# Patient Record
Sex: Female | Born: 1960
Health system: Southern US, Community
[De-identification: ages and names within clinical notes are randomized; demographics above are authoritative.]

## PROBLEM LIST (undated history)

## (undated) DIAGNOSIS — I10 Essential (primary) hypertension: Secondary | ICD-10-CM

## (undated) DIAGNOSIS — M1990 Unspecified osteoarthritis, unspecified site: Secondary | ICD-10-CM

## (undated) DIAGNOSIS — IMO0001 Reserved for inherently not codable concepts without codable children: Secondary | ICD-10-CM

## (undated) DIAGNOSIS — Z8489 Family history of other specified conditions: Secondary | ICD-10-CM

## (undated) DIAGNOSIS — K219 Gastro-esophageal reflux disease without esophagitis: Secondary | ICD-10-CM

## (undated) DIAGNOSIS — K759 Inflammatory liver disease, unspecified: Secondary | ICD-10-CM

## (undated) DIAGNOSIS — C801 Malignant (primary) neoplasm, unspecified: Secondary | ICD-10-CM

## (undated) DIAGNOSIS — J189 Pneumonia, unspecified organism: Secondary | ICD-10-CM

## (undated) DIAGNOSIS — K449 Diaphragmatic hernia without obstruction or gangrene: Secondary | ICD-10-CM

## (undated) DIAGNOSIS — E039 Hypothyroidism, unspecified: Secondary | ICD-10-CM

## (undated) DIAGNOSIS — E785 Hyperlipidemia, unspecified: Secondary | ICD-10-CM

## (undated) DIAGNOSIS — M358 Other specified systemic involvement of connective tissue: Secondary | ICD-10-CM

## (undated) DIAGNOSIS — M797 Fibromyalgia: Secondary | ICD-10-CM

## (undated) DIAGNOSIS — B20 Human immunodeficiency virus [HIV] disease: Secondary | ICD-10-CM

## (undated) DIAGNOSIS — M549 Dorsalgia, unspecified: Secondary | ICD-10-CM

## (undated) HISTORY — DX: Gastro-esophageal reflux disease without esophagitis: K21.9

## (undated) HISTORY — DX: Essential (primary) hypertension: I10

## (undated) HISTORY — PX: PILONIDAL CYST EXCISION: SHX744

## (undated) HISTORY — DX: Diaphragmatic hernia without obstruction or gangrene: K44.9

## (undated) HISTORY — PX: UPPER GASTROINTESTINAL ENDOSCOPY: SHX188

## (undated) HISTORY — PX: TONSILLECTOMY: SUR1361

## (undated) HISTORY — PX: VAGINAL HYSTERECTOMY: SUR661

## (undated) HISTORY — DX: Unspecified osteoarthritis, unspecified site: M19.90

## (undated) HISTORY — DX: Malignant (primary) neoplasm, unspecified: C80.1

## (undated) HISTORY — PX: KNEE SURGERY: SHX244

## (undated) HISTORY — PX: THYROIDECTOMY: SHX17

## (undated) HISTORY — DX: Hyperlipidemia, unspecified: E78.5

## (undated) HISTORY — PX: TOTAL KNEE ARTHROPLASTY: SHX125

---

## 2000-06-07 ENCOUNTER — Other Ambulatory Visit: Admission: RE | Admit: 2000-06-07 | Discharge: 2000-06-07 | Payer: Self-pay | Admitting: Obstetrics & Gynecology

## 2000-06-13 ENCOUNTER — Ambulatory Visit (HOSPITAL_COMMUNITY): Admission: RE | Admit: 2000-06-13 | Discharge: 2000-06-13 | Payer: Self-pay | Admitting: Diagnostic Radiology

## 2000-06-13 ENCOUNTER — Encounter: Payer: Self-pay | Admitting: Diagnostic Radiology

## 2000-06-17 ENCOUNTER — Ambulatory Visit (HOSPITAL_COMMUNITY): Admission: RE | Admit: 2000-06-17 | Discharge: 2000-06-17 | Payer: Self-pay | Admitting: Diagnostic Radiology

## 2001-01-08 ENCOUNTER — Emergency Department (HOSPITAL_COMMUNITY): Admission: EM | Admit: 2001-01-08 | Discharge: 2001-01-08 | Payer: Self-pay | Admitting: Emergency Medicine

## 2001-01-10 ENCOUNTER — Emergency Department (HOSPITAL_COMMUNITY): Admission: EM | Admit: 2001-01-10 | Discharge: 2001-01-10 | Payer: Self-pay | Admitting: Emergency Medicine

## 2001-01-14 ENCOUNTER — Emergency Department (HOSPITAL_COMMUNITY): Admission: EM | Admit: 2001-01-14 | Discharge: 2001-01-14 | Payer: Self-pay | Admitting: Emergency Medicine

## 2001-02-13 ENCOUNTER — Encounter (INDEPENDENT_AMBULATORY_CARE_PROVIDER_SITE_OTHER): Payer: Self-pay | Admitting: *Deleted

## 2001-02-13 ENCOUNTER — Ambulatory Visit (HOSPITAL_BASED_OUTPATIENT_CLINIC_OR_DEPARTMENT_OTHER): Admission: RE | Admit: 2001-02-13 | Discharge: 2001-02-13 | Payer: Self-pay | Admitting: General Surgery

## 2001-04-15 ENCOUNTER — Encounter: Payer: Self-pay | Admitting: Emergency Medicine

## 2001-04-15 ENCOUNTER — Emergency Department (HOSPITAL_COMMUNITY): Admission: EM | Admit: 2001-04-15 | Discharge: 2001-04-15 | Payer: Self-pay | Admitting: Emergency Medicine

## 2001-08-06 ENCOUNTER — Encounter: Payer: Self-pay | Admitting: Emergency Medicine

## 2001-08-06 ENCOUNTER — Emergency Department (HOSPITAL_COMMUNITY): Admission: EM | Admit: 2001-08-06 | Discharge: 2001-08-06 | Payer: Self-pay | Admitting: Emergency Medicine

## 2001-09-27 ENCOUNTER — Other Ambulatory Visit: Admission: RE | Admit: 2001-09-27 | Discharge: 2001-09-27 | Payer: Self-pay | Admitting: Obstetrics & Gynecology

## 2002-01-13 ENCOUNTER — Emergency Department (HOSPITAL_COMMUNITY): Admission: EM | Admit: 2002-01-13 | Discharge: 2002-01-13 | Payer: Self-pay | Admitting: Emergency Medicine

## 2002-06-19 ENCOUNTER — Encounter: Admission: RE | Admit: 2002-06-19 | Discharge: 2002-06-19 | Payer: Self-pay | Admitting: Otolaryngology

## 2002-06-19 ENCOUNTER — Encounter: Payer: Self-pay | Admitting: Otolaryngology

## 2003-01-28 ENCOUNTER — Encounter: Payer: Self-pay | Admitting: Internal Medicine

## 2003-01-28 ENCOUNTER — Inpatient Hospital Stay (HOSPITAL_COMMUNITY): Admission: EM | Admit: 2003-01-28 | Discharge: 2003-01-29 | Payer: Self-pay

## 2003-01-29 ENCOUNTER — Encounter: Payer: Self-pay | Admitting: Cardiology

## 2004-01-09 ENCOUNTER — Other Ambulatory Visit: Admission: RE | Admit: 2004-01-09 | Discharge: 2004-01-09 | Payer: Self-pay | Admitting: Obstetrics and Gynecology

## 2004-01-14 ENCOUNTER — Encounter: Admission: RE | Admit: 2004-01-14 | Discharge: 2004-01-14 | Payer: Self-pay | Admitting: Obstetrics and Gynecology

## 2005-01-28 ENCOUNTER — Other Ambulatory Visit: Admission: RE | Admit: 2005-01-28 | Discharge: 2005-01-28 | Payer: Self-pay | Admitting: Obstetrics and Gynecology

## 2005-07-20 ENCOUNTER — Encounter: Admission: RE | Admit: 2005-07-20 | Discharge: 2005-07-20 | Payer: Self-pay | Admitting: Sports Medicine

## 2006-04-21 ENCOUNTER — Other Ambulatory Visit: Admission: RE | Admit: 2006-04-21 | Discharge: 2006-04-21 | Payer: Self-pay | Admitting: Obstetrics and Gynecology

## 2006-05-05 ENCOUNTER — Encounter: Admission: RE | Admit: 2006-05-05 | Discharge: 2006-05-05 | Payer: Self-pay | Admitting: Obstetrics and Gynecology

## 2006-05-26 ENCOUNTER — Encounter: Admission: RE | Admit: 2006-05-26 | Discharge: 2006-05-26 | Payer: Self-pay | Admitting: Sports Medicine

## 2006-09-13 ENCOUNTER — Encounter: Admission: RE | Admit: 2006-09-13 | Discharge: 2006-09-13 | Payer: Self-pay | Admitting: Sports Medicine

## 2007-06-27 ENCOUNTER — Encounter: Admission: RE | Admit: 2007-06-27 | Discharge: 2007-06-27 | Payer: Self-pay | Admitting: Internal Medicine

## 2007-07-06 ENCOUNTER — Emergency Department (HOSPITAL_COMMUNITY): Admission: EM | Admit: 2007-07-06 | Discharge: 2007-07-06 | Payer: Self-pay | Admitting: Emergency Medicine

## 2007-07-17 ENCOUNTER — Encounter: Admission: RE | Admit: 2007-07-17 | Discharge: 2007-08-25 | Payer: Self-pay | Admitting: Chiropractic Medicine

## 2007-08-11 ENCOUNTER — Encounter: Admission: RE | Admit: 2007-08-11 | Discharge: 2007-08-11 | Payer: Self-pay | Admitting: Sports Medicine

## 2007-08-22 ENCOUNTER — Encounter: Admission: RE | Admit: 2007-08-22 | Discharge: 2007-08-22 | Payer: Self-pay | Admitting: Sports Medicine

## 2007-08-24 ENCOUNTER — Encounter: Admission: RE | Admit: 2007-08-24 | Discharge: 2007-08-24 | Payer: Self-pay | Admitting: Sports Medicine

## 2008-01-17 ENCOUNTER — Encounter: Admission: RE | Admit: 2008-01-17 | Discharge: 2008-01-17 | Payer: Self-pay | Admitting: Sports Medicine

## 2008-01-29 ENCOUNTER — Emergency Department (HOSPITAL_COMMUNITY): Admission: EM | Admit: 2008-01-29 | Discharge: 2008-01-29 | Payer: Self-pay | Admitting: Emergency Medicine

## 2008-05-23 DIAGNOSIS — M1990 Unspecified osteoarthritis, unspecified site: Secondary | ICD-10-CM | POA: Insufficient documentation

## 2008-07-17 ENCOUNTER — Encounter: Admission: RE | Admit: 2008-07-17 | Discharge: 2008-07-17 | Payer: Self-pay | Admitting: Internal Medicine

## 2008-07-31 ENCOUNTER — Ambulatory Visit (HOSPITAL_COMMUNITY): Admission: RE | Admit: 2008-07-31 | Discharge: 2008-07-31 | Payer: Self-pay | Admitting: Internal Medicine

## 2009-07-22 ENCOUNTER — Encounter: Admission: RE | Admit: 2009-07-22 | Discharge: 2009-07-22 | Payer: Self-pay | Admitting: Sports Medicine

## 2009-07-28 ENCOUNTER — Encounter: Admission: RE | Admit: 2009-07-28 | Discharge: 2009-07-28 | Payer: Self-pay | Admitting: Internal Medicine

## 2009-08-28 ENCOUNTER — Encounter: Admission: RE | Admit: 2009-08-28 | Discharge: 2009-08-28 | Payer: Self-pay | Admitting: Sports Medicine

## 2009-09-10 ENCOUNTER — Ambulatory Visit (HOSPITAL_COMMUNITY): Admission: RE | Admit: 2009-09-10 | Discharge: 2009-09-10 | Payer: Self-pay | Admitting: Internal Medicine

## 2009-10-04 ENCOUNTER — Emergency Department (HOSPITAL_COMMUNITY): Admission: EM | Admit: 2009-10-04 | Discharge: 2009-10-05 | Payer: Self-pay | Admitting: Emergency Medicine

## 2010-02-23 ENCOUNTER — Emergency Department (HOSPITAL_COMMUNITY): Admission: EM | Admit: 2010-02-23 | Discharge: 2010-02-23 | Payer: Self-pay | Admitting: Emergency Medicine

## 2010-08-25 ENCOUNTER — Encounter
Admission: RE | Admit: 2010-08-25 | Discharge: 2010-08-25 | Payer: Self-pay | Source: Home / Self Care | Attending: Internal Medicine | Admitting: Internal Medicine

## 2010-09-08 ENCOUNTER — Ambulatory Visit (HOSPITAL_COMMUNITY)
Admission: RE | Admit: 2010-09-08 | Discharge: 2010-09-08 | Payer: Self-pay | Source: Home / Self Care | Attending: Internal Medicine | Admitting: Internal Medicine

## 2010-09-27 ENCOUNTER — Encounter: Payer: Self-pay | Admitting: Sports Medicine

## 2010-11-04 ENCOUNTER — Other Ambulatory Visit: Payer: Self-pay | Admitting: Sports Medicine

## 2010-11-04 DIAGNOSIS — M7120 Synovial cyst of popliteal space [Baker], unspecified knee: Secondary | ICD-10-CM

## 2010-11-10 ENCOUNTER — Ambulatory Visit
Admission: RE | Admit: 2010-11-10 | Discharge: 2010-11-10 | Disposition: A | Discharge status: Home / Self Care | Payer: Medicare Other | Source: Ambulatory Visit | Attending: Sports Medicine | Admitting: Sports Medicine

## 2010-11-10 DIAGNOSIS — M7120 Synovial cyst of popliteal space [Baker], unspecified knee: Secondary | ICD-10-CM

## 2010-11-22 LAB — POCT I-STAT, CHEM 8
BUN: 16 mg/dL (ref 6–23)
Hemoglobin: 13.6 g/dL (ref 12.0–15.0)
Potassium: 3.8 mEq/L (ref 3.5–5.1)
Sodium: 141 mEq/L (ref 135–145)
TCO2: 31 mmol/L (ref 0–100)

## 2010-11-22 LAB — CBC
HCT: 37.8 % (ref 36.0–46.0)
Platelets: 234 10*3/uL (ref 150–400)
RDW: 13.8 % (ref 11.5–15.5)

## 2010-11-22 LAB — DIFFERENTIAL
Basophils Absolute: 0 10*3/uL (ref 0.0–0.1)
Eosinophils Absolute: 0.1 10*3/uL (ref 0.0–0.7)
Eosinophils Relative: 1 % (ref 0–5)
Lymphocytes Relative: 50 % — ABNORMAL HIGH (ref 12–46)
Neutrophils Relative %: 44 % (ref 43–77)

## 2010-11-22 LAB — COMPREHENSIVE METABOLIC PANEL
ALT: 26 U/L (ref 0–35)
AST: 24 U/L (ref 0–37)
Alkaline Phosphatase: 61 U/L (ref 39–117)
CO2: 28 mEq/L (ref 19–32)
Calcium: 9.8 mg/dL (ref 8.4–10.5)
Chloride: 101 mEq/L (ref 96–112)
GFR calc Af Amer: >60 mL/min (ref >60)
GFR calc non Af Amer: >60 mL/min (ref >60)
Glucose, Bld: 91 mg/dL (ref 70–99)
Potassium: 3.9 mEq/L (ref 3.5–5.1)
Sodium: 138 mEq/L (ref 135–145)

## 2010-11-22 LAB — POCT CARDIAC MARKERS: Myoglobin, poc: 50.3 ng/mL (ref 12–200)

## 2011-01-22 NOTE — Consult Note (Signed)
NAME:  Autumn Frye, WOS NO.:  [id number]   MEDICAL RECORD NO.:  [id number]                   PATIENT TYPE:  EMS   LOCATION:  MAJO                                 FACILITY:  [facility]   PHYSICIAN:  Creta Levin, M.D. [facility]      DATE OF BIRTH:  1961/04/24   DATE OF CONSULTATION:  DATE OF DISCHARGE:                                   CONSULTATION   CHIEF COMPLAINT:  Chest pain.   REASON FOR CONSULTATION:  A stress test.   HISTORY OF PRESENT ILLNESS:  Ms. Selvey is a 50 year old woman with  gastroesophageal reflux disease and a hiatal hernia, who complains of  substernal chest pain that radiated to the left jaw and shoulder.  Her  symptoms began approximately one day prior to admission and persisted.  She  describes a moderate amount of chest pressure that did not worsen with  exertion but seemed to get better with rest.  She states that she felt a  slight pressure in her jaw and also a bit of a hot sensation in the left  shoulder.  She had no dyspnea or diaphoresis.  She did state that she felt,  hot.  She did not have fever or coughing.  There was no positional  component to her chest discomfort.  She had no orthopnea or PND, but did  complain of occasional edema, no worse at this time than in the past.   PAST MEDICAL HISTORY:  1. Significant for gastroesophageal reflux disease and a hiatal hernia that     was diagnosed on upper endoscopy approximately one year ago.  2. Thyroid cancer, status post surgical thyroidectomy.  3. Pilonidal cyst.  4. Pneumonia x3.   ALLERGIES:  She states that CIPROFLOXACIN causes hives and that she has  itching with PENICILLIN, SULFA, WELLBUTRIN.   ADMISSION MEDICATIONS:  Synthroid 0.125 mg daily, Tums p.r.n.  She was  scheduled to take Nexium but has not taken it in two to three months due to  her inability to pay for it.   SOCIAL HISTORY:  [place].  She is a 30+  pack-year smoker and currently smokes one to two packs per day.  She does not drink any significant alcohol and she does not exercise.  Her  occupation is a Writer.   FAMILY HISTORY:  Noncontributory.   REVIEW OF SYSTEMS:  Pertinent only for what is mentioned in the HPI.  The  rest is negative.   PHYSICAL EXAMINATION:  VITAL SIGNS:  Temperature 98, blood pressure 108/62,  heart rate 68 and regular, respiratory rate 16.  She was saturating 96% on  room air.  GENERAL:  A well-developed young woman in no acute distress.  HEENT:  Unremarkable.  NECK:  Revealed a surgical scar without thyromegaly.  She had normal jugular  venous wave-forms and normal carotid upstroke without bruits.  CARDIOVASCULAR:  Regular  rate and rhythm with a normal S1, normal S2, no  gallop, no murmur.  LUNGS:  Crackles one-third up on the right, but did clear with cough.  She  had no wheezing.  ABDOMEN:  Soft, nondistended, nontender.  There was no epigastric tenderness  or reproduction of her symptoms with deep palpation of the epigastrium.  GENITOURINARY:  Deferred.  RECTAL:  Deferred.  EXTREMITIES:  No edema and good distal pulses.  NEUROLOGIC:  Nonfocal.   STUDIES:  Chest x-ray is pending.  ECG revealed normal sinus rhythm with no  evidence of ischemia.   LABORATORY DATA:  White count 9.4, hemoglobin 13.8, hematocrit 41, platelets  260.  Sodium 137, potassium 4.2, chloride 102, bicarbonate 28.  BUN and  creatinine are pending, glucose 99, total bilirubin 1.0.  AST and ALT 35 and  25 respectively, alkaline phosphatase 63.  CK 185, MB 3.3 and troponin I was  less than 0.01.   ASSESSMENT/PLAN:  Chest discomfort.  This is a bit atypical for cardiac  chest pain, but it is peculiar that she had radiation to the left upper  extremity and jaw and that she had relief with nitroglycerin.  She was  started on Lovenox by Dr. Lovell Sheehan, which is an excellent choice.  She  probably does not need  a CB3A inhibitor at this time unless she develops  worsened symptoms or positive bio-markers or ECG changes.  We recommend  aspirin 81 mg a day.  We can wean the nitroglycerin as tolerated.  We can  consider a beta blocker, but may do better holding this until after her  stress test.  We can proceed with an exercise Cardiolite in the a.m. and the  patient feels that she can walk a standard Bruce Protocol with no  difficulty.   Obviously, Ms. Gillentine knows that she needs to quit smoking.  We discussed  this at length.  She said she has tried Wellbutrin in the past but that this  causes itching.  She very likely needs a multidisciplinary approach  including some form of an SSRI, nicotine replacement, and psychological  support.  Finally, with respect to her gastroesophageal reflux, it would be  helpful to get her back on a proton pump inhibitor and to have a less  expensive alternative for her, so that she can remain compliant.                                                Creta Levin, M.D. [facility]    RPK/MEDQ  D:  01/28/2003  T:  01/28/2003  Job:  [id number]

## 2011-01-22 NOTE — Op Note (Signed)
[place]  Patient:    Autumn Frye, Autumn Frye                        MRN: [id number] Proc. Date: 02/13/01 Adm. Date:  [id number] Attending:  Caleen Essex                           Operative Report  PREOPERATIVE DIAGNOSIS:  Pilonidal sinus tract.  POSTOPERATIVE DIAGNOSIS:  Pilonidal sinus tract.  PROCEDURE:  Excision of pilonidal sinus tract.  SURGEON:  Ollen Gross. Vernell Morgans, M.D.  ANESTHESIA:  General endotracheal.  DESCRIPTION OF PROCEDURE:  After informed consent was obtained, the patient was brought to the operating room and placed in the supine position on the operating room table.  After adequate induction of general anesthesia, the patient was moved into a prone position, taking care to pad all pressure points.  The patients sacral area was then prepped with Betadine and draped in the usual sterile manner.  A 15 blade knife was used to excise the pilonidal cyst and sinus tract, trying to keep the incision on the left side of the midline of the gluteal cleft.  Once this was done, the depth of the wound was probed with a small blunt probe, and the complete depth of the sinus tract was opened until no further openings could be found.  The wound was then fulgurated with the Bovie electrocautery and was hemostatic.  The wound was packed with gauze and a sterile dressing applied.  The patient tolerated the procedure well.  At the end of the case, all needle, sponge, and instrument counts were correct.  The patient was then moved into a supine position on the stretcher and extubated without difficulty.  She was taken to the recovery room in stable condition. DD:  02/13/01 TD:  02/13/01 Job: [id number]

## 2011-01-22 NOTE — Discharge Summary (Signed)
NAME:  Autumn Frye, Autumn Frye NO.:  [id number]   MEDICAL RECORD NO.:  [id number]                   PATIENT TYPE:  INP   LOCATION:  2019                                 FACILITY:  [facility]   PHYSICIAN:  Rene Paci, M.D. [facility]          DATE OF BIRTH:  [birth year]   DATE OF ADMISSION:  01/27/2003  DATE OF DISCHARGE:  01/29/2003                                 DISCHARGE SUMMARY   DISCHARGE DIAGNOSES:  1. Chest pain.  2. Hypothyroidism.  3. Tobacco use.  4. Gastrointestinal reflux disease.   BRIEF ADMISSION HISTORY:  Autumn Frye is a 50 year old white female who  presented with complaints of substernal chest pain that radiated to the left  jaw and shoulder.  Her symptoms began approximately one day prior to  admission.   PAST MEDICAL HISTORY:  1. Gastrointestinal reflux disease and hiatal hernia diagnosed by endoscopy     one year ago.  2. History of thyroid cancer, status post surgical thyroidectomy on hormone     replacement.  3. History of pilonidal cyst.  4. History of pneumonia x 3.   HOSPITAL COURSE:  #1 - CARDIOVASCULAR:  The patient was admitted with chest  pain.  She was admitted to rule out myocardial infarction.  Cardiology saw  the patient.  They felt she had minimal cardiac risk factors.  They noted  that her EKG was negative for ischemia.  Cardiac enzymes were negative.  She  did rule out for MI.  She did have a treadmill Cardiolite which was negative  for ischemia.  There was no indication for further cardiac workup.   #2 - ENDOCRINE:  The patient has a history of thyroid cancer, status post  surgical thyroidectomy.  The patient was on Synthroid 125 mcg prior to  admission.  Her TSH was elevated at 14.526.  She states that approximately a  year ago her Synthroid was reduced from 150 mcg.  She states she had taken  150 mcg for almost 10 years.  She states that she has really not felt as  well since her Synthroid was reduced a year ago.  We  will resume the  patient's previous dose of 150 mcg.  She will need followup TSH in about  four weeks.   #3 - GASTROINTESTINAL:  As noted, the patient has a history of  gastrointestinal reflux disease and hiatal hernia by endoscopy a year ago.  The patient states she has not taken her proton pump inhibitor for three or  four months secondary to cost.  She was willing to resume her Nexium for a  30-day trial period.   #4 - PULMONARY:  The patient has a 30-pack-year history of tobacco.  She is  interested in smoking cessation.  She states that her biggest concern is  that it will effect her personality.  She had attempted to quit in the past  and she states she did not  like the way she acted.  She states that she is  mean.  The patient states that she has taken Wellbutrin in the past, but  seems to have an allergy to Wellbutrin.  Wellbutrin causes her to itch.  The  patient is interested in smoking cessation, so we have given her 60 tablets  of Xanax in efforts to help her with the anxiety from the smoking cessation.  She also knows that Dr. Debby Bud is another resource if she needs further  assistance with smoking cessation.   LABORATORIES AT DISCHARGE:  TSH 14.526.  CMET normal.  Cardiac enzymes  negative.  Coagulations were normal.  LFTs were normal.  CBC was normal.  Chest x-ray was negative.   MEDICATIONS AT DISCHARGE:  1. Synthroid 150 mcg daily.  2. Nexium 40 mg daily.  3. Xanax 0.25 mg one to two tablets q.8h. p.r.n.   FOLLOWUP:  The patient is to see Dr. Debby Bud in one to two weeks.     Cornell Barman, P.A. [facility]                  Rene Paci, M.D. [facility]    LC/MEDQ  D:  01/29/2003  T:  01/29/2003  Job:  [id number]

## 2011-01-26 ENCOUNTER — Encounter (HOSPITAL_COMMUNITY)
Admission: RE | Admit: 2011-01-26 | Discharge: 2011-01-26 | Disposition: A | Discharge status: Home / Self Care | Payer: Medicare Other | Source: Ambulatory Visit | Attending: Orthopedic Surgery | Admitting: Orthopedic Surgery

## 2011-01-26 LAB — URINALYSIS, ROUTINE W REFLEX MICROSCOPIC
Glucose, UA: NEGATIVE mg/dL
pH: 6 (ref 5.0–8.0)

## 2011-01-26 LAB — COMPREHENSIVE METABOLIC PANEL
Albumin: 3.6 g/dL (ref 3.5–5.2)
Alkaline Phosphatase: 65 U/L (ref 39–117)
BUN: 12 mg/dL (ref 6–23)
GFR calc Af Amer: >60 mL/min (ref >60)
Potassium: 4.3 mEq/L (ref 3.5–5.1)
Sodium: 140 mEq/L (ref 135–145)
Total Protein: 6.4 g/dL (ref 6.0–8.3)

## 2011-01-26 LAB — APTT: aPTT: 30 seconds (ref 24–37)

## 2011-01-26 LAB — CBC
HCT: 37 % (ref 36.0–46.0)
Hemoglobin: 12.4 g/dL (ref 12.0–15.0)
RBC: 4.11 MIL/uL (ref 3.87–5.11)
RDW: 14.4 % (ref 11.5–15.5)
WBC: 6.7 10*3/uL (ref 4.0–10.5)

## 2011-01-26 LAB — SURGICAL PCR SCREEN: MRSA, PCR: NEGATIVE

## 2011-01-26 LAB — PROTIME-INR: INR: 0.89 (ref 0.00–1.49)

## 2011-02-03 ENCOUNTER — Inpatient Hospital Stay (HOSPITAL_COMMUNITY)
Admission: RE | Admit: 2011-02-03 | Discharge: 2011-02-05 | DRG: 470 | Disposition: A | Discharge status: Skilled Nursing Facility | Payer: Medicare Other | Source: Ambulatory Visit | Attending: Orthopedic Surgery | Admitting: Orthopedic Surgery

## 2011-02-03 ENCOUNTER — Inpatient Hospital Stay (HOSPITAL_COMMUNITY): Payer: Medicare Other

## 2011-02-03 DIAGNOSIS — M069 Rheumatoid arthritis, unspecified: Secondary | ICD-10-CM | POA: Diagnosis present

## 2011-02-03 DIAGNOSIS — Z880 Allergy status to penicillin: Secondary | ICD-10-CM

## 2011-02-03 DIAGNOSIS — Z888 Allergy status to other drugs, medicaments and biological substances status: Secondary | ICD-10-CM

## 2011-02-03 DIAGNOSIS — I10 Essential (primary) hypertension: Secondary | ICD-10-CM | POA: Diagnosis present

## 2011-02-03 DIAGNOSIS — E039 Hypothyroidism, unspecified: Secondary | ICD-10-CM | POA: Diagnosis present

## 2011-02-03 DIAGNOSIS — Z882 Allergy status to sulfonamides status: Secondary | ICD-10-CM

## 2011-02-03 DIAGNOSIS — Z85850 Personal history of malignant neoplasm of thyroid: Secondary | ICD-10-CM

## 2011-02-03 DIAGNOSIS — M1710 Unilateral primary osteoarthritis, unspecified knee: Principal | ICD-10-CM | POA: Diagnosis present

## 2011-02-03 LAB — TYPE AND SCREEN

## 2011-02-03 LAB — ABO/RH: ABO/RH(D): A POS

## 2011-02-04 LAB — CBC
MCH: 30.1 pg (ref 26.0–34.0)
MCV: 91 fL (ref 78.0–100.0)
Platelets: 245 10*3/uL (ref 150–400)
RDW: 14.8 % (ref 11.5–15.5)
WBC: 9.3 10*3/uL (ref 4.0–10.5)

## 2011-02-04 LAB — BASIC METABOLIC PANEL
BUN: 9 mg/dL (ref 6–23)
Calcium: 8.4 mg/dL (ref 8.4–10.5)
Creatinine, Ser: 0.49 mg/dL (ref 0.4–1.2)
GFR calc non Af Amer: >60 mL/min (ref >60)
Potassium: 4.1 mEq/L (ref 3.5–5.1)

## 2011-02-05 LAB — PROTIME-INR: Prothrombin Time: 19.3 seconds — ABNORMAL HIGH (ref 11.6–15.2)

## 2011-02-05 LAB — CBC
Platelets: 222 10*3/uL (ref 150–400)
RBC: 3.13 MIL/uL — ABNORMAL LOW (ref 3.87–5.11)
WBC: 8.9 10*3/uL (ref 4.0–10.5)

## 2011-02-05 LAB — BASIC METABOLIC PANEL
Calcium: 8.8 mg/dL (ref 8.4–10.5)
Creatinine, Ser: 0.54 mg/dL (ref 0.4–1.2)
GFR calc Af Amer: >60 mL/min (ref >60)
GFR calc non Af Amer: >60 mL/min (ref >60)

## 2011-02-16 ENCOUNTER — Other Ambulatory Visit: Payer: Self-pay | Admitting: Orthopedic Surgery

## 2011-02-16 ENCOUNTER — Other Ambulatory Visit: Payer: Self-pay | Admitting: *Deleted

## 2011-02-16 ENCOUNTER — Encounter (INDEPENDENT_AMBULATORY_CARE_PROVIDER_SITE_OTHER): Payer: Medicare Other | Admitting: *Deleted

## 2011-02-16 DIAGNOSIS — M79609 Pain in unspecified limb: Secondary | ICD-10-CM

## 2011-02-17 ENCOUNTER — Encounter: Payer: Self-pay | Admitting: Orthopedic Surgery

## 2011-02-25 NOTE — Discharge Summary (Signed)
NAME:  Autumn Frye, Autumn Frye NO.:  [id number]  MEDICAL RECORD NO.:  [id number]           PATIENT TYPE:  I  LOCATION:  5011                         FACILITY:  [facility]  PHYSICIAN:  Loreta Ave, M.D. DATE OF BIRTH:  [birth year]  DATE OF ADMISSION:  02/03/2011 DATE OF DISCHARGE:  02/05/2011                              DISCHARGE SUMMARY   FINAL DIAGNOSES: 1. Status post right total knee replacement for end-stage degenerative     joint disease. 2. End-stage degenerative joint disease left knee. 3. Rheumatoid arthritis. 4. Hypothyroid. 5. Hypertension. 6. History of thyroid cancer.  HISTORY OF PRESENT ILLNESS:  A 50 year old white female history of end- stage DJD, right greater than left knees presented to our office for preop evaluation for right total knee replacement and left knee intra- articular Marcaine/Depo-Medrol injection.  She had progressive worsening pain with no response with conservative treatment.  She had a decrease in her daily activities due to the ongoing complaint.  HOSPITAL COURSE:  On Feb 03, 2011 the patient was taken to the [facility] OR and a right total knee replacement and left knee intra-articular Marcaine/Depo-Medrol injection performed.  SURGEON:  Mckinley Jewel, MD.  ASSISTANT:  Zonia Kief PA-C.  ANESTHESIA:  General.  MINIMAL BLOOD:  Loss.  TOURNIQUET TIME:  79 minutes.  One Hemovac drain placed.  There were no surgical or anesthesia complications and the patient was transferred to recovery in stable condition.  When transferred to the orthopedic unit pharmacy protocol Coumadin and Lovenox started for DVT prophylaxis.  Feb 04, 2011 the patient complained of right knee pain.  Also has nausea and itching likely from morphine PCA.  Temperature 99.7, pulse 74, respirations 18, blood pressure 108/40.  Dressing clean, dry, and intact.  Calf nontender and neurovascularly intact.  Skin warm and dry. Arranging transfer to a [facility] for rehab.  Discontinued PCA and started on Percocet.  PT/OT consults.  February 05, 2011 the patient doing well and pain better control.  She is ready for transfer to a skilled center.  Temperature 98.6, pulse 89, respirations 18, blood pressure 118/76.  WBC 8.9, hematocrit 28.5, hemoglobin 9.2, platelets 222.  Sodium 138, potassium 3.7, chloride 100, CO2 34, BUN 6, creatinine 0.54, glucose 95, INR 1.61.  Right knee wound looks good and staples intact.  No drainage or sign of infection.  Hemovac drain removed.  New dressing applied.  Calf nontender  and neurovascularly intact.  Skin warm and dry.  The patient is stable and ready for transfer today.  DISCHARGE MEDICATIONS: 1. Percocet 10/325 1-2 tablets p.o. q. 4-6 hours p.r.n. for pain. 2. Robaxin 500 mg 1 tablet p.o. q.6 h p.r.n. for spasms. 3. Lovenox 30 mg one subcu injection b.i.d. and discontinue when     Coumadin is therapeutic with INR 2-3. 4. Coumadin pharmacy protocol.  Maintain INR 2-3 x4 weeks postop for     DVT prophylaxis. 5. Methotrexate 250 mg one subcu injection q. weekly. 6. Loratadine 10 mg 1 tablet p.o. daily. 7. Lasix 40 mg 1 tablet p.o. daily. 8. Levothyroxine 150 mcg 1 tablet p.o. daily.  CONDITION:  Good and stable.  DISPOSITION:  Transfer to [facility].  INSTRUCTIONS:  While at the facility the patient will continue to work with PT and OT to improve ambulation and right knee range of motion and strengthening.  CPM start at 0-60 degrees 6-8 hours daily and increase range of motion range 10 degrees each day.  Daily dressing changes with 4x4 gauze and can pull TED hose over this.  She is to weightbearing as tolerated with walker and wean to cane as tolerated.  Okay to shower, but no tub soaking.  Do not apply any creams or ointment to her incision.  Coumadin x4 weeks postop DVT prophylaxis.  Discontinue Lovenox when Coumadin is therapeutic.  Is to follow up in office with Dr.  Eulah Pont when she is 2 weeks postop for recheck and we will remove knee staples at that time.  Please call our office at [phone] for any questions or concerns.     Genene Churn. Denton Meek.   ______________________________ Loreta Ave, M.D.    JMO/MEDQ  D:  02/05/2011  T:  02/05/2011  Job:  [id number]  Electronically Signed by Zonia Kief P.A. on 02/17/2011 02:01:19 PM Electronically Signed by Mckinley Jewel M.D. on 02/25/2011 04:06:32 PM

## 2011-02-25 NOTE — Op Note (Signed)
NAME:  Autumn Frye, Autumn Frye NO.:  [id number]  MEDICAL RECORD NO.:  [id number]           PATIENT TYPE:  I  LOCATION:  5011                         FACILITY:  [facility]  PHYSICIAN:  Loreta Ave, M.D. DATE OF BIRTH:  12-Jan-1961  DATE OF PROCEDURE:  02/03/2011 DATE OF DISCHARGE:                              OPERATIVE REPORT   PREOPERATIVE DIAGNOSES: 1. Right knee end-stage degenerative arthritis, most marked     patellofemoral joint with erosive changes of patellofemoral joint.     Underlying rheumatoid arthritis.  Previous patellar tendon     realignment procedure. 2. Left knee end-stage degenerative arthritis.  Most marked     patellofemoral joint.  Previous distal patellofemoral realignment     procedure.  Underlying rheumatoid arthritis.  POSTOPERATIVE DIAGNOSES: 1. Right knee end-stage degenerative arthritis, most marked     patellofemoral joint with erosive changes of patellofemoral joint.     Underlying rheumatoid arthritis.  Previous patellar tendon     realignment procedure. 2. Left knee end-stage degenerative arthritis.  Most marked     patellofemoral joint.  Previous distal patellofemoral realignment     procedure.  Underlying rheumatoid arthritis without evidence of     significant acute inflammatory changes, right knee.  PROCEDURE: 1. Right knee modified minimally invasive total knee replacement,     Stryker triathlon prosthesis.  Cemented pegged posterior stabilized     #3 femoral component.  Cemented #3 tibial component 9-mm     polyethylene insert.  Resurfacing cemented pegged medial offset 32-     mm patellar component.  Soft tissue balancing. 2. Left knee intraarticular injection Depo-Medrol and Marcaine.  SURGEON:  Loreta Ave, MD  ASSISTANT:  Genene Churn. Barry Dienes, [place], present throughout the entire case as necessary for timely completion of procedure.  ANESTHESIA:  General  BLOOD LOSS:  Minimal.  SPECIMENS:  None.  COUNTS:   None.  COMPLICATIONS:  None.  DRESSING:  Soft compressive knee immobilizer on the right.  DRAIN:  Hemovac x1.  Tourniquet time on the right 1 hour 15 minutes.  PROCEDURE IN DETAILS:  The patient was brought to the operating room, placed on the operating room table in supine position.  After adequate anesthesia has been obtained, both knees were examined.  She has good extension on both sides.  She has marked patella compression on both sides.  Relatively good alignment.  Flexion better 100 degrees both sides.  Under sterile technique, left knee was injected intra- articularly with Depo-Medrol and Marcaine.  Attention turned to the right knee.  Tourniquet applied.  Prepped and draped in usual sterile fashion. Exsanguinated with elevation of Esmarch.  Tourniquet was inflated to 350 mmHg.  She had a previous smile incision years ago for the patellar tendon realignment procedure.  I made a straight incision top to bottom crossing the distal incision just slightly.  Skin and subcutaneous tissue divided.  Medial arthrotomy vastus splitting at the top.  At the bottom I had to partially transect through the portion of patellar tendon that had brought over transversely from lateral to medial.  This was still left intact to the attachment  on the tibia.  Knee exposed. Marked extensive erosive changes on the medial trochlea, medial patella. She also had advancing changes in the other compartments.  No acute inflammatory changes.  Remnants of menisci, cruciate ligament, spurs removed.  Distal femur exposed.  Intramedullary guide placed.  A 10-mm resection set at 5 degrees of valgus.  Using epicondylar axis, the femur was sized, cut and fitted for posterior stabilize #3 femoral component. Extramedullary guide on the tibia, three degrees posterior slope cut. Sufficient resection for 9-mm insert.  Debris cleared throughout the knee.  Patella was then exposed and I removed the posterior 8-9  mm. There was marked erosive changes on the medial side but I was able to get reasonable flat surface and still looked good 10-11 mm thickness of patella.  Size drilled and fitted for a 32-mm component.  Debris cleared throughout the knee.  Trials put in place.  With the #3 components above and below on the 9-mm insert and the 32 on the patella, I had a good mechanical axis, good stability in flexion and extension, good patellar tracking and a good balanced knee in flexion and extension.  Tibia was marked for rotation with trials and hand reamed.  All trials removed. Copious irrigation with pulse irrigating device.  Cement prepared and placed on all components, firmly seated.  Polyethylene attached to tibia and knee reduced.  Patella with a clamp.  At completion, I was again pleased of mechanical axis alignment, stability and tracking.  Wound was irrigated.  The arthrotomy was closed with #1 Vicryl.  At the distal end, we did reapproximate the portion of patellar tendon that has gone over medially.  This was closed a little bit looser on that side to prevent the excessive medial patellofemoral compression that she had as a result of that previous procedure.  At completion, the overall extensor mechanism was intact and closed sufficiently.  I was pleased with tracking and pleased with correction so I would not get the recurrent medial patellofemoral overload.  Remaining wound had been closed with Vicryl and staples.  Hemovac drain had been placed and brought through a separate stab wound.  Once the wound was closed, sterile compressive dressing applied.  A tourniquet inflated was removed.  Knee immobilizer was applied.  Anesthesia was reversed. Brought to recovery room.  Tolerated the surgery well.  No complications.     Loreta Ave, M.D.     DFM/MEDQ  D:  02/03/2011  T:  02/04/2011  Job:  [id number]  Electronically Signed by Mckinley Jewel M.D. on 02/25/2011 04:06:30 PM

## 2011-06-02 LAB — COMPREHENSIVE METABOLIC PANEL
AST: 22
Alkaline Phosphatase: 56
BUN: 12
CO2: 31
Chloride: 106
Creatinine, Ser: 0.78
GFR calc Af Amer: >60
GFR calc non Af Amer: >60
Potassium: 4.5
Total Bilirubin: 0.6

## 2011-06-02 LAB — DIFFERENTIAL
Basophils Absolute: 0
Basophils Relative: 0
Eosinophils Relative: 1
Lymphocytes Relative: 41

## 2011-06-02 LAB — D-DIMER, QUANTITATIVE: D-Dimer, Quant: 0.26

## 2011-06-02 LAB — CBC
Platelets: 262
RBC: 4.49
WBC: 7.8

## 2011-06-02 LAB — PROTIME-INR: Prothrombin Time: 11.9

## 2011-06-02 LAB — POCT CARDIAC MARKERS
CKMB, poc: 1.2
CKMB, poc: 1.4
Myoglobin, poc: 39.2
Operator id: 294501
Troponin i, poc: <0.05
Troponin i, poc: <0.05

## 2011-06-16 LAB — URINALYSIS, ROUTINE W REFLEX MICROSCOPIC
Bilirubin Urine: NEGATIVE
Ketones, ur: NEGATIVE
Nitrite: NEGATIVE
Protein, ur: NEGATIVE
Specific Gravity, Urine: 1.016
Urobilinogen, UA: 1

## 2011-09-06 ENCOUNTER — Other Ambulatory Visit: Payer: Self-pay | Admitting: Internal Medicine

## 2011-09-06 DIAGNOSIS — Z1231 Encounter for screening mammogram for malignant neoplasm of breast: Secondary | ICD-10-CM

## 2011-09-09 ENCOUNTER — Other Ambulatory Visit (HOSPITAL_COMMUNITY): Payer: Self-pay | Admitting: Internal Medicine

## 2011-09-09 ENCOUNTER — Ambulatory Visit (HOSPITAL_COMMUNITY)
Admission: RE | Admit: 2011-09-09 | Discharge: 2011-09-09 | Disposition: A | Discharge status: Home / Self Care | Payer: Medicare Other | Source: Ambulatory Visit | Attending: Internal Medicine | Admitting: Internal Medicine

## 2011-09-09 DIAGNOSIS — I10 Essential (primary) hypertension: Secondary | ICD-10-CM | POA: Insufficient documentation

## 2011-09-09 DIAGNOSIS — F17200 Nicotine dependence, unspecified, uncomplicated: Secondary | ICD-10-CM | POA: Insufficient documentation

## 2011-09-15 ENCOUNTER — Inpatient Hospital Stay: Admit: 2011-09-15 | Payer: Self-pay | Admitting: Orthopedic Surgery

## 2011-09-15 SURGERY — ARTHROPLASTY, KNEE, TOTAL
Anesthesia: General | Laterality: Left

## 2011-09-28 ENCOUNTER — Ambulatory Visit
Admission: RE | Admit: 2011-09-28 | Discharge: 2011-09-28 | Disposition: A | Discharge status: Home / Self Care | Payer: Medicare Other | Source: Ambulatory Visit | Attending: Internal Medicine | Admitting: Internal Medicine

## 2011-09-28 DIAGNOSIS — Z1231 Encounter for screening mammogram for malignant neoplasm of breast: Secondary | ICD-10-CM

## 2011-09-29 ENCOUNTER — Ambulatory Visit (AMBULATORY_SURGERY_CENTER): Payer: Medicare Other | Admitting: *Deleted

## 2011-09-29 ENCOUNTER — Encounter: Payer: Self-pay | Admitting: Gastroenterology

## 2011-09-29 VITALS — Ht 62.0 in | Wt 180.9 lb

## 2011-09-29 DIAGNOSIS — Z1211 Encounter for screening for malignant neoplasm of colon: Secondary | ICD-10-CM

## 2011-09-29 MED ORDER — PEG-KCL-NACL-NASULF-NA ASC-C 100 G PO SOLR: 1.0000 | Freq: Once | ORAL | Status: DC

## 2011-09-29 NOTE — Progress Notes (Signed)
No allergy to eggs or soy products 

## 2011-10-01 ENCOUNTER — Other Ambulatory Visit: Payer: Self-pay | Admitting: Internal Medicine

## 2011-10-01 DIAGNOSIS — R928 Other abnormal and inconclusive findings on diagnostic imaging of breast: Secondary | ICD-10-CM

## 2011-10-06 ENCOUNTER — Ambulatory Visit
Admission: RE | Admit: 2011-10-06 | Discharge: 2011-10-06 | Disposition: A | Discharge status: Home / Self Care | Payer: Medicare Other | Source: Ambulatory Visit | Attending: Internal Medicine | Admitting: Internal Medicine

## 2011-10-06 DIAGNOSIS — R928 Other abnormal and inconclusive findings on diagnostic imaging of breast: Secondary | ICD-10-CM

## 2011-10-13 ENCOUNTER — Other Ambulatory Visit: Payer: Medicare Other | Admitting: Gastroenterology

## 2011-10-13 ENCOUNTER — Encounter: Payer: Self-pay | Admitting: Gastroenterology

## 2011-10-13 ENCOUNTER — Ambulatory Visit (AMBULATORY_SURGERY_CENTER): Payer: Medicare Other | Admitting: Gastroenterology

## 2011-10-13 DIAGNOSIS — Z1211 Encounter for screening for malignant neoplasm of colon: Secondary | ICD-10-CM

## 2011-10-13 DIAGNOSIS — D126 Benign neoplasm of colon, unspecified: Secondary | ICD-10-CM

## 2011-10-13 DIAGNOSIS — K633 Ulcer of intestine: Secondary | ICD-10-CM

## 2011-10-13 DIAGNOSIS — K6389 Other specified diseases of intestine: Secondary | ICD-10-CM

## 2011-10-13 MED ORDER — SODIUM CHLORIDE 0.9 % IV SOLN: 500.0000 mL | INTRAVENOUS | Status: DC

## 2011-10-13 NOTE — Patient Instructions (Signed)
Discharge instructions given with verbal understanding.  Handouts on polyps and diverticulosis given.  Resume previous medications. 

## 2011-10-13 NOTE — Progress Notes (Signed)
Patient did not experience any of the following events: a burn prior to discharge; a fall within the facility; wrong site/side/patient/procedure/implant event; or a hospital transfer or hospital admission upon discharge from the facility. (G8907) Patient did not have preoperative order for IV antibiotic SSI prophylaxis. (G8918)  

## 2011-10-13 NOTE — Op Note (Signed)
[facility] [address] [place], [place]  [address]  COLONOSCOPY PROCEDURE REPORT  PATIENT:  Autumn Frye, Autumn Frye  MR#:  [id number] BIRTHDATE:  02-21-1961, 50 yrs. old  GENDER:  female ENDOSCOPIST:  Judie Petit T. Russella Dar, MD, [facility] Referred by:  Lucky Cowboy, M.D. PROCEDURE DATE:  10/13/2011 PROCEDURE:  Colonoscopy with biopsy and snare polypectomy ASA CLASS:  Class II INDICATIONS:  1) Routine Risk Screening MEDICATIONS:   MAC sedation, administered by CRNA, propofol (Diprivan) 350 mg IV DESCRIPTION OF PROCEDURE:   After the risks benefits and alternatives of the procedure were thoroughly explained, informed consent was obtained.  Digital rectal exam was performed and revealed no abnormalities.   The LB CF-[id number] endoscope was introduced through the anus and advanced to the cecum, which was identified by both the appendix and ileocecal valve, without limitations.  The quality of the prep was excellent, using MoviPrep.  The instrument was then slowly withdrawn as the colon was fully examined. <<PROCEDUREIMAGES>> FINDINGS:  Erosions were found at the ileocecal valve. They were erythematous. Multiple biopsies were obtained and sent to pathology.  A sessile polyp was found in the mid transverse colon. It was 6 mm in size. Polyp was snared without cautery. Retrieval was successful. A sessile polyp was found in the descending colon. It was 6 mm in size. Polyp was snared without cautery. Retrieval was successful. Four polyps were found in the rectum and sigmoid colon. They were 5 mm in size. Polyps were snared without cautery. Retrieval was successful. Mild diverticulosis was found in the sigmoid colon.  Otherwise normal colonoscopy without other polyps, masses, vascular ectasias, or inflammatory changes.  Retroflexed views in the rectum revealed no abnormalities.   The time to cecum =  2  minutes. The scope was then withdrawn (time =  13.25  min) from the patient and the  procedure completed.  COMPLICATIONS:  None  ENDOSCOPIC IMPRESSION: 1) Erosions at the ileocecal valve 2) 6 mm sessile polyp in the mid transverse colon 3) 6 mm sessile polyp in the descending colon 4) 5 mm Four polyps in the rectum and sigmoid colon 5) Mild diverticulosis in the sigmoid colon  RECOMMENDATIONS: 1) Await pathology results 2) High fiber diet with liberal fluid intake. 3) If the polyps are adenomatous (pre-cancerous), repeat colonoscopy in 5 years. Otherwise follow colorectal cancer screening guidelines for "routine risk" patients with colonoscopy in 10 years.  Venita Lick. Russella Dar, MD, [facility]  n. eSIGNED:   Venita Lick. Ishan Sanroman at 10/13/2011 02:00 PM  Laurice Record, [id number]

## 2011-10-14 ENCOUNTER — Telehealth: Payer: Self-pay | Admitting: *Deleted

## 2011-10-14 NOTE — Telephone Encounter (Signed)
  Follow up Call-  Call back number 10/13/2011  Post procedure Call Back phone  # [phone]  Permission to leave phone message Yes     Patient questions:  Do you have a fever, pain , or abdominal swelling? no Pain Score  0 *  Have you tolerated food without any problems? yes  Have you been able to return to your normal activities? yes  Do you have any questions about your discharge instructions: Diet   no Medications  no Follow up visit  no  Do you have questions or concerns about your Care? no  Actions: * If pain score is 4 or above: No action needed, pain <4.

## 2011-10-18 ENCOUNTER — Encounter: Payer: Self-pay | Admitting: Gastroenterology

## 2011-12-07 ENCOUNTER — Ambulatory Visit (HOSPITAL_COMMUNITY)
Admission: RE | Admit: 2011-12-07 | Discharge: 2011-12-07 | Disposition: A | Discharge status: Home / Self Care | Payer: Medicare Other | Source: Ambulatory Visit | Attending: Physician Assistant | Admitting: Physician Assistant

## 2011-12-07 ENCOUNTER — Other Ambulatory Visit (HOSPITAL_COMMUNITY): Payer: Self-pay | Admitting: Physician Assistant

## 2011-12-07 DIAGNOSIS — M545 Low back pain, unspecified: Secondary | ICD-10-CM | POA: Insufficient documentation

## 2011-12-07 DIAGNOSIS — M25559 Pain in unspecified hip: Secondary | ICD-10-CM | POA: Insufficient documentation

## 2011-12-07 DIAGNOSIS — M25551 Pain in right hip: Secondary | ICD-10-CM

## 2011-12-07 DIAGNOSIS — M25552 Pain in left hip: Secondary | ICD-10-CM

## 2011-12-07 DIAGNOSIS — IMO0002 Reserved for concepts with insufficient information to code with codable children: Secondary | ICD-10-CM | POA: Insufficient documentation

## 2011-12-14 ENCOUNTER — Other Ambulatory Visit: Payer: Self-pay | Admitting: Orthopedic Surgery

## 2011-12-14 DIAGNOSIS — M7120 Synovial cyst of popliteal space [Baker], unspecified knee: Secondary | ICD-10-CM

## 2011-12-15 ENCOUNTER — Ambulatory Visit
Admission: RE | Admit: 2011-12-15 | Discharge: 2011-12-15 | Disposition: A | Discharge status: Home / Self Care | Payer: Medicare Other | Source: Ambulatory Visit | Attending: Orthopedic Surgery | Admitting: Orthopedic Surgery

## 2011-12-15 DIAGNOSIS — M7120 Synovial cyst of popliteal space [Baker], unspecified knee: Secondary | ICD-10-CM

## 2012-05-15 ENCOUNTER — Ambulatory Visit (INDEPENDENT_AMBULATORY_CARE_PROVIDER_SITE_OTHER): Payer: Medicare Other | Admitting: General Surgery

## 2012-05-15 ENCOUNTER — Encounter (INDEPENDENT_AMBULATORY_CARE_PROVIDER_SITE_OTHER): Payer: Self-pay | Admitting: General Surgery

## 2012-05-15 VITALS — BP 128/84 | Temp 97.0°F | Resp 16 | Ht 63.0 in | Wt 180.2 lb

## 2012-05-15 DIAGNOSIS — M533 Sacrococcygeal disorders, not elsewhere classified: Secondary | ICD-10-CM

## 2012-05-15 NOTE — Progress Notes (Signed)
Subjective:     Patient ID: Autumn Frye, female   DOB: 1961-05-15, 51 y.o.   MRN: [id number]  HPI Hx of pilonidal cystectomy with Dr. Carolynne Edouard in 2002. She has done well from this until last week when she began having some "pressure" in the area of her tailbone and prior pilonidal cystectomy. She has noted some swelling in the area just to the right of her prior incision and she felt a "pop" in the area and had some bloody drainage last week. She has not had any drainage since then. She comes in today because of the pressure in the discomfort in her lower back and she is concerned that she might have a recurrent pilonidal cyst. She always has low-grade fevers and she has not had any other fevers.  Review of Systems     Objective:   Physical Exam She has a well-healed surgical scar in the presacral region. She has a very small area of induration just to the right of her midline without any obvious site of infection. There is no erythema. She has some mild tenderness but no evidence of any drainage.    Assessment:     History of pilonidal cyst I do not see any evidence of recurrent pilonidal cyst or any evidence of acute infection. She does have a slight area of swelling or possible induration to the right of her midline but there is nothing at this time which I think requires any drainage. I discussed with her the options for antibiotic treatment in case this is a developing infection but she has allergies to most antibiotics I think it would be best to make sure that we have infection prior to prescribing her an antibiotic.    Plan:     We will give her a short term followup with Dr. Carolynne Edouard to evaluate if any infection does arise but at this current time I do not see any evidence for need for treatment.

## 2012-05-22 ENCOUNTER — Ambulatory Visit (INDEPENDENT_AMBULATORY_CARE_PROVIDER_SITE_OTHER): Payer: Medicare Other | Admitting: General Surgery

## 2012-05-22 ENCOUNTER — Encounter (INDEPENDENT_AMBULATORY_CARE_PROVIDER_SITE_OTHER): Payer: Self-pay | Admitting: General Surgery

## 2012-05-22 VITALS — BP 126/80 | HR 72 | Temp 97.9°F | Resp 16 | Ht 63.0 in | Wt 182.8 lb

## 2012-05-22 DIAGNOSIS — M549 Dorsalgia, unspecified: Secondary | ICD-10-CM

## 2012-05-22 NOTE — Progress Notes (Signed)
Subjective:     Patient ID: Autumn Frye, female   DOB: 15-Feb-1961, 51 y.o.   MRN: [id number]  HPI The patient is a 51 year old white female who is 11 years status post incision and drainage of a pilonidal abscess. Several weeks ago she noticed a red spot across from her old surgery scar. She returned on this area very hard in noticed some blood expressed from the area. She denies any fevers. She came to our urgent office and saw one of our partners who placed her on some antibiotics but his node did not indicate that he noticed any evidence of infection. She denies any pain in her gluteal cleft but she is having significant pain in her low back for which she has seen a neurosurgeon  Review of Systems  Constitutional: Negative.   HENT: Negative.   Eyes: Negative.   Respiratory: Negative.   Cardiovascular: Negative.   Gastrointestinal: Negative.   Genitourinary: Negative.   Musculoskeletal: Positive for back pain.  Skin: Negative.   Neurological: Negative.   Hematological: Negative.   Psychiatric/Behavioral: Negative.        Objective:   Physical Exam  Constitutional: She is oriented to person, place, and time. She appears well-developed and well-nourished.  HENT:  Head: Normocephalic and atraumatic.  Eyes: Conjunctivae normal and EOM are normal. Pupils are equal, round, and reactive to light.  Neck: Normal range of motion. Neck supple.  Cardiovascular: Normal rate, regular rhythm and normal heart sounds.   Pulmonary/Chest: Effort normal and breath sounds normal.  Abdominal: Soft. Bowel sounds are normal.  Genitourinary:       There is no evidence of pitting of the skin in the gluteal cleft area. Her old scar has healed well. There is no evidence of infection.  Musculoskeletal: Normal range of motion.  Neurological: She is alert and oriented to person, place, and time.  Skin: Skin is warm and dry.  Psychiatric: She has a normal mood and affect. Her behavior is normal.         Assessment:     It is not clear what happened to her several weeks ago but I do not see evidence of a recurrent pilonidal cyst today. The red dot appears as though it may have been a small pimple that ruptured but there is no induration or redness associated with the skin around it.    Plan:     At this point I do not see evidence of a recurrent cyst. I have encouraged her to followup with her neurosurgeon for her low back pain. We will plan to see her back on a when necessary basis.

## 2012-05-22 NOTE — Patient Instructions (Signed)
Continue to follow up with neurosurgery

## 2012-07-28 ENCOUNTER — Other Ambulatory Visit (HOSPITAL_COMMUNITY): Payer: Self-pay | Admitting: Internal Medicine

## 2012-07-28 ENCOUNTER — Ambulatory Visit (HOSPITAL_COMMUNITY)
Admission: RE | Admit: 2012-07-28 | Discharge: 2012-07-28 | Disposition: A | Discharge status: Home / Self Care | Payer: Medicare Other | Source: Ambulatory Visit | Attending: Internal Medicine | Admitting: Internal Medicine

## 2012-07-28 DIAGNOSIS — R079 Chest pain, unspecified: Secondary | ICD-10-CM | POA: Insufficient documentation

## 2012-07-28 DIAGNOSIS — R071 Chest pain on breathing: Secondary | ICD-10-CM

## 2012-07-28 DIAGNOSIS — M25559 Pain in unspecified hip: Secondary | ICD-10-CM

## 2012-07-28 DIAGNOSIS — M069 Rheumatoid arthritis, unspecified: Secondary | ICD-10-CM | POA: Insufficient documentation

## 2012-07-28 DIAGNOSIS — M545 Low back pain: Secondary | ICD-10-CM

## 2012-07-31 ENCOUNTER — Other Ambulatory Visit: Payer: Self-pay | Admitting: Internal Medicine

## 2012-07-31 DIAGNOSIS — K118 Other diseases of salivary glands: Secondary | ICD-10-CM

## 2012-08-01 ENCOUNTER — Other Ambulatory Visit: Payer: Medicare Other

## 2012-08-01 ENCOUNTER — Ambulatory Visit
Admission: RE | Admit: 2012-08-01 | Discharge: 2012-08-01 | Disposition: A | Discharge status: Home / Self Care | Payer: Medicare Other | Source: Ambulatory Visit | Attending: Internal Medicine | Admitting: Internal Medicine

## 2012-08-01 DIAGNOSIS — K118 Other diseases of salivary glands: Secondary | ICD-10-CM

## 2012-09-14 ENCOUNTER — Other Ambulatory Visit: Payer: Self-pay | Admitting: Internal Medicine

## 2012-09-14 DIAGNOSIS — Z1231 Encounter for screening mammogram for malignant neoplasm of breast: Secondary | ICD-10-CM

## 2012-10-16 ENCOUNTER — Ambulatory Visit
Admission: RE | Admit: 2012-10-16 | Discharge: 2012-10-16 | Disposition: A | Discharge status: Home / Self Care | Payer: Medicare Other | Source: Ambulatory Visit | Attending: Internal Medicine | Admitting: Internal Medicine

## 2012-10-16 DIAGNOSIS — Z1231 Encounter for screening mammogram for malignant neoplasm of breast: Secondary | ICD-10-CM

## 2012-12-19 ENCOUNTER — Other Ambulatory Visit: Payer: Self-pay | Admitting: Family Medicine

## 2012-12-19 DIAGNOSIS — M545 Low back pain: Secondary | ICD-10-CM

## 2012-12-22 ENCOUNTER — Ambulatory Visit
Admission: RE | Admit: 2012-12-22 | Discharge: 2012-12-22 | Disposition: A | Discharge status: Home / Self Care | Payer: Medicare Other | Source: Ambulatory Visit | Attending: Family Medicine | Admitting: Family Medicine

## 2012-12-22 VITALS — BP 134/83 | HR 59

## 2012-12-22 DIAGNOSIS — M549 Dorsalgia, unspecified: Secondary | ICD-10-CM

## 2012-12-22 DIAGNOSIS — M545 Low back pain: Secondary | ICD-10-CM

## 2012-12-22 MED ORDER — IOHEXOL 180 MG/ML  SOLN
15.0000 mL | Freq: Once | INTRAMUSCULAR | Status: AC | PRN
  Administered 2012-12-22: 15 mL via INTRATHECAL

## 2012-12-22 MED ORDER — DIAZEPAM 5 MG PO TABS
10.0000 mg | ORAL_TABLET | Freq: Once | ORAL | Status: AC
  Administered 2012-12-22: 5 mg via ORAL

## 2012-12-22 MED ORDER — ONDANSETRON HCL 4 MG/2ML IJ SOLN
4.0000 mg | Freq: Once | INTRAMUSCULAR | Status: AC
  Administered 2012-12-22: 4 mg via INTRAMUSCULAR

## 2012-12-22 MED ORDER — HYDROMORPHONE HCL PF 2 MG/ML IJ SOLN
2.0000 mg | Freq: Once | INTRAMUSCULAR | Status: AC
  Administered 2012-12-22: 2 mg via INTRAMUSCULAR

## 2013-01-05 ENCOUNTER — Other Ambulatory Visit: Payer: Self-pay | Admitting: Neurosurgery

## 2013-01-15 ENCOUNTER — Encounter (HOSPITAL_COMMUNITY): Payer: Self-pay | Admitting: Pharmacy Technician

## 2013-01-17 ENCOUNTER — Encounter (HOSPITAL_COMMUNITY): Payer: Self-pay

## 2013-01-17 ENCOUNTER — Encounter (HOSPITAL_COMMUNITY)
Admission: RE | Admit: 2013-01-17 | Discharge: 2013-01-17 | Disposition: A | Discharge status: Home / Self Care | Payer: Medicare Other | Source: Ambulatory Visit | Attending: Neurosurgery | Admitting: Neurosurgery

## 2013-01-17 DIAGNOSIS — Z01812 Encounter for preprocedural laboratory examination: Secondary | ICD-10-CM | POA: Insufficient documentation

## 2013-01-17 DIAGNOSIS — Z01818 Encounter for other preprocedural examination: Secondary | ICD-10-CM | POA: Insufficient documentation

## 2013-01-17 DIAGNOSIS — Z01810 Encounter for preprocedural cardiovascular examination: Secondary | ICD-10-CM | POA: Insufficient documentation

## 2013-01-17 HISTORY — DX: Hypothyroidism, unspecified: E03.9

## 2013-01-17 LAB — CBC
Hemoglobin: 14.8 g/dL (ref 12.0–15.0)
MCH: 31.1 pg (ref 26.0–34.0)
MCHC: 34.8 g/dL (ref 30.0–36.0)
MCV: 89.3 fL (ref 78.0–100.0)
RBC: 4.76 MIL/uL (ref 3.87–5.11)

## 2013-01-17 LAB — BASIC METABOLIC PANEL
BUN: 13 mg/dL (ref 6–23)
CO2: 29 mEq/L (ref 19–32)
Glucose, Bld: 96 mg/dL (ref 70–99)
Potassium: 4.2 mEq/L (ref 3.5–5.1)
Sodium: 137 mEq/L (ref 135–145)

## 2013-01-17 NOTE — Pre-Procedure Instructions (Signed)
Autumn Frye  01/17/2013    Your procedure is scheduled on:  Friday Jan 26, 2013  Report to [facility] at 0945 AM.  Call this number if you have problems the morning of surgery: [phone]   Remember:   Do not eat food or drink liquids after midnight.   Take these medicines the morning of surgery with A SIP OF WATER: Synthroid, Claritin, use and bring inhaler ProAir inhaler with you day of surgery   Do not wear jewelry, make-up or nail polish.  Do not wear lotions, powders, or perfumes. You may wear deodorant.  Do not shave 48 hours prior to surgery.   Do not bring valuables to the hospital.  Contacts, dentures or bridgework may not be worn into surgery.  Leave suitcase in the car. After surgery it may be brought to your room.  For patients admitted to the hospital, checkout time is 11:00 AM the day of  discharge.   Patients discharged the day of surgery will not be allowed to drive  home.   Special Instructions: Shower using CHG 2 nights before surgery and the night before surgery.  If you shower the day of surgery use CHG.  Use special wash - you have one bottle of CHG for all showers.  You should use approximately 1/3 of the bottle for each shower.   Please read over the following fact sheets that you were given: Pain Booklet, Coughing and Deep Breathing, Blood Transfusion Information, MRSA Information and Surgical Site Infection Prevention

## 2013-01-25 ENCOUNTER — Encounter (HOSPITAL_COMMUNITY): Payer: Self-pay | Admitting: Certified Registered Nurse Anesthetist

## 2013-01-25 MED ORDER — DEXAMETHASONE SODIUM PHOSPHATE 10 MG/ML IJ SOLN
10.0000 mg | INTRAMUSCULAR | Status: AC
  Administered 2013-01-26: 10 mg via INTRAVENOUS
  Filled 2013-01-25: qty 1

## 2013-01-25 MED ORDER — VANCOMYCIN HCL IN DEXTROSE 1-5 GM/200ML-% IV SOLN
1000.0000 mg | INTRAVENOUS | Status: AC
  Administered 2013-01-26: 1000 mg via INTRAVENOUS
  Filled 2013-01-25: qty 200

## 2013-01-25 NOTE — Preoperative (Signed)
Beta Blockers   Reason not to administer Beta Blockers:Not Applicable 

## 2013-01-25 NOTE — Progress Notes (Signed)
Patient called and asked  to be here at 0530 in order to move her surgery up to 0730 start time.  She states she would love to be here early and get surgery done early. Heather in OR notified and will change her surgery time to 0730.

## 2013-01-26 ENCOUNTER — Encounter (HOSPITAL_COMMUNITY)
Admission: RE | Disposition: A | Discharge status: Home / Self Care | Payer: Self-pay | Source: Ambulatory Visit | Attending: Neurosurgery

## 2013-01-26 ENCOUNTER — Encounter (HOSPITAL_COMMUNITY): Payer: Self-pay | Admitting: Certified Registered Nurse Anesthetist

## 2013-01-26 ENCOUNTER — Inpatient Hospital Stay (HOSPITAL_COMMUNITY): Payer: Medicare Other

## 2013-01-26 ENCOUNTER — Inpatient Hospital Stay (HOSPITAL_COMMUNITY)
Admission: RE | Admit: 2013-01-26 | Discharge: 2013-01-30 | DRG: 460 | Disposition: A | Discharge status: Home / Self Care | Payer: Medicare Other | Source: Ambulatory Visit | Attending: Neurosurgery | Admitting: Neurosurgery

## 2013-01-26 ENCOUNTER — Inpatient Hospital Stay (HOSPITAL_COMMUNITY): Payer: Medicare Other | Admitting: Certified Registered Nurse Anesthetist

## 2013-01-26 ENCOUNTER — Encounter (HOSPITAL_COMMUNITY): Payer: Self-pay | Admitting: *Deleted

## 2013-01-26 DIAGNOSIS — E890 Postprocedural hypothyroidism: Secondary | ICD-10-CM | POA: Diagnosis present

## 2013-01-26 DIAGNOSIS — Z79899 Other long term (current) drug therapy: Secondary | ICD-10-CM

## 2013-01-26 DIAGNOSIS — M069 Rheumatoid arthritis, unspecified: Secondary | ICD-10-CM | POA: Diagnosis present

## 2013-01-26 DIAGNOSIS — K449 Diaphragmatic hernia without obstruction or gangrene: Secondary | ICD-10-CM | POA: Diagnosis present

## 2013-01-26 DIAGNOSIS — IMO0002 Reserved for concepts with insufficient information to code with codable children: Secondary | ICD-10-CM

## 2013-01-26 DIAGNOSIS — K219 Gastro-esophageal reflux disease without esophagitis: Secondary | ICD-10-CM | POA: Diagnosis present

## 2013-01-26 DIAGNOSIS — Z96659 Presence of unspecified artificial knee joint: Secondary | ICD-10-CM

## 2013-01-26 DIAGNOSIS — Q762 Congenital spondylolisthesis: Principal | ICD-10-CM

## 2013-01-26 DIAGNOSIS — F17200 Nicotine dependence, unspecified, uncomplicated: Secondary | ICD-10-CM | POA: Diagnosis present

## 2013-01-26 DIAGNOSIS — E785 Hyperlipidemia, unspecified: Secondary | ICD-10-CM | POA: Diagnosis present

## 2013-01-26 DIAGNOSIS — M48061 Spinal stenosis, lumbar region without neurogenic claudication: Secondary | ICD-10-CM | POA: Diagnosis present

## 2013-01-26 DIAGNOSIS — Z85850 Personal history of malignant neoplasm of thyroid: Secondary | ICD-10-CM

## 2013-01-26 HISTORY — PX: ANTERIOR LAT LUMBAR FUSION: SHX1168

## 2013-01-26 HISTORY — PX: LUMBAR PERCUTANEOUS PEDICLE SCREW 1 LEVEL: SHX5560

## 2013-01-26 SURGERY — ANTERIOR LATERAL LUMBAR FUSION 1 LEVEL
Anesthesia: General | Site: Flank | Laterality: Left | Wound class: Clean

## 2013-01-26 MED ORDER — LIDOCAINE HCL 4 % MT SOLN
OROMUCOSAL | Status: DC | PRN
  Administered 2013-01-26: 4 mL via TOPICAL

## 2013-01-26 MED ORDER — EPHEDRINE SULFATE 50 MG/ML IJ SOLN
INTRAMUSCULAR | Status: DC | PRN
  Administered 2013-01-26: 10 mg via INTRAVENOUS

## 2013-01-26 MED ORDER — BACLOFEN 10 MG PO TABS
10.0000 mg | ORAL_TABLET | Freq: Three times a day (TID) | ORAL | Status: DC | PRN
  Filled 2013-01-26: qty 2

## 2013-01-26 MED ORDER — SODIUM CHLORIDE 0.9 % IR SOLN
Status: DC | PRN
  Administered 2013-01-26 (×2)

## 2013-01-26 MED ORDER — ACETAMINOPHEN 650 MG RE SUPP: 650.0000 mg | RECTAL | Status: DC | PRN

## 2013-01-26 MED ORDER — LEVOTHYROXINE SODIUM 125 MCG PO TABS
125.0000 ug | ORAL_TABLET | Freq: Every day | ORAL | Status: DC
  Administered 2013-01-27 – 2013-01-30 (×4): 125 ug via ORAL
  Filled 2013-01-26 (×5): qty 1

## 2013-01-26 MED ORDER — PANTOPRAZOLE SODIUM 40 MG IV SOLR
40.0000 mg | Freq: Every day | INTRAVENOUS | Status: DC
  Administered 2013-01-26: 40 mg via INTRAVENOUS
  Filled 2013-01-26 (×2): qty 40

## 2013-01-26 MED ORDER — BACITRACIN 50000 UNITS IM SOLR
INTRAMUSCULAR | Status: AC
  Filled 2013-01-26: qty 1

## 2013-01-26 MED ORDER — LACTATED RINGERS IV SOLN
INTRAVENOUS | Status: DC | PRN
  Administered 2013-01-26 (×3): via INTRAVENOUS

## 2013-01-26 MED ORDER — ALBUTEROL SULFATE HFA 108 (90 BASE) MCG/ACT IN AERS: 2.0000 | INHALATION_SPRAY | Freq: Four times a day (QID) | RESPIRATORY_TRACT | Status: DC | PRN

## 2013-01-26 MED ORDER — BUPIVACAINE HCL (PF) 0.25 % IJ SOLN
INTRAMUSCULAR | Status: DC | PRN
  Administered 2013-01-26: 10 mL
  Administered 2013-01-26: 16 mL

## 2013-01-26 MED ORDER — NEOSTIGMINE METHYLSULFATE 1 MG/ML IJ SOLN
INTRAMUSCULAR | Status: DC | PRN
  Administered 2013-01-26: 4 mg via INTRAVENOUS

## 2013-01-26 MED ORDER — SODIUM CHLORIDE 0.9 % IV SOLN: 250.0000 mL | INTRAVENOUS | Status: DC

## 2013-01-26 MED ORDER — ACETAMINOPHEN 325 MG PO TABS: 650.0000 mg | ORAL_TABLET | ORAL | Status: DC | PRN

## 2013-01-26 MED ORDER — HYDROMORPHONE HCL PF 1 MG/ML IJ SOLN
1.0000 mg | INTRAMUSCULAR | Status: DC | PRN
  Administered 2013-01-26 – 2013-01-28 (×5): 1 mg via INTRAMUSCULAR
  Administered 2013-01-29 – 2013-01-30 (×2): 1.5 mg via INTRAMUSCULAR
  Filled 2013-01-26: qty 2
  Filled 2013-01-26 (×4): qty 1
  Filled 2013-01-26: qty 2
  Filled 2013-01-26: qty 1

## 2013-01-26 MED ORDER — PROPOFOL 10 MG/ML IV BOLUS
INTRAVENOUS | Status: DC | PRN
  Administered 2013-01-26: 200 mg via INTRAVENOUS

## 2013-01-26 MED ORDER — ARTIFICIAL TEARS OP OINT
TOPICAL_OINTMENT | OPHTHALMIC | Status: DC | PRN
  Administered 2013-01-26: 1 via OPHTHALMIC

## 2013-01-26 MED ORDER — ZOLPIDEM TARTRATE 5 MG PO TABS: 5.0000 mg | ORAL_TABLET | Freq: Every evening | ORAL | Status: DC | PRN

## 2013-01-26 MED ORDER — CEFAZOLIN SODIUM-DEXTROSE 2-3 GM-% IV SOLR
2.0000 g | Freq: Three times a day (TID) | INTRAVENOUS | Status: AC
  Administered 2013-01-26 (×2): 2 g via INTRAVENOUS
  Filled 2013-01-26 (×2): qty 50

## 2013-01-26 MED ORDER — CYCLOBENZAPRINE HCL 10 MG PO TABS
ORAL_TABLET | ORAL | Status: AC
  Filled 2013-01-26: qty 1

## 2013-01-26 MED ORDER — THROMBIN 20000 UNITS EX SOLR
CUTANEOUS | Status: DC | PRN
  Administered 2013-01-26: 07:00:00 via TOPICAL

## 2013-01-26 MED ORDER — CYCLOBENZAPRINE HCL 10 MG PO TABS
10.0000 mg | ORAL_TABLET | Freq: Three times a day (TID) | ORAL | Status: DC | PRN
  Administered 2013-01-26 – 2013-01-29 (×4): 10 mg via ORAL
  Filled 2013-01-26 (×3): qty 1

## 2013-01-26 MED ORDER — ACETAMINOPHEN 10 MG/ML IV SOLN
INTRAVENOUS | Status: AC
  Administered 2013-01-26: 1000 mg via INTRAVENOUS
  Filled 2013-01-26: qty 100

## 2013-01-26 MED ORDER — KCL IN DEXTROSE-NACL 20-5-0.45 MEQ/L-%-% IV SOLN
80.0000 mL/h | INTRAVENOUS | Status: DC
  Administered 2013-01-26 – 2013-01-27 (×2): 80 mL/h via INTRAVENOUS
  Filled 2013-01-26 (×10): qty 1000

## 2013-01-26 MED ORDER — FENTANYL CITRATE 0.05 MG/ML IJ SOLN
INTRAMUSCULAR | Status: DC | PRN
  Administered 2013-01-26 (×2): 25 ug via INTRAVENOUS
  Administered 2013-01-26 (×4): 100 ug via INTRAVENOUS

## 2013-01-26 MED ORDER — ONDANSETRON HCL 4 MG/2ML IJ SOLN: 4.0000 mg | INTRAMUSCULAR | Status: DC | PRN

## 2013-01-26 MED ORDER — SODIUM CHLORIDE 0.9 % IJ SOLN: 3.0000 mL | INTRAMUSCULAR | Status: DC | PRN

## 2013-01-26 MED ORDER — PHENYLEPHRINE HCL 10 MG/ML IJ SOLN
INTRAMUSCULAR | Status: DC | PRN
  Administered 2013-01-26 (×3): 80 ug via INTRAVENOUS

## 2013-01-26 MED ORDER — ONDANSETRON HCL 4 MG/2ML IJ SOLN
INTRAMUSCULAR | Status: DC | PRN
  Administered 2013-01-26 (×2): 4 mg via INTRAVENOUS

## 2013-01-26 MED ORDER — OXYCODONE-ACETAMINOPHEN 5-325 MG PO TABS
1.0000 | ORAL_TABLET | ORAL | Status: DC | PRN
  Administered 2013-01-26 – 2013-01-30 (×17): 2 via ORAL
  Filled 2013-01-26 (×17): qty 2

## 2013-01-26 MED ORDER — PHENOL 1.4 % MT LIQD: 1.0000 | OROMUCOSAL | Status: DC | PRN

## 2013-01-26 MED ORDER — PREDNISONE 10 MG PO TABS
10.0000 mg | ORAL_TABLET | Freq: Every day | ORAL | Status: DC
  Administered 2013-01-26 – 2013-01-30 (×5): 10 mg via ORAL
  Filled 2013-01-26 (×6): qty 1

## 2013-01-26 MED ORDER — OXYCODONE HCL 5 MG/5ML PO SOLN: 5.0000 mg | Freq: Once | ORAL | Status: AC | PRN

## 2013-01-26 MED ORDER — EZETIMIBE 10 MG PO TABS
10.0000 mg | ORAL_TABLET | Freq: Every day | ORAL | Status: DC
  Administered 2013-01-26 – 2013-01-30 (×5): 10 mg via ORAL
  Filled 2013-01-26 (×5): qty 1

## 2013-01-26 MED ORDER — LIDOCAINE HCL (CARDIAC) 20 MG/ML IV SOLN
INTRAVENOUS | Status: DC | PRN
  Administered 2013-01-26: 80 mg via INTRAVENOUS

## 2013-01-26 MED ORDER — MIDAZOLAM HCL 5 MG/5ML IJ SOLN
INTRAMUSCULAR | Status: DC | PRN
  Administered 2013-01-26: 2 mg via INTRAVENOUS

## 2013-01-26 MED ORDER — SODIUM CHLORIDE 0.9 % IV SOLN
INTRAVENOUS | Status: AC
  Filled 2013-01-26: qty 500

## 2013-01-26 MED ORDER — ONDANSETRON HCL 4 MG/2ML IJ SOLN: 4.0000 mg | Freq: Four times a day (QID) | INTRAMUSCULAR | Status: DC | PRN

## 2013-01-26 MED ORDER — ROCURONIUM BROMIDE 100 MG/10ML IV SOLN
INTRAVENOUS | Status: DC | PRN
Start: 2013-01-26 — End: 2013-01-26
  Administered 2013-01-26: 30 mg via INTRAVENOUS

## 2013-01-26 MED ORDER — OXYCODONE HCL 5 MG PO TABS
ORAL_TABLET | ORAL | Status: AC
  Filled 2013-01-26: qty 1

## 2013-01-26 MED ORDER — ALBUMIN HUMAN 5 % IV SOLN
INTRAVENOUS | Status: DC | PRN
  Administered 2013-01-26: 09:00:00 via INTRAVENOUS

## 2013-01-26 MED ORDER — FUROSEMIDE 40 MG PO TABS
40.0000 mg | ORAL_TABLET | Freq: Every day | ORAL | Status: DC
  Administered 2013-01-26 – 2013-01-30 (×5): 40 mg via ORAL
  Filled 2013-01-26 (×5): qty 1

## 2013-01-26 MED ORDER — MENTHOL 3 MG MT LOZG: 1.0000 | LOZENGE | OROMUCOSAL | Status: DC | PRN

## 2013-01-26 MED ORDER — OXYCODONE HCL 5 MG PO TABS
5.0000 mg | ORAL_TABLET | Freq: Once | ORAL | Status: AC | PRN
  Administered 2013-01-26: 5 mg via ORAL

## 2013-01-26 MED ORDER — LORATADINE 10 MG PO TABS
10.0000 mg | ORAL_TABLET | Freq: Every day | ORAL | Status: DC
  Administered 2013-01-26 – 2013-01-30 (×5): 10 mg via ORAL
  Filled 2013-01-26 (×5): qty 1

## 2013-01-26 MED ORDER — HYDROMORPHONE HCL PF 1 MG/ML IJ SOLN: 1.0000 mg | INTRAMUSCULAR | Status: DC | PRN

## 2013-01-26 MED ORDER — 0.9 % SODIUM CHLORIDE (POUR BTL) OPTIME
TOPICAL | Status: DC | PRN
  Administered 2013-01-26: 1000 mL

## 2013-01-26 MED ORDER — SUCCINYLCHOLINE CHLORIDE 20 MG/ML IJ SOLN
INTRAMUSCULAR | Status: DC | PRN
  Administered 2013-01-26: 100 mg via INTRAVENOUS

## 2013-01-26 MED ORDER — PROPOFOL INFUSION 10 MG/ML OPTIME
INTRAVENOUS | Status: DC | PRN
  Administered 2013-01-26: 50 ug/kg/min via INTRAVENOUS

## 2013-01-26 MED ORDER — SODIUM CHLORIDE 0.9 % IJ SOLN
3.0000 mL | Freq: Two times a day (BID) | INTRAMUSCULAR | Status: DC
  Administered 2013-01-26 – 2013-01-30 (×7): 3 mL via INTRAVENOUS

## 2013-01-26 MED ORDER — HYDROMORPHONE HCL PF 1 MG/ML IJ SOLN
0.2500 mg | INTRAMUSCULAR | Status: DC | PRN
  Administered 2013-01-26 (×4): 0.5 mg via INTRAVENOUS

## 2013-01-26 MED ORDER — GLYCOPYRROLATE 0.2 MG/ML IJ SOLN
INTRAMUSCULAR | Status: DC | PRN
Start: 2013-01-26 — End: 2013-01-26
  Administered 2013-01-26: 0.2 mg via INTRAVENOUS
  Administered 2013-01-26: 0.6 mg via INTRAVENOUS

## 2013-01-26 SURGICAL SUPPLY — 62 items
ADH SKN CLS APL DERMABOND .7 (GAUZE/BANDAGES/DRESSINGS) ×4
APL SKNCLS STERI-STRIP NONHPOA (GAUZE/BANDAGES/DRESSINGS) ×4
BAG DECANTER FOR FLEXI CONT (MISCELLANEOUS) ×5 IMPLANT
BENZOIN TINCTURE PRP APPL 2/3 (GAUZE/BANDAGES/DRESSINGS) ×8 IMPLANT
BLADE SURG ROTATE 9660 (MISCELLANEOUS) IMPLANT
BONE EQUIVA 5CC (Bone Implant) ×1 IMPLANT
BRUSH SCRUB EZ PLAIN DRY (MISCELLANEOUS) ×3 IMPLANT
CAGE CALIBER 18X45M 9-13M SPIN (Cage) ×1 IMPLANT
CLOTH BEACON ORANGE TIMEOUT ST (SAFETY) ×6 IMPLANT
CONT SPEC 4OZ CLIKSEAL STRL BL (MISCELLANEOUS) ×2 IMPLANT
COVER BACK TABLE 24X17X13 BIG (DRAPES) IMPLANT
COVER TABLE BACK 60X90 (DRAPES) ×4 IMPLANT
DERMABOND ADVANCED (GAUZE/BANDAGES/DRESSINGS) ×2
DERMABOND ADVANCED .7 DNX12 (GAUZE/BANDAGES/DRESSINGS) ×6 IMPLANT
DILATOR NON-RADIOLUCENT CANN (MISCELLANEOUS) ×1 IMPLANT
DRAPE C-ARM 42X72 X-RAY (DRAPES) ×8 IMPLANT
DRAPE C-ARMOR (DRAPES) ×6 IMPLANT
DRAPE LAPAROTOMY 100X72X124 (DRAPES) ×6 IMPLANT
DRAPE SURG 17X23 STRL (DRAPES) ×12 IMPLANT
DRESSING TELFA 8X3 (GAUZE/BANDAGES/DRESSINGS) ×6 IMPLANT
DURAPREP 26ML APPLICATOR (WOUND CARE) ×4 IMPLANT
ELECT BLADE 4.0 EZ CLEAN MEGAD (MISCELLANEOUS) ×3
ELECT REM PT RETURN 9FT ADLT (ELECTROSURGICAL) ×6
ELECTRODE BLDE 4.0 EZ CLN MEGD (MISCELLANEOUS) ×2 IMPLANT
ELECTRODE REM PT RTRN 9FT ADLT (ELECTROSURGICAL) ×4 IMPLANT
EVACUATOR 1/8 PVC DRAIN (DRAIN) IMPLANT
GAUZE SPONGE 4X4 16PLY XRAY LF (GAUZE/BANDAGES/DRESSINGS) IMPLANT
GLOVE BIO SURGEON STRL SZ8 (GLOVE) IMPLANT
GLOVE BIOGEL M 8.0 STRL (GLOVE) ×1 IMPLANT
GLOVE ECLIPSE 7.5 STRL STRAW (GLOVE) ×10 IMPLANT
GLOVE EXAM NITRILE LRG STRL (GLOVE) ×2 IMPLANT
GLOVE EXAM NITRILE XL STR (GLOVE) IMPLANT
GLOVE EXAM NITRILE XS STR PU (GLOVE) IMPLANT
GLOVE INDICATOR 8.0 STRL GRN (GLOVE) ×1 IMPLANT
GOWN BRE IMP SLV AUR LG STRL (GOWN DISPOSABLE) ×4 IMPLANT
GOWN BRE IMP SLV AUR XL STRL (GOWN DISPOSABLE) IMPLANT
GOWN STRL REIN 2XL LVL4 (GOWN DISPOSABLE) ×5 IMPLANT
K-WIRE NITHNOL TROCAR TIP (WIRE) ×4 IMPLANT
KIT BASIN OR (CUSTOM PROCEDURE TRAY) ×6 IMPLANT
KIT ROOM TURNOVER OR (KITS) ×6 IMPLANT
NEEDLE HYPO 22GX1.5 SAFETY (NEEDLE) ×5 IMPLANT
NEEDLE TARGETING (NEEDLE) ×4 IMPLANT
NS IRRIG 1000ML POUR BTL (IV SOLUTION) ×5 IMPLANT
PACK LAMINECTOMY NEURO (CUSTOM PROCEDURE TRAY) ×6 IMPLANT
ROD BENT PERC 35MM (Rod) ×1 IMPLANT
ROD PATHFINDER 40MM (Rod) ×1 IMPLANT
SCREW MIN INVASIVE 6.5X45 (Screw) ×4 IMPLANT
SPONGE GAUZE 4X4 12PLY (GAUZE/BANDAGES/DRESSINGS) ×6 IMPLANT
SPONGE LAP 4X18 X RAY DECT (DISPOSABLE) IMPLANT
SPONGE SURGIFOAM ABS GEL SZ50 (HEMOSTASIS) IMPLANT
STRIP CLOSURE SKIN 1/2X4 (GAUZE/BANDAGES/DRESSINGS) ×6 IMPLANT
SUT VIC AB 2-0 OS6 18 (SUTURE) ×24 IMPLANT
SUT VIC AB 3-0 CP2 18 (SUTURE) ×10 IMPLANT
SYR 20ML ECCENTRIC (SYRINGE) ×6 IMPLANT
TAPE CLOTH 3X10 TAN LF (GAUZE/BANDAGES/DRESSINGS) ×6 IMPLANT
TISSUE DILATOR RADIOLUCENT PATH FINDER  ZIMMER ×2 IMPLANT
TOP CLSR SEQUOIA (Orthopedic Implant) ×4 IMPLANT
TOWEL OR 17X24 6PK STRL BLUE (TOWEL DISPOSABLE) ×6 IMPLANT
TOWEL OR 17X26 10 PK STRL BLUE (TOWEL DISPOSABLE) ×6 IMPLANT
TRAP SPECIMEN MUCOUS 40CC (MISCELLANEOUS) IMPLANT
TRAY FOLEY CATH 14FRSI W/METER (CATHETERS) ×3 IMPLANT
WATER STERILE IRR 1000ML POUR (IV SOLUTION) ×6 IMPLANT

## 2013-01-26 NOTE — Plan of Care (Signed)
Problem: Consults Goal: Diagnosis - Spinal Surgery Outcome: Completed/Met Date Met:  01/26/13 Anterior/lateral infusion 1 level

## 2013-01-26 NOTE — OR Nursing (Signed)
Stim needle electrodes removed  At 0930 patient reposition for procedure two and prep

## 2013-01-26 NOTE — Anesthesia Postprocedure Evaluation (Signed)
Anesthesia Post Note  Patient: Autumn Frye  Procedure(s) Performed: Procedure(s) (LRB): ANTERIOR LATERAL LUMBAR FUSION 1 LEVEL (Left) LUMBAR PERCUTANEOUS PEDICLE SCREW 1 LEVEL (Left)  Anesthesia type: General  Patient location: PACU  Post pain: Pain level controlled and Adequate analgesia  Post assessment: Post-op Vital signs reviewed, Patient's Cardiovascular Status Stable, Respiratory Function Stable, Patent Airway and Pain level controlled  Last Vitals:  Filed Vitals:   01/26/13 1246  BP: 114/71  Pulse: 74  Temp:   Resp: 13    Post vital signs: Reviewed and stable  Level of consciousness: awake, alert  and oriented  Complications: No apparent anesthesia complications

## 2013-01-26 NOTE — OR Nursing (Signed)
Stim needle electrodes placed by representative after induction at start of first procedure

## 2013-01-26 NOTE — Transfer of Care (Signed)
Immediate Anesthesia Transfer of Care Note  Patient: Autumn Frye  Procedure(s) Performed: Procedure(s) with comments: ANTERIOR LATERAL LUMBAR FUSION 1 LEVEL (Left) - lumbar four-five LUMBAR PERCUTANEOUS PEDICLE SCREW 1 LEVEL (Left) - lumbar four-five  Patient Location: PACU  Anesthesia Type:General  Level of Consciousness: awake, alert  and oriented  Airway & Oxygen Therapy: Patient Spontanous Breathing and Patient connected to nasal cannula oxygen  Post-op Assessment: Report given to PACU RN, Post -op Vital signs reviewed and stable and Patient moving all extremities X 4  Post vital signs: Reviewed and stable  Complications: No apparent anesthesia complications

## 2013-01-26 NOTE — Op Note (Signed)
Preop diagnosis: Spondylolisthesis and spinal stenosis L4-5 Postop diagnosis: Same Procedure: Anterolateral fusion L4-5 via the left retroperitoneal approach with expandable peek interbody spacer                    L4-5 percutaneous pedicle screw fixation with Pathfinder pedicle screw instrumentation                    Intraoperative EMG monitoring Surgeon: Insurance risk surveyor: Yetta Barre  After and placed in the left side up lateral decubitus position aligned appropriately and x-ray the patient's left flank was prepped and draped in usual sterile fashion. Incision was made over the disc space at L4-5 and carried down to the fascia. A second incision was then made posterior with finger dissection into the retroperitoneum. From the finger upward to allow access to the retroperitoneum for the more anterior incision. We then sequential dilation with EMG monitoring and this showed no evidence of any abnormal readings. We then placed a retractor and did repeat EMG studies directly which showed no evidence of abnormal readings. We secured the retractor in standard fashion tested all around confirmed with AP lateral fluoroscopy and placed the shim in standard fashion. We then coagulated soft tissue and muscle to expose the disc space which was then incised a 15 blade. We cleaned out the disc space thoroughly with a variety of instruments and release the annulus on the opposite side with the small Cobb dissector. We then did sequential distraction of the disc space and felt that an 8-11 mm expandable cage of lordotic size and shape was appropriate. We filled the appropriate cage with morselized allograft irrigated copiously and then impacted the graft without difficulty. We then expanded the cage up to approximately 10 mm size and felt that this was a good choice. We then irrigated copiously controlled any bleeding with upper coagulation and removed the retractor. Final fluoroscopy in AP lateral direction showed good  placement of the cage and the distraction the disc space. We then closed the wound with multiple layers of Vicryl on the fascia subcutaneous and subcuticular tissues and Dermabond was placed on the skin. Sterile dressing was then applied the patient was turned in the prone position with the back was prepped and draped in the usual sterile fashion. Using AP fluoroscopy to identify the pedicles we cannulated the pedicles the Jamshidi needle passed it in standard fashion through the pedicle and the vertebral body then placed guidewires through the needles were removed the needles. We connected the 2 small incisions on each side and incised the fascia. We then did sequential dilation through the muscles and then tapped with a 6.0 tap and placed 6.5 mm x 45 mm pedicle screws at L4 and L5 bilaterally with the Towers attached. We then chose an appropriately length rods and secured them to the top of the screws with top loading nuts. We then did tightening and final tightening with torque and counter torque. We then removed the [facility] and final fluoroscopy in AP lateral direction looked excellent. Irrigation was carried out of both incisions the incisions were closed with multiple layers of Vicryl on the subcutaneous and subcuticular tissues and Dermabond and Steri-Strips were placed on the skin. Shortness was then applied the patient was extubated and taken to recovery room in stable condition.

## 2013-01-26 NOTE — H&P (Signed)
Autumn Frye is an 52 y.o. female.   Chief Complaint: Back and bilateral leg pain HPI: The patient is a 52 year old female who was evaluated for back and bilateral leg pain which is equal bilaterally. She's had this problem for years but is that it has been getting steadily worse. His been quite severe for the last 9 months. She has rheumatoid arthritis and has been seeing rheumatologist also saw pain specialist in [place] where she had injections. She's also seen orthopedists. An MRI scan was done back in June and she now comes for evaluation. She's been taking 5 mg of prednisone H. date rheumatoid arthritis also takes Robaxin and Percocet which have not given her any relief. After evaluation of her films that showed a mobile instability at L4-5. The options were discussed the patient requested surgery now she comes for an anterolateral fusion at L4-5 with percutaneous pedicle screw instrumentation. I've had a long discussion with her regarding the risks and benefits of surgical intervention. The risks discussed include but are not limited to bleeding infection weakness numbness paralysis trouble with instrumentation nonunion coma and death. We've discussed the special issues of traversing the cell as muscle. We have discussed alternative methods of therapy although risks and benefits of nonintervention. She's had the opportunity to rest numerous questions and appears to understand. With this information in hand she has requested we proceed with surgery.  Past Medical History  Diagnosis Date  . Arthritis     OA, RA  . Asthma   . Cancer     thyroid-1995  . Hiatal hernia   . Hyperlipidemia   . Hypothyroidism   . GERD (gastroesophageal reflux disease)     Past Surgical History  Procedure Laterality Date  . Thyroidectomy    . Upper gastrointestinal endoscopy    . Total knee arthroplasty      right  . Vaginal hysterectomy    . Knee surgery      3 on right knee, 1 on left-prior to TKR   . Pilonidal cyst excision    . Tonsillectomy      Family History  Problem Relation Age of Onset  . Colon cancer Neg Hx   . Esophageal cancer Neg Hx   . Stomach cancer Neg Hx   . Rectal cancer Neg Hx    Social History:  reports that she has been smoking Cigarettes.  She has a 40 pack-year smoking history. She does not have any smokeless tobacco history on file. She reports that  drinks alcohol. She reports that she does not use illicit drugs.  Allergies:  Allergies  Allergen Reactions  . Hydrocodone Itching  . Morphine And Related Itching  . Levaquin (Levofloxacin) Hives  . Ciprofloxacin Hives and Swelling  . Penicillins Itching  . Sulfa Antibiotics Itching    Medications Prior to Admission  Medication Sig Dispense Refill  . baclofen (LIORESAL) 20 MG tablet Take 10-20 mg by mouth 3 (three) times daily as needed (for back spasms).      . Biotin 5000 MCG TABS Take 5,000 mcg by mouth daily.      . Calcium Citrate-Vitamin D (CITRACAL + D PO) Take 1 tablet by mouth daily.       . Cholecalciferol (VITAMIN D3) 5000 UNITS TABS Take 5,000 mg by mouth daily.      Marland Kitchen doxycycline (ADOXA) 50 MG tablet Take 50 mg by mouth daily.      Marland Kitchen ezetimibe (ZETIA) 10 MG tablet Take 10 mg by mouth daily.      Marland Kitchen  folic acid (FOLVITE) 1 MG tablet Take 2 mg by mouth daily.      . furosemide (LASIX) 40 MG tablet Take 40 mg by mouth daily.      Marland Kitchen levothyroxine (SYNTHROID, LEVOTHROID) 125 MCG tablet Take 125 mcg by mouth daily.      Marland Kitchen loratadine (CLARITIN) 10 MG tablet Take 10 mg by mouth daily.      . Magnesium Oxide 500 MG TABS Take 500 mg by mouth daily.      Marland Kitchen OVER THE COUNTER MEDICATION Take 1 tablet by mouth daily. [facility] Cholestrol [facility]      . OVER THE COUNTER MEDICATION Take 1 tablet by mouth daily. [facility]      . oxyCODONE (OXY IR/ROXICODONE) 5 MG immediate release tablet Take 5 mg by mouth at bedtime as needed for pain.      . predniSONE (DELTASONE) 5 MG tablet Take  10 mg by mouth daily.       Marland Kitchen PROAIR HFA 108 (90 BASE) MCG/ACT inhaler Inhale 2 puffs into the lungs every 6 (six) hours as needed for wheezing or shortness of breath.       Marland Kitchen aspirin EC 325 MG tablet Take 325 mg by mouth daily.      . clindamycin (CLEOCIN) 150 MG capsule Take 600 mg by mouth daily as needed (for prior to dentist appt).       Jenetta Loges 0.1 MG/24HR RING Place 1 each vaginally every 3 (three) months.       . Golimumab (SIMPONI [place]) Inject into the skin. Injection montly      . methotrexate 1 G injection Inject as directed once. 0.06 mg injection every Thursday        No results found for this or any previous visit (from the past 48 hour(s)). No results found.  Significant for ocular rosacea some balance disturbance high cholesterol leg pain when she walks asthma and arthritic issues  Blood pressure 121/71, pulse 86, temperature 97.2 F (36.2 C), temperature source Oral, resp. rate 20, SpO2 98.00%.  Patient is awake alert and oriented. She is no facial asymmetry. Reflexes are 1+ with no pathologic reflexes. Coordination bouncer good. She is normal Station and gait was normal her strength and no evidence of atrophy or abnormal movements. Assessment/Plan Impression is that of mobile instability at L4-5 and the plan is for an L4-5 anterolateral fusion with pedicle screw fixation.  Reinaldo Meeker, MD 01/26/2013, 7:35 AM

## 2013-01-26 NOTE — Progress Notes (Signed)
UR COMPLETED  

## 2013-01-26 NOTE — Anesthesia Preprocedure Evaluation (Signed)
Anesthesia Evaluation  Patient identified by MRN, date of birth, ID band Patient awake    Reviewed: Allergy & Precautions, H&P , NPO status , Patient's Chart, lab work & pertinent test results  Airway Mallampati: II  Neck ROM: full    Dental   Pulmonary asthma ,          Cardiovascular     Neuro/Psych  Neuromuscular disease    GI/Hepatic hiatal hernia, GERD-  ,  Endo/Other  Hypothyroidism obese  Renal/GU      Musculoskeletal  (+) Arthritis -, Osteoarthritis and Rheumatoid disorders,    Abdominal   Peds  Hematology   Anesthesia Other Findings   Reproductive/Obstetrics                           Anesthesia Physical Anesthesia Plan  ASA: II  Anesthesia Plan: General   Post-op Pain Management:    Induction: Intravenous  Airway Management Planned: Oral ETT  Additional Equipment:   Intra-op Plan:   Post-operative Plan: Extubation in OR  Informed Consent: I have reviewed the patients History and Physical, chart, labs and discussed the procedure including the risks, benefits and alternatives for the proposed anesthesia with the patient or authorized representative who has indicated his/her understanding and acceptance.     Plan Discussed with: CRNA, Anesthesiologist and Surgeon  Anesthesia Plan Comments:         Anesthesia Quick Evaluation

## 2013-01-26 NOTE — Anesthesia Procedure Notes (Signed)
Procedure Name: Intubation Date/Time: 01/26/2013 7:49 AM Performed by: Margaree Mackintosh Pre-anesthesia Checklist: Patient identified, Timeout performed, Emergency Drugs available, Suction available and Patient being monitored Patient Re-evaluated:Patient Re-evaluated prior to inductionOxygen Delivery Method: Circle system utilized Preoxygenation: Pre-oxygenation with 100% oxygen Intubation Type: IV induction and Rapid sequence Laryngoscope Size: Mac Grade View: Grade II Tube type: Oral Tube size: 7.0 mm Number of attempts: 1 Airway Equipment and Method: LTA kit utilized Placement Confirmation: ETT inserted through vocal cords under direct vision,  positive ETCO2 and breath sounds checked- equal and bilateral Secured at: 22 cm Tube secured with: Tape Dental Injury: Teeth and Oropharynx as per pre-operative assessment

## 2013-01-27 MED ORDER — PANTOPRAZOLE SODIUM 40 MG PO TBEC
40.0000 mg | DELAYED_RELEASE_TABLET | Freq: Every day | ORAL | Status: DC
  Administered 2013-01-27 – 2013-01-30 (×4): 40 mg via ORAL
  Filled 2013-01-27 (×4): qty 1

## 2013-01-27 NOTE — Progress Notes (Signed)
Patient ID: Autumn Frye, female   DOB: 25-Jun-1961, 52 y.o.   MRN: [id number] Pt c/o LLE numbness. Back sore. Good strength except in L hip flexor. Already walked some. Seems to be doing ok. CCM

## 2013-01-27 NOTE — Progress Notes (Signed)
Tried to mobilize the patient. Did few steps in the room with walker. Having lot of pain in Left leg towards groin. No pain in Rt leg.

## 2013-01-27 NOTE — Evaluation (Signed)
Physical Therapy Evaluation Patient Details Name: Autumn Frye MRN: [id number] DOB: May 24, 1961 Today's Date: 01/27/2013 Time: 7829-5621 PT Time Calculation (min): 41 min  PT Assessment / Plan / Recommendation Clinical Impression  pt s/p L4/5 ALIF.  All education initiated with pt asking lots of appropriate questions.  Pt can benefit from PT to maximize Independence.  Pt can benefit from HHPT and maybe transition to OPPT to to improve her strength and flexibility.    PT Assessment  Patient needs continued PT services    Follow Up Recommendations  Home health PT;Supervision - Intermittent    Does the patient have the potential to tolerate intense rehabilitation      Barriers to Discharge Decreased caregiver support      Equipment Recommendations       Recommendations for Other Services     Frequency Min 5X/week    Precautions / Restrictions Precautions Precautions: Back   Pertinent Vitals/Pain       Mobility  Bed Mobility Bed Mobility: Rolling Left;Right Sidelying to Sit;Sitting - Scoot to [place] of Bed;Sit to Sidelying Left Rolling Left: 4: Min guard Right Sidelying to Sit: 4: Min guard Sitting - Scoot to [place] of Bed: 5: Supervision Sit to Sidelying Left: 4: Min guard Details for Bed Mobility Assistance: instructed in safe bed mobility technique Transfers Transfers: Sit to Stand;Stand to Sit Sit to Stand: 4: Min assist;From bed Stand to Sit: 4: Min guard;To bed Details for Transfer Assistance: reinforce safe technique Ambulation/Gait Ambulation/Gait Assistance: 4: Min guard Ambulation Distance (Feet): 90 Feet Assistive device: Rolling walker Ambulation/Gait Assistance Details: generally steady, lordotic posture with hip flexor tightness. Gait Pattern: Step-through pattern Stairs: No    Exercises     PT Diagnosis: Difficulty walking;Generalized weakness  PT Problem List: Decreased strength;Decreased activity tolerance;Decreased range of motion;Decreased  mobility;Decreased knowledge of use of DME;Decreased knowledge of precautions;Pain PT Treatment Interventions: DME instruction;Gait training;Stair training;Functional mobility training;Therapeutic activities;Patient/family education   PT Goals Acute Rehab PT Goals PT Goal Formulation: With patient Time For Goal Achievement: 02/03/13 Potential to Achieve Goals: Good Pt will go Supine/Side to Sit: with modified independence;with HOB 0 degrees PT Goal: Supine/Side to Sit - Progress: Goal set today Pt will go Sit to Stand: with modified independence PT Goal: Sit to Stand - Progress: Goal set today Pt will Transfer Bed to Chair/Chair to Bed: with modified independence PT Transfer Goal: Bed to Chair/Chair to Bed - Progress: Goal set today Pt will Ambulate: >150 feet;with modified independence;with least restrictive assistive device PT Goal: Ambulate - Progress: Goal set today Pt will Go Up / Down Stairs: 3-5 stairs;with modified independence;with rail(s) PT Goal: Up/Down Stairs - Progress: Goal set today  Visit Information  Last PT Received On: 01/27/13 Assistance Needed: +1    Subjective Data  Subjective: I have to be Independent because he is our only income. Patient Stated Goal: Independent   Prior Functioning  Home Living Lives With: Spouse;Daughter;Other (Comment) (grand children) Available Help at Discharge: Available PRN/intermittently Type of Home: House Home Access: Stairs to enter Entrance Stairs-Number of Steps: 3 Entrance Stairs-Rails: Left;Right Home Layout: One level Bathroom Shower/Tub: Walk-in shower;Door Home Adaptive Equipment: Bedside commode/3-in-1;Walker - rolling;Straight cane Prior Function Level of Independence: Independent Able to Take Stairs?: Yes Driving: Yes Communication Communication: No difficulties    Cognition  Cognition Arousal/Alertness: Awake/alert Behavior During Therapy: WFL for tasks assessed/performed Overall Cognitive Status: Within  Functional Limits for tasks assessed    Extremity/Trunk Assessment Right Lower Extremity Assessment RLE ROM/Strength/Tone: [facility] for tasks  assessed (bil LE weakness, L weaker than R, Hip flexors weak) Left Lower Extremity Assessment LLE ROM/Strength/Tone: WFL for tasks assessed Trunk Assessment Trunk Assessment: Lordotic (hip flexor tightness)   Balance    End of Session PT - End of Session Equipment Utilized During Treatment: Back brace Activity Tolerance: Patient tolerated treatment well Patient left: in bed;with call bell/phone within reach;with family/visitor present Nurse Communication: Mobility status  GP     Mendel Binsfeld, Eliseo Gum 01/27/2013, 6:17 PM 01/27/2013  Atka Bing, PT [phone]  (pager)

## 2013-01-28 MED ORDER — POLYETHYLENE GLYCOL 3350 17 G PO PACK
17.0000 g | PACK | Freq: Every day | ORAL | Status: DC | PRN
  Administered 2013-01-28: 17 g via ORAL
  Filled 2013-01-28: qty 1

## 2013-01-28 NOTE — Progress Notes (Signed)
Physical Therapy Treatment Patient Details Name: Autumn Frye MRN: [id number] DOB: 01-06-1961 Today's Date: 01/28/2013 Time: 2725-3664 PT Time Calculation (min): 15 min  PT Assessment / Plan / Recommendation Comments on Treatment Session  Pt very pleasant & willing to participate in therapy.  Overall moving well.  She states pain in thighs & across pelvis area are worse today than it has been.      Follow Up Recommendations  Home health PT;Supervision - Intermittent     Does the patient have the potential to tolerate intense rehabilitation     Barriers to Discharge        Equipment Recommendations       Recommendations for Other Services    Frequency Min 5X/week   Plan Discharge plan remains appropriate    Precautions / Restrictions Precautions Precautions: Back Precaution Comments: Pt able to verbalize 3/3 back precautions but required cueing with functional mobility to adhere to "no bending" & "no twisting".   Required Braces or Orthoses: Spinal Brace Spinal Brace: Applied in sitting position Restrictions Weight Bearing Restrictions: No       Mobility  Bed Mobility Bed Mobility: Not assessed Transfers Transfers: Sit to Stand;Stand to Sit Sit to Stand: 4: Min guard;With upper extremity assist;With armrests;From chair/3-in-1 Stand to Sit: With upper extremity assist;With armrests;To chair/3-in-1;5: Supervision Details for Transfer Assistance: Cues for hand placement & no twisting when locating armrests of recliner.   Ambulation/Gait Ambulation/Gait Assistance: 5: Supervision Ambulation Distance (Feet): 120 Feet Assistive device: Rolling walker Ambulation/Gait Assistance Details: Pt able to increase ambulation distance from yesterday's session but reports pain across hips & bil upper thighs are worse today.   Pt relies heavily on RW for UE support.  Cues to reinforce "no twisting" when someone talking to her or she is looking at something.   Gait Pattern: Step-through  pattern;Decreased stride length;Decreased hip/knee flexion - right;Decreased hip/knee flexion - left Stairs: No Wheelchair Mobility Wheelchair Mobility: No      PT Goals Acute Rehab PT Goals Time For Goal Achievement: 02/03/13 Potential to Achieve Goals: Good Pt will go Supine/Side to Sit: with modified independence;with HOB 0 degrees Pt will go Sit to Stand: with modified independence PT Goal: Sit to Stand - Progress: Progressing toward goal Pt will Transfer Bed to Chair/Chair to Bed: with modified independence Pt will Ambulate: >150 feet;with modified independence;with least restrictive assistive device PT Goal: Ambulate - Progress: Progressing toward goal Pt will Go Up / Down Stairs: 3-5 stairs;with modified independence;with rail(s)  Visit Information  Last PT Received On: 01/28/13 Assistance Needed: +1    Subjective Data      Cognition  Cognition Arousal/Alertness: Awake/alert Behavior During Therapy: WFL for tasks assessed/performed Overall Cognitive Status: Within Functional Limits for tasks assessed    Balance     End of Session PT - End of Session Equipment Utilized During Treatment: Gait belt;Back brace Activity Tolerance: Patient tolerated treatment well Patient left: in chair;with call bell/phone within reach Nurse Communication: Mobility status     Verdell Face, [place] [phone] 01/28/2013

## 2013-01-28 NOTE — Progress Notes (Signed)
Occupational Therapy Evaluation Patient Details Name: Autumn Frye MRN: [id number] DOB: 1961/04/12 Today's Date: 01/28/2013 Time: 6644-0347 OT Time Calculation (min): 43 min  OT Assessment / Plan / Recommendation Clinical Impression  Patient presents to OT after spinal surgery with decreased ADL independence and safety. Will benefit from skilled OT to facilitate safe d/c home.    OT Assessment  Patient needs continued OT Services    Follow Up Recommendations  Supervision - Intermittent;No OT follow up    Barriers to Discharge Decreased caregiver support    Equipment Recommendations  None recommended by OT (pt has all needed DME)    Recommendations for Other Services    Frequency  Min 2X/week    Precautions / Restrictions Precautions Precautions: Back Precaution Comments: Recalled 3/3 back precautions Required Braces or Orthoses: Spinal Brace Spinal Brace: Applied in sitting position Restrictions Weight Bearing Restrictions: No   Pertinent Vitals/Pain     ADL  Grooming: Performed;Wash/dry hands;Supervision/safety Where Assessed - Grooming: Unsupported standing Upper Body Bathing: Simulated;Set up Where Assessed - Upper Body Bathing: Unsupported sitting Lower Body Bathing: Simulated;Minimal assistance Where Assessed - Lower Body Bathing: Unsupported sitting Upper Body Dressing: Performed;Supervision/safety (brace) Where Assessed - Upper Body Dressing: Unsupported sitting Lower Body Dressing: Simulated;Minimal assistance Where Assessed - Lower Body Dressing: Unsupported sit to stand Toilet Transfer: Performed;Supervision/safety Toilet Transfer Method: Sit to Barista: Raised toilet seat with arms (or 3-in-1 over toilet) Toileting - Clothing Manipulation and Hygiene: Performed;Supervision/safety Where Assessed - Glass blower/designer Manipulation and Hygiene: Standing Equipment Used: Back brace;Rolling walker Transfers/Ambulation Related to ADLs:  Patient performed bed mobility and ADL transfers at S level. ADL Comments: Patient knew about AE but stated she would get family to A with LB self-care. Educated pt about tongs for toilet/peri hygiene.    OT Diagnosis: Generalized weakness;Acute pain  OT Problem List: Pain;Decreased strength OT Treatment Interventions: Self-care/ADL training;DME and/or AE instruction;Patient/family education;Therapeutic activities   OT Goals Acute Rehab OT Goals OT Goal Formulation: With patient Time For Goal Achievement: 01/28/13 Potential to Achieve Goals: Good ADL Goals Pt Will Perform Lower Body Bathing: with modified independence;Sit to stand from chair ADL Goal: Lower Body Bathing - Progress: Goal set today Pt Will Perform Lower Body Dressing: with modified independence;Sit to stand from chair ADL Goal: Lower Body Dressing - Progress: Goal set today Pt Will Transfer to Toilet: with modified independence;3-in-1;with DME;Ambulation ADL Goal: Toilet Transfer - Progress: Goal set today Pt Will Perform Toileting - Clothing Manipulation: with modified independence;Standing ADL Goal: Toileting - Clothing Manipulation - Progress: Goal set today Pt Will Perform Toileting - Hygiene: with modified independence;with adaptive equipment;Standing at 3-in-1/toilet ADL Goal: Toileting - Hygiene - Progress: Goal set today  Visit Information  Last OT Received On: 01/28/13 Assistance Needed: +1    Subjective Data  Subjective: "I am worried about toileting" Patient Stated Goal: to go home   Prior Functioning     Home Living Lives With: Spouse;Daughter;Other (Comment) Available Help at Discharge: Available PRN/intermittently Type of Home: House Home Access: Stairs to enter Entrance Stairs-Number of Steps: 3 Entrance Stairs-Rails: Left;Right Home Layout: One level Bathroom Shower/Tub: Walk-in shower;Door [employer] Toilet: Standard Bathroom Accessibility: Yes How Accessible: Accessible via walker Home  Adaptive Equipment: Bedside commode/3-in-1;Walker - rolling;Straight cane Prior Function Level of Independence: Independent Able to Take Stairs?: Yes Driving: Yes Communication Communication: No difficulties Dominant Hand: Right         Vision/Perception     Cognition  Cognition Arousal/Alertness: Awake/alert Behavior During Therapy: [facility] for  tasks assessed/performed Overall Cognitive Status: Within Functional Limits for tasks assessed    Extremity/Trunk Assessment Right Upper Extremity Assessment RUE ROM/Strength/Tone: [facility] for tasks assessed Left Upper Extremity Assessment LUE ROM/Strength/Tone: WFL for tasks assessed     Mobility Bed Mobility Bed Mobility: Not assessed Transfers Sit to Stand: 4: Min guard;With upper extremity assist;With armrests;From chair/3-in-1 Stand to Sit: With upper extremity assist;With armrests;To chair/3-in-1;5: Supervision Details for Transfer Assistance: Cues for hand placement & no twisting when locating armrests of recliner.       Exercise     Balance     End of Session OT - End of Session Activity Tolerance: Patient tolerated treatment well Patient left: in bed;with call bell/phone within reach;with family/visitor present  GO     Amear Strojny A 01/28/2013, 12:51 PM

## 2013-01-28 NOTE — Progress Notes (Signed)
Patient ID: Autumn Frye, female   DOB: [birth year], 52 y.o.   MRN: [id number] Subjective:  The patient is alert and pleasant. She complains of left leg numbness and back pain.  Objective: Vital signs in last 24 hours: Temp:  [97.9 F (36.6 C)-98.6 F (37 C)] 98.2 F (36.8 C) (05/25 0616) Pulse Rate:  [71-87] 87 (05/25 0616) Resp:  [18-20] 18 (05/25 0616) BP: (98-123)/(58-75) 98/61 mmHg (05/25 0616) SpO2:  [97 %-100 %] 97 % (05/25 0616)  Intake/Output from previous day: 05/24 0701 - 05/25 0700 In: 560 [P.O.:560] Out: 400 [Urine:400] Intake/Output this shift:    Physical exam the patient is alert and oriented. She is moving her lower extremities well. Her dressings are clean and dry.  Lab Results: No results found for this basename: WBC, HGB, HCT, PLT,  in the last 72 hours BMET No results found for this basename: NA, K, CL, CO2, GLUCOSE, BUN, CREATININE, CALCIUM,  in the last 72 hours  Studies/Results: Dg Lumbar Spine 2-3 Views  01/26/2013   *RADIOLOGY REPORT*  Clinical Data: Back pain  LUMBAR SPINE - 2-3 VIEW  Technique:  C-arm fluoroscopic images were obtained intraoperatively and submitted for postoperative interpretation. Please see the performing provider's procedural report for the fluoroscopy time utilized.  Comparison: CT myelogram 12/22/2012.  Findings: C-arm films document L4-L5 XLIF augmented with pedicle screw and rod fixation.  Satisfactory position and alignment.  IMPRESSION: As above.   Original Report Authenticated By: Davonna Belling, M.D.   Dg C-arm Gt 120 Min  01/26/2013   Technique:  C-arm fluoroscopic images were obtained intraoperatively and submitted for postoperative interpretation. Please see the performing provider's procedural report for the fluoroscopy time utilized.   Original Report Authenticated By: Davonna Belling, M.D.    Assessment/Plan: Postop day #2: We will continue to mobilize the patient.  LOS: 2 days     Glade Strausser D 01/28/2013, 9:35  AM

## 2013-01-29 MED ORDER — GABAPENTIN 600 MG PO TABS
300.0000 mg | ORAL_TABLET | Freq: Three times a day (TID) | ORAL | Status: DC
  Filled 2013-01-29: qty 0.5

## 2013-01-29 MED ORDER — DOCUSATE SODIUM 100 MG PO CAPS
100.0000 mg | ORAL_CAPSULE | Freq: Two times a day (BID) | ORAL | Status: DC
  Administered 2013-01-29 – 2013-01-30 (×2): 100 mg via ORAL
  Filled 2013-01-29 (×2): qty 1

## 2013-01-29 MED ORDER — BISACODYL 10 MG RE SUPP
10.0000 mg | Freq: Every day | RECTAL | Status: DC | PRN
  Filled 2013-01-29: qty 1

## 2013-01-29 MED ORDER — GABAPENTIN 300 MG PO CAPS
300.0000 mg | ORAL_CAPSULE | Freq: Three times a day (TID) | ORAL | Status: DC
  Administered 2013-01-29 – 2013-01-30 (×3): 300 mg via ORAL
  Filled 2013-01-29 (×5): qty 1

## 2013-01-29 NOTE — Progress Notes (Signed)
I agree with the following treatment note after reviewing documentation.   Johnston, Arliss Hepburn Brynn   OTR/L Pager: [phone] Office: [phone] .   

## 2013-01-29 NOTE — Progress Notes (Signed)
Patient ID: Autumn Frye, female   DOB: 10-04-1960, 52 y.o.   MRN: [id number] Subjective:  The patient is alert and pleasant. She complains of bilateral thigh pain. She complains of left lower extremity numbness. She wants to go home tomorrow.  Objective: Vital signs in last 24 hours: Temp:  [97.8 F (36.6 C)-98.6 F (37 C)] 97.8 F (36.6 C) (05/26 0546) Pulse Rate:  [63-96] 65 (05/26 0546) Resp:  [18-19] 18 (05/26 0546) BP: (101-126)/(46-71) 101/49 mmHg (05/26 0546) SpO2:  [95 %-100 %] 95 % (05/26 0546)  Intake/Output from previous day:   Intake/Output this shift:    Physical exam the patient is alert and oriented. Her strength is grossly normal in her lower extremities.  Lab Results: No results found for this basename: WBC, HGB, HCT, PLT,  in the last 72 hours BMET No results found for this basename: NA, K, CL, CO2, GLUCOSE, BUN, CREATININE, CALCIUM,  in the last 72 hours  Studies/Results: No results found.  Assessment/Plan: Postop day #3: We will continue to mobilize the patient she will likely go home tomorrow.  LOS: 3 days     Eleesha Purkey D 01/29/2013, 9:53 AM

## 2013-01-29 NOTE — Progress Notes (Signed)
Physical Therapy Treatment Patient Details Name: Autumn Frye MRN: [id number] DOB: 02-Feb-1961 Today's Date: 01/29/2013 Time: 0865-7846 PT Time Calculation (min): 21 min  PT Assessment / Plan / Recommendation Comments on Treatment Session  All education on back care and prec. reinforced.  pt feels confident in her understanding of the information.    Follow Up Recommendations  No PT follow up     Does the patient have the potential to tolerate intense rehabilitation     Barriers to Discharge        Equipment Recommendations  None recommended by PT    Recommendations for Other Services    Frequency Min 5X/week   Plan Discharge plan remains appropriate;Frequency remains appropriate    Precautions / Restrictions Precautions Precautions: Back Precaution Booklet Issued: Yes (comment) Precaution Comments: Recalled 3/3 back precautions Required Braces or Orthoses: Spinal Brace Spinal Brace: Applied in sitting position Restrictions Weight Bearing Restrictions: No   Pertinent Vitals/Pain 10/10 in bil hips at end of session    Mobility  Bed Mobility Bed Mobility: Rolling Left;Left Sidelying to Sit;Sitting - Scoot to Edge of Bed Rolling Left: 5: Supervision Left Sidelying to Sit: 5: Supervision Sitting - Scoot to Edge of Bed: 5: Supervision Details for Bed Mobility Assistance: min cues for safe bed mobility Transfers Transfers: Sit to Stand;Stand to Sit Sit to Stand: 5: Supervision;With upper extremity assist;From bed;From toilet Stand to Sit: 5: Supervision;With upper extremity assist;To chair/3-in-1;To toilet Details for Transfer Assistance: safe mobility Ambulation/Gait Ambulation/Gait Assistance: 5: Supervision Ambulation Distance (Feet): 200 Feet Assistive device: Rolling walker Ambulation/Gait Assistance Details: generally steady if painful in bil hips Gait Pattern: Step-through pattern (improved posture) Stairs: Yes Stairs Assistance: 6: Modified independent  (Device/Increase time) Stair Management Technique: One rail Right;Step to pattern;Forwards Number of Stairs: 3 Wheelchair Mobility Wheelchair Mobility: No    Exercises     PT Diagnosis:    PT Problem List:   PT Treatment Interventions:     PT Goals Acute Rehab PT Goals PT Goal Formulation: With patient Potential to Achieve Goals: Good Pt will go Supine/Side to Sit: with modified independence;with HOB 0 degrees PT Goal: Supine/Side to Sit - Progress: Progressing toward goal Pt will go Sit to Stand: with modified independence PT Goal: Sit to Stand - Progress: Progressing toward goal Pt will Transfer Bed to Chair/Chair to Bed: with modified independence PT Transfer Goal: Bed to Chair/Chair to Bed - Progress: Progressing toward goal Pt will Ambulate: >150 feet;with modified independence;with least restrictive assistive device PT Goal: Ambulate - Progress: Progressing toward goal Pt will Go Up / Down Stairs: 3-5 stairs;with modified independence;with rail(s) PT Goal: Up/Down Stairs - Progress: Met  Visit Information  Last PT Received On: 01/29/13 Assistance Needed: +1    Subjective Data  Subjective: i THINK i'LL BE INDEPENDENT BY TOMORROW   Cognition  Cognition Arousal/Alertness: Awake/alert Behavior During Therapy: WFL for tasks assessed/performed Overall Cognitive Status: Within Functional Limits for tasks assessed    Balance     End of Session PT - End of Session Equipment Utilized During Treatment: Back brace Activity Tolerance: Patient tolerated treatment well Patient left: in chair;with call bell/phone within reach Nurse Communication: Mobility status   GP     Basilio Meadow, Eliseo Gum 01/29/2013, 10:17 AM 01/29/2013  [place] Bing, PT [phone]  (pager)

## 2013-01-29 NOTE — Progress Notes (Signed)
Occupational Therapy Treatment Patient Details Name: Autumn Frye MRN: [id number] DOB: [birth year] Today's Date: 01/29/2013 Time: 4098-1191 OT Time Calculation (min): 39 min  OT Assessment / Plan / Recommendation Comments on Treatment Session Pt is a 52 y/o F who underwent anterolateral fusion of L4-5. Pt performs ADL at supervision level, and only needs min cues for maintaining safety precautions. Pt requested education on toileting and peri care, so AE was discussed and simulated by OTS.    Follow Up Recommendations  Supervision - Intermittent;No OT follow up             Frequency Min 2X/week      Precautions / Restrictions Precautions Precautions: Back Precaution Booklet Issued: Yes (comment) Precaution Comments: Recalled 3/3 back precautions Required Braces or Orthoses: Spinal Brace Spinal Brace: Applied in sitting position Restrictions Weight Bearing Restrictions: No   Pertinent Vitals/Pain Pt reported 8/10 pain, RN notified and Pt took oral pain medication at beginning of therapy session. Pt also reported numbness on inside of left thigh and OTS recommended that she speak to her MD about it.    ADL  Grooming: Performed;Wash/dry hands;Wash/dry face;Teeth care;Set up Where Assessed - Grooming: Unsupported standing;Other (comment) (used LUE for support on sink surface) Toilet Transfer: Research scientist (life sciences) Method: Sit to Barista: Comfort height toilet;Grab bars Toileting - Architect and Hygiene: Performed;Supervision/safety Where Assessed - Engineer, mining and Hygiene: Standing Equipment Used: Back brace;Gait belt;Rolling walker Transfers/Ambulation Related to ADLs: Patient performed bed mobility and ADL transfers at S level. ADL Comments: Pt educated on AE for toilet/peri care. OTS simulated use of 2 options for peri care. Pt already uses wet wipes at home, and has been squatting for peri care.  Pt  performed handwashing/facewashing at set up level standing at sink, brushed teeth and was taught cup method to prevent bending. Pt is going to purchase peri-care tongs from medical supply company/store on their own. Pt educated in features to look for when purchasing (extended length, coating at end)      OT Goals Acute Rehab OT Goals OT Goal Formulation: With patient Potential to Achieve Goals: Good ADL Goals Pt Will Perform Lower Body Bathing: with modified independence;Sit to stand from chair Pt Will Perform Lower Body Dressing: with modified independence;Sit to stand from chair Pt Will Transfer to Toilet: with modified independence;3-in-1;with DME;Ambulation ADL Goal: Toilet Transfer - Progress: Met Pt Will Perform Toileting - Clothing Manipulation: with modified independence;Standing ADL Goal: Toileting - Clothing Manipulation - Progress: Met Pt Will Perform Toileting - Hygiene: with modified independence;with adaptive equipment;Standing at 3-in-1/toilet ADL Goal: Toileting - Hygiene - Progress: Other (comment) (Pt educated on use of AE for peri care)  Visit Information  Last OT Received On: 01/29/13 Assistance Needed: +1          Cognition  Cognition Arousal/Alertness: Awake/alert Behavior During Therapy: WFL for tasks assessed/performed Overall Cognitive Status: Within Functional Limits for tasks assessed    Mobility  Bed Mobility Bed Mobility: Rolling Left;Left Sidelying to Sit;Sitting - Scoot to Edge of Bed Rolling Left: 5: Supervision Left Sidelying to Sit: 5: Supervision Sitting - Scoot to Edge of Bed: 5: Supervision Details for Bed Mobility Assistance: min cues for safe bed mobility Transfers Transfers: Sit to Stand;Stand to Sit Sit to Stand: 5: Supervision;With upper extremity assist;From bed;From toilet Stand to Sit: 5: Supervision;With upper extremity assist;To chair/3-in-1;To toilet Details for Transfer Assistance: safe mobility          End of Session OT  -  End of Session Equipment Utilized During Treatment: Gait belt;Back brace;Other (comment) (RW) Activity Tolerance: Patient tolerated treatment well Patient left: in chair;with call bell/phone within reach Nurse Communication: Mobility status  GO     Sherryl Manges 01/29/2013, 11:27 AM

## 2013-01-30 ENCOUNTER — Encounter (HOSPITAL_COMMUNITY): Payer: Self-pay | Admitting: Neurosurgery

## 2013-01-30 MED ORDER — OXYCODONE-ACETAMINOPHEN 5-325 MG PO TABS: 1.0000 | ORAL_TABLET | ORAL | Status: DC | PRN

## 2013-01-30 MED ORDER — GABAPENTIN 300 MG PO CAPS: 300.0000 mg | ORAL_CAPSULE | Freq: Three times a day (TID) | ORAL | Status: DC

## 2013-01-30 NOTE — Discharge Summary (Signed)
Physician Discharge Summary  Patient ID: Autumn Frye MRN: [id number] DOB/AGE: 52-Jul-1962 52 y.o.  Admit date: 01/26/2013 Discharge date: 01/30/2013  Admission Diagnoses:  Discharge Diagnoses:  Active Problems:   * No active hospital problems. *   Discharged Condition: good  Hospital Course: Surgery 4 days ago for lumbar stenosis, and slippage. Did well post op except for some left leg pain secondary to trans psoas passage. Slowly increased activity. By 4th post op day, ambulating well. Pain easing off a bit with some neurotin. Wounds all healing well. Home with specific instructions given.  Consults: None  Significant Diagnostic Studies: none  Treatments: surgery: L 45 xlif with pedicle screw fixation  Discharge Exam: Blood pressure 123/82, pulse 96, temperature 97.8 F (36.6 C), temperature source Oral, resp. rate 20, height 5\' 2"  (1.575 m), weight 81.647 kg (180 lb), SpO2 98.00%. Incision/Wound:clean and dry; neuro shows 5/5 strength of PF, DF quads. Some giveaway weakness of hip flexors, but can ambulate fine.  Disposition: 03-Skilled Nursing Facility  Discharge Orders   Future Orders Complete By Expires     Call MD for:  difficulty breathing, headache or visual disturbances  As directed     Call MD for:  hives  As directed     Call MD for:  persistant nausea and vomiting  As directed     Call MD for:  redness, tenderness, or signs of infection (pain, swelling, redness, odor or green/yellow discharge around incision site)  As directed     Call MD for:  severe uncontrolled pain  As directed     Call MD for:  temperature >100.4  As directed     Diet general  As directed     Discharge instructions  As directed     Comments:      Mostly bedrest. Get up 9 or 10 times each day and walk for 15-20 minutes each time. Very little sitting the first week. No riding in the car until your first post op appointment. If you had neck surgery...may shower from the chest down. If you  had low back surgery....you may shower with a saran wrap covering over the incision. Take your pain medicine as needed...and other medicines that you are instructed to take. Call for an appointment...[phone].        Medication List    STOP taking these medications       aspirin EC 325 MG tablet     Biotin 5000 MCG Tabs     clindamycin 150 MG capsule  Commonly known as:  CLEOCIN     doxycycline 50 MG tablet  Commonly known as:  ADOXA     OVER THE COUNTER MEDICATION     oxyCODONE 5 MG immediate release tablet  Commonly known as:  Oxy IR/ROXICODONE      TAKE these medications       baclofen 20 MG tablet  Commonly known as:  LIORESAL  Take 10-20 mg by mouth 3 (three) times daily as needed (for back spasms).     CITRACAL + D PO  Take 1 tablet by mouth daily.     ezetimibe 10 MG tablet  Commonly known as:  ZETIA  Take 10 mg by mouth daily.     FEMRING 0.1 MG/24HR Ring  Generic drug:  Estradiol Acetate  Place 1 each vaginally every 3 (three) months.     folic acid 1 MG tablet  Commonly known as:  FOLVITE  Take 2 mg by mouth daily.  furosemide 40 MG tablet  Commonly known as:  LASIX  Take 40 mg by mouth daily.     gabapentin 300 MG capsule  Commonly known as:  NEURONTIN  Take 1 capsule (300 mg total) by mouth 3 (three) times daily.     levothyroxine 125 MCG tablet  Commonly known as:  SYNTHROID, LEVOTHROID  Take 125 mcg by mouth daily.     loratadine 10 MG tablet  Commonly known as:  CLARITIN  Take 10 mg by mouth daily.     Magnesium Oxide 500 MG Tabs  Take 500 mg by mouth daily.     methotrexate 1 G injection  Inject as directed once. 0.06 mg injection every Thursday     oxyCODONE-acetaminophen 5-325 MG per tablet  Commonly known as:  PERCOCET/ROXICET  Take 1-2 tablets by mouth every 4 (four) hours as needed for pain.     predniSONE 5 MG tablet  Commonly known as:  DELTASONE  Take 10 mg by mouth daily.     PROAIR HFA 108 (90 BASE) MCG/ACT  inhaler  Generic drug:  albuterol  Inhale 2 puffs into the lungs every 6 (six) hours as needed for wheezing or shortness of breath.     SIMPONI Catharine  Inject into the skin. Injection montly     Vitamin D3 5000 UNITS Tabs  Take 5,000 mg by mouth daily.         At home rest most of the time. Get up 9 or 10 times each day and take a 15 or 20 minute walk. No riding in the car and to your first postoperative appointment. If you have neck surgery you may shower from the chest down starting on the third postoperative day. If you had back surgery he may start showering on the third postoperative day with saran wrap wrapped around your incisional area 3 times. After the shower remove the saran wrap. Take pain medicine as needed and other medications as instructed. Call my office for an appointment.  Signed: Reinaldo Meeker, MD 01/30/2013, 2:20 PM

## 2013-01-30 NOTE — Progress Notes (Signed)
Patient was complaining of sharp burning pain on Left thigh radiating to below knee. Notified Dr. Lovell Sheehan. Started on Gabapentin. See MAR.

## 2013-01-30 NOTE — Progress Notes (Signed)
Pt given D/C instructions with Rx, verbal understanding given by Pt. Rema Fendt, RN

## 2013-01-30 NOTE — Progress Notes (Signed)
Physical Therapy Treatment Patient Details Name: Autumn Frye MRN: [id number] DOB: [birth year] Today's Date: 01/30/2013 Time: 4098-1191 PT Time Calculation (min): 14 min  PT Assessment / Plan / Recommendation Comments on Treatment Session  reinforced education, she has gotten a little lax on her precautions.    Follow Up Recommendations  No PT follow up     Does the patient have the potential to tolerate intense rehabilitation     Barriers to Discharge        Equipment Recommendations  None recommended by PT    Recommendations for Other Services    Frequency Min 5X/week   Plan Discharge plan remains appropriate;Frequency remains appropriate    Precautions / Restrictions Precautions Precaution Comments: Recalled 3/3 back precautions Required Braces or Orthoses: Spinal Brace Spinal Brace: Applied in sitting position Restrictions Weight Bearing Restrictions: No   Pertinent Vitals/Pain     Mobility  Bed Mobility Bed Mobility: Rolling Left;Left Sidelying to Sit;Sitting - Scoot to Edge of Bed Rolling Left: 7: Independent Left Sidelying to Sit: 6: Modified independent (Device/Increase time) Sitting - Scoot to Edge of Bed: 6: Modified independent (Device/Increase time) Sit to Sidelying Left: 6: Modified independent (Device/Increase time) Details for Bed Mobility Assistance: cues to watch the arching while positioning in bed Transfers Transfers: Sit to Stand;Stand to Sit Sit to Stand: With upper extremity assist;From bed;From toilet;6: Modified independent (Device/Increase time) Stand to Sit: 5: Supervision;With upper extremity assist;To chair/3-in-1;To toilet Details for Transfer Assistance: safe mobility Ambulation/Gait Ambulation/Gait Assistance: 5: Supervision Ambulation Distance (Feet): 180 Feet Assistive device: Rolling walker Ambulation/Gait Assistance Details: mildly flexed posture with increased pain; generally steady Gait Pattern: Step-through pattern Stairs:  No    Exercises     PT Diagnosis:    PT Problem List:   PT Treatment Interventions:     PT Goals Acute Rehab PT Goals Time For Goal Achievement: 02/03/13 Potential to Achieve Goals: Good Pt will go Supine/Side to Sit: with modified independence;with HOB 0 degrees PT Goal: Supine/Side to Sit - Progress: Met Pt will go Sit to Stand: with modified independence PT Goal: Sit to Stand - Progress: Met Pt will Transfer Bed to Chair/Chair to Bed: with modified independence PT Transfer Goal: Bed to Chair/Chair to Bed - Progress: Met Pt will Ambulate: >150 feet;with modified independence;with least restrictive assistive device PT Goal: Ambulate - Progress: Progressing toward goal Pt will Go Up / Down Stairs: 3-5 stairs;with modified independence;with rail(s) PT Goal: Up/Down Stairs - Progress: Not met  Visit Information  Last PT Received On: 01/30/13 Assistance Needed: +1    Subjective Data  Subjective: The pain has gotten worse on the L LE...not just numbness, now it's pain snaking down, Patient Stated Goal: Independent   Cognition  Cognition Arousal/Alertness: Awake/alert Behavior During Therapy: WFL for tasks assessed/performed Overall Cognitive Status: Within Functional Limits for tasks assessed    Balance  Balance Balance Assessed: No  End of Session PT - End of Session Equipment Utilized During Treatment: Back brace Activity Tolerance: Patient tolerated treatment well Patient left: in bed;with call bell/phone within reach Nurse Communication: Mobility status   GP     Achol Azpeitia, Eliseo Gum 01/30/2013, 1:09 PM 01/30/2013  [place] Bing, PT [phone]  (pager)

## 2013-01-31 NOTE — Care Management Note (Signed)
    Page 1 of 1   01/31/2013     8:32:31 AM   CARE MANAGEMENT NOTE 01/31/2013  Patient:  Autumn Frye   Account Number:  [id number]  Date Initiated:  01/31/2013  Documentation initiated by:  [facility]  Subjective/Objective Assessment:   admitted postop ALIF L4-5     Action/Plan:   PT/OT evals-no folllow up  recommended   Anticipated DC Date:     Anticipated DC Plan:        DC Planning Services  CM consult      Choice offered to / List presented to:             Status of service:  Completed, signed off Medicare Important Message given?   (If response is "NO", the following Medicare IM given date fields will be blank) Date Medicare IM given:   Date Additional Medicare IM given:    Discharge Disposition:  HOME/SELF CARE  Per UR Regulation:  Reviewed for med. necessity/level of care/duration of stay  If discussed at Long Length of Stay Meetings, dates discussed:    Comments:

## 2013-04-04 ENCOUNTER — Encounter (HOSPITAL_BASED_OUTPATIENT_CLINIC_OR_DEPARTMENT_OTHER): Payer: Medicare Other | Attending: General Surgery

## 2013-04-04 DIAGNOSIS — Y838 Other surgical procedures as the cause of abnormal reaction of the patient, or of later complication, without mention of misadventure at the time of the procedure: Secondary | ICD-10-CM | POA: Insufficient documentation

## 2013-04-04 DIAGNOSIS — Z8619 Personal history of other infectious and parasitic diseases: Secondary | ICD-10-CM | POA: Insufficient documentation

## 2013-04-04 DIAGNOSIS — T8189XA Other complications of procedures, not elsewhere classified, initial encounter: Secondary | ICD-10-CM | POA: Insufficient documentation

## 2013-04-04 DIAGNOSIS — M069 Rheumatoid arthritis, unspecified: Secondary | ICD-10-CM | POA: Insufficient documentation

## 2013-04-05 NOTE — Progress Notes (Signed)
[facility]  NAME:  [name], [name] NO.:  [id number]  MEDICAL RECORD NO.:  [id number]      DATE OF BIRTH:  November 04, 1960  PHYSICIAN:  Ardath Sax, M.D.      VISIT DATE:  04/04/2013                                  OFFICE VISIT   SUBJECTIVE:  Autumn Frye is a 52 year old obese lady, who enters here because of a nonhealing surgical wound on her left flank.  This apparently was an operative site for neurosurgeon doing a repair for spondylolisthesis of L4-L5.  There was also a wound in the midline in the back that has healed nicely, but this wound, which is only about 4-5 mm in diameter, is probably the site for a scope or else a tool to use during the surgery to put a plate in her back.  This wound is being treated now with doxycycline by her doctor but it has been months with this nonhealing wound.  She weighs 175 pounds.  She is 5 feet 2 inches.  Her blood pressure is 111/75, respirations 16, pulse 96, temperature 98.5.  She is also on many medicines including Synthroid, aspirin, doxycycline, amitriptyline, Zetia, __________, Simponi, methotrexate for her rheumatoid arthritis, and hydrochlorothiazide.  She is allergic to many things including penicillin, Septra, and Cipro.  Her other diagnoses are rheumatoid arthritis, hyperlipidemia, hepatitis A.  She has also had a hysterectomy and arthroscopy of the left knee and a right knee replacement.  I am going to leave her on the doxycycline.  We did get a culture of this, and I will start her on Santyl dressings, and see her back in a week.  Her diagnosis is a nonhealing surgical wound from an operation on her back for spondylolisthesis.  Other diagnoses are rheumatoid arthritis, history of hepatitis C.     Ardath Sax, M.D.     PP/MEDQ  D:  04/04/2013  T:  04/05/2013  Job:  [id number]

## 2013-04-11 ENCOUNTER — Encounter (HOSPITAL_BASED_OUTPATIENT_CLINIC_OR_DEPARTMENT_OTHER): Payer: Medicare Other | Attending: General Surgery

## 2013-04-11 DIAGNOSIS — T8189XA Other complications of procedures, not elsewhere classified, initial encounter: Secondary | ICD-10-CM | POA: Insufficient documentation

## 2013-04-11 DIAGNOSIS — Y838 Other surgical procedures as the cause of abnormal reaction of the patient, or of later complication, without mention of misadventure at the time of the procedure: Secondary | ICD-10-CM | POA: Insufficient documentation

## 2013-05-09 ENCOUNTER — Encounter (HOSPITAL_BASED_OUTPATIENT_CLINIC_OR_DEPARTMENT_OTHER): Payer: Medicare Other | Attending: General Surgery

## 2013-05-09 DIAGNOSIS — Y838 Other surgical procedures as the cause of abnormal reaction of the patient, or of later complication, without mention of misadventure at the time of the procedure: Secondary | ICD-10-CM | POA: Insufficient documentation

## 2013-05-09 DIAGNOSIS — T8189XA Other complications of procedures, not elsewhere classified, initial encounter: Secondary | ICD-10-CM | POA: Insufficient documentation

## 2013-06-17 ENCOUNTER — Encounter: Payer: Self-pay | Admitting: Orthopedic Surgery

## 2013-06-22 ENCOUNTER — Encounter: Payer: Self-pay | Admitting: Internal Medicine

## 2013-07-10 ENCOUNTER — Encounter: Payer: Self-pay | Admitting: Emergency Medicine

## 2013-07-10 ENCOUNTER — Encounter (INDEPENDENT_AMBULATORY_CARE_PROVIDER_SITE_OTHER): Payer: Self-pay

## 2013-07-10 ENCOUNTER — Ambulatory Visit: Payer: Medicare Other | Admitting: Emergency Medicine

## 2013-07-10 VITALS — BP 108/70 | HR 78 | Temp 97.0°F | Resp 18 | Wt 195.0 lb

## 2013-07-10 DIAGNOSIS — E039 Hypothyroidism, unspecified: Secondary | ICD-10-CM

## 2013-07-10 DIAGNOSIS — B370 Candidal stomatitis: Secondary | ICD-10-CM

## 2013-07-10 DIAGNOSIS — E785 Hyperlipidemia, unspecified: Secondary | ICD-10-CM

## 2013-07-10 DIAGNOSIS — E559 Vitamin D deficiency, unspecified: Secondary | ICD-10-CM

## 2013-07-10 DIAGNOSIS — J45909 Unspecified asthma, uncomplicated: Secondary | ICD-10-CM | POA: Insufficient documentation

## 2013-07-10 DIAGNOSIS — I10 Essential (primary) hypertension: Secondary | ICD-10-CM | POA: Insufficient documentation

## 2013-07-10 DIAGNOSIS — R5381 Other malaise: Secondary | ICD-10-CM

## 2013-07-10 DIAGNOSIS — R7309 Other abnormal glucose: Secondary | ICD-10-CM

## 2013-07-10 LAB — HEPATIC FUNCTION PANEL
AST: 26 U/L (ref 0–37)
Alkaline Phosphatase: 74 U/L (ref 39–117)
Indirect Bilirubin: 0.2 mg/dL (ref 0.0–0.9)
Total Bilirubin: 0.3 mg/dL (ref 0.3–1.2)

## 2013-07-10 LAB — CBC WITH DIFFERENTIAL/PLATELET
Basophils Absolute: 0 10*3/uL (ref 0.0–0.1)
Eosinophils Relative: 1 % (ref 0–5)
HCT: 41.9 % (ref 36.0–46.0)
Lymphocytes Relative: 55 % — ABNORMAL HIGH (ref 12–46)
Lymphs Abs: 4.7 10*3/uL — ABNORMAL HIGH (ref 0.7–4.0)
MCV: 90.1 fL (ref 78.0–100.0)
Monocytes Absolute: 0.4 10*3/uL (ref 0.1–1.0)
Neutro Abs: 3.4 10*3/uL (ref 1.7–7.7)
RBC: 4.65 MIL/uL (ref 3.87–5.11)
RDW: 16.6 % — ABNORMAL HIGH (ref 11.5–15.5)
WBC: 8.6 10*3/uL (ref 4.0–10.5)

## 2013-07-10 LAB — LIPID PANEL
HDL: 60 mg/dL (ref >39)
LDL Cholesterol: 119 mg/dL — ABNORMAL HIGH (ref 0–99)
Total CHOL/HDL Ratio: 3.4 Ratio

## 2013-07-10 LAB — BASIC METABOLIC PANEL WITH GFR
BUN: 13 mg/dL (ref 6–23)
CO2: 26 mEq/L (ref 19–32)
Chloride: 102 mEq/L (ref 96–112)
GFR, Est Non African American: >89 mL/min
Potassium: 4.3 mEq/L (ref 3.5–5.3)

## 2013-07-10 LAB — HEMOGLOBIN A1C
Hgb A1c MFr Bld: 5.4 % (ref <5.7)
Mean Plasma Glucose: 108 mg/dL (ref <117)

## 2013-07-10 MED ORDER — NYSTATIN 100000 UNIT/ML MT SUSP: 5.0000 mL | Freq: Four times a day (QID) | OROMUCOSAL | Status: DC

## 2013-07-10 MED ORDER — FLUCONAZOLE 100 MG PO TABS: 150.0000 mg | ORAL_TABLET | ORAL | Status: AC

## 2013-07-10 NOTE — Patient Instructions (Signed)
Fatigue Fatigue is a feeling of tiredness, lack of energy, lack of motivation, or feeling tired all the time. Having enough rest, good nutrition, and reducing stress will normally reduce fatigue. Consult your caregiver if it persists. The nature of your fatigue will help your caregiver to find out its cause. The treatment is based on the cause.  CAUSES  There are many causes for fatigue. Most of the time, fatigue can be traced to one or more of your habits or routines. Most causes fit into one or more of three general areas. They are: Lifestyle problems  Sleep disturbances.  Overwork.  Physical exertion.  Unhealthy habits.  Poor eating habits or eating disorders.  Alcohol and/or drug use .  Lack of proper nutrition (malnutrition). Psychological problems  Stress and/or anxiety problems.  Depression.  Grief.  Boredom. Medical Problems or Conditions  Anemia.  Pregnancy.  Thyroid gland problems.  Recovery from major surgery.  Continuous pain.  Emphysema or asthma that is not well controlled  Allergic conditions.  Diabetes.  Infections (such as mononucleosis).  Obesity.  Sleep disorders, such as sleep apnea.  Heart failure or other heart-related problems.  Cancer.  Kidney disease.  Liver disease.  Effects of certain medicines such as antihistamines, cough and cold remedies, prescription pain medicines, heart and blood pressure medicines, drugs used for treatment of cancer, and some antidepressants. SYMPTOMS  The symptoms of fatigue include:   Lack of energy.  Lack of drive (motivation).  Drowsiness.  Feeling of indifference to the surroundings. DIAGNOSIS  The details of how you feel help guide your caregiver in finding out what is causing the fatigue. You will be asked about your present and past health condition. It is important to review all medicines that you take, including prescription and non-prescription items. A thorough exam will be done.  You will be questioned about your feelings, habits, and normal lifestyle. Your caregiver may suggest blood tests, urine tests, or other tests to look for common medical causes of fatigue.  TREATMENT  Fatigue is treated by correcting the underlying cause. For example, if you have continuous pain or depression, treating these causes will improve how you feel. Similarly, adjusting the dose of certain medicines will help in reducing fatigue.  HOME CARE INSTRUCTIONS   Try to get the required amount of good sleep every night.  Eat a healthy and nutritious diet, and drink enough water throughout the day.  Practice ways of relaxing (including yoga or meditation).  Exercise regularly.  Make plans to change situations that cause stress. Act on those plans so that stresses decrease over time. Keep your work and personal routine reasonable.  Avoid street drugs and minimize use of alcohol.  Start taking a daily multivitamin after consulting your caregiver. SEEK MEDICAL CARE IF:   You have persistent tiredness, which cannot be accounted for.  You have fever.  You have unintentional weight loss.  You have headaches.  You have disturbed sleep throughout the night.  You are feeling sad.  You have constipation.  You have dry skin.  You have gained weight.  You are taking any new or different medicines that you suspect are causing fatigue.  You are unable to sleep at night.  You develop any unusual swelling of your legs or other parts of your body. SEEK IMMEDIATE MEDICAL CARE IF:   You are feeling confused.  Your vision is blurred.  You feel faint or pass out.  You develop severe headache.  You develop severe abdominal, pelvic, or   back pain.  You develop chest pain, shortness of breath, or an irregular or fast heartbeat.  You are unable to pass a normal amount of urine.  You develop abnormal bleeding such as bleeding from the rectum or you vomit blood.  You have thoughts  about harming yourself or committing suicide.  You are worried that you might harm someone else. MAKE SURE YOU:   Understand these instructions.  Will watch your condition.  Will get help right away if you are not doing well or get worse. Document Released: 06/20/2007 Document Revised: 11/15/2011 Document Reviewed: 06/20/2007 [facility] Patient Information 2014 [place], [place]. Thrush, Adult  Autumn Frye is a yeast infection that develops in the mouth and throat and on the tongue. The medical term for this is oropharyngeal candidiasis, or OPC. Autumn Frye is most common in older adults, but it can occur at any age. Autumn Frye occurs when a yeast called candida grows out of control. Candida normally is present in small amounts in the mouth and on other mucous membranes. However, under certain circumstances, candida can grow rapidly, causing thrush. Autumn Frye can be a recurring problem for people who have chronic illnesses or who take medications that limit the body's ability to fight infection (weakened immune system). Since these people have difficulty fighting infections, the fungus that causes thrush can spread throughout the body. This can cause life-threatening blood or organ infections. CAUSES  Candida, the yeast that causes thrush, is normally present in small amounts in the mouth and on other mucous membranes. It usually causes no harm. However, when conditions are present that allow the yeast to grow uncontrolled, it invades surrounding tissues and becomes an infection. Autumn Frye is most commonly caused by the yeast Candida albicans. Less often, other forms of candida can lead to thrush. There are many types of bacteria in your mouth that normally control the growth of candida. Sometimes a new type of bacteria gets into your mouth and disrupts the balance of the germs already there. This can allow candida to overgrow. Other factors that increase your risk of developing thrush include:  An impaired ability to  fight infection (weakened immune system). A normal immune system is usually strong enough to prevent candida from overgrowing.  Older adults are more likely to develop thrush because they may have weaker immune systems.  People with human immunodeficiency virus (HIV) infection have a high likelihood of developing thrush. About 90% of people with HIV develop thrush at some point during the course of their disease.  People with diabetes are more likely to get thrush because high blood sugar levels promote overgrowth of the candida fungus.  A dry mouth (xerostomia). Dry mouth can result from overuse of mouthwashes or from certain conditions such as Sjgren's syndrome.  Pregnancy. Hormone changes during pregnancy can lead to thrush by altering the balance of bacteria in the mouth.  Poor dental care, especially in people who have false teeth.  The use of antibiotic medications. This may lead to thrush by changing the balance of bacteria in the mouth. SYMPTOMS  Thrush can be a mild infection that causes no symptoms. If symptoms develop, they may include the following:  A burning feeling in the mouth and throat. This can occur at the start of a thrush infection.  White patches that adhere to the mouth and tongue. The tissue around the patches may be red, raw, and painful. If rubbed (during tooth brushing, for example), the patches and the tissue of the mouth may bleed easily.  A bad  taste in the mouth or difficulty tasting foods.  Cottony feeling in the mouth.  Sometimes pain during eating and swallowing. DIAGNOSIS  Your caregiver can usually diagnose thrush by exam. In addition to looking in your mouth, your caregiver will ask you questions about your health. TREATMENT  Medications that help prevent the growth of fungi (antifungals) are the standard treatment for thrush. These medications are either applied directly to the affected area (topical) or swallowed (oral). Mild thrush In adults,  mild cases of thrush may clear up with simple treatment that can be done at home. This treatment usually involves using an antifungal mouth rinse or lozenges. Treatment usually lasts about 14 days. Moderate to severe thrush  More severe thrush infections that have spread to the esophagus are treated with an oral antifungal medication. A topical antifungal medication may also be used.  For some severe infections, a treatment period longer than 14 days may be needed.  Oral antifungal medications are almost never used during pregnancy because the fetus may be harmed. However, if a pregnant woman has a rare, severe thrush infection that has spread to her blood, oral antifungal medications may be used. In this case, the risk of harm to the mother and fetus from the severe thrush infection may be greater than the risk posed by the use of antifungal medications. Persistent or recurrent thrush Persistent (does not go away) or recurrent (keeps coming back) cases of thrush may:  Need to be treated twice as long as the symptoms last.  Require treatment with both oral and topical antifungal medications.  People with weakened immune systems can take an antifungal medication on a continuous basis to prevent thrush infections. It is important to treat conditions that make you more likely to get thrush, such as diabetes, human immunodeficiency virus (HIV), or cancer.  HOME CARE INSTRUCTIONS   If you are breast-feeding, you should clean your nipples with an antifungal medication, such as nystatin (Mycostatin). Dry your nipples after breast-feeding. Applying lanolin-containing body lotion may help relieve nipple soreness.  If you wear dentures and get thrush, remove dentures before going to bed, brush them vigorously, and soak in a solution of chlorhexidine gluconate or a product such as Polident or Efferdent.  Eating plain, unflavored yogurt that contains live cultures (check the label) can also help cure  thrush. Yogurt helps healthy bacteria grow in the mouth. These bacteria stop the growth of the yeast that causes thrush.  Adults can treat thrush at home with gentian violet (1%), a dye that kills bacteria and fungi. It is available without a prescription. If there is no known cause for the infection or if gentian violet does not cure the thrush, you need to see your caregiver. Comfort measures Measures can be taken to reduce the discomfort of thrush:  Drink cold liquids such as water or iced tea. Eat flavored ice treats or frozen juices.  Eat foods that are easy to swallow such as gelatin, ice cream, or custard.  If the patches are painful, try drinking from a straw.  Rinse your mouth several times a day with a warm saltwater rinse. You can make the saltwater mixture with 1 tsp (5 g) of salt in 8 fl oz (0.2 L) of warm water. PROGNOSIS   Most cases of thrush are mild and clear up with the use of an antifungal mouth rinse or lozenges. Very mild cases of thrush may clear up without medical treatment. It usually takes about 14 days of treatment with an  oral antifungal medication to cure more severe thrush infections. In some cases, thrush may last several weeks even with treatment.  If thrush goes untreated and does not go away by itself, it can spread to other parts of the body.  Thrush can spread to the throat, the vagina, or the skin. It rarely spreads to other organs of the body. Autumn Frye is more likely to recur (come back) in:  People who use inhaled corticosteroids to treat asthma.  People who take antibiotic medications for a long time.  People who have false teeth.  People who have a weakened immune system. RISKS AND COMPLICATIONS Complications related to thrush are rare in healthy people. There are several factors that can increase your risk of developing thrush. Age Older adults, especially those who have serious health problems, are more likely to develop thrush because their  immune systems are likely to be weaker. Behavior  The yeast that causes thrush can be spread by oral sex.  Heavy smoking can lower the body's ability to fight off infections. This makes thrush more likely to develop. Other conditions  False teeth (dentures), braces, or a retainer that irritates the mouth make it hard to keep the mouth clean. An unclean mouth is more likely to develop thrush than a clean mouth.  People with a weakened immune system, such as those who have diabetes or human immunodeficiency virus (HIV) or who are undergoing chemotherapy, have an increased risk for developing thrush. Medications Some medications can allow the fungus that causes thrush to grow uncontrolled. Common ones are:  Antibiotics, especially those that kill a wide range of organisms (broad-spectrum antibiotics), such as tetracycline commonly can cause thrush.  Birth control pills (oral contraceptives).  Medications that weaken the body's immune system, such as corticosteroids. Environment Exposure over time to certain environmental chemicals, such as benzene and pesticides, can weaken the body's immune system. This increases your risk for developing infections, including thrush. SEEK IMMEDIATE MEDICAL CARE IF:  Your symptoms are getting worse or are not improving within 7 days of starting treatment.  You have symptoms of spreading infection, such as white patches on the skin outside of the mouth.  You are nursing and you have redness and pain in the nipples in spite of home treatment or if you have burning pain in the nipple area when you nurse. Your baby's mouth should also be examined to determine whether thrush is causing your symptoms. Document Released: 05/18/2004 Document Revised: 11/15/2011 Document Reviewed: 08/28/2008 [facility] Patient Information 2014 [place], [place]. Blood Sugar Monitoring, Adult GLUCOSE METERS FOR SELF-MONITORING OF BLOOD GLUCOSE  It is important to be able to correctly  measure your blood sugar (glucose). You can use a blood glucose monitor (a small battery-operated device) to check your glucose level at any time. This allows you and your caregiver to monitor your diabetes and to determine how well your treatment plan is working. The process of monitoring your blood glucose with a glucose meter is called self-monitoring of blood glucose (SMBG). When people with diabetes control their blood sugar, they have better health. To test for glucose with a typical glucose meter, place the disposable strip in the meter. Then place a small sample of blood on the "test strip." The test strip is coated with chemicals that combine with glucose in blood. The meter measures how much glucose is present. The meter displays the glucose level as a number. Several new models can record and store a number of test results. Some models can connect  to personal computers to store test results or print them out.  Newer meters are often easier to use than older models. Some meters allow you to get blood from places other than your fingertip. Some new models have automatic timing, error codes, signals, or barcode readers to help with proper adjustment (calibration). Some meters have a large display screen or spoken instructions for people with visual impairments.  INSTRUCTIONS FOR USING GLUCOSE METERS  Wash your hands with soap and warm water, or clean the area with alcohol. Dry your hands completely.  Prick the side of your fingertip with a lancet (a sharp-pointed tool used by hand).  Hold the hand down and gently milk the finger until a small drop of blood appears. Catch the blood with the test strip.  Follow the instructions for inserting the test strip and using the SMBG meter. Most meters require the meter to be turned on and the test strip to be inserted before applying the blood sample.  Record the test result.  Read the instructions carefully for both the meter and the test strips that  go with it. Meter instructions are found in the user manual. Keep this manual to help you solve any problems that may arise. Many meters use "error codes" when there is a problem with the meter, the test strip, or the blood sample on the strip. You will need the manual to understand these error codes and fix the problem.  New devices are available such as laser lancets and meters that can test blood taken from "alternative sites" of the body, other than fingertips. However, you should use standard fingertip testing if your glucose changes rapidly. Also, use standard testing if:  You have eaten, exercised, or taken insulin in the past 2 hours.  You think your glucose is low.  You tend to not feel symptoms of low blood glucose (hypoglycemia).  You are ill or under stress.  Clean the meter as directed by the manufacturer.  Test the meter for accuracy as directed by the manufacturer.  Take your meter with you to your caregiver's office. This way, you can test your glucose in front of your caregiver to make sure you are using the meter correctly. Your caregiver can also take a sample of blood to test using a routine lab method. If values on the glucose meter are close to the lab results, you and your caregiver will see that your meter is working well and you are using good technique. Your caregiver will advise you about what to do if the results do not match. FREQUENCY OF TESTING  Your caregiver will tell you how often you should check your blood glucose. This will depend on your type of diabetes, your current level of diabetes control, and your types of medicines. The following are general guidelines, but your care plan may be different. Record all your readings and the time of day you took them for review with your caregiver.   Diabetes type 1.  When you are using insulin with good diabetic control (either multiple daily injections or via a pump), you should check your glucose 4 times a day.  If  your diabetes is not well controlled, you may need to monitor more frequently, including before meals and 2 hours after meals, at bedtime, and occasionally between 2 a.m. and 3 a.m.  You should always check your glucose before a dose of insulin or before changing the rate on your insulin pump.  Diabetes type 2.  Guidelines for  SMBG in diabetes type 2 are not as well defined.  If you are on insulin, follow the guidelines above.  If you are on medicines, but not insulin, and your glucose is not well controlled, you should test at least twice daily.  If you are not on insulin, and your diabetes is controlled with medicines or diet alone, you should test at least once daily, usually before breakfast.  A weekly profile will help your caregiver advise you on your care plan. The week before your visit, check your glucose before a meal and 2 hours after a meal at least daily. You may want to test before and after a different meal each day so you and your caregiver can tell how well controlled your blood sugars are throughout the course of a 24 hour period.  Gestational diabetes (diabetes during pregnancy).  Frequent testing is often necessary. Accurate timing is important.  If you are not on insulin, check your glucose 4 times a day. Check it before breakfast and 1 hour after the start of each meal.  If you are on insulin, check your glucose 6 times a day. Check it before each meal and 1 hour after the first bite of each meal.  General guidelines.  More frequent testing is required at the start of insulin treatment. Your caregiver will instruct you.  Test your glucose any time you suspect you have low blood sugar (hypoglycemia).  You should test more often when you change medicines, when you have unusual stress or illness, or in other unusual circumstances. OTHER THINGS TO KNOW ABOUT GLUCOSE METERS  Measurement Range. Most glucose meters are able to read glucose levels over a broad range of  values from as low as 0 to as high as 600 mg/dL. If you get an extremely high or low reading from your meter, you should first confirm it with another reading. Report very high or very low readings to your caregiver.  Whole Blood Glucose versus Plasma Glucose. Some older home glucose meters measure glucose in your whole blood. In a lab or when using some newer home glucose meters, the glucose is measured in your plasma (one component of blood). The difference can be important. It is important for you and your caregiver to know whether your meter gives its results as "whole blood equivalent" or "plasma equivalent."  Display of High and Low Glucose Values. Part of learning how to operate a meter is understanding what the meter results mean. Know how high and low glucose concentrations are displayed on your meter.  Factors that Affect Glucose Meter Performance. The accuracy of your test results depends on many factors and varies depending on the brand and type of meter. These factors include:  Low red blood cell count (anemia).  Substances in your blood (such as uric acid, vitamin C, and others).  Environmental factors (temperature, humidity, altitude).  Name-brand versus generic test strips.  Calibration. Make sure your meter is set up properly. It is a good idea to do a calibration test with a control solution recommended by the manufacturer of your meter whenever you begin using a fresh bottle of test strips. This will help verify the accuracy of your meter.  Improperly stored, expired, or defective test strips. Keep your strips in a dry place with the lid on.  Soiled meter.  Inadequate blood sample. NEW TECHNOLOGIES FOR GLUCOSE TESTING Alternative site testing Some glucose meters allow testing blood from alternative sites. These include the:  Upper arm.  Forearm.  Base of the thumb.  Thigh. Sampling blood from alternative sites may be desirable. However, it may have some  limitations. Blood in the fingertips show changes in glucose levels more quickly than blood in other parts of the body. This means that alternative site test results may be different from fingertip test results, not because of the meter's ability to test accurately, but because the actual glucose concentration can be different.  Continuous Glucose Monitoring Devices to measure your blood glucose continuously are available, and others are in development. These methods can be more expensive than self-monitoring with a glucose meter. However, it is uncertain how effective and reliable these devices are. Your caregiver will advise you if this approach makes sense for you. IF BLOOD SUGARS ARE CONTROLLED, PEOPLE WITH DIABETES REMAIN HEALTHIER.  SMBG is an important part of the treatment plan of patients with diabetes mellitus. Below are reasons for using SMBG:   It confirms that your glucose is at a specific, healthy level.  It detects hypoglycemia and severe hyperglycemia.  It allows you and your caregiver to make adjustments in response to changes in lifestyle for individuals requiring medicine.  It determines the need for starting insulin therapy in temporary diabetes that happens during pregnancy (gestational diabetes). Document Released: 08/26/2003 Document Revised: 11/15/2011 Document Reviewed: 12/17/2010 [facility] Patient Information 2014 [place], [place]. Hypertriglyceridemia  Diet for High blood levels of Triglycerides Most fats in food are triglycerides. Triglycerides in your blood are stored as fat in your body. High levels of triglycerides in your blood may put you at a greater risk for heart disease and stroke.  Normal triglyceride levels are less than 150 mg/dL. Borderline high levels are 150-199 mg/dl. High levels are 200 - 499 mg/dL, and very high triglyceride levels are greater than 500 mg/dL. The decision to treat high triglycerides is generally based on the level. For people with  borderline or high triglyceride levels, treatment includes weight loss and exercise. Drugs are recommended for people with very high triglyceride levels. Many people who need treatment for high triglyceride levels have metabolic syndrome. This syndrome is a collection of disorders that often include: insulin resistance, high blood pressure, blood clotting problems, high cholesterol and triglycerides. TESTING PROCEDURE FOR TRIGLYCERIDES  You should not eat 4 hours before getting your triglycerides measured. The normal range of triglycerides is between 10 and 250 milligrams per deciliter (mg/dl). Some people may have extreme levels (1000 or above), but your triglyceride level may be too high if it is above 150 mg/dl, depending on what other risk factors you have for heart disease.  People with high blood triglycerides may also have high blood cholesterol levels. If you have high blood cholesterol as well as high blood triglycerides, your risk for heart disease is probably greater than if you only had high triglycerides. High blood cholesterol is one of the main risk factors for heart disease. CHANGING YOUR DIET  Your weight can affect your blood triglyceride level. If you are more than 20% above your ideal body weight, you may be able to lower your blood triglycerides by losing weight. Eating less and exercising regularly is the best way to combat this. Fat provides more calories than any other food. The best way to lose weight is to eat less fat. Only 30% of your total calories should come from fat. Less than 7% of your diet should come from saturated fat. A diet low in fat and saturated fat is the same as a diet to decrease blood cholesterol. By  eating a diet lower in fat, you may lose weight, lower your blood cholesterol, and lower your blood triglyceride level.  Eating a diet low in fat, especially saturated fat, may also help you lower your blood triglyceride level. Ask your dietitian to help you figure  how much fat you can eat based on the number of calories your caregiver has prescribed for you.  Exercise, in addition to helping with weight loss may also help lower triglyceride levels.   Alcohol can increase blood triglycerides. You may need to stop drinking alcoholic beverages.  Too much carbohydrate in your diet may also increase your blood triglycerides. Some complex carbohydrates are necessary in your diet. These may include bread, rice, potatoes, other starchy vegetables and cereals.  Reduce "simple" carbohydrates. These may include pure sugars, candy, honey, and jelly without losing other nutrients. If you have the kind of high blood triglycerides that is affected by the amount of carbohydrates in your diet, you will need to eat less sugar and less high-sugar foods. Your caregiver can help you with this.  Adding 2-4 grams of fish oil (EPA+ DHA) may also help lower triglycerides. Speak with your caregiver before adding any supplements to your regimen. Following the Diet  Maintain your ideal weight. Your caregivers can help you with a diet. Generally, eating less food and getting more exercise will help you lose weight. Joining a weight control group may also help. Ask your caregivers for a good weight control group in your area.  Eat low-fat foods instead of high-fat foods. This can help you lose weight too.  These foods are lower in fat. Eat MORE of these:   Dried beans, peas, and lentils.  Egg whites.  Low-fat cottage cheese.  Fish.  Lean cuts of meat, such as round, sirloin, rump, and flank (cut extra fat off meat you fix).  Whole grain breads, cereals and pasta.  Skim and nonfat dry milk.  Low-fat yogurt.  Poultry without the skin.  Cheese made with skim or part-skim milk, such as mozzarella, parmesan, farmers', ricotta, or pot cheese. These are higher fat foods. Eat LESS of these:   Whole milk and foods made from whole milk, such as American, blue, cheddar, monterey  jack, and swiss cheese  High-fat meats, such as luncheon meats, sausages, knockwurst, bratwurst, hot dogs, ribs, corned beef, ground pork, and regular ground beef.  Fried foods. Limit saturated fats in your diet. Substituting unsaturated fat for saturated fat may decrease your blood triglyceride level. You will need to read package labels to know which products contain saturated fats.  These foods are high in saturated fat. Eat LESS of these:   Fried pork skins.  Whole milk.  Skin and fat from poultry.  Palm oil.  Butter.  Shortening.  Cream cheese.  Autumn Frye.  Margarines and baked goods made from listed oils.  Vegetable shortenings.  Chitterlings.  Fat from meats.  Coconut oil.  Palm kernel oil.  Lard.  Cream.  Sour cream.  Fatback.  Coffee whiteners and non-dairy creamers made with these oils.  Cheese made from whole milk. Use unsaturated fats (both polyunsaturated and monounsaturated) moderately. Remember, even though unsaturated fats are better than saturated fats; you still want a diet low in total fat.  These foods are high in unsaturated fat:   Canola oil.  Sunflower oil.  Mayonnaise.  Almonds.  Peanuts.  Pine nuts.  Margarines made with these oils.  Safflower oil.  Olive oil.  Avocados.  Cashews.  Peanut butter.  Sunflower seeds.  Soybean oil.  Peanut oil.  Olives.  Pecans.  Walnuts.  Pumpkin seeds. Avoid sugar and other high-sugar foods. This will decrease carbohydrates without decreasing other nutrients. Sugar in your food goes rapidly to your blood. When there is excess sugar in your blood, your liver may use it to make more triglycerides. Sugar also contains calories without other important nutrients.  Eat LESS of these:   Sugar, brown sugar, powdered sugar, jam, jelly, preserves, honey, syrup, molasses, pies, candy, cakes, cookies, frosting, pastries, colas, soft drinks, punches, fruit drinks, and regular  gelatin.  Avoid alcohol. Alcohol, even more than sugar, may increase blood triglycerides. In addition, alcohol is high in calories and low in nutrients. Ask for sparkling water, or a diet soft drink instead of an alcoholic beverage. Suggestions for planning and preparing meals   Bake, broil, grill or roast meats instead of frying.  Remove fat from meats and skin from poultry before cooking.  Add spices, herbs, lemon juice or vinegar to vegetables instead of salt, rich sauces or gravies.  Use a non-stick skillet without fat or use no-stick sprays.  Cool and refrigerate stews and broth. Then remove the hardened fat floating on the surface before serving.  Refrigerate meat drippings and skim off fat to make low-fat gravies.  Serve more fish.  Use less butter, margarine and other high-fat spreads on bread or vegetables.  Use skim or reconstituted non-fat dry milk for cooking.  Cook with low-fat cheeses.  Substitute low-fat yogurt or cottage cheese for all or part of the sour cream in recipes for sauces, dips or congealed salads.  Use half yogurt/half mayonnaise in salad recipes.  Substitute evaporated skim milk for cream. Evaporated skim milk or reconstituted non-fat dry milk can be whipped and substituted for whipped cream in certain recipes.  Choose fresh fruits for dessert instead of high-fat foods such as pies or cakes. Fruits are naturally low in fat. When Dining Out   Order low-fat appetizers such as fruit or vegetable juice, pasta with vegetables or tomato sauce.  Select clear, rather than cream soups.  Ask that dressings and gravies be served on the side. Then use less of them.  Order foods that are baked, broiled, poached, steamed, stir-fried, or roasted.  Ask for margarine instead of butter, and use only a small amount.  Drink sparkling water, unsweetened tea or coffee, or diet soft drinks instead of alcohol or other sweet beverages. QUESTIONS AND ANSWERS ABOUT  OTHER FATS IN THE BLOOD: SATURATED FAT, TRANS FAT, AND CHOLESTEROL What is trans fat? Trans fat is a type of fat that is formed when vegetable oil is hardened through a process called hydrogenation. This process helps makes foods more solid, gives them shape, and prolongs their shelf life. Trans fats are also called hydrogenated or partially hydrogenated oils.  What do saturated fat, trans fat, and cholesterol in foods have to do with heart disease? Saturated fat, trans fat, and cholesterol in the diet all raise the level of LDL "bad" cholesterol in the blood. The higher the LDL cholesterol, the greater the risk for coronary heart disease (CHD). Saturated fat and trans fat raise LDL similarly.  What foods contain saturated fat, trans fat, and cholesterol? High amounts of saturated fat are found in animal products, such as fatty cuts of meat, chicken skin, and full-fat dairy products like butter, whole milk, cream, and cheese, and in tropical vegetable oils such as palm, palm kernel, and coconut oil. Trans fat is found in  some of the same foods as saturated fat, such as vegetable shortening, some margarines (especially hard or stick margarine), crackers, cookies, baked goods, fried foods, salad dressings, and other processed foods made with partially hydrogenated vegetable oils. Small amounts of trans fat also occur naturally in some animal products, such as milk products, beef, and lamb. Foods high in cholesterol include liver, other organ meats, egg yolks, shrimp, and full-fat dairy products. How can I use the new food label to make heart-healthy food choices? Check the Nutrition Facts panel of the food label. Choose foods lower in saturated fat, trans fat, and cholesterol. For saturated fat and cholesterol, you can also use the Percent Daily Value (%DV): 5% DV or less is low, and 20% DV or more is high. (There is no %DV for trans fat.) Use the Nutrition Facts panel to choose foods low in saturated fat and  cholesterol, and if the trans fat is not listed, read the ingredients and limit products that list shortening or hydrogenated or partially hydrogenated vegetable oil, which tend to be high in trans fat. POINTS TO REMEMBER:   Discuss your risk for heart disease with your caregivers, and take steps to reduce risk factors.  Change your diet. Choose foods that are low in saturated fat, trans fat, and cholesterol.  Add exercise to your daily routine if it is not already being done. Participate in physical activity of moderate intensity, like brisk walking, for at least 30 minutes on most, and preferably all days of the week. No time? Break the 30 minutes into three, 10-minute segments during the day.  Stop smoking. If you do smoke, contact your caregiver to discuss ways in which they can help you quit.  Do not use street drugs.  Maintain a normal weight.  Maintain a healthy blood pressure.  Keep up with your blood work for checking the fats in your blood as directed by your caregiver. Document Released: 06/10/2004 Document Revised: 02/22/2012 Document Reviewed: 01/06/2009 [facility] Patient Information 2014 [place], [place]. Hypertension Hypertension is another name for high blood pressure. High blood pressure may mean that your heart needs to work harder to pump blood. Blood pressure consists of two numbers, which includes a higher number over a lower number (example: 110/72). HOME CARE   Make lifestyle changes as told by your doctor. This may include weight loss and exercise.  Take your blood pressure medicine every day.  Limit how much salt you use.  Stop smoking if you smoke.  Do not use drugs.  Talk to your doctor if you are using decongestants or birth control pills. These medicines might make blood pressure higher.  Females should not drink more than 1 alcoholic drink per day. Males should not drink more than 2 alcoholic drinks per day.  See your doctor as told. GET HELP RIGHT  AWAY IF:   You have a blood pressure reading with a top number of 180 or higher.  You get a very bad headache.  You get blurred or changing vision.  You feel confused.  You feel weak, numb, or faint.  You get chest or belly (abdominal) pain.  You throw up (vomit).  You cannot breathe very well. MAKE SURE YOU:   Understand these instructions.  Will watch your condition.  Will get help right away if you are not doing well or get worse. Document Released: 02/09/2008 Document Revised: 11/15/2011 Document Reviewed: 02/09/2008 [facility] Patient Information 2014 [place], [place].

## 2013-07-10 NOTE — Progress Notes (Addendum)
Patient ID: Autumn Frye, female   DOB: 1960-12-13, 52 y.o.   MRN: [id number] HPI Patient presents for 3 month follow up with hypertension, hyperlipidemia, prediabetes and vitamin D. Patient's blood pressure has been controlled at home. Patient denies chest pain, shortness of breath, dizziness. Patient's cholesterol is diet controlled and is on medicine and denies myalgias. The cholesterol last visit was T 207 TG 155 H 49 LDL 127  She has  been working on diet and exercise for prediabetes, denies changes in vision, polys, and paresthesias. Last A1C in office was 5.1. Patient is on Vitamin D supplement. Not exercising regularly due to RA pain but has increased H2O aerobics, which helps with  Pain, has F/U with Rheum in a couple of weeks to discuss pain in feet. Patient has tried to improve diet. Patient with chronic fatigue with RA. Concerned has recurrent thrush for over 1 week with out any recent antibiotics.  Current outpatient prescriptions:amitriptyline (ELAVIL) 25 MG tablet, Take 25 mg by mouth at bedtime. 1 po a.m. 3 po qhs, Disp: , Rfl: ;  atorvastatin (LIPITOR) 80 MG tablet, Take 80 mg by mouth daily at 6 PM. 1/2 MWF, Disp: , Rfl: ;  azithromycin (AZASITE) 1 % ophthalmic solution, 1 drop 2 (two) times daily., Disp: , Rfl:  baclofen (LIORESAL) 20 MG tablet, Take 10-20 mg by mouth 3 (three) times daily as needed (for back spasms). 1/2 tab qd, Disp: , Rfl: ;  Calcium Citrate-Vitamin D (CITRACAL + D PO), Take 1 tablet by mouth daily. , Disp: , Rfl: ;  Cholecalciferol (VITAMIN D3) 5000 UNITS TABS, Take 5,000 mg by mouth daily., Disp: , Rfl: ;  ezetimibe (ZETIA) 10 MG tablet, Take 10 mg by mouth daily., Disp: , Rfl:  FEMRING 0.1 MG/24HR RING, Place 1 each vaginally every 3 (three) months. , Disp: , Rfl: ;  folic acid (FOLVITE) 1 MG tablet, Take 2 mg by mouth daily., Disp: , Rfl: ;  furosemide (LASIX) 40 MG tablet, Take 40 mg by mouth daily., Disp: , Rfl: ;  Golimumab (SIMPONI [place]), Inject into the skin.  Injection montly, Disp: , Rfl: ;  levothyroxine (SYNTHROID, LEVOTHROID) 125 MCG tablet, Take 150 mcg by mouth daily. , Disp: , Rfl:  Magnesium Oxide 500 MG TABS, Take 500 mg by mouth daily., Disp: , Rfl: ;  methotrexate 1 G injection, Inject as directed once. 0.06 mg injection every Thursday, Disp: , Rfl: ;  Nutritional Supplements (CARDIO COMPLETE) CAPS, Take 1 capsule by mouth., Disp: , Rfl: ;  Polyethyl Glycol-Propyl Glycol (SYSTANE) 0.4-0.3 % SOLN, Apply 1 drop to eye 4 (four) times daily., Disp: , Rfl:  predniSONE (DELTASONE) 5 MG tablet, Take 10 mg by mouth daily. , Disp: , Rfl: ;  PROAIR HFA 108 (90 BASE) MCG/ACT inhaler, Inhale 2 puffs into the lungs every 6 (six) hours as needed for wheezing or shortness of breath. , Disp: , Rfl: ;  fluconazole (DIFLUCAN) 100 MG tablet, Take 1.5 tablets (150 mg total) by mouth once a week., Disp: 4 tablet, Rfl: 0 gabapentin (NEURONTIN) 300 MG capsule, Take 1 capsule (300 mg total) by mouth 3 (three) times daily., Disp: 90 capsule, Rfl: 3;  loratadine (CLARITIN) 10 MG tablet, Take 10 mg by mouth daily., Disp: , Rfl: ;  nystatin (MYCOSTATIN) 100000 UNIT/ML suspension, Take 5 mLs (500,000 Units total) by mouth 4 (four) times daily., Disp: 60 mL, Rfl: 0 oxyCODONE-acetaminophen (PERCOCET/ROXICET) 5-325 MG per tablet, Take 1-2 tablets by mouth every 4 (four) hours as needed for  pain., Disp: 80 tablet, Rfl: 0  Allergies  Allergen Reactions  . Hydrocodone Itching  . Morphine And Related Itching  . Compazine [Prochlorperazine Edisylate]   . Levaquin [Levofloxacin] Hives  . Lyrica [Pregabalin]     hallucinations  . Ciprofloxacin Hives and Swelling  . Penicillins Itching  . Sulfa Antibiotics Itching    ROS Constitutional: Denies fever, chills, weight loss/gain, headaches, insomnia, night sweats, and change in appetite. Eyes: Denies redness, blurred vision, diplopia, discharge, itchy, watery eyes.  ENT: Denies discharge, congestion, post nasal drip, epistaxis, sore  throat, earache, hearing loss, dental pain, Tinnitus, Vertigo, Sinus pain, snoring.  Cardio: Denies chest pain, palpitations, irregular heartbeat, syncope, dyspnea, diaphoresis, orthopnea, PND, claudication, edema Respiratory: denies cough, dyspnea, DOE, pleurisy, hoarseness, laryngitis, wheezing.  Gastrointestinal: Denies dysphagia, heartburn, reflux, water brash, pain, cramps, nausea, vomiting, bloating, diarrhea, constipation, hematemesis, melena, hematochezia, jaundice, hemorrhoids Genitourinary: Denies dysuria, frequency, urgency, nocturia, hesitancy, discharge, hematuria, flank pain Musculoskeletal: Denies arthralgia, myalgia, stiffness, Jt. Swelling, pain, limp, and strain/sprain. Skin: Denies puritis, rash, hives, warts, acne, eczema, changing in skin lesion Neuro: Weakness, tremor, incoordination, spasms, paresthesia, pain Psychiatric: Denies confusion, memory loss, sensory loss Endocrine: Denies change in weight, skin, hair change, nocturia, and paresthesia, Diabetic Polys, visual blurring, hyper /hypo glycemic episodes.  Heme/Lymph: Excessive bleeding, bruising, enlarged lymph nodes  SEE HPI for Positives. Past Medical History  Diagnosis Date  . Cancer     thyroid-1995  . Hiatal hernia   . GERD (gastroesophageal reflux disease)   . Arthritis     OA, RA  . Asthma   . Hyperlipidemia   . Hypertension   . Hypothyroidism    Family history- Reviewed and unchanged Social history- Reviewed and unchanged Physical Exam: Filed Vitals:   07/10/13 0913  Weight: 195 lb (88.451 kg)  BP:108/70       Temp: 97 Pulse:78 Resp:18  General Appearance: Well nourished, in no apparent distress. Eyes: PERRLA, EOMs, conjunctiva no swelling or erythema Sinuses: No Frontal/maxillary tenderness ENT/Mouth: Tongue with white coating.Ext aud canals clear, normal light reflex with TMs without erythema, bulging. Nares clear with no erythema, swelling, mucus on turbinates. No ulcers, cracking, on lips.  Good dentition. No erythema, swelling, or exudate on post pharynx. Tongue normal, non-obstructing. Tonsils not swollen or erythematous. Hearing normal.  Neck: Supple, thyroid normal. No bruits or JVD. Mild anterior right side fullness Respiratory: Respiratory effort normal, BS equal bilaterally without rales, rhonci, or stridor. Wheeze clears with cough. Cardio: Heart sounds normal, regular rate and rhythm without murmurs, rubs or gallops. Peripheral pulses brisk and equal bilaterally, without edema. No aortic or femoral bruits. Abdomen: Flat, soft, with nl bowel sounds. Nontender, no guarding, rebound, hernias, masses, or organomegaly.  Lymphatics: Non tender without lymphadenopathy.  Musculoskeletal: Full ROM all peripheral extremities, joint stability, 5/5 strength, and mild limp with right foot. Skin: Warm, dry without rashes, lesions, ecchymosis.  Neuro: Cranial nerves intact. Normal muscle tone, no cerebellar symptoms. Sensation intact.  Pysch: Awake and oriented X 3, normal affect, Insight and Judgment appropriate.   Assessement and Plan: Hypertension: Continue medication, monitor blood pressure at home. Continue mediterean diet. Cholesterol: Continue diet and exercise. Check cholesterol.  Pre-diabetes-Continue diet and exercise. Check A1C Vitamin D Def- check level and continue medications.  Chronic fatigue: check TSH, keep F/U at Rheum Thrush: Hygiene explained, Nystatin mouth wash AD, Diflucan 150 mg 1 a week, patient aware to hold cholesterol RX while on. F/U if no improvement. Patient has chronic left LE edema/ pain since lumbar surgery  advised needs to F/U with surgeon If neck fullness continues will need U/S vs CT evaluation

## 2013-07-12 ENCOUNTER — Telehealth: Payer: Self-pay | Admitting: *Deleted

## 2013-07-12 NOTE — Telephone Encounter (Signed)
RESULTS

## 2013-07-12 NOTE — Telephone Encounter (Signed)
Spoke with pt about lab results from 07/10/2013.  Informed her per Loree Fee, PA-C to add 250mg  Magnesium to the 500mg  Magnesium pt is currently taking.  Also advised pt to drink more water and continue to work on diet and exercise AD per Loree Fee, PA-C to help lower Lipids.  Advised pt to continue to monitor neck for any continued symptoms of fullness.  Pt to continue Rx for yeast in mouth and call if neck fullness continues for ultrasound to be ordered.

## 2013-08-15 ENCOUNTER — Other Ambulatory Visit: Payer: Self-pay | Admitting: Physician Assistant

## 2013-09-12 ENCOUNTER — Encounter: Payer: Self-pay | Admitting: Physician Assistant

## 2013-09-12 ENCOUNTER — Ambulatory Visit (INDEPENDENT_AMBULATORY_CARE_PROVIDER_SITE_OTHER): Payer: Medicare Other | Admitting: Physician Assistant

## 2013-09-12 ENCOUNTER — Other Ambulatory Visit: Payer: Self-pay | Admitting: Emergency Medicine

## 2013-09-12 VITALS — BP 102/68 | HR 80 | Temp 97.9°F | Resp 16 | Wt 190.0 lb

## 2013-09-12 DIAGNOSIS — J209 Acute bronchitis, unspecified: Secondary | ICD-10-CM

## 2013-09-12 DIAGNOSIS — J159 Unspecified bacterial pneumonia: Secondary | ICD-10-CM

## 2013-09-12 LAB — POC INFLUENZA A&B (BINAX/QUICKVUE)

## 2013-09-12 MED ORDER — IPRATROPIUM-ALBUTEROL 0.5-2.5 (3) MG/3ML IN SOLN
3.0000 mL | Freq: Once | RESPIRATORY_TRACT | Status: AC
  Administered 2013-09-12: 3 mL via RESPIRATORY_TRACT

## 2013-09-12 MED ORDER — BENZONATATE 100 MG PO CAPS: 100.0000 mg | ORAL_CAPSULE | Freq: Three times a day (TID) | ORAL | Status: DC | PRN

## 2013-09-12 MED ORDER — ALBUTEROL SULFATE (2.5 MG/3ML) 0.083% IN NEBU: 2.5000 mg | INHALATION_SOLUTION | Freq: Four times a day (QID) | RESPIRATORY_TRACT | Status: DC | PRN

## 2013-09-12 MED ORDER — DOXYCYCLINE HYCLATE 100 MG PO TABS
100.0000 mg | ORAL_TABLET | Freq: Two times a day (BID) | ORAL | Status: DC
Start: 2013-09-12 — End: 2014-03-26

## 2013-09-12 NOTE — Progress Notes (Signed)
   Subjective:    Patient ID: Autumn Frye, female    DOB: Aug 05, 1961, 53 y.o.   MRN: [id number]  Cough This is a new problem. Episode onset: 2 weeks. The problem has been gradually worsening. The problem occurs constantly. Cough characteristics: did have green sputum but now non productive. Associated symptoms include chills, ear congestion, ear pain, a fever, headaches, myalgias, nasal congestion, shortness of breath and wheezing. Pertinent negatives include no chest pain, heartburn, hemoptysis, postnasal drip, rash, rhinorrhea, sore throat, sweats or weight loss. She has tried a beta-agonist inhaler and oral steroids (Month of prednisone from rhem. , zpak) for the symptoms. The treatment provided no relief. Her past medical history is significant for asthma.    Review of Systems  Constitutional: Positive for fever, chills and fatigue. Negative for weight loss and diaphoresis.  HENT: Positive for ear pain and sinus pressure. Negative for postnasal drip, rhinorrhea, sore throat, trouble swallowing and voice change.   Eyes: Negative.   Respiratory: Positive for cough, shortness of breath and wheezing. Negative for hemoptysis.   Cardiovascular: Negative.  Negative for chest pain.  Gastrointestinal: Negative.  Negative for heartburn.  Musculoskeletal: Positive for myalgias.  Skin: Negative for rash.  Neurological: Positive for headaches.       Objective:   Physical Exam  Constitutional: She is oriented to person, place, and time. She appears well-developed and well-nourished.  HENT:  Head: Normocephalic and atraumatic.  Right Ear: External ear normal.  Left Ear: External ear normal.  Nose: Right sinus exhibits maxillary sinus tenderness. Left sinus exhibits maxillary sinus tenderness.  Mouth/Throat: Oropharynx is clear and moist.  Eyes: Conjunctivae are normal. Pupils are equal, round, and reactive to light.  Neck: Normal range of motion. Neck supple.  Cardiovascular: Normal rate and  regular rhythm.   Pulmonary/Chest: Effort normal. No respiratory distress. She has wheezes. She has no rales. She exhibits no tenderness.  Abdominal: Soft. Bowel sounds are normal.  Lymphadenopathy:    She has no cervical adenopathy.  Neurological: She is alert and oriented to person, place, and time.  Skin: Skin is warm and dry.      Assessment & Plan:  Acute bronchitis - Plan: benzonatate (TESSALON) 100 MG capsule, doxycycline (VIBRA-TABS) 100 MG tablet, ipratropium-albuterol (DUONEB) 0.5-2.5 (3) MG/3ML nebulizer solution 3 mL, albuterol (PROVENTIL) (2.5 MG/3ML) 0.083% nebulizer solution, POC Influenza A&B- negative  If she is not better we will get a CXR

## 2013-09-12 NOTE — Patient Instructions (Signed)

## 2013-09-24 ENCOUNTER — Ambulatory Visit (INDEPENDENT_AMBULATORY_CARE_PROVIDER_SITE_OTHER): Payer: Medicare Other | Admitting: Internal Medicine

## 2013-09-24 ENCOUNTER — Encounter: Payer: Self-pay | Admitting: Internal Medicine

## 2013-09-24 VITALS — BP 130/84 | HR 100 | Temp 97.9°F | Resp 18 | Ht 63.5 in | Wt 191.0 lb

## 2013-09-24 DIAGNOSIS — Z0000 Encounter for general adult medical examination without abnormal findings: Secondary | ICD-10-CM

## 2013-09-24 DIAGNOSIS — R7309 Other abnormal glucose: Secondary | ICD-10-CM

## 2013-09-24 DIAGNOSIS — I10 Essential (primary) hypertension: Secondary | ICD-10-CM

## 2013-09-24 DIAGNOSIS — Z79899 Other long term (current) drug therapy: Secondary | ICD-10-CM

## 2013-09-24 DIAGNOSIS — Z1212 Encounter for screening for malignant neoplasm of rectum: Secondary | ICD-10-CM

## 2013-09-24 DIAGNOSIS — M069 Rheumatoid arthritis, unspecified: Secondary | ICD-10-CM

## 2013-09-24 DIAGNOSIS — E559 Vitamin D deficiency, unspecified: Secondary | ICD-10-CM | POA: Insufficient documentation

## 2013-09-24 DIAGNOSIS — E782 Mixed hyperlipidemia: Secondary | ICD-10-CM

## 2013-09-24 LAB — HEPATIC FUNCTION PANEL
ALT: 26 U/L (ref 0–35)
AST: 22 U/L (ref 0–37)
Albumin: 4 g/dL (ref 3.5–5.2)
Alkaline Phosphatase: 60 U/L (ref 39–117)
TOTAL PROTEIN: 6.5 g/dL (ref 6.0–8.3)
Total Bilirubin: 0.3 mg/dL (ref 0.3–1.2)

## 2013-09-24 LAB — CBC WITH DIFFERENTIAL/PLATELET
BASOS ABS: 0 10*3/uL (ref 0.0–0.1)
BASOS PCT: 0 % (ref 0–1)
EOS ABS: 0 10*3/uL (ref 0.0–0.7)
Eosinophils Relative: 0 % (ref 0–5)
HCT: 39.9 % (ref 36.0–46.0)
Hemoglobin: 13.7 g/dL (ref 12.0–15.0)
Lymphocytes Relative: 63 % — ABNORMAL HIGH (ref 12–46)
Lymphs Abs: 5.3 10*3/uL — ABNORMAL HIGH (ref 0.7–4.0)
MCH: 30.6 pg (ref 26.0–34.0)
MCHC: 34.3 g/dL (ref 30.0–36.0)
MCV: 89.1 fL (ref 78.0–100.0)
Monocytes Absolute: 0.6 10*3/uL (ref 0.1–1.0)
Monocytes Relative: 7 % (ref 3–12)
NEUTROS ABS: 2.5 10*3/uL (ref 1.7–7.7)
NEUTROS PCT: 30 % — AB (ref 43–77)
Platelets: 383 10*3/uL (ref 150–400)
RBC: 4.48 MIL/uL (ref 3.87–5.11)
RDW: 13.8 % (ref 11.5–15.5)
WBC: 8.4 10*3/uL (ref 4.0–10.5)

## 2013-09-24 LAB — HEMOGLOBIN A1C
Hgb A1c MFr Bld: 5.6 % (ref <5.7)
MEAN PLASMA GLUCOSE: 114 mg/dL (ref <117)

## 2013-09-24 LAB — BASIC METABOLIC PANEL WITH GFR
BUN: 12 mg/dL (ref 6–23)
CALCIUM: 9.1 mg/dL (ref 8.4–10.5)
CO2: 28 mEq/L (ref 19–32)
Chloride: 102 mEq/L (ref 96–112)
Creat: 0.55 mg/dL (ref 0.50–1.10)
Glucose, Bld: 85 mg/dL (ref 70–99)
POTASSIUM: 4 meq/L (ref 3.5–5.3)
SODIUM: 141 meq/L (ref 135–145)

## 2013-09-24 LAB — LIPID PANEL
CHOLESTEROL: 201 mg/dL — AB (ref 0–200)
HDL: 52 mg/dL (ref >39)
LDL CALC: 115 mg/dL — AB (ref 0–99)
TRIGLYCERIDES: 168 mg/dL — AB (ref <150)
Total CHOL/HDL Ratio: 3.9 Ratio
VLDL: 34 mg/dL (ref 0–40)

## 2013-09-24 LAB — MAGNESIUM: Magnesium: 2 mg/dL (ref 1.5–2.5)

## 2013-09-24 MED ORDER — LEVOFLOXACIN 500 MG PO TABS: 500.0000 mg | ORAL_TABLET | Freq: Every day | ORAL | Status: AC

## 2013-09-24 MED ORDER — PREDNISONE 20 MG PO TABS: ORAL_TABLET | ORAL | Status: DC

## 2013-09-24 NOTE — Progress Notes (Signed)
Patient ID: Autumn Frye, female   DOB: 1961/07/22, 53 y.o.   MRN: [id number]   This very nice 53 y.o. MWF presents for 3 month follow up with Hypertension, Hyperlipidemia, Pre-Diabetes a, Rheumatoid Arthritis and Vitamin D Deficiency.    HTN predates since 2000. BP has been controlled at home. Today's BP: 130/84 mmHg . Patient denies any cardiac type chest pain, palpitations, dyspnea/orthopnea/PND, dizziness, claudication, or dependent edema.   Hyperlipidemia is controlled with diet & meds. Last Cholesterol was 205, Triglycerides were 128, HDL 60 and LDL 119 in Nov 2014. Patient denies myalgias or other med SE's.    Also, the patient has history of PreDiabetes with A1c 5.9% in Dec 2010 and with last A1c of 5.4% in Nov 2014. Patient denies any symptoms of reactive hypoglycemia, diabetic polys, paresthesias or visual blurring.   Patient's Rheumatoid Arthritis Dx predates to 2009 & she is followed by Dr Ouida Sills. Recently he D/C'd [place] in anticipation of switching to [place]. Patient c/o's of pains of her ankles, fee tand toes as well as her wrists,  Hands and fingers.   Further, Patient has history of Vitamin D Deficiency of 27 in 2009 with last vitamin D of 62 in Nov 2014. Patient supplements vitamin D without any suspected side-effects.    Medication List   amitriptyline 25 MG tablet  Commonly known as:  ELAVIL  Take 25 mg by mouth at bedtime. 1 po a.m. 3 po qhs     atorvastatin 80 MG tablet  Commonly known as:  LIPITOR  Take 80 mg by mouth daily at 6 PM. 1/2 MWF     AZASITE 1 % ophthalmic solution  Generic drug:  azithromycin  1 drop 2 (two) times daily.     baclofen 20 MG tablet  Commonly known as:  LIORESAL  TAKE 1/2 TO 1 TABLET BY MOUTH THREE TIMES DAILY AS NEEDED FOR BACK SPASM     benzonatate 100 MG capsule  Commonly known as:  TESSALON  Take 1 capsule (100 mg total) by mouth 3 (three) times daily as needed for cough.     CARDIO COMPLETE Caps  Take 1 capsule by mouth.      CITRACAL + D PO  Take 1 tablet by mouth daily.     doxycycline 100 MG tablet  Commonly known as:  VIBRA-TABS  Take 1 tablet (100 mg total) by mouth 2 (two) times daily.     estradiol 0.05 mg/24hr patch  Commonly known as:  CLIMARA - Dosed in mg/24 hr     ezetimibe 10 MG tablet  Commonly known as:  ZETIA  Take 10 mg by mouth daily.     FEMRING 0.1 MG/24HR Ring  Generic drug:  Estradiol Acetate     folic acid 1 MG tablet  Commonly known as:  FOLVITE  Take 2 mg by mouth daily.     furosemide 40 MG tablet  Commonly known as:  LASIX  Take 40 mg by mouth daily.     levofloxacin 500 MG tablet  Commonly known as:  LEVAQUIN  Take 1 tablet (500 mg total) by mouth daily. For infection     levothyroxine 125 MCG tablet  Commonly known as:  SYNTHROID, LEVOTHROID  Take 150 mcg by mouth daily.     Magnesium Oxide 500 MG Tabs  Take 500 mg by mouth daily.     methotrexate 1 G injection  Commonly known as:  50 mg/ml  Inject as directed once. 0.06 mg injection every Thursday.  Dose reduced to 0.03 mg , but not taking currently due to congestion     nystatin 100000 UNIT/ML suspension  Commonly known as:  MYCOSTATIN  Take 5 mLs (500,000 Units total) by mouth 4 (four) times daily.     omeprazole 40 MG capsule  Commonly known as:  PRILOSEC     oxyCODONE-acetaminophen 5-325 MG per tablet  Commonly known as:  PERCOCET/ROXICET  Take 1-2 tablets by mouth every 4 (four) hours as needed for pain.     predniSONE 5 MG tablet  Commonly known as:  DELTASONE  Take 10 mg by mouth daily.     predniSONE 20 MG tablet  Commonly known as:  DELTASONE  1 tab 3 x day for 5 days, then 1 tab 2 x day for 5 days, then 1 tab 1 x day for 5 days     PRESCRIPTION MEDICATION  Estradiol patch     PREVIDENT 5000 SENSITIVE 1.1-5 % Pste  Generic drug:  Sod Fluoride-Potassium Nitrate     PROAIR HFA 108 (90 BASE) MCG/ACT inhaler  Generic drug:  albuterol  Inhale 2 puffs into the lungs every 6 (six)  hours as needed for wheezing or shortness of breath.     albuterol (2.5 MG/3ML) 0.083% nebulizer solution  Commonly known as:  PROVENTIL  Take 3 mLs (2.5 mg total) by nebulization every 6 (six) hours as needed for wheezing or shortness of breath.     SYSTANE 0.4-0.3 % Soln  Generic drug:  Polyethyl Glycol-Propyl Glycol  Apply 1 drop to eye 4 (four) times daily.     Vitamin D3 5000 UNITS Tabs  Take 5,000 mg by mouth daily.         Allergies  Allergen Reactions  . Hydrocodone Itching  . Morphine And Related Itching  . Compazine [Prochlorperazine Edisylate]   . Levaquin [Levofloxacin] Hives  . Lyrica [Pregabalin]     hallucinations  . Ciprofloxacin Hives and Swelling  . Penicillins Itching  . Sulfa Antibiotics Itching    PMHx:   Past Medical History  Diagnosis Date  . Cancer     thyroid-1995  . Hiatal hernia   . GERD (gastroesophageal reflux disease)   . Arthritis     OA, RA  . Asthma   . Hyperlipidemia   . Hypertension   . Hypothyroidism     FHx:    Reviewed / unchanged  SHx:    Reviewed / unchanged  Systems Review: Constitutional: Denies fever, chills, wt changes, headaches, insomnia, fatigue, night sweats, change in appetite. Eyes: Denies redness, blurred vision, diplopia, discharge, itchy, watery eyes.  ENT: Denies discharge, congestion, post nasal drip, epistaxis, sore throat, earache, hearing loss, dental pain, tinnitus, vertigo, sinus pain, snoring.  CV: Denies chest pain, palpitations, irregular heartbeat, syncope, dyspnea, diaphoresis, orthopnea, PND, claudication, edema. Respiratory: denies cough, dyspnea, DOE, pleurisy, hoarseness, laryngitis, wheezing.  Gastrointestinal: Denies dysphagia, odynophagia, heartburn, reflux, water brash, abdominal pain or cramps, nausea, vomiting, bloating, diarrhea, constipation, hematemesis, melena, hematochezia,  or hemorrhoids. Genitourinary: Denies dysuria, frequency, urgency, nocturia, hesitancy, discharge, hematuria,  flank pain. Musculoskeletal: Denies arthralgias, myalgias, stiffness, jt. swelling, pain, limp, strain/sprain.  Skin: Denies pruritus, rash, hives, warts, acne, eczema, change in skin lesion(s). Neuro: No weakness, tremor, incoordination, spasms, paresthesia, or pain. Psychiatric: Denies confusion, memory loss, or sensory loss. Endo: Denies change in weight, skin, hair change.  Heme/Lymph: No excessive bleeding, bruising, orenlarged lymph nodes.  BP: 130/84  Pulse: 100  Temp: 97.9 F (36.6 C)  Resp: 18  Estimated body mass index is 33.3 kg/(m^2) as calculated from the following:   Height as of this encounter: 5' 3.5" (1.613 m).   Weight as of this encounter: 191 lb (86.637 kg).  On Exam: Appears well nourished - in no distress. Eyes: PERRLA, EOMs, conjunctiva no swelling or erythema. Sinuses: No frontal/maxillary tenderness ENT/Mouth: EAC's clear, TM's nl w/o erythema, bulging. Nares clear w/o erythema, swelling, exudates. Oropharynx clear without erythema or exudates. Oral hygiene is good. Tongue normal, non obstructing. Hearing intact.  Neck: Supple. Thyroid nl. Car 2+/2+ without bruits, nodes or JVD. Chest: Respirations nl with BS clear & equal w/o rales, rhonchi, wheezing or stridor.  Cor: Heart sounds normal w/ regular rate and rhythm without sig. murmurs, gallops, clicks, or rubs. Peripheral pulses normal and equal  without edema.  Abdomen: Soft & bowel sounds normal. Non-tender w/o guarding, rebound, hernias, masses, or organomegaly.  Lymphatics: Unremarkable.  Musculoskeletal: Full ROM all peripheral extremities, joint stability, 5/5 strength, and normal gait. ? Early rheumatoid deformities of the toes.  Skin: Warm, dry without exposed rashes, lesions, ecchymosis apparent.  Neuro: Cranial nerves intact, reflexes equal bilaterally. Sensory-motor testing grossly intact. Tendon reflexes grossly intact.  Pysch: Alert & oriented x 3. Insight and judgement nl & appropriate. No  ideations.  Assessment and Plan:  1. Hypertension - Continue monitor blood pressure at home. Continue diet/meds same.  2. Hyperlipidemia - Continue diet/meds, exercise,& lifestyle modifications. Continue monitor periodic cholesterol/liver & renal functions   3. Pre-diabetes - Continue diet, exercise, lifestyle modifications. Monitor appropriate labs.  4. Vitamin D Deficiency - Continue supplementation.  5. Rheumatoid Arthritis  6. Hypothyroidism Recommended regular exercise, BP monitoring, weight control, and discussed med and SE's. Recommended labs to assess and monitor clinical status. Further disposition pending results of labs.

## 2013-09-24 NOTE — Patient Instructions (Signed)

## 2013-09-25 LAB — URINALYSIS, MICROSCOPIC ONLY
Bacteria, UA: NONE SEEN
CRYSTALS: NONE SEEN
Casts: NONE SEEN

## 2013-09-25 LAB — VITAMIN D 25 HYDROXY (VIT D DEFICIENCY, FRACTURES): VIT D 25 HYDROXY: 72 ng/mL (ref 30–89)

## 2013-09-25 LAB — TSH: TSH: 0.038 u[IU]/mL — ABNORMAL LOW (ref 0.350–4.500)

## 2013-09-25 LAB — MICROALBUMIN / CREATININE URINE RATIO
CREATININE, URINE: 66.8 mg/dL
MICROALB/CREAT RATIO: 27.1 mg/g (ref 0.0–30.0)
Microalb, Ur: 1.81 mg/dL (ref 0.00–1.89)

## 2013-09-25 LAB — INSULIN, FASTING: INSULIN FASTING, SERUM: 6 u[IU]/mL (ref 3–28)

## 2013-10-17 ENCOUNTER — Ambulatory Visit (HOSPITAL_COMMUNITY)
Admission: RE | Admit: 2013-10-17 | Discharge: 2013-10-17 | Disposition: A | Discharge status: Home / Self Care | Payer: Medicare Other | Source: Ambulatory Visit | Attending: Physician Assistant | Admitting: Physician Assistant

## 2013-10-17 ENCOUNTER — Ambulatory Visit (INDEPENDENT_AMBULATORY_CARE_PROVIDER_SITE_OTHER): Payer: Medicare Other | Admitting: Physician Assistant

## 2013-10-17 ENCOUNTER — Other Ambulatory Visit: Payer: Self-pay | Admitting: Internal Medicine

## 2013-10-17 ENCOUNTER — Encounter: Payer: Self-pay | Admitting: Physician Assistant

## 2013-10-17 DIAGNOSIS — B3781 Candidal esophagitis: Secondary | ICD-10-CM

## 2013-10-17 DIAGNOSIS — B370 Candidal stomatitis: Secondary | ICD-10-CM

## 2013-10-17 DIAGNOSIS — R5381 Other malaise: Secondary | ICD-10-CM

## 2013-10-17 DIAGNOSIS — R05 Cough: Secondary | ICD-10-CM

## 2013-10-17 DIAGNOSIS — R059 Cough, unspecified: Secondary | ICD-10-CM

## 2013-10-17 DIAGNOSIS — K219 Gastro-esophageal reflux disease without esophagitis: Secondary | ICD-10-CM

## 2013-10-17 DIAGNOSIS — R5383 Other fatigue: Secondary | ICD-10-CM

## 2013-10-17 DIAGNOSIS — Z1231 Encounter for screening mammogram for malignant neoplasm of breast: Secondary | ICD-10-CM

## 2013-10-17 LAB — BASIC METABOLIC PANEL WITH GFR
BUN: 13 mg/dL (ref 6–23)
CALCIUM: 9.7 mg/dL (ref 8.4–10.5)
CHLORIDE: 98 meq/L (ref 96–112)
CO2: 34 meq/L — AB (ref 19–32)
CREATININE: 0.77 mg/dL (ref 0.50–1.10)
GFR, Est African American: >89 mL/min
GFR, Est Non African American: 89 mL/min
Glucose, Bld: 96 mg/dL (ref 70–99)
Potassium: 4.2 mEq/L (ref 3.5–5.3)
Sodium: 138 mEq/L (ref 135–145)

## 2013-10-17 LAB — CBC WITH DIFFERENTIAL/PLATELET
Basophils Absolute: 0 10*3/uL (ref 0.0–0.1)
Basophils Relative: 0 % (ref 0–1)
EOS PCT: 0 % (ref 0–5)
Eosinophils Absolute: 0.1 10*3/uL (ref 0.0–0.7)
HEMATOCRIT: 42.9 % (ref 36.0–46.0)
Hemoglobin: 14.9 g/dL (ref 12.0–15.0)
LYMPHS ABS: 2.7 10*3/uL (ref 0.7–4.0)
Lymphocytes Relative: 22 % (ref 12–46)
MCH: 31.8 pg (ref 26.0–34.0)
MCHC: 34.7 g/dL (ref 30.0–36.0)
MCV: 91.5 fL (ref 78.0–100.0)
MONO ABS: 0.7 10*3/uL (ref 0.1–1.0)
Monocytes Relative: 6 % (ref 3–12)
Neutro Abs: 8.4 10*3/uL — ABNORMAL HIGH (ref 1.7–7.7)
Neutrophils Relative %: 72 % (ref 43–77)
Platelets: 336 10*3/uL (ref 150–400)
RBC: 4.69 MIL/uL (ref 3.87–5.11)
RDW: 14.4 % (ref 11.5–15.5)
WBC: 11.9 10*3/uL — ABNORMAL HIGH (ref 4.0–10.5)

## 2013-10-17 LAB — HEPATIC FUNCTION PANEL
ALBUMIN: 3.9 g/dL (ref 3.5–5.2)
ALK PHOS: 60 U/L (ref 39–117)
ALT: 31 U/L (ref 0–35)
AST: 25 U/L (ref 0–37)
Bilirubin, Direct: 0.1 mg/dL (ref 0.0–0.3)
Indirect Bilirubin: 0.5 mg/dL (ref 0.2–1.2)
TOTAL PROTEIN: 6.7 g/dL (ref 6.0–8.3)
Total Bilirubin: 0.6 mg/dL (ref 0.2–1.2)

## 2013-10-17 MED ORDER — PANTOPRAZOLE SODIUM 40 MG PO TBEC: 40.0000 mg | DELAYED_RELEASE_TABLET | Freq: Every day | ORAL | Status: DC

## 2013-10-17 MED ORDER — NYSTATIN 100000 UNIT/ML MT SUSP: 5.0000 mL | Freq: Four times a day (QID) | OROMUCOSAL | Status: DC

## 2013-10-17 MED ORDER — FLUCONAZOLE 100 MG PO TABS: 100.0000 mg | ORAL_TABLET | Freq: Two times a day (BID) | ORAL | Status: AC

## 2013-10-17 NOTE — Patient Instructions (Signed)
Cough, Adult  A cough is a reflex that helps clear your throat and airways. It can help heal the body or may be a reaction to an irritated airway. A cough may only last 2 or 3 weeks (acute) or may last more than 8 weeks (chronic).  CAUSES Acute cough:  Viral or bacterial infections. Chronic cough:  Infections.  Allergies.  Asthma.  Post-nasal drip.  Smoking.  Heartburn or acid reflux.  Some medicines.  Chronic lung problems (COPD).  Cancer. SYMPTOMS   Cough.  Fever.  Chest pain.  Increased breathing rate.  High-pitched whistling sound when breathing (wheezing).  Colored mucus that you cough up (sputum). TREATMENT   A bacterial cough may be treated with antibiotic medicine.  A viral cough must run its course and will not respond to antibiotics.  Your caregiver may recommend other treatments if you have a chronic cough. HOME CARE INSTRUCTIONS   Only take over-the-counter or prescription medicines for pain, discomfort, or fever as directed by your caregiver. Use cough suppressants only as directed by your caregiver.  Use a cold steam vaporizer or humidifier in your bedroom or home to help loosen secretions.  Sleep in a semi-upright position if your cough is worse at night.  Rest as needed.  Stop smoking if you smoke. SEEK IMMEDIATE MEDICAL CARE IF:   You have pus in your sputum.  Your cough starts to worsen.  You cannot control your cough with suppressants and are losing sleep.  You begin coughing up blood.  You have difficulty breathing.  You develop pain which is getting worse or is uncontrolled with medicine.  You have a fever. MAKE SURE YOU:   Understand these instructions.  Will watch your condition.  Will get help right away if you are not doing well or get worse. Document Released: 02/19/2011 Document Revised: 11/15/2011 Document Reviewed: 02/19/2011 [facility] Patient Information 2014 [place]. Thrush, Adult  Autumn Frye, also  called oral candidiasis, is a fungal infection that develops in the mouth and throat and on the tongue. It causes white patches to form on the mouth and tongue. Autumn Frye is most common in older adults, but it can occur at any age.  Many cases of thrush are mild, but this infection can also be more serious. Autumn Frye can be a recurring problem for people who have chronic illnesses or who take medicines that limit the body's ability to fight infection. Because these people have difficulty fighting infections, the fungus that causes thrush can spread throughout the body. This can cause life-threatening blood or organ infections. CAUSES  Autumn Frye is usually caused by a yeast called Candida albicans. This fungus is normally present in small amounts in the mouth and on other mucous membranes. It usually causes no harm. However, when conditions are present that allow the fungus to grow uncontrolled, it invades surrounding tissues and becomes an infection. Less often, other Candida species can also lead to thrush.  RISK FACTORS Autumn Frye is more likely to develop in the following people:  People with an impaired ability to fight infection (weakened immune system).   Older adults.   People with HIV.   People with diabetes.   People with dry mouth (xerostomia).   Pregnant women.   People with poor dental care, especially those who have false teeth.   People who use antibiotic medicines.  SIGNS AND SYMPTOMS  Autumn Frye can be a mild infection that causes no symptoms. If symptoms develop, they may include:   A burning feeling in the mouth  and throat. This can occur at the start of a thrush infection.   White patches that adhere to the mouth and tongue. The tissue around the patches may be red, raw, and painful. If rubbed (during tooth brushing, for example), the patches and the tissue of the mouth may bleed easily.   A bad taste in the mouth or difficulty tasting foods.   Cottony feeling in the  mouth.   Pain during eating and swallowing. DIAGNOSIS  Your health care provider can usually diagnose thrush by looking in your mouth and asking you questions about your health.  TREATMENT  Medicines that help prevent the growth of fungi (antifungals) are the standard treatment for thrush. These medicines are either applied directly to the affected area (topical) or swallowed (oral). The treatment will depend on the severity of the condition.  Mild Thrush Mild cases of thrush may clear up with the use of an antifungal mouth rinse or lozenges. Treatment usually lasts about 14 days.  Moderate to Severe Thrush  More severe thrush infections that have spread to the esophagus are treated with an oral antifungal medicine. A topical antifungal medicine may also be used.   For some severe infections, a treatment period longer than 14 days may be needed.   Oral antifungal medicines are almost never used during pregnancy because the fetus may be harmed. However, if a pregnant woman has a rare, severe thrush infection that has spread to her blood, oral antifungal medicines may be used. In this case, the risk of harm to the mother and fetus from the severe thrush infection may be greater than the risk posed by the use of antifungal medicines.  Persistent or Recurrent Thrush For cases of thrush that do not go away or keep coming back, treatment may involve the following:   Treatment may be needed twice as long as the symptoms last.   Treatment will include both oral and topical antifungal medicines.   People with weakened immune systems can take an antifungal medicine on a continuous basis to prevent thrush infections.  It is important to treat conditions that make you more likely to get thrush, such as diabetes or HIV.  HOME CARE INSTRUCTIONS   Only take over-the-counter or prescription medicine as directed by your health care provider. Talk to your health care provider about an  over-the-counter medicine called gentian violet, which kills bacteria and fungi.   Eat plain, unflavored yogurt as directed by your health care provider. Check the label to make sure the yogurt contains live cultures. This yogurt can help healthy bacteria grow in the mouth that can stop the growth of the fungus that causes thrush.   Try these measures to help reduce the discomfort of thrush:   Drink cold liquids such as water or iced tea.   Try flavored ice treats or frozen juices.   Eat foods that are easy to swallow, such as gelatin, ice cream, or custard.   If the patches in your mouth are painful, try drinking from a straw.   Rinse your mouth several times a day with a warm saltwater rinse. You can make the saltwater mixture with 1 tsp (6 g) of salt in 8 fl oz (0.2 L) of warm water.   If you wear dentures, remove the dentures before going to bed, brush them vigorously, and soak them in a cleaning solution as directed by your health care provider.   Women who are breastfeeding should clean their nipples with an antifungal medicine as  directed by their health care provider. Dry the nipples after breastfeeding. Applying lanolin-containing body lotion may help relieve nipple soreness.  SEEK MEDICAL CARE IF:  Your symptoms are getting worse or are not improving within 7 days of starting treatment.   You have symptoms of spreading infection, such as white patches on the skin outside of the mouth.   You are nursing and you have redness, burning, or pain in the nipples that is not relieved with treatment.  MAKE SURE YOU:  Understand these instructions.  Will watch your condition.  Will get help right away if you are not doing well or get worse. Document Released: 05/18/2004 Document Revised: 06/13/2013 Document Reviewed: 03/26/2013 [facility] Patient Information 2014 [place].  We are giving you chantix for smoking cessation. You can do it! And we are here to  help! You may have heard some scary side effects about chantix, the three most common I hear about are nausea, crazy dreams and depression.  However, I like for my patients to try to stay on 1/2 a tablet twice a day rather than one tablet twice a day as normally prescribed. This helps decrease the chances of side effects and helps save money by making a one month prescription last two months  Please start the prescription this way:  Start 1/2 tablet by mouth once daily after food with a full glass of water for 3 days Then do 1/2 tablet by mouth twice daily for 4 days.  At this point we have several options: 1) continue on 1/2 tablet twice a day- which I encourage you to do. You can stay on this dose the rest of the time on the medication or if you still feel the need to smoke you can do one of the two options below. 2) do one tablet in the morning and 1/2 in the evening which helps decrease dreams. 3) do one tablet twice a day.   What if I miss a dose? If you miss a dose, take it as soon as you can. If it is almost time for your next dose, take only that dose. Do not take double or extra doses.  What should I watch for while using this medicine? Visit your doctor or health care professional for regular check ups. Ask for ongoing advice and encouragement from your doctor or healthcare professional, friends, and family to help you quit. If you smoke while on this medication, quit again  Your mouth may get dry. Chewing sugarless gum or hard candy, and drinking plenty of water may help. Contact your doctor if the problem does not go away or is severe.  You may get drowsy or dizzy. Do not drive, use machinery, or do anything that needs mental alertness until you know how this medicine affects you. Do not stand or sit up quickly, especially if you are an older patient.   The use of this medicine may increase the chance of suicidal thoughts or actions. Pay special attention to how you are responding  while on this medicine. Any worsening of mood, or thoughts of suicide or dying should be reported to your health care professional right away.  ADVANTAGES OF QUITTING SMOKING  Within 20 minutes, blood pressure decreases. Your pulse is at normal level.  After 8 hours, carbon monoxide levels in the blood return to normal. Your oxygen level increases.  After 24 hours, the chance of having a heart attack starts to decrease. Your breath, hair, and body stop smelling like smoke.  After 48 hours, damaged nerve endings begin to recover. Your sense of taste and smell improve.  After 72 hours, the body is virtually free of nicotine. Your bronchial tubes relax and breathing becomes easier.  After 2 to 12 weeks, lungs can hold more air. Exercise becomes easier and circulation improves.  After 1 year, the risk of coronary heart disease is cut in half.  After 5 years, the risk of stroke falls to the same as a nonsmoker.  After 10 years, the risk of lung cancer is cut in half and the risk of other cancers decreases significantly.  After 15 years, the risk of coronary heart disease drops, usually to the level of a nonsmoker.  You will have extra money to spend on things other than cigarettes.

## 2013-10-17 NOTE — Progress Notes (Signed)
   Subjective:    Patient ID: Autumn Frye, female    DOB: 03-24-1961, 53 y.o.   MRN: [id number]  HPI 53 y.o. female with history of asthma, RF, 30 pack a year smoking history presents with chronic cough. She was treated for thrush on 07/10/13, seen 09/12/13 given Doxy, albuterol which she states did not help her at all, then she came in for CPE on 09/24/2013 and was put on Prednisone and a 21 day of levaquin.   She continues to cough, yellow thick sputum, with wheezing but no SOB. Keeps lower grade temperature. She states she is feeling better but just continues to cough. She also complains of thrush. She has hoarseness but denies globulus sensation or dysphagia. She was having GERD with reflux but it is better now. Cough is worse with lying down, she states coffee in the morning causes her to cough/sputum to come up. She is off her prilosec, proair helps some. She has not had a CXR since.   Review of Systems  Constitutional: Positive for fatigue. Negative for fever and chills.  HENT: Positive for congestion, ear pain (right ear), mouth sores, postnasal drip, rhinorrhea, sore throat and voice change. Negative for sinus pressure, sneezing and trouble swallowing.   Eyes: Negative.   Respiratory: Positive for cough and wheezing. Negative for chest tightness and shortness of breath.   Cardiovascular: Negative.   Genitourinary: Negative.   Musculoskeletal: Negative.        Objective:   Physical Exam  Constitutional: She is oriented to person, place, and time. She appears well-developed and well-nourished.  HENT:  Head: Normocephalic and atraumatic.  Right Ear: Hearing and external ear normal. A middle ear effusion is present.  Left Ear: Hearing, tympanic membrane and external ear normal.  Mouth/Throat: Oropharynx is clear and moist.  White plaques on cheeks and post pharynx, and tongue.   Eyes: Conjunctivae and EOM are normal. Pupils are equal, round, and reactive to light.  Neck: Normal  range of motion. Neck supple. No thyromegaly present.  Cardiovascular: Regular rhythm and normal heart sounds.  Tachycardia present.  Exam reveals no gallop and no friction rub.   No murmur heard. Pulmonary/Chest: Effort normal and breath sounds normal. No respiratory distress. She has no wheezes.  Abdominal: Soft. Bowel sounds are normal. She exhibits no distension and no mass. There is tenderness in the epigastric area. There is no rigidity, no rebound and no guarding.  Musculoskeletal: Normal range of motion.  Lymphadenopathy:    She has no cervical adenopathy.  Neurological: She is alert and oriented to person, place, and time. She displays normal reflexes. No cranial nerve deficit. Coordination normal.  Skin: Skin is warm and dry.  Psychiatric: She has a normal mood and affect.      Assessment & Plan:  Chronic cough with smoking history- we will get CXR, CBC, BP, LFTs,   Rule out cancer/bronchitis- get CXR- which was normal  ? Esophageal candidiasis- diflucan 100 BID 21 days  ? GERD induced asthma- PPI sample given.

## 2013-10-18 ENCOUNTER — Ambulatory Visit (HOSPITAL_COMMUNITY)
Admission: RE | Admit: 2013-10-18 | Discharge: 2013-10-18 | Disposition: A | Discharge status: Home / Self Care | Payer: Medicare Other | Source: Ambulatory Visit | Attending: Internal Medicine | Admitting: Internal Medicine

## 2013-10-18 DIAGNOSIS — Z1231 Encounter for screening mammogram for malignant neoplasm of breast: Secondary | ICD-10-CM

## 2013-10-18 LAB — HIV ANTIBODY (ROUTINE TESTING W REFLEX): HIV: NONREACTIVE

## 2013-10-18 NOTE — Addendum Note (Signed)
Addended by: Vicie Mutters R on: 10/18/2013 01:00 PM   Modules accepted: Orders

## 2013-10-19 ENCOUNTER — Other Ambulatory Visit: Payer: Self-pay | Admitting: Physician Assistant

## 2013-10-19 ENCOUNTER — Ambulatory Visit
Admission: RE | Admit: 2013-10-19 | Discharge: 2013-10-19 | Disposition: A | Discharge status: Home / Self Care | Payer: Medicare Other | Source: Ambulatory Visit | Attending: Physician Assistant | Admitting: Physician Assistant

## 2013-10-19 ENCOUNTER — Encounter: Payer: Self-pay | Admitting: Physician Assistant

## 2013-10-19 DIAGNOSIS — R059 Cough, unspecified: Secondary | ICD-10-CM

## 2013-10-19 DIAGNOSIS — R5383 Other fatigue: Secondary | ICD-10-CM

## 2013-10-19 DIAGNOSIS — R5381 Other malaise: Secondary | ICD-10-CM

## 2013-10-19 DIAGNOSIS — R05 Cough: Secondary | ICD-10-CM

## 2013-10-19 MED ORDER — IOHEXOL 300 MG/ML  SOLN
75.0000 mL | Freq: Once | INTRAMUSCULAR | Status: AC | PRN
  Administered 2013-10-19: 75 mL via INTRAVENOUS

## 2013-10-19 MED ORDER — MAGIC MOUTHWASH W/LIDOCAINE: 5.0000 mL | Freq: Three times a day (TID) | ORAL | Status: DC | PRN

## 2013-11-05 ENCOUNTER — Other Ambulatory Visit: Payer: Self-pay | Admitting: Emergency Medicine

## 2013-12-20 ENCOUNTER — Other Ambulatory Visit: Payer: Self-pay | Admitting: Physician Assistant

## 2013-12-25 ENCOUNTER — Encounter: Payer: Self-pay | Admitting: Physician Assistant

## 2013-12-25 ENCOUNTER — Ambulatory Visit (INDEPENDENT_AMBULATORY_CARE_PROVIDER_SITE_OTHER): Payer: Medicare Other | Admitting: Physician Assistant

## 2013-12-25 VITALS — BP 102/62 | HR 80 | Temp 98.4°F | Resp 16 | Wt 189.0 lb

## 2013-12-25 DIAGNOSIS — R7309 Other abnormal glucose: Secondary | ICD-10-CM

## 2013-12-25 DIAGNOSIS — Z79899 Other long term (current) drug therapy: Secondary | ICD-10-CM

## 2013-12-25 DIAGNOSIS — E559 Vitamin D deficiency, unspecified: Secondary | ICD-10-CM

## 2013-12-25 DIAGNOSIS — E039 Hypothyroidism, unspecified: Secondary | ICD-10-CM

## 2013-12-25 DIAGNOSIS — I10 Essential (primary) hypertension: Secondary | ICD-10-CM

## 2013-12-25 DIAGNOSIS — E782 Mixed hyperlipidemia: Secondary | ICD-10-CM

## 2013-12-25 LAB — CBC WITH DIFFERENTIAL/PLATELET
Basophils Absolute: 0 10*3/uL (ref 0.0–0.1)
Basophils Relative: 0 % (ref 0–1)
Eosinophils Absolute: 0 10*3/uL (ref 0.0–0.7)
Eosinophils Relative: 0 % (ref 0–5)
HCT: 43 % (ref 36.0–46.0)
Hemoglobin: 14.8 g/dL (ref 12.0–15.0)
Lymphocytes Relative: 31 % (ref 12–46)
Lymphs Abs: 2.9 10*3/uL (ref 0.7–4.0)
MCH: 29.8 pg (ref 26.0–34.0)
MCHC: 34.4 g/dL (ref 30.0–36.0)
MCV: 86.5 fL (ref 78.0–100.0)
MONO ABS: 0.6 10*3/uL (ref 0.1–1.0)
Monocytes Relative: 6 % (ref 3–12)
Neutro Abs: 5.9 10*3/uL (ref 1.7–7.7)
Neutrophils Relative %: 63 % (ref 43–77)
Platelets: 344 10*3/uL (ref 150–400)
RBC: 4.97 MIL/uL (ref 3.87–5.11)
RDW: 14.3 % (ref 11.5–15.5)
WBC: 9.3 10*3/uL (ref 4.0–10.5)

## 2013-12-25 NOTE — Progress Notes (Signed)
HPI 53 y.o. female  presents for 3 month follow up with hypertension, hyperlipidemia, prediabetes and vitamin D. Her blood pressure has been controlled at home, today their BP is BP: 102/62 mmHg She does workout, she is able to walk more since her pain is better controlled. She denies chest pain, shortness of breath, dizziness.  She is on cholesterol medication and denies myalgias. Her cholesterol is not at goal. The cholesterol last visit was:   Lab Results  Component Value Date   CHOL 201* 09/24/2013   HDL 52 09/24/2013   LDLCALC 115* 09/24/2013   TRIG 168* 09/24/2013   CHOLHDL 3.9 09/24/2013   She has been working on diet and exercise for prediabetes, and denies paresthesia of the feet, polydipsia, polyuria and visual disturbances. Last A1C in the office was:  Lab Results  Component Value Date   HGBA1C 5.6 09/24/2013   Patient is on Vitamin D supplement.    Patient is now doing Butran pain patch and on medifast. She is doing well.  Wt Readings from Last 3 Encounters:  12/25/13 189 lb (85.73 kg)  09/24/13 191 lb (86.637 kg)  09/12/13 190 lb (86.183 kg)    Current Medications:  Current Outpatient Prescriptions on File Prior to Visit  Medication Sig Dispense Refill  . albuterol (PROVENTIL) (2.5 MG/3ML) 0.083% nebulizer solution Take 3 mLs (2.5 mg total) by nebulization every 6 (six) hours as needed for wheezing or shortness of breath.  60 vial  1  . Alum & Mag Hydroxide-Simeth (MAGIC MOUTHWASH W/LIDOCAINE) SOLN Take 5 mLs by mouth 3 (three) times daily as needed for mouth pain (swish and spit).  5 mL  2  . amitriptyline (ELAVIL) 25 MG tablet Take 25 mg by mouth at bedtime. 1 po a.m. 3 po qhs      . atorvastatin (LIPITOR) 80 MG tablet Take 80 mg by mouth daily at 6 PM. 1/2 MWF      . azithromycin (AZASITE) 1 % ophthalmic solution 1 drop 2 (two) times daily.      . baclofen (LIORESAL) 20 MG tablet TAKE 1/2 TO 1 TABLET BY MOUTH THREE TIMES DAILY AS NEEDED FOR BACK SPASM  90 tablet  0  .  benzonatate (TESSALON) 100 MG capsule Take 1 capsule (100 mg total) by mouth 3 (three) times daily as needed for cough.  60 capsule  1  . Calcium Citrate-Vitamin D (CITRACAL + D PO) Take 1 tablet by mouth daily.       . CHANTIX 1 MG tablet TAKE 1 TABLET BY MOUTH TWICE DAILY  60 tablet  3  . Cholecalciferol (VITAMIN D3) 5000 UNITS TABS Take 5,000 mg by mouth daily.      Marland Kitchen doxycycline (VIBRA-TABS) 100 MG tablet Take 1 tablet (100 mg total) by mouth 2 (two) times daily.  20 tablet  0  . estradiol (CLIMARA - DOSED IN MG/24 HR) 0.05 mg/24hr patch       . ezetimibe (ZETIA) 10 MG tablet Take 10 mg by mouth daily.      Marland Kitchen FEMRING 0.1 MG/24HR RING       . folic acid (FOLVITE) 1 MG tablet Take 2 mg by mouth daily.      . furosemide (LASIX) 40 MG tablet Take 40 mg by mouth daily.      Marland Kitchen levothyroxine (SYNTHROID, LEVOTHROID) 125 MCG tablet Take 150 mcg by mouth daily.       . Magnesium Oxide 500 MG TABS Take 500 mg by mouth daily.      Marland Kitchen  methotrexate 1 G injection Inject as directed once. 0.06 mg injection every Thursday.  Dose reduced to 0.03 mg , but not taking currently due to congestion      . Nutritional Supplements (CARDIO COMPLETE) CAPS Take 1 capsule by mouth.      . nystatin (MYCOSTATIN) 100000 UNIT/ML suspension Take 5 mLs (500,000 Units total) by mouth 4 (four) times daily.  60 mL  0  . omeprazole (PRILOSEC) 40 MG capsule       . oxyCODONE-acetaminophen (PERCOCET/ROXICET) 5-325 MG per tablet Take 1-2 tablets by mouth every 4 (four) hours as needed for pain.  80 tablet  0  . pantoprazole (PROTONIX) 40 MG tablet TAKE 1 TABLET BY MOUTH DAILY  30 tablet  3  . Polyethyl Glycol-Propyl Glycol (SYSTANE) 0.4-0.3 % SOLN Apply 1 drop to eye 4 (four) times daily.      . predniSONE (DELTASONE) 20 MG tablet 1 tab 3 x day for 5 days, then 1 tab 2 x day for 5 days, then 1 tab 1 x day for 5 days  25 tablet  0  . predniSONE (DELTASONE) 5 MG tablet Take 10 mg by mouth daily.       Marland Kitchen PRESCRIPTION MEDICATION Estradiol  patch      . PREVIDENT 5000 SENSITIVE 1.1-5 % PSTE       . PROAIR HFA 108 (90 BASE) MCG/ACT inhaler Inhale 2 puffs into the lungs every 6 (six) hours as needed for wheezing or shortness of breath.        No current facility-administered medications on file prior to visit.   Medical History:  Past Medical History  Diagnosis Date  . Cancer     thyroid-1995  . Hiatal hernia   . GERD (gastroesophageal reflux disease)   . Arthritis     OA, RA  . Asthma   . Hyperlipidemia   . Hypertension   . Hypothyroidism    Allergies:  Allergies  Allergen Reactions  . Hydrocodone Itching  . Morphine And Related Itching  . Compazine [Prochlorperazine Edisylate]   . Levaquin [Levofloxacin] Hives  . Lyrica [Pregabalin]     hallucinations  . Ciprofloxacin Hives and Swelling  . Penicillins Itching  . Sulfa Antibiotics Itching     Review of Systems: [X]  = complains of  [ ]  = denies  General: Fatigue [ ]  Fever [ ]  Chills [ ]  Weakness [ ]   Insomnia [ ]  Eyes: Redness [ ]  Blurred vision [ ]  Diplopia [ ]   ENT: Congestion [ ]  Sinus Pain [ ]  Post Nasal Drip [ ]  Sore Throat [ ]  Earache [ ]   Cardiac: Chest pain/pressure [ ]  SOB [ ]  Orthopnea [ ]   Palpitations [ ]   Paroxysmal nocturnal dyspnea[ ]  Claudication [ ]  Edema [ ]   Pulmonary: Cough [ ]  Wheezing[ ]   SOB [ ]   Snoring [ ]   GI: Nausea [ ]  Vomiting[ ]  Dysphagia[ ]  Heartburn[ ]  Abdominal pain [ ]  Constipation [ ] ; Diarrhea [ ] ; BRBPR [ ]  Melena[ ]  GU: Hematuria[ ]  Dysuria [ ]  Nocturia[ ]  Urgency [ ]   Hesitancy [ ]  Discharge [ ]  Neuro: Headaches[ ]  Vertigo[ ]  Paresthesias[ ]  Spasm [ ]  Speech changes [ ]  Incoordination [ ]   Ortho: Arthritis [ ]  Joint pain Valu.Nieves ] Muscle pain [ ]  Joint swelling Valu.Nieves ] Back Pain [ ]  Skin:  Rash [ ]   Pruritis [ ]  Change in skin lesion Valu.Nieves ]  Psych: Depression[ ]  Anxiety[ ]  Confusion [ ]  Memory loss [ ]   Heme/Lypmh: Bleeding [ ]  Bruising [ ]  Enlarged lymph nodes [ ]   Endocrine: Visual blurring [ ]  Paresthesia [ ]  Polyuria [ ]   Polydypsea [ ]    Heat/cold intolerance [ ]  Hypoglycemia [ ]   Family history- Review and unchanged Social history- Review and unchanged Physical Exam: BP 102/62  Pulse 80  Temp(Src) 98.4 F (36.9 C)  Resp 16  Wt 189 lb (85.73 kg) Wt Readings from Last 3 Encounters:  12/25/13 189 lb (85.73 kg)  09/24/13 191 lb (86.637 kg)  09/12/13 190 lb (86.183 kg)   General Appearance: Well nourished, in no apparent distress. Eyes: PERRLA, EOMs, conjunctiva no swelling or erythema Sinuses: No Frontal/maxillary tenderness ENT/Mouth: Ext aud canals clear, TMs without erythema, bulging. No erythema, swelling, or exudate on post pharynx.  Tonsils not swollen or erythematous. Hearing normal.  Neck: Supple, thyroid normal.  Respiratory: Respiratory effort normal, BS equal bilaterally without rales, rhonchi, wheezing or stridor.  Cardio: RRR with no MRGs. Brisk peripheral pulses without edema.  Abdomen: Soft, + BS.  Non tender, no guarding, rebound, hernias, masses. Lymphatics: Non tender without lymphadenopathy.  Musculoskeletal: Full ROM, 5/5 strength, normal gait.  Skin: Warm, dry without rashes, lesions, ecchymosis. Abscess, nontender/not inflammed right axillary Neuro: Cranial nerves intact. Normal muscle tone, no cerebellar symptoms. Sensation intact.  Psych: Awake and oriented X 3, normal affect, Insight and Judgment appropriate.   Assessment and Plan:  Hypertension: Continue medication, monitor blood pressure at home.  Continue DASH diet. Cholesterol: Continue diet and exercise. Check cholesterol.  Pre-diabetes-Continue diet and exercise. Check A1C Vitamin D Def- check level and continue medications.  Obesity- she is on Medifast and working on weightloss.  RA and chronic pain- she is doing well , continue to follow up with Dr. Ouida Sills  Continue diet and meds as discussed. Further disposition pending results of labs.  Vicie Mutters 10:41 AM

## 2013-12-25 NOTE — Patient Instructions (Addendum)
We are giving you chantix for smoking cessation. You can do it! And we are here to help! You may have heard some scary side effects about chantix, the three most common I hear about are nausea, crazy dreams and depression.  However, I like for my patients to try to stay on 1/2 a tablet twice a day rather than one tablet twice a day as normally prescribed. This helps decrease the chances of side effects and helps save money by making a one month prescription last two months  Please start the prescription this way:  Start 1/2 tablet by mouth once daily after food with a full glass of water for 3 days Then do 1/2 tablet by mouth twice daily for 4 days.  At this point we have several options: 1) continue on 1/2 tablet twice a day- which I encourage you to do. You can stay on this dose the rest of the time on the medication or if you still feel the need to smoke you can do one of the two options below. 2) do one tablet in the morning and 1/2 in the evening which helps decrease dreams. 3) do one tablet twice a day.   What if I miss a dose? If you miss a dose, take it as soon as you can. If it is almost time for your next dose, take only that dose. Do not take double or extra doses.  What should I watch for while using this medicine? Visit your doctor or health care professional for regular check ups. Ask for ongoing advice and encouragement from your doctor or healthcare professional, friends, and family to help you quit. If you smoke while on this medication, quit again  Your mouth may get dry. Chewing sugarless gum or hard candy, and drinking plenty of water may help. Contact your doctor if the problem does not go away or is severe.  You may get drowsy or dizzy. Do not drive, use machinery, or do anything that needs mental alertness until you know how this medicine affects you. Do not stand or sit up quickly, especially if you are an older patient.   The use of this medicine may increase the chance  of suicidal thoughts or actions. Pay special attention to how you are responding while on this medicine. Any worsening of mood, or thoughts of suicide or dying should be reported to your health care professional right away.  ADVANTAGES OF QUITTING SMOKING  Within 20 minutes, blood pressure decreases. Your pulse is at normal level.  After 8 hours, carbon monoxide levels in the blood return to normal. Your oxygen level increases.  After 24 hours, the chance of having a heart attack starts to decrease. Your breath, hair, and body stop smelling like smoke.  After 48 hours, damaged nerve endings begin to recover. Your sense of taste and smell improve.  After 72 hours, the body is virtually free of nicotine. Your bronchial tubes relax and breathing becomes easier.  After 2 to 12 weeks, lungs can hold more air. Exercise becomes easier and circulation improves.  After 1 year, the risk of coronary heart disease is cut in half.  After 5 years, the risk of stroke falls to the same as a nonsmoker.  After 10 years, the risk of lung cancer is cut in half and the risk of other cancers decreases significantly.  After 15 years, the risk of coronary heart disease drops, usually to the level of a nonsmoker.  You will have extra money   to spend on things other than cigarettes.    Bad carbs also include fruit juice, alcohol, and sweet tea. These are empty calories that do not signal to your brain that you are full.   Please remember the good carbs are still carbs which convert into sugar. So please measure them out no more than 1/2-1 cup of rice, oatmeal, pasta, and beans.  Veggies are however free foods! Pile them on.   I like lean protein at every meal such as chicken, turkey, pork chops, cottage cheese, etc. Just do not fry these meats and please center your meal around vegetable, the meats should be a side dish.   No all fruit is created equal. Please see the list below, the fruit at the bottom is  higher in sugars than the fruit at the top    

## 2013-12-26 LAB — VITAMIN D 25 HYDROXY (VIT D DEFICIENCY, FRACTURES): VIT D 25 HYDROXY: 75 ng/mL (ref 30–89)

## 2013-12-26 LAB — INSULIN, FASTING: INSULIN FASTING, SERUM: 11 u[IU]/mL (ref 3–28)

## 2013-12-26 LAB — HEPATIC FUNCTION PANEL
ALBUMIN: 4.6 g/dL (ref 3.5–5.2)
ALT: 26 U/L (ref 0–35)
AST: 26 U/L (ref 0–37)
Alkaline Phosphatase: 78 U/L (ref 39–117)
BILIRUBIN INDIRECT: 0.3 mg/dL (ref 0.2–1.2)
Bilirubin, Direct: 0.1 mg/dL (ref 0.0–0.3)
Total Bilirubin: 0.4 mg/dL (ref 0.2–1.2)
Total Protein: 7.3 g/dL (ref 6.0–8.3)

## 2013-12-26 LAB — LIPID PANEL
Cholesterol: 217 mg/dL — ABNORMAL HIGH (ref 0–200)
HDL: 74 mg/dL (ref >39)
LDL CALC: 134 mg/dL — AB (ref 0–99)
TRIGLYCERIDES: 47 mg/dL (ref <150)
Total CHOL/HDL Ratio: 2.9 Ratio
VLDL: 9 mg/dL (ref 0–40)

## 2013-12-26 LAB — BASIC METABOLIC PANEL WITH GFR
BUN: 17 mg/dL (ref 6–23)
CALCIUM: 10 mg/dL (ref 8.4–10.5)
CO2: 28 mEq/L (ref 19–32)
CREATININE: 0.59 mg/dL (ref 0.50–1.10)
Chloride: 97 mEq/L (ref 96–112)
GFR, Est African American: >89 mL/min
Glucose, Bld: 73 mg/dL (ref 70–99)
Potassium: 4 mEq/L (ref 3.5–5.3)
Sodium: 137 mEq/L (ref 135–145)

## 2013-12-26 LAB — MAGNESIUM: MAGNESIUM: 2.1 mg/dL (ref 1.5–2.5)

## 2013-12-26 LAB — HEMOGLOBIN A1C
Hgb A1c MFr Bld: 5.6 % (ref <5.7)
Mean Plasma Glucose: 114 mg/dL (ref <117)

## 2013-12-26 LAB — TSH: TSH: 0.418 u[IU]/mL (ref 0.350–4.500)

## 2014-03-26 ENCOUNTER — Ambulatory Visit (INDEPENDENT_AMBULATORY_CARE_PROVIDER_SITE_OTHER): Payer: Medicare Other | Admitting: Internal Medicine

## 2014-03-26 ENCOUNTER — Encounter: Payer: Self-pay | Admitting: Internal Medicine

## 2014-03-26 VITALS — BP 118/74 | HR 88 | Temp 97.9°F | Resp 16 | Ht 63.0 in | Wt 182.4 lb

## 2014-03-26 DIAGNOSIS — I10 Essential (primary) hypertension: Secondary | ICD-10-CM

## 2014-03-26 DIAGNOSIS — E559 Vitamin D deficiency, unspecified: Secondary | ICD-10-CM

## 2014-03-26 DIAGNOSIS — E782 Mixed hyperlipidemia: Secondary | ICD-10-CM

## 2014-03-26 DIAGNOSIS — Z79899 Other long term (current) drug therapy: Secondary | ICD-10-CM

## 2014-03-26 DIAGNOSIS — R7309 Other abnormal glucose: Secondary | ICD-10-CM

## 2014-03-26 LAB — CBC WITH DIFFERENTIAL/PLATELET
Basophils Absolute: 0 10*3/uL (ref 0.0–0.1)
Basophils Relative: 0 % (ref 0–1)
Eosinophils Absolute: 0.1 10*3/uL (ref 0.0–0.7)
Eosinophils Relative: 1 % (ref 0–5)
HCT: 42.7 % (ref 36.0–46.0)
Hemoglobin: 14.9 g/dL (ref 12.0–15.0)
LYMPHS ABS: 3.6 10*3/uL (ref 0.7–4.0)
LYMPHS PCT: 45 % (ref 12–46)
MCH: 29.8 pg (ref 26.0–34.0)
MCHC: 34.9 g/dL (ref 30.0–36.0)
MCV: 85.4 fL (ref 78.0–100.0)
Monocytes Absolute: 0.3 10*3/uL (ref 0.1–1.0)
Monocytes Relative: 4 % (ref 3–12)
NEUTROS ABS: 4 10*3/uL (ref 1.7–7.7)
NEUTROS PCT: 50 % (ref 43–77)
PLATELETS: 319 10*3/uL (ref 150–400)
RBC: 5 MIL/uL (ref 3.87–5.11)
RDW: 14.7 % (ref 11.5–15.5)
WBC: 7.9 10*3/uL (ref 4.0–10.5)

## 2014-03-26 LAB — BASIC METABOLIC PANEL WITH GFR
BUN: 13 mg/dL (ref 6–23)
CHLORIDE: 101 meq/L (ref 96–112)
CO2: 26 mEq/L (ref 19–32)
Calcium: 9.6 mg/dL (ref 8.4–10.5)
Creat: 0.65 mg/dL (ref 0.50–1.10)
GFR, Est African American: >89 mL/min
Glucose, Bld: 96 mg/dL (ref 70–99)
Potassium: 4.4 mEq/L (ref 3.5–5.3)
SODIUM: 139 meq/L (ref 135–145)

## 2014-03-26 LAB — MAGNESIUM: Magnesium: 1.8 mg/dL (ref 1.5–2.5)

## 2014-03-26 LAB — HEPATIC FUNCTION PANEL
ALK PHOS: 58 U/L (ref 39–117)
ALT: 27 U/L (ref 0–35)
AST: 24 U/L (ref 0–37)
Albumin: 4.1 g/dL (ref 3.5–5.2)
BILIRUBIN TOTAL: 0.4 mg/dL (ref 0.2–1.2)
Bilirubin, Direct: 0.1 mg/dL (ref 0.0–0.3)
Indirect Bilirubin: 0.3 mg/dL (ref 0.2–1.2)
Total Protein: 6.6 g/dL (ref 6.0–8.3)

## 2014-03-26 LAB — LIPID PANEL
CHOL/HDL RATIO: 5 ratio
Cholesterol: 271 mg/dL — ABNORMAL HIGH (ref 0–200)
HDL: 54 mg/dL (ref >39)
LDL Cholesterol: 186 mg/dL — ABNORMAL HIGH (ref 0–99)
Triglycerides: 157 mg/dL — ABNORMAL HIGH (ref <150)
VLDL: 31 mg/dL (ref 0–40)

## 2014-03-26 LAB — TSH: TSH: 3.403 u[IU]/mL (ref 0.350–4.500)

## 2014-03-26 NOTE — Progress Notes (Signed)
Patient ID: Autumn Frye, female   DOB: 30-May-1961, 53 y.o.   MRN: [id number]   This very nice 53 y.o.female presents for 3 month follow up with Hypertension, Hyperlipidemia, Pre-Diabetes, Rh Arthritis & DJD and Vitamin D Deficiency.    Patient apparently intolerant to several DMARD meds and had a reaction to a Zostavax. She is followed by Dr Ouida Sills and recently was started on Butrans patch with improved pain control.Marland Kitchen   HTN predates since 2000. BP has been controlled at home. Today's BP: 118/74 mmHg. Patient denies any cardiac type chest pain, palpitations, dyspnea/orthopnea/PND, dizziness, claudication, or dependent edema.   Hyperlipidemia is not controlled with diet & meds. Patient denies myalgias or other med SE's. Last Lipids were not at goal as below. Lab Results  Component Value Date   CHOL 217* 12/25/2013   HDL 74 12/25/2013   LDLCALC 134* 12/25/2013   TRIG 47 12/25/2013   CHOLHDL 2.9 12/25/2013    Also, the patient has history of PreDiabetes since 2010 with a with A1c 5.9% and last A1c was 5.6% in Apr 2015.  Patient denies any symptoms of reactive hypoglycemia, diabetic polys, paresthesias or visual blurring.   Further, Patient has history of Vitamin D Deficiency of 27 in 2008 and last vitamin D was 75 in Apr 2015. Patient supplements vitamin D without any suspected side-effects.   Medication List   amitriptyline 25 MG tablet  Commonly known as:  ELAVIL  Take 25 mg by mouth at bedtime. 1 po a.m. 3 po qhs     atorvastatin 80 MG tablet  Commonly known as:  LIPITOR  Take 80 mg by mouth daily at 6 PM. 1/2 MWF     AZASITE 1 % ophthalmic solution  Generic drug:  azithromycin  1 drop 2 (two) times daily.     baclofen 20 MG tablet  Commonly known as:  LIORESAL  TAKE 1/2 TO 1 TABLET BY MOUTH THREE TIMES DAILY AS NEEDED FOR BACK SPASM     CARDIO COMPLETE Caps  Take 1 capsule by mouth.     CHANTIX 1 MG tablet  Generic drug:  varenicline  TAKE 1 TABLET BY MOUTH TWICE DAILY     CITRACAL + D PO  Take 1 tablet by mouth daily.     estradiol 0.05 mg/24hr patch  Commonly known as:  CLIMARA - Dosed in mg/24 hr     ezetimibe 10 MG tablet  Commonly known as:  ZETIA  Take 10 mg by mouth daily.     FEMRING 0.1 MG/24HR Ring  Generic drug:  Estradiol Acetate     folic acid 1 MG tablet  Commonly known as:  FOLVITE  Take 2 mg by mouth daily.     furosemide 40 MG tablet  Commonly known as:  LASIX  Take 40 mg by mouth daily.     levothyroxine 125 MCG tablet  Commonly known as:  SYNTHROID, LEVOTHROID  Take 150 mcg by mouth daily.     magic mouthwash w/lidocaine Soln  Take 5 mLs by mouth 3 (three) times daily as needed for mouth pain (swish and spit).     Magnesium Oxide 500 MG Tabs  Take 500 mg by mouth daily.     methotrexate 1 G injection  Commonly known as:  50 mg/ml  Inject as directed once. 0.06 mg injection every Thursday.  Dose reduced to 0.03 mg , but not taking currently due to congestion     nystatin 100000 UNIT/ML suspension  Commonly known as:  MYCOSTATIN  Take 5 mLs (500,000 Units total) by mouth 4 (four) times daily.     omeprazole 40 MG capsule  Commonly known as:  PRILOSEC     oxyCODONE-acetaminophen 5-325 MG per tablet  Commonly known as:  PERCOCET/ROXICET  Take 1-2 tablets by mouth every 4 (four) hours as needed for pain.     pantoprazole 40 MG tablet  Commonly known as:  PROTONIX  TAKE 1 TABLET BY MOUTH DAILY     predniSONE 5 MG tablet  Commonly known as:  DELTASONE  Take 10 mg by mouth daily.     PREVIDENT 5000 SENSITIVE 1.1-5 % Pste  Generic drug:  Sod Fluoride-Potassium Nitrate     PROAIR HFA 108 (90 BASE) MCG/ACT inhaler  Generic drug:  albuterol  Inhale 2 puffs into the lungs every 6 (six) hours as needed for wheezing or shortness of breath.     albuterol (2.5 MG/3ML) 0.083% nebulizer solution  Commonly known as:  PROVENTIL  Take 3 mLs (2.5 mg total) by nebulization every 6 (six) hours as needed for wheezing or shortness  of breath.     SYSTANE 0.4-0.3 % Soln  Generic drug:  Polyethyl Glycol-Propyl Glycol  Apply 1 drop to eye 4 (four) times daily.     Vitamin D3 5000 UNITS Tabs  Take 5,000 mg by mouth daily.     Allergies  Allergen Reactions  . Hydrocodone Itching  . Morphine And Related Itching  . Compazine [Prochlorperazine Edisylate]   . Levaquin [Levofloxacin] Hives  . Lyrica [Pregabalin]     hallucinations  . Ciprofloxacin Hives and Swelling  . Penicillins Itching  . Sulfa Antibiotics Itching   PMHx:   Past Medical History  Diagnosis Date  . Cancer     thyroid-1995  . Hiatal hernia   . GERD (gastroesophageal reflux disease)   . Arthritis     OA, RA  . Asthma   . Hyperlipidemia   . Hypertension   . Hypothyroidism    FHx:    Reviewed / unchanged SHx:    Reviewed / unchanged  Systems Review:  Constitutional: Denies fever, chills, wt changes, headaches, insomnia, fatigue, night sweats, change in appetite. Eyes: Denies redness, blurred vision, diplopia, discharge, itchy, watery eyes.  ENT: Denies discharge, congestion, post nasal drip, epistaxis, sore throat, earache, hearing loss, dental pain, tinnitus, vertigo, sinus pain, snoring.  CV: Denies chest pain, palpitations, irregular heartbeat, syncope, dyspnea, diaphoresis, orthopnea, PND, claudication or edema. Respiratory: denies cough, dyspnea, DOE, pleurisy, hoarseness, laryngitis, wheezing.  Gastrointestinal: Denies dysphagia, odynophagia, heartburn, reflux, water brash, abdominal pain or cramps, nausea, vomiting, bloating, diarrhea, constipation, hematemesis, melena, hematochezia  or hemorrhoids. Genitourinary: Denies dysuria, frequency, urgency, nocturia, hesitancy, discharge, hematuria or flank pain. Musculoskeletal: C/o diffuse arthralgias - esp hands and also myalgias, but denies stiffness, jt. Swelling, limping or strain/sprain.  Skin: Denies pruritus, rash, hives, warts, acne, eczema or change in skin lesion(s). Neuro: No  weakness, tremor, incoordination, spasms, paresthesia or pain. Psychiatric: Denies confusion, memory loss or sensory loss. Endo: Denies change in weight, skin or hair change.  Heme/Lymph: No excessive bleeding, bruising or enlarged lymph nodes.  Exam:  BP 118/74  Pulse 88  Temp(Src) 97.9 F (36.6 C) (Temporal)  Resp 16  Ht 5\' 3"  (1.6 m)  Wt 182 lb 6.4 oz (82.736 kg)  BMI 32.32 kg/m2  Appears well nourished and in no distress. Eyes: PERRLA, EOMs, conjunctiva no swelling or erythema. Sinuses: No frontal/maxillary tenderness ENT/Mouth: EAC's clear, TM's nl w/o erythema, bulging. Nares clear  w/o erythema, swelling, exudates. Oropharynx clear without erythema or exudates. Oral hygiene is good. Tongue normal, non obstructing. Hearing intact.  Neck: Supple. Thyroid nl. Car 2+/2+ without bruits, nodes or JVD. Chest: Respirations nl with BS clear & equal w/o rales, rhonchi, wheezing or stridor.  Cor: Heart sounds normal w/ regular rate and rhythm without sig. murmurs, gallops, clicks, or rubs. Peripheral pulses normal and equal  without edema.  Abdomen: Soft & bowel sounds normal. Non-tender w/o guarding, rebound, hernias, masses, or organomegaly.  Lymphatics: Unremarkable.  Musculoskeletal: Full ROM all peripheral extremities, 5/5 strength, and normal gait.  Skin: Warm, dry without exposed rashes, lesions or ecchymosis apparent.  Neuro: Cranial nerves intact, reflexes equal bilaterally. Sensory-motor testing grossly intact. Tendon reflexes grossly intact.  Pysch: Alert & oriented x 3. Insight and judgement nl & appropriate. No ideations.  Assessment and Plan:  1. Hypertension - Continue monitor blood pressure at home. Continue diet/meds same.  2. Hyperlipidemia - Continue diet/meds, exercise,& lifestyle modifications. Continue monitor periodic cholesterol/liver & renal functions   3. Pre-Diabetes - Continue diet, exercise, lifestyle modifications. Monitor appropriate labs.  4. Vitamin D  Deficiency - Continue supplementation.  5. Rh Arthritis/ Osteoarthritis - Rx per Dr Ouida Sills  Recommended regular exercise, BP monitoring, weight control, and discussed med and SE's. Recommended labs to assess and monitor clinical status. Further disposition pending results of labs.

## 2014-03-26 NOTE — Patient Instructions (Signed)

## 2014-03-27 ENCOUNTER — Encounter: Payer: Self-pay | Admitting: Internal Medicine

## 2014-03-27 LAB — HEMOGLOBIN A1C
HEMOGLOBIN A1C: 5.6 % (ref <5.7)
Mean Plasma Glucose: 114 mg/dL (ref <117)

## 2014-03-27 LAB — INSULIN, FASTING: INSULIN FASTING, SERUM: 59 u[IU]/mL — AB (ref 3–28)

## 2014-03-27 LAB — VITAMIN D 25 HYDROXY (VIT D DEFICIENCY, FRACTURES): Vit D, 25-Hydroxy: 64 ng/mL (ref 30–89)

## 2014-04-23 ENCOUNTER — Other Ambulatory Visit: Payer: Self-pay | Admitting: Internal Medicine

## 2014-05-27 ENCOUNTER — Ambulatory Visit (INDEPENDENT_AMBULATORY_CARE_PROVIDER_SITE_OTHER): Payer: Medicare Other | Admitting: Internal Medicine

## 2014-05-27 ENCOUNTER — Encounter: Payer: Self-pay | Admitting: Internal Medicine

## 2014-05-27 VITALS — BP 104/62 | HR 102 | Temp 98.2°F | Resp 18 | Ht 63.0 in | Wt 182.0 lb

## 2014-05-27 DIAGNOSIS — J019 Acute sinusitis, unspecified: Secondary | ICD-10-CM

## 2014-05-27 DIAGNOSIS — J0410 Acute tracheitis without obstruction: Secondary | ICD-10-CM

## 2014-05-27 MED ORDER — BENZONATATE 100 MG PO CAPS: ORAL_CAPSULE | ORAL | Status: DC

## 2014-05-27 MED ORDER — AZITHROMYCIN 250 MG PO TABS: ORAL_TABLET | ORAL | Status: DC

## 2014-05-27 MED ORDER — PREDNISONE 20 MG PO TABS: ORAL_TABLET | ORAL | Status: DC

## 2014-05-27 NOTE — Patient Instructions (Signed)
Sinusitis Sinusitis is redness, soreness, and inflammation of the paranasal sinuses. Paranasal sinuses are air pockets within the bones of your face (beneath the eyes, the middle of the forehead, or above the eyes). In healthy paranasal sinuses, mucus is able to drain out, and air is able to circulate through them by way of your nose. However, when your paranasal sinuses are inflamed, mucus and air can become trapped. This can allow bacteria and other germs to grow and cause infection. Sinusitis can develop quickly and last only a short time (acute) or continue over a long period (chronic). Sinusitis that lasts for more than 12 weeks is considered chronic.  CAUSES  Causes of sinusitis include:  Allergies.  Structural abnormalities, such as displacement of the cartilage that separates your nostrils (deviated septum), which can decrease the air flow through your nose and sinuses and affect sinus drainage.  Functional abnormalities, such as when the small hairs (cilia) that line your sinuses and help remove mucus do not work properly or are not present. SIGNS AND SYMPTOMS  Symptoms of acute and chronic sinusitis are the same. The primary symptoms are pain and pressure around the affected sinuses. Other symptoms include:  Upper toothache.  Earache.  Headache.  Bad breath.  Decreased sense of smell and taste.  A cough, which worsens when you are lying flat.  Fatigue.  Fever.  Thick drainage from your nose, which often is green and may contain pus (purulent).  Swelling and warmth over the affected sinuses. DIAGNOSIS  Your health care provider will perform a physical exam. During the exam, your health care provider may:  Look in your nose for signs of abnormal growths in your nostrils (nasal polyps).  Tap over the affected sinus to check for signs of infection.  View the inside of your sinuses (endoscopy) using an imaging device that has a light attached (endoscope). If your health  care provider suspects that you have chronic sinusitis, one or more of the following tests may be recommended:  Allergy tests.  Nasal culture. A sample of mucus is taken from your nose, sent to a lab, and screened for bacteria.  Nasal cytology. A sample of mucus is taken from your nose and examined by your health care provider to determine if your sinusitis is related to an allergy. TREATMENT  Most cases of acute sinusitis are related to a viral infection and will resolve on their own within 10 days. Sometimes medicines are prescribed to help relieve symptoms (pain medicine, decongestants, nasal steroid sprays, or saline sprays).  However, for sinusitis related to a bacterial infection, your health care provider will prescribe antibiotic medicines. These are medicines that will help kill the bacteria causing the infection.  Rarely, sinusitis is caused by a fungal infection. In theses cases, your health care provider will prescribe antifungal medicine. For some cases of chronic sinusitis, surgery is needed. Generally, these are cases in which sinusitis recurs more than 3 times per year, despite other treatments. HOME CARE INSTRUCTIONS   Drink plenty of water. Water helps thin the mucus so your sinuses can drain more easily.  Use a humidifier.  Inhale steam 3 to 4 times a day (for example, sit in the bathroom with the shower running).  Apply a warm, moist washcloth to your face 3 to 4 times a day, or as directed by your health care provider.  Use saline nasal sprays to help moisten and clean your sinuses.  Take medicines only as directed by your health care provider.    If you were prescribed either an antibiotic or antifungal medicine, finish it all even if you start to feel better. °SEEK IMMEDIATE MEDICAL CARE IF: °· You have increasing pain or severe headaches. °· You have nausea, vomiting, or drowsiness. °· You have swelling around your face. °· You have vision problems. °· You have a stiff  neck. °· You have difficulty breathing. °MAKE SURE YOU:  °· Understand these instructions. °· Will watch your condition. °· Will get help right away if you are not doing well or get worse. °Document Released: 08/23/2005 Document Revised: 01/07/2014 Document Reviewed: 09/07/2011 °[employer]® Patient Information ©2015 [employer]. This information is not intended to replace advice given to you by your health care provider. Make sure you discuss any questions you have with your health care provider. °Acute Bronchitis °Bronchitis is inflammation of the airways that extend from the windpipe into the lungs (bronchi). The inflammation often causes mucus to develop. This leads to a cough, which is the most common symptom of bronchitis.  °In acute bronchitis, the condition usually develops suddenly and goes away over time, usually in a couple weeks. Smoking, allergies, and asthma can make bronchitis worse. Repeated episodes of bronchitis may cause further lung problems.  °CAUSES °Acute bronchitis is most often caused by the same virus that causes a cold. The virus can spread from person to person (contagious) through coughing, sneezing, and touching contaminated objects. °SIGNS AND SYMPTOMS  °· Cough.   °· Fever.   °· Coughing up mucus.   °· Body aches.   °· Chest congestion.   °· Chills.   °· Shortness of breath.   °· Sore throat.   °DIAGNOSIS  °Acute bronchitis is usually diagnosed through a physical exam. Your health care provider will also ask you questions about your medical history. Tests, such as chest X-rays, are sometimes done to rule out other conditions.  °TREATMENT  °Acute bronchitis usually goes away in a couple weeks. Oftentimes, no medical treatment is necessary. Medicines are sometimes given for relief of fever or cough. Antibiotic medicines are usually not needed but may be prescribed in certain situations. In some cases, an inhaler may be recommended to help reduce shortness of breath and control the cough.  A cool mist vaporizer may also be used to help thin bronchial secretions and make it easier to clear the chest.  °HOME CARE INSTRUCTIONS °· Get plenty of rest.   °· Drink enough fluids to keep your urine clear or pale yellow (unless you have a medical condition that requires fluid restriction). Increasing fluids may help thin your respiratory secretions (sputum) and reduce chest congestion, and it will prevent dehydration.   °· Take medicines only as directed by your health care provider. °· If you were prescribed an antibiotic medicine, finish it all even if you start to feel better. °· Avoid smoking and secondhand smoke. Exposure to cigarette smoke or irritating chemicals will make bronchitis worse. If you are a smoker, consider using nicotine gum or skin patches to help control withdrawal symptoms. Quitting smoking will help your lungs heal faster.   °· Reduce the chances of another bout of acute bronchitis by washing your hands frequently, avoiding people with cold symptoms, and trying not to touch your hands to your mouth, nose, or eyes.   °· Keep all follow-up visits as directed by your health care provider.   °SEEK MEDICAL CARE IF: °Your symptoms do not improve after 1 week of treatment.  °SEEK IMMEDIATE MEDICAL CARE IF: °· You develop an increased fever or chills.   °· You have chest pain.   °·   You have severe shortness of breath. °· You have bloody sputum.   °· You develop dehydration. °· You faint or repeatedly feel like you are going to pass out. °· You develop repeated vomiting. °· You develop a severe headache. °MAKE SURE YOU:  °· Understand these instructions. °· Will watch your condition. °· Will get help right away if you are not doing well or get worse. °Document Released: 09/30/2004 Document Revised: 01/07/2014 Document Reviewed: 02/13/2013 °Exit[employer]® Patient Information ©2015 [employer]. This information is not intended to replace advice given to you by your health care provider. Make sure you  discuss any questions you have with your health care provider. ° °

## 2014-05-27 NOTE — Progress Notes (Signed)
Subjective:    Patient ID: Autumn Frye, female    DOB: [birth year], 53 y.o.   MRN: [id number]  Cough This is a new problem. The current episode started in the past 7 days. The problem has been waxing and waning. The problem occurs every few minutes. The cough is productive of purulent sputum. Associated symptoms include chest pain, chills, ear congestion, ear pain, a fever, headaches, nasal congestion, a sore throat, shortness of breath, sweats and wheezing. Pertinent negatives include no eye redness, heartburn, hemoptysis, myalgias, postnasal drip, rash or rhinorrhea. The symptoms are aggravated by dust and fumes. She has tried a beta-agonist inhaler and OTC cough suppressant for the symptoms. The treatment provided no relief. Her past medical history is significant for asthma and bronchitis.  Fever  The current episode started in the past 7 days. The problem occurs intermittently. The problem has been waxing and waning. Her temperature was unmeasured prior to arrival. Associated symptoms include chest pain, coughing, ear pain, headaches, nausea, a sore throat and wheezing. Pertinent negatives include no abdominal pain, muscle aches, rash or vomiting. She has tried acetaminophen for the symptoms. The treatment provided no relief.  Headache  This is a new problem. The current episode started in the past 7 days. The problem occurs intermittently. The problem has been waxing and waning. The pain is located in the bilateral and frontal region. The pain radiates to the face. The pain quality is similar to prior headaches. The quality of the pain is described as aching, stabbing and pulsating. The pain is mild. Associated symptoms include coughing, ear pain, a fever, nausea, sinus pressure, a sore throat and swollen glands. Pertinent negatives include no abdominal pain, anorexia, dizziness, drainage, eye pain, eye redness, eye watering, facial sweating, hearing loss, loss of balance, muscle aches, neck  pain, numbness, phonophobia, photophobia, rhinorrhea, scalp tenderness, seizures, tingling, tinnitus, visual change, vomiting or weakness. The symptoms are aggravated by activity. She has tried acetaminophen for the symptoms. The treatment provided no relief. Her past medical history is significant for hypertension, obesity and sinus disease.  Sore Throat  This is a new problem. The current episode started in the past 7 days. The problem has been waxing and waning. Neither side of throat is experiencing more pain than the other. The pain is mild. Associated symptoms include coughing, ear pain, headaches, shortness of breath and swollen glands. Pertinent negatives include no abdominal pain, neck pain or vomiting. She has tried acetaminophen for the symptoms. The treatment provided no relief.   Review of Systems  Constitutional: Positive for fever and chills.  HENT: Positive for ear pain, sinus pressure and sore throat. Negative for hearing loss, postnasal drip, rhinorrhea and tinnitus.   Eyes: Negative for photophobia, pain and redness.  Respiratory: Positive for cough, shortness of breath and wheezing. Negative for hemoptysis.   Cardiovascular: Positive for chest pain.  Gastrointestinal: Positive for nausea. Negative for heartburn, vomiting, abdominal pain and anorexia.  Genitourinary: Negative.   Musculoskeletal: Negative for myalgias and neck pain.  Skin: Negative for rash.  Neurological: Positive for headaches. Negative for dizziness, tingling, seizures, speech difficulty, weakness, light-headedness, numbness and loss of balance.   Medication Sig  . albuterol (PROVENTIL) (2.5 MG/3ML) 0.083% nebulizer solution Take 3 mLs (2.5 mg total) by nebulization every 6 (six) hours as needed for wheezing or shortness of breath.  Marland Kitchen amitriptyline (ELAVIL) 25 MG tablet Take 25 mg by mouth at bedtime. 1 po a.m. 3 po qhs  . atorvastatin (LIPITOR) 80 MG  tablet Take 80 mg by mouth daily at 6 PM. 1/2 MWF  .  azithromycin (AZASITE) 1 % ophthalmic solution 1 drop 2 (two) times daily.  . buprenorphine (BUTRANS) 10 MCG/HR PTWK patch Place 10 mcg onto the skin once a week.  . Calcium Citrate-Vitamin D (CITRACAL + D PO) Take 1 tablet by mouth daily.   . CHANTIX 1 MG tablet TAKE 1 TABLET BY MOUTH TWICE DAILY  . Cholecalciferol (VITAMIN D3) 5000 UNITS TABS Take 5,000 mg by mouth daily.  Marland Kitchen estradiol (CLIMARA - DOSED IN MG/24 HR) 0.05 mg/24hr patch   . furosemide (LASIX) 40 MG tablet TAKE 1 TABLET BY MOUTH THREE TIMES DAILY FOR FLUID RETENTION  . levothyroxine (SYNTHROID, LEVOTHROID) 125 MCG tablet Take 150 mcg by mouth daily.   . Magnesium Oxide 500 MG TABS Take 500 mg by mouth daily.  . Nutritional Supplements (CARDIO COMPLETE) CAPS Take 1 capsule by mouth.  Vladimir Faster Glycol-Propyl Glycol (SYSTANE) 0.4-0.3 % SOLN Apply 1 drop to eye 4 (four) times daily.  Marland Kitchen PREVIDENT 5000 SENSITIVE 1.1-5 % PSTE   . PROAIR HFA 108 (90 BASE) MCG/ACT inhaler Inhale 2 puffs into the lungs every 6 (six) hours as needed for wheezing or shortness of breath.    Allergies  Allergen Reactions  . Hydrocodone Itching  . Morphine And Related Itching  . Compazine [Prochlorperazine Edisylate]   . Levaquin [Levofloxacin] Hives  . Lyrica [Pregabalin]     hallucinations  . Ciprofloxacin Hives and Swelling  . Penicillins Itching  . Sulfa Antibiotics Itching   Past Medical History  Diagnosis Date  . Cancer     thyroid-1995  . Hiatal hernia   . GERD (gastroesophageal reflux disease)   . Arthritis     OA, RA  . Asthma   . Hyperlipidemia   . Hypertension   . Hypothyroidism   +++++++++++++++++++++++++++++++++++++++++++++++++++++++++++++++++++++++++++++++++ BP 104/62  Pulse 102  Temp 98.2 F   Resp 18  Ht 5\' 3"    Wt 182 lb   BMI 32.25   Objective:   Physical Exam  Constitutional: She is oriented to person, place, and time.  Over Nourished & in No acute distress   HENT:  Right Ear: External ear normal.  Left Ear:  External ear normal.  Nose: Nose normal.  Mouth/Throat: Oropharynx is clear and moist.  Oropharynx - moderately injected with sl exudate.  Eyes: Conjunctivae and EOM are normal. Pupils are equal, round, and reactive to light. Right eye exhibits no discharge. Left eye exhibits no discharge.  Neck: Normal range of motion. Neck supple. No JVD present. No tracheal deviation present.  Cardiovascular: Normal rate, regular rhythm and normal heart sounds.   No murmur heard. Pulmonary/Chest: Effort normal. No respiratory distress. She has wheezes. She has rales. She exhibits tenderness.  Abdominal: Soft.  Musculoskeletal: Normal range of motion.  Lymphadenopathy:    She has no cervical adenopathy.  Neurological: She is alert and oriented to person, place, and time. A cranial nerve deficit is present. Coordination normal.  Skin: Skin is warm. No rash noted. She is diaphoretic. No erythema. No pallor.   Assessment & Plan:   1. Acute tracheitis without mention of obstruction - Rx - Zpak x 3 rk - Prednisone 20 mg # 20 x 1 Rf - Tessalon Perles 100 mg 1-2 tid prn  2. Acute sinusitis, unspecified

## 2014-07-03 ENCOUNTER — Ambulatory Visit: Payer: Self-pay | Admitting: Physician Assistant

## 2014-07-05 ENCOUNTER — Ambulatory Visit (INDEPENDENT_AMBULATORY_CARE_PROVIDER_SITE_OTHER): Payer: Medicare Other | Admitting: Physician Assistant

## 2014-07-05 ENCOUNTER — Encounter: Payer: Self-pay | Admitting: Physician Assistant

## 2014-07-05 VITALS — BP 126/80 | HR 102 | Temp 98.2°F | Resp 18 | Ht 63.0 in | Wt 185.0 lb

## 2014-07-05 DIAGNOSIS — E559 Vitamin D deficiency, unspecified: Secondary | ICD-10-CM

## 2014-07-05 DIAGNOSIS — M069 Rheumatoid arthritis, unspecified: Secondary | ICD-10-CM

## 2014-07-05 DIAGNOSIS — R7303 Prediabetes: Secondary | ICD-10-CM

## 2014-07-05 DIAGNOSIS — Z79899 Other long term (current) drug therapy: Secondary | ICD-10-CM

## 2014-07-05 DIAGNOSIS — E782 Mixed hyperlipidemia: Secondary | ICD-10-CM

## 2014-07-05 DIAGNOSIS — M3500 Sicca syndrome, unspecified: Secondary | ICD-10-CM

## 2014-07-05 DIAGNOSIS — R3911 Hesitancy of micturition: Secondary | ICD-10-CM

## 2014-07-05 DIAGNOSIS — Z23 Encounter for immunization: Secondary | ICD-10-CM

## 2014-07-05 DIAGNOSIS — R5383 Other fatigue: Secondary | ICD-10-CM

## 2014-07-05 DIAGNOSIS — E039 Hypothyroidism, unspecified: Secondary | ICD-10-CM

## 2014-07-05 DIAGNOSIS — L732 Hidradenitis suppurativa: Secondary | ICD-10-CM

## 2014-07-05 DIAGNOSIS — I10 Essential (primary) hypertension: Secondary | ICD-10-CM

## 2014-07-05 LAB — CBC WITH DIFFERENTIAL/PLATELET
BASOS PCT: 0 % (ref 0–1)
Basophils Absolute: 0 10*3/uL (ref 0.0–0.1)
Eosinophils Absolute: 0.1 10*3/uL (ref 0.0–0.7)
Eosinophils Relative: 1 % (ref 0–5)
HEMATOCRIT: 43.4 % (ref 36.0–46.0)
HEMOGLOBIN: 14.7 g/dL (ref 12.0–15.0)
LYMPHS ABS: 4 10*3/uL (ref 0.7–4.0)
Lymphocytes Relative: 42 % (ref 12–46)
MCH: 29.6 pg (ref 26.0–34.0)
MCHC: 33.9 g/dL (ref 30.0–36.0)
MCV: 87.5 fL (ref 78.0–100.0)
MONO ABS: 0.6 10*3/uL (ref 0.1–1.0)
MONOS PCT: 6 % (ref 3–12)
NEUTROS ABS: 4.9 10*3/uL (ref 1.7–7.7)
Neutrophils Relative %: 51 % (ref 43–77)
Platelets: 385 10*3/uL (ref 150–400)
RBC: 4.96 MIL/uL (ref 3.87–5.11)
RDW: 14.7 % (ref 11.5–15.5)
WBC: 9.6 10*3/uL (ref 4.0–10.5)

## 2014-07-05 LAB — LIPID PANEL
Cholesterol: 237 mg/dL — ABNORMAL HIGH (ref 0–200)
HDL: 53 mg/dL (ref >39)
LDL CALC: 163 mg/dL — AB (ref 0–99)
TRIGLYCERIDES: 105 mg/dL (ref <150)
Total CHOL/HDL Ratio: 4.5 Ratio
VLDL: 21 mg/dL (ref 0–40)

## 2014-07-05 LAB — HEPATIC FUNCTION PANEL
ALT: 28 U/L (ref 0–35)
AST: 25 U/L (ref 0–37)
Albumin: 4.2 g/dL (ref 3.5–5.2)
Alkaline Phosphatase: 72 U/L (ref 39–117)
Bilirubin, Direct: <0.1 mg/dL (ref 0.0–0.3)
Total Bilirubin: 0.3 mg/dL (ref 0.2–1.2)
Total Protein: 6.9 g/dL (ref 6.0–8.3)

## 2014-07-05 LAB — URINALYSIS, MICROSCOPIC ONLY
Bacteria, UA: NONE SEEN
CASTS: NONE SEEN
CRYSTALS: NONE SEEN

## 2014-07-05 LAB — BASIC METABOLIC PANEL WITH GFR
BUN: 12 mg/dL (ref 6–23)
CO2: 25 mEq/L (ref 19–32)
Calcium: 9.8 mg/dL (ref 8.4–10.5)
Chloride: 105 mEq/L (ref 96–112)
Creat: 0.52 mg/dL (ref 0.50–1.10)
GFR, Est Non African American: >89 mL/min
Glucose, Bld: 85 mg/dL (ref 70–99)
Potassium: 4.3 mEq/L (ref 3.5–5.3)
SODIUM: 139 meq/L (ref 135–145)

## 2014-07-05 LAB — HEMOGLOBIN A1C
Hgb A1c MFr Bld: 5.6 % (ref <5.7)
Mean Plasma Glucose: 114 mg/dL (ref <117)

## 2014-07-05 LAB — IRON AND TIBC
%SAT: 26 % (ref 20–55)
IRON: 81 ug/dL (ref 42–145)
TIBC: 309 ug/dL (ref 250–470)
UIBC: 228 ug/dL (ref 125–400)

## 2014-07-05 LAB — URINALYSIS, ROUTINE W REFLEX MICROSCOPIC
BILIRUBIN URINE: NEGATIVE
Glucose, UA: NEGATIVE mg/dL
KETONES UR: NEGATIVE mg/dL
Leukocytes, UA: NEGATIVE
NITRITE: NEGATIVE
Protein, ur: NEGATIVE mg/dL
Specific Gravity, Urine: 1.007 (ref 1.005–1.030)
UROBILINOGEN UA: 0.2 mg/dL (ref 0.0–1.0)
pH: 6 (ref 5.0–8.0)

## 2014-07-05 LAB — VITAMIN B12: Vitamin B-12: 1386 pg/mL — ABNORMAL HIGH (ref 211–911)

## 2014-07-05 LAB — TSH: TSH: 0.148 u[IU]/mL — ABNORMAL LOW (ref 0.350–4.500)

## 2014-07-05 LAB — FERRITIN: FERRITIN: 69 ng/mL (ref 10–291)

## 2014-07-05 LAB — MAGNESIUM: MAGNESIUM: 1.6 mg/dL (ref 1.5–2.5)

## 2014-07-05 NOTE — Progress Notes (Signed)
Assessment and Plan:  Hypertension: Continue medication, monitor blood pressure at home. Continue DASH diet.  Reminder to go to the ER if any CP, SOB, nausea, dizziness, severe HA, changes vision/speech, left arm numbness and tingling, and jaw pain. Cholesterol: Continue diet and exercise. Check cholesterol.  Pre-diabetes-Continue diet and exercise. Check A1C Vitamin D Def- check level and continue medications.  Fatigue- ? From RA, will check UAC&S, CBC, TSH, B12, Iron Urinary hesitancy-? From UTI versus sjogren's.  Smoking cessation- discussed, continue to try chantix   Continue diet and meds as discussed. Further disposition pending results of labs.  HPI 53 y.o. female  presents for 3 month follow up with hypertension, hyperlipidemia, prediabetes and vitamin D. Her blood pressure has been controlled at home, today their BP is BP: 126/80 mmHg She does not workout due to chronic pain/RA pain in ankles/feet but she wants to start going to the [employer] again to the pool. She denies chest pain, shortness of breath, dizziness.  She is on cholesterol medication and denies myalgias. Her cholesterol is not at goal. The cholesterol last visit was:   Lab Results  Component Value Date   CHOL 271* 03/26/2014   HDL 54 03/26/2014   LDLCALC 186* 03/26/2014   TRIG 157* 03/26/2014   CHOLHDL 5.0 03/26/2014   She has been working on diet and exercise for prediabetes, and denies polyuria, visual disturbances, vomiting and weight loss. Last A1C in the office was:  Lab Results  Component Value Date   HGBA1C 5.6 03/26/2014   Patient is on Vitamin D supplement.   Lab Results  Component Value Date   VD25OH 64 03/26/2014     She follows with Dr. Ouida Sills for RA and has seen her Derm and has been diagnosed with Hidradenitis Supportiva and she is going to be started on Enbrel which will help.  She complains of chronic fatigue and pain, she would like her B12 checked.  She is on thyroid medication. Her medication was  not changed last visit. Patient denies nervousness, palpitations and weight changes.  Lab Results  Component Value Date   TSH 3.403 03/26/2014  .  She is still smoking but has decreased significantly and working very hard to quit, she has promised her grandkids and she has chantix at home that she would like to start.   Current Medications:  Current Outpatient Prescriptions on File Prior to Visit  Medication Sig Dispense Refill  . albuterol (PROVENTIL) (2.5 MG/3ML) 0.083% nebulizer solution Take 3 mLs (2.5 mg total) by nebulization every 6 (six) hours as needed for wheezing or shortness of breath.  60 vial  1  . amitriptyline (ELAVIL) 25 MG tablet Take 25 mg by mouth at bedtime. 1 po a.m. 3 po qhs      . atorvastatin (LIPITOR) 80 MG tablet Take 80 mg by mouth daily at 6 PM. 1/2 MWF      . azithromycin (AZASITE) 1 % ophthalmic solution 1 drop 2 (two) times daily.      . benzonatate (TESSALON) 100 MG capsule Take 1 to 2 perles 3 x day if needed for cough prevention  60 capsule  3  . buprenorphine (BUTRANS) 10 MCG/HR PTWK patch Place 10 mcg onto the skin once a week.      . Calcium Citrate-Vitamin D (CITRACAL + D PO) Take 1 tablet by mouth daily.       . Cholecalciferol (VITAMIN D3) 5000 UNITS TABS Take 5,000 mg by mouth daily.      . furosemide (  LASIX) 40 MG tablet TAKE 1 TABLET BY MOUTH THREE TIMES DAILY FOR FLUID RETENTION  90 tablet  PRN  . levothyroxine (SYNTHROID, LEVOTHROID) 125 MCG tablet Take 150 mcg by mouth daily.       . Magnesium Oxide 500 MG TABS Take 500 mg by mouth daily.      . Nutritional Supplements (CARDIO COMPLETE) CAPS Take 1 capsule by mouth.      Vladimir Faster Glycol-Propyl Glycol (SYSTANE) 0.4-0.3 % SOLN Apply 1 drop to eye 4 (four) times daily.      . predniSONE (DELTASONE) 20 MG tablet 1 tab 3 x day for 3 days, then 1 tab 2 x day for 3 days, then 1 tab 1 x day for 5 days  20 tablet  1  . PREVIDENT 5000 SENSITIVE 1.1-5 % PSTE       . PROAIR HFA 108 (90 BASE) MCG/ACT  inhaler Inhale 2 puffs into the lungs every 6 (six) hours as needed for wheezing or shortness of breath.       . CHANTIX 1 MG tablet TAKE 1 TABLET BY MOUTH TWICE DAILY  60 tablet  3   No current facility-administered medications on file prior to visit.   Medical History:  Past Medical History  Diagnosis Date  . Cancer     thyroid-1995  . Hiatal hernia   . GERD (gastroesophageal reflux disease)   . Arthritis     OA, RA  . Asthma   . Hyperlipidemia   . Hypertension   . Hypothyroidism    Allergies:  Allergies  Allergen Reactions  . Hydrocodone Itching  . Morphine And Related Itching  . Compazine [Prochlorperazine Edisylate]   . Levaquin [Levofloxacin] Hives  . Lyrica [Pregabalin]     hallucinations  . Ciprofloxacin Hives and Swelling  . Penicillins Itching  . Sulfa Antibiotics Itching     Review of Systems: [X]  = complains of  [ ]  = denies  General: Fatigue [x ] Fever [ ]  Chills [ ]  Weakness [ ]   Insomnia [ ]  Eyes: Redness [ ]  Blurred vision [ ]  Diplopia [ ]   ENT: Congestion [x ] Sinus Pain [ ]  Post Nasal Drip [ ]  Sore Throat [ ]  Earache [ ]   Cardiac: Chest pain/pressure [ ]  SOB [ ]  Orthopnea [ ]   Palpitations [ ]   Paroxysmal nocturnal dyspnea[ ]  Claudication [ ]  Edema [ ]   Pulmonary: Cough [ x] Wheezing[ ]   SOB [ ]   Snoring [ ]   GI: Nausea [ ]  Vomiting[ ]  Dysphagia[ ]  Heartburn[ ]  Abdominal pain [ ]  Constipation [ ] ; Diarrhea [ ] ; BRBPR [ ]  Melena[ ]  GU: Hematuria[ ]  Dysuria [x ] Nocturia[ ]  Urgency [ ]   Hesitancy [ x] Discharge [ ]  Neuro: Headaches[ ]  Vertigo[ ]  Paresthesias[ ]  Spasm [ ]  Speech changes [ ]  Incoordination [ ]   Ortho: Arthritis [x ] Joint pain [x ] Muscle pain [x ] Joint swelling [ ]  Back Pain [ ]  Skin:  Rash [ ]   Pruritis [ ]  Change in skin lesion [ ]   Psych: Depression[ ]  Anxiety[ ]  Confusion [ ]  Memory loss [ ]   Heme/Lypmh: Bleeding [ ]  Bruising [ ]  Enlarged lymph nodes [ ]   Endocrine: Visual blurring [ ]  Paresthesia [ ]  Polyuria [ ]  Polydypsea [ ]     Heat/cold intolerance [ ]  Hypoglycemia [ ]   Family history- Review and unchanged Social history- Review and unchanged Physical Exam: BP 126/80  Pulse 102  Temp(Src) 98.2 F (36.8 C) (Temporal)  Resp 18  Ht 5\' 3"  (1.6 m)  Wt 185 lb (83.915 kg)  BMI 32.78 kg/m2 Wt Readings from Last 3 Encounters:  07/05/14 185 lb (83.915 kg)  05/27/14 182 lb (82.555 kg)  03/26/14 182 lb 6.4 oz (82.736 kg)   General Appearance: Well nourished, in no apparent distress. Eyes: PERRLA, EOMs, conjunctiva no swelling or erythema Sinuses: + Frontal/maxillary tenderness ENT/Mouth: Ext aud canals clear, TMs without erythema, bulging. No erythema, swelling, or exudate on post pharynx.  Tonsils not swollen or erythematous. Hearing normal.  Neck: Supple, thyroid normal.  Respiratory: Respiratory effort normal, BS equal bilaterally without rales, rhonchi, wheezing or stridor.  Cardio: RRR with no MRGs. Brisk peripheral pulses without edema.  Abdomen: Soft, + BS.  Non tender, no guarding, rebound, hernias, masses. Lymphatics: Non tender without lymphadenopathy.  Musculoskeletal: Full ROM, 5/5 strength, normal gait, diffuse tenderness Skin: Warm, dry without rashes, lesions, ecchymosis.  Neuro: Cranial nerves intact. Normal muscle tone, no cerebellar symptoms. Sensation intact.  Psych: Awake and oriented X 3, normal affect, Insight and Judgment appropriate.    Vicie Mutters, PA-C 8:42 AM [facility]

## 2014-07-05 NOTE — Patient Instructions (Signed)
We are giving you chantix for smoking cessation. You can do it! And we are here to help! You may have heard some scary side effects about chantix, the three most common I hear about are nausea, crazy dreams and depression.  However, I like for my patients to try to stay on 1/2 a tablet twice a day rather than one tablet twice a day as normally prescribed. This helps decrease the chances of side effects and helps save money by making a one month prescription last two months  Please start the prescription this way:  Start 1/2 tablet by mouth once daily after food with a full glass of water for 3 days Then do 1/2 tablet by mouth twice daily for 4 days.  At this point we have several options: 1) continue on 1/2 tablet twice a day- which I encourage you to do. You can stay on this dose the rest of the time on the medication or if you still feel the need to smoke you can do one of the two options below. 2) do one tablet in the morning and 1/2 in the evening which helps decrease dreams. 3) do one tablet twice a day.   What if I miss a dose? If you miss a dose, take it as soon as you can. If it is almost time for your next dose, take only that dose. Do not take double or extra doses.  What should I watch for while using this medicine? Visit your doctor or health care professional for regular check ups. Ask for ongoing advice and encouragement from your doctor or healthcare professional, friends, and family to help you quit. If you smoke while on this medication, quit again  Your mouth may get dry. Chewing sugarless gum or hard candy, and drinking plenty of water may help. Contact your doctor if the problem does not go away or is severe.  You may get drowsy or dizzy. Do not drive, use machinery, or do anything that needs mental alertness until you know how this medicine affects you. Do not stand or sit up quickly, especially if you are an older patient.   The use of this medicine may increase the chance  of suicidal thoughts or actions. Pay special attention to how you are responding while on this medicine. Any worsening of mood, or thoughts of suicide or dying should be reported to your health care professional right away.  ADVANTAGES OF QUITTING SMOKING  Within 20 minutes, blood pressure decreases. Your pulse is at normal level.  After 8 hours, carbon monoxide levels in the blood return to normal. Your oxygen level increases.  After 24 hours, the chance of having a heart attack starts to decrease. Your breath, hair, and body stop smelling like smoke.  After 48 hours, damaged nerve endings begin to recover. Your sense of taste and smell improve.  After 72 hours, the body is virtually free of nicotine. Your bronchial tubes relax and breathing becomes easier.  After 2 to 12 weeks, lungs can hold more air. Exercise becomes easier and circulation improves.  After 1 year, the risk of coronary heart disease is cut in half.  After 5 years, the risk of stroke falls to the same as a nonsmoker.  After 10 years, the risk of lung cancer is cut in half and the risk of other cancers decreases significantly.  After 15 years, the risk of coronary heart disease drops, usually to the level of a nonsmoker.  You will have extra money   to spend on things other than cigarettes.

## 2014-07-06 LAB — VITAMIN D 25 HYDROXY (VIT D DEFICIENCY, FRACTURES): VIT D 25 HYDROXY: 65 ng/mL (ref 30–89)

## 2014-07-09 LAB — URINE CULTURE: Colony Count: >=100000

## 2014-07-10 MED ORDER — NITROFURANTOIN MONOHYD MACRO 100 MG PO CAPS: 100.0000 mg | ORAL_CAPSULE | Freq: Two times a day (BID) | ORAL | Status: AC

## 2014-07-10 NOTE — Addendum Note (Signed)
Addended by: Vicie Mutters R on: 07/10/2014 11:11 AM   Modules accepted: Orders

## 2014-08-05 ENCOUNTER — Other Ambulatory Visit: Payer: Self-pay

## 2014-08-05 MED ORDER — ESTRADIOL ACETATE 0.1 MG/24HR VA RING: VAGINAL_RING | VAGINAL | Status: DC

## 2014-08-13 ENCOUNTER — Ambulatory Visit (INDEPENDENT_AMBULATORY_CARE_PROVIDER_SITE_OTHER): Payer: Medicare Other | Admitting: *Deleted

## 2014-08-13 DIAGNOSIS — R319 Hematuria, unspecified: Secondary | ICD-10-CM

## 2014-08-13 DIAGNOSIS — N390 Urinary tract infection, site not specified: Secondary | ICD-10-CM

## 2014-08-13 DIAGNOSIS — Z720 Tobacco use: Secondary | ICD-10-CM

## 2014-08-13 DIAGNOSIS — F17200 Nicotine dependence, unspecified, uncomplicated: Secondary | ICD-10-CM

## 2014-08-13 LAB — URINALYSIS, MICROSCOPIC ONLY
Bacteria, UA: NONE SEEN
CRYSTALS: NONE SEEN
Casts: NONE SEEN
SQUAMOUS EPITHELIAL / LPF: NONE SEEN

## 2014-08-13 LAB — URINALYSIS, ROUTINE W REFLEX MICROSCOPIC
Bilirubin Urine: NEGATIVE
GLUCOSE, UA: NEGATIVE mg/dL
Ketones, ur: NEGATIVE mg/dL
LEUKOCYTES UA: NEGATIVE
Nitrite: NEGATIVE
Protein, ur: NEGATIVE mg/dL
SPECIFIC GRAVITY, URINE: 1.011 (ref 1.005–1.030)
Urobilinogen, UA: 0.2 mg/dL (ref 0.0–1.0)
pH: 5.5 (ref 5.0–8.0)

## 2014-08-13 NOTE — Progress Notes (Signed)
Patient ID: Autumn Frye, female   DOB: [birth year], 53 y.o.   MRN: [id number] Patient presents for 1 month recheck UA, C&S.  Patient completed abx as directed but is noticing some current UTI symptoms of increased urgency and dysuria.

## 2014-08-14 LAB — URINE CULTURE
Colony Count: NO GROWTH
Organism ID, Bacteria: NO GROWTH

## 2014-08-15 NOTE — Addendum Note (Signed)
Addended by: Vladimir Crofts on: 08/15/2014 08:48 AM   Modules accepted: Orders

## 2014-09-24 ENCOUNTER — Encounter: Payer: Self-pay | Admitting: Internal Medicine

## 2014-09-30 ENCOUNTER — Ambulatory Visit: Payer: Self-pay | Admitting: Physician Assistant

## 2014-10-01 DIAGNOSIS — R3913 Splitting of urinary stream: Secondary | ICD-10-CM | POA: Diagnosis not present

## 2014-10-01 DIAGNOSIS — R312 Other microscopic hematuria: Secondary | ICD-10-CM | POA: Diagnosis not present

## 2014-10-02 DIAGNOSIS — H04123 Dry eye syndrome of bilateral lacrimal glands: Secondary | ICD-10-CM | POA: Diagnosis not present

## 2014-10-03 ENCOUNTER — Ambulatory Visit (INDEPENDENT_AMBULATORY_CARE_PROVIDER_SITE_OTHER): Payer: Medicare Other | Admitting: Internal Medicine

## 2014-10-03 ENCOUNTER — Encounter: Payer: Self-pay | Admitting: Internal Medicine

## 2014-10-03 VITALS — BP 134/84 | HR 80 | Temp 97.7°F | Resp 16 | Ht 63.0 in | Wt 193.8 lb

## 2014-10-03 DIAGNOSIS — E039 Hypothyroidism, unspecified: Secondary | ICD-10-CM

## 2014-10-03 DIAGNOSIS — M797 Fibromyalgia: Secondary | ICD-10-CM | POA: Diagnosis not present

## 2014-10-03 DIAGNOSIS — Z113 Encounter for screening for infections with a predominantly sexual mode of transmission: Secondary | ICD-10-CM | POA: Diagnosis not present

## 2014-10-03 DIAGNOSIS — Z0000 Encounter for general adult medical examination without abnormal findings: Secondary | ICD-10-CM

## 2014-10-03 DIAGNOSIS — E559 Vitamin D deficiency, unspecified: Secondary | ICD-10-CM

## 2014-10-03 DIAGNOSIS — R7309 Other abnormal glucose: Secondary | ICD-10-CM | POA: Diagnosis not present

## 2014-10-03 DIAGNOSIS — Z0001 Encounter for general adult medical examination with abnormal findings: Secondary | ICD-10-CM

## 2014-10-03 DIAGNOSIS — R6889 Other general symptoms and signs: Secondary | ICD-10-CM | POA: Diagnosis not present

## 2014-10-03 DIAGNOSIS — R7989 Other specified abnormal findings of blood chemistry: Secondary | ICD-10-CM | POA: Diagnosis not present

## 2014-10-03 DIAGNOSIS — I10 Essential (primary) hypertension: Secondary | ICD-10-CM | POA: Diagnosis not present

## 2014-10-03 DIAGNOSIS — E782 Mixed hyperlipidemia: Secondary | ICD-10-CM | POA: Diagnosis not present

## 2014-10-03 DIAGNOSIS — R5383 Other fatigue: Secondary | ICD-10-CM

## 2014-10-03 DIAGNOSIS — J45909 Unspecified asthma, uncomplicated: Secondary | ICD-10-CM

## 2014-10-03 DIAGNOSIS — Z1331 Encounter for screening for depression: Secondary | ICD-10-CM

## 2014-10-03 DIAGNOSIS — M069 Rheumatoid arthritis, unspecified: Secondary | ICD-10-CM

## 2014-10-03 DIAGNOSIS — Z1212 Encounter for screening for malignant neoplasm of rectum: Secondary | ICD-10-CM

## 2014-10-03 DIAGNOSIS — Z9181 History of falling: Secondary | ICD-10-CM

## 2014-10-03 DIAGNOSIS — R7303 Prediabetes: Secondary | ICD-10-CM

## 2014-10-03 DIAGNOSIS — Z79899 Other long term (current) drug therapy: Secondary | ICD-10-CM | POA: Diagnosis not present

## 2014-10-03 DIAGNOSIS — M3500 Sicca syndrome, unspecified: Secondary | ICD-10-CM

## 2014-10-03 DIAGNOSIS — M0609 Rheumatoid arthritis without rheumatoid factor, multiple sites: Secondary | ICD-10-CM | POA: Diagnosis not present

## 2014-10-03 DIAGNOSIS — R945 Abnormal results of liver function studies: Secondary | ICD-10-CM

## 2014-10-03 NOTE — Patient Instructions (Signed)
Recommend the book "The END of DIETING" by Dr Excell Seltzer   & the book "The END of DIABETES " by Dr Excell Seltzer  At [email or web address] - get book & Audio CD's      Being diabetic has a  300% increased risk for heart attack, stroke, cancer, and alzheimer- type vascular dementia. It is very important that you work harder with diet by avoiding all foods that are white except chicken & fish. Avoid white rice (brown & wild rice is OK), white potatoes (sweetpotatoes in moderation is OK), White bread or wheat bread or anything made out of white flour like bagels, donuts, rolls, buns, biscuits, cakes, pastries, cookies, pizza crust, and pasta (made from white flour & egg whites) - vegetarian pasta or spinach or wheat pasta is OK. Multigrain breads like Arnold's or [employer], or multigrain sandwich thins or flatbreads.  Diet, exercise and weight loss can reverse and cure diabetes in the early stages.  Diet, exercise and weight loss is very important in the control and prevention of complications of diabetes which affects every system in your body, ie. Brain - dementia/stroke, eyes - glaucoma/blindness, heart - heart attack/heart failure, kidneys - dialysis, stomach - gastric paralysis, intestines - malabsorption, nerves - severe painful neuritis, circulation - gangrene & loss of a leg(s), and finally cancer and Alzheimers.    I recommend avoid fried & greasy foods,  sweets/candy, white rice (brown or wild rice or Quinoa is OK), white potatoes (sweet potatoes are OK) - anything made from white flour - bagels, doughnuts, rolls, buns, biscuits,white and wheat breads, pizza crust and traditional pasta made of white flour & egg white(vegetarian pasta or spinach or wheat pasta is OK).  Multi-grain bread is OK - like multi-grain flat bread or sandwich thins. Avoid alcohol in excess. Exercise is also important.    Eat all the vegetables you want - avoid meat, especially red meat and dairy - especially cheese.  Cheese  is the most concentrated form of trans-fats which is the worst thing to clog up our arteries. Veggie cheese is OK which can be found in the fresh produce section at [employer] or [employer] or [employer]  Preventive Care for Adults A healthy lifestyle and preventive care can promote health and wellness. Preventive health guidelines for women include the following key practices.  A routine yearly physical is a good way to check with your health care provider about your health and preventive screening. It is a chance to share any concerns and updates on your health and to receive a thorough exam.  Visit your dentist for a routine exam and preventive care every 6 months. Brush your teeth twice a day and floss once a day. Good oral hygiene prevents tooth decay and gum disease.  The frequency of eye exams is based on your age, health, family medical history, use of contact lenses, and other factors. Follow your health care provider's recommendations for frequency of eye exams.  Eat a healthy diet. Foods like vegetables, fruits, whole grains, low-fat dairy products, and lean protein foods contain the nutrients you need without too many calories. Decrease your intake of foods high in solid fats, added sugars, and salt. Eat the right amount of calories for you.Get information about a proper diet from your health care provider, if necessary.  Regular physical exercise is one of the most important things you can do for your health. Most adults should get at least 150 minutes of moderate-intensity exercise (any activity that increases  your heart rate and causes you to sweat) each week. In addition, most adults need muscle-strengthening exercises on 2 or more days a week.  Maintain a healthy weight. The body mass index (BMI) is a screening tool to identify possible weight problems. It provides an estimate of body fat based on height and weight. Your health care provider can find your BMI and can help you  achieve or maintain a healthy weight.For adults 20 years and older:  A BMI below 18.5 is considered underweight.  A BMI of 18.5 to 24.9 is normal.  A BMI of 25 to 29.9 is considered overweight.  A BMI of 30 and above is considered obese.  Maintain normal blood lipids and cholesterol levels by exercising and minimizing your intake of saturated fat. Eat a balanced diet with plenty of fruit and vegetables. Blood tests for lipids and cholesterol should begin at age 38 and be repeated every 5 years. If your lipid or cholesterol levels are high, you are over 50, or you are at high risk for heart disease, you may need your cholesterol levels checked more frequently.Ongoing high lipid and cholesterol levels should be treated with medicines if diet and exercise are not working.  If you smoke, find out from your health care provider how to quit. If you do not use tobacco, do not start.  Lung cancer screening is recommended for adults aged 64-80 years who are at high risk for developing lung cancer because of a history of smoking. A yearly low-dose CT scan of the lungs is recommended for people who have at least a 30-pack-year history of smoking and are a current smoker or have quit within the past 15 years. A pack year of smoking is smoking an average of 1 pack of cigarettes a day for 1 year (for example: 1 pack a day for 30 years or 2 packs a day for 15 years). Yearly screening should continue until the smoker has stopped smoking for at least 15 years. Yearly screening should be stopped for people who develop a health problem that would prevent them from having lung cancer treatment.  High blood pressure causes heart disease and increases the risk of stroke. Your blood pressure should be checked at least every 1 to 2 years. Ongoing high blood pressure should be treated with medicines if weight loss and exercise do not work.  If you are 79-57 years old, ask your health care provider if you should take  aspirin to prevent strokes.  Diabetes screening involves taking a blood sample to check your fasting blood sugar level. This should be done once every 3 years, after age 37, if you are within normal weight and without risk factors for diabetes. Testing should be considered at a younger age or be carried out more frequently if you are overweight and have at least 1 risk factor for diabetes.  Breast cancer screening is essential preventive care for women. You should practice "breast self-awareness." This means understanding the normal appearance and feel of your breasts and may include breast self-examination. Any changes detected, no matter how small, should be reported to a health care provider. Women in their 76s and 30s should have a clinical breast exam (CBE) by a health care provider as part of a regular health exam every 1 to 3 years. After age 55, women should have a CBE every year. Starting at age 79, women should consider having a mammogram (breast X-ray test) every year. Women who have a family history of breast  cancer should talk to their health care provider about genetic screening. Women at a high risk of breast cancer should talk to their health care providers about having an MRI and a mammogram every year.  Breast cancer gene (BRCA)-related cancer risk assessment is recommended for women who have family members with BRCA-related cancers. BRCA-related cancers include breast, ovarian, tubal, and peritoneal cancers. Having family members with these cancers may be associated with an increased risk for harmful changes (mutations) in the breast cancer genes BRCA1 and BRCA2. Results of the assessment will determine the need for genetic counseling and BRCA1 and BRCA2 testing.  Routine pelvic exams to screen for cancer are no longer recommended for nonpregnant women who are considered low risk for cancer of the pelvic organs (ovaries, uterus, and vagina) and who do not have symptoms. Ask your health  care provider if a screening pelvic exam is right for you.  If you have had past treatment for cervical cancer or a condition that could lead to cancer, you need Pap tests and screening for cancer for at least 20 years after your treatment. If Pap tests have been discontinued, your risk factors (such as having a new sexual partner) need to be reassessed to determine if screening should be resumed. Some women have medical problems that increase the chance of getting cervical cancer. In these cases, your health care provider may recommend more frequent screening and Pap tests.  Colorectal cancer can be detected and often prevented. Most routine colorectal cancer screening begins at the age of 58 years and continues through age 44 years. However, your health care provider may recommend screening at an earlier age if you have risk factors for colon cancer. On a yearly basis, your health care provider may provide home test kits to check for hidden blood in the stool. Use of a small camera at the end of a tube, to directly examine the colon (sigmoidoscopy or colonoscopy), can detect the earliest forms of colorectal cancer. Talk to your health care provider about this at age 84, when routine screening begins. Direct exam of the colon should be repeated every 5-10 years through age 33 years, unless early forms of pre-cancerous polyps or small growths are found.  Hepatitis C blood testing is recommended for all people born from [birth year] through 1965 and any individual with known risks for hepatitis C.  Pra  Osteoporosis is a disease in which the bones lose minerals and strength with aging. This can result in serious bone fractures or breaks. The risk of osteoporosis can be identified using a bone density scan. Women ages 36 years and over and women at risk for fractures or osteoporosis should discuss screening with their health care providers. Ask your health care provider whether you should take a calcium supplement  or vitamin D to reduce the rate of osteoporosis.  Menopause can be associated with physical symptoms and risks. Hormone replacement therapy is available to decrease symptoms and risks. You should talk to your health care provider about whether hormone replacement therapy is right for you.  Use sunscreen. Apply sunscreen liberally and repeatedly throughout the day. You should seek shade when your shadow is shorter than you. Protect yourself by wearing long sleeves, pants, a wide-brimmed hat, and sunglasses year round, whenever you are outdoors.  Once a month, do a whole body skin exam, using a mirror to look at the skin on your back. Tell your health care provider of new moles, moles that have irregular borders, moles that are  larger than a pencil eraser, or moles that have changed in shape or color.  Stay current with required vaccines (immunizations).  Influenza vaccine. All adults should be immunized every year.  Tetanus, diphtheria, and acellular pertussis (Td, Tdap) vaccine. Pregnant women should receive 1 dose of Tdap vaccine during each pregnancy. The dose should be obtained regardless of the length of time since the last dose. Immunization is preferred during the 27th-36th week of gestation. An adult who has not previously received Tdap or who does not know her vaccine status should receive 1 dose of Tdap. This initial dose should be followed by tetanus and diphtheria toxoids (Td) booster doses every 10 years. Adults with an unknown or incomplete history of completing a 3-dose immunization series with Td-containing vaccines should begin or complete a primary immunization series including a Tdap dose. Adults should receive a Td booster every 10 years.  Varicella vaccine. An adult without evidence of immunity to varicella should receive 2 doses or a second dose if she has previously received 1 dose. Pregnant females who do not have evidence of immunity should receive the first dose after  pregnancy. This first dose should be obtained before leaving the health care facility. The second dose should be obtained 4-8 weeks after the first dose.  Human papillomavirus (HPV) vaccine. Females aged 13-26 years who have not received the vaccine previously should obtain the 3-dose series. The vaccine is not recommended for use in pregnant females. However, pregnancy testing is not needed before receiving a dose. If a female is found to be pregnant after receiving a dose, no treatment is needed. In that case, the remaining doses should be delayed until after the pregnancy. Immunization is recommended for any person with an immunocompromised condition through the age of 26 years if she did not get any or all doses earlier. During the 3-dose series, the second dose should be obtained 4-8 weeks after the first dose. The third dose should be obtained 24 weeks after the first dose and 16 weeks after the second dose.  Zoster vaccine. One dose is recommended for adults aged 60 years or older unless certain conditions are present.  Measles, mumps, and rubella (MMR) vaccine. Adults born before 1957 generally are considered immune to measles and mumps. Adults born in 1957 or later should have 1 or more doses of MMR vaccine unless there is a contraindication to the vaccine or there is laboratory evidence of immunity to each of the three diseases. A routine second dose of MMR vaccine should be obtained at least 28 days after the first dose for students attending postsecondary schools, health care workers, or international travelers. People who received inactivated measles vaccine or an unknown type of measles vaccine during 1963-1967 should receive 2 doses of MMR vaccine. People who received inactivated mumps vaccine or an unknown type of mumps vaccine before 1979 and are at high risk for mumps infection should consider immunization with 2 doses of MMR vaccine. For females of childbearing age, rubella immunity should  be determined. If there is no evidence of immunity, females who are not pregnant should be vaccinated. If there is no evidence of immunity, females who are pregnant should delay immunization until after pregnancy. Unvaccinated health care workers born before 1957 who lack laboratory evidence of measles, mumps, or rubella immunity or laboratory confirmation of disease should consider measles and mumps immunization with 2 doses of MMR vaccine or rubella immunization with 1 dose of MMR vaccine.  Pneumococcal 13-valent conjugate (PCV13) vaccine. When   indicated, a person who is uncertain of her immunization history and has no record of immunization should receive the PCV13 vaccine. An adult aged 73 years or older who has certain medical conditions and has not been previously immunized should receive 1 dose of PCV13 vaccine. This PCV13 should be followed with a dose of pneumococcal polysaccharide (PPSV23) vaccine. The PPSV23 vaccine dose should be obtained at least 8 weeks after the dose of PCV13 vaccine. An adult aged 81 years or older who has certain medical conditions and previously received 1 or more doses of PPSV23 vaccine should receive 1 dose of PCV13. The PCV13 vaccine dose should be obtained 1 or more years after the last PPSV23 vaccine dose.    Pneumococcal polysaccharide (PPSV23) vaccine. When PCV13 is also indicated, PCV13 should be obtained first. All adults aged 69 years and older should be immunized. An adult younger than age 35 years who has certain medical conditions should be immunized. Any person who resides in a nursing home or long-term care facility should be immunized. An adult smoker should be immunized. People with an immunocompromised condition and certain other conditions should receive both PCV13 and PPSV23 vaccines. People with human immunodeficiency virus (HIV) infection should be immunized as soon as possible after diagnosis. Immunization during chemotherapy or radiation therapy should  be avoided. Routine use of PPSV23 vaccine is not recommended for American Indians, [place]atives, or people younger than 65 years unless there are medical conditions that require PPSV23 vaccine. When indicated, people who have unknown immunization and have no record of immunization should receive PPSV23 vaccine. One-time revaccination 5 years after the first dose of PPSV23 is recommended for people aged 19-64 years who have chronic kidney failure, nephrotic syndrome, asplenia, or immunocompromised conditions. People who received 1-2 doses of PPSV23 before age 79 years should receive another dose of PPSV23 vaccine at age 73 years or later if at least 5 years have passed since the previous dose. Doses of PPSV23 are not needed for people immunized with PPSV23 at or after age 19 years.  Preventive Services / Frequency   Ages 25 to 65 years  Blood pressure check.  Lipid and cholesterol check.  Lung cancer screening. / Every year if you are aged 8-80 years and have a 30-pack-year history of smoking and currently smoke or have quit within the past 15 years. Yearly screening is stopped once you have quit smoking for at least 15 years or develop a health problem that would prevent you from having lung cancer treatment.  Clinical breast exam.** / Every year after age 28 years.  BRCA-related cancer risk assessment.** / For women who have family members with a BRCA-related cancer (breast, ovarian, tubal, or peritoneal cancers).  Mammogram.** / Every year beginning at age 57 years and continuing for as long as you are in good health. Consult with your health care provider.  Pap test.** / Every 3 years starting at age 57 years through age 25 or 13 years with a history of 3 consecutive normal Pap tests.  HPV screening.** / Every 3 years from ages 21 years through ages 60 to 48 years with a history of 3 consecutive normal Pap tests.  Fecal occult blood test (FOBT) of stool. / Every year beginning at age 77  years and continuing until age 57 years. You may not need to do this test if you get a colonoscopy every 10 years.  Flexible sigmoidoscopy or colonoscopy.** / Every 5 years for a flexible sigmoidoscopy or every 10 years  for a colonoscopy beginning at age 104 years and continuing until age 39 years.  Hepatitis C blood test.** / For all people born from [birth year] through 1965 and any individual with known risks for hepatitis C.  Skin self-exam. / Monthly.  Influenza vaccine. / Every year.  Tetanus, diphtheria, and acellular pertussis (Tdap/Td) vaccine.** / Consult your health care provider. Pregnant women should receive 1 dose of Tdap vaccine during each pregnancy. 1 dose of Td every 10 years.  Varicella vaccine.** / Consult your health care provider. Pregnant females who do not have evidence of immunity should receive the first dose after pregnancy.  Zoster vaccine.** / 1 dose for adults aged 47 years or older.  Pneumococcal 13-valent conjugate (PCV13) vaccine.** / Consult your health care provider.  Pneumococcal polysaccharide (PPSV23) vaccine.** / 1 to 2 doses if you smoke cigarettes or if you have certain conditions.  Meningococcal vaccine.** / Consult your health care provider.  Hepatitis A vaccine.** / Consult your health care provider.  Hepatitis B vaccine.** / Consult your health care provider. Screening for abdominal aortic aneurysm (AAA)  by ultrasound is recommended for people over 50 who have history of high blood pressure or who are current or former smokers.

## 2014-10-04 ENCOUNTER — Other Ambulatory Visit: Payer: Self-pay | Admitting: Internal Medicine

## 2014-10-04 LAB — HEPATITIS B SURFACE ANTIBODY,QUALITATIVE: Hep B S Ab: NEGATIVE

## 2014-10-04 LAB — BASIC METABOLIC PANEL WITH GFR
BUN: 11 mg/dL (ref 6–23)
CALCIUM: 9.7 mg/dL (ref 8.4–10.5)
CHLORIDE: 103 meq/L (ref 96–112)
CO2: 28 meq/L (ref 19–32)
Creat: 0.62 mg/dL (ref 0.50–1.10)
GFR, Est African American: >89 mL/min
GFR, Est Non African American: >89 mL/min
Glucose, Bld: 82 mg/dL (ref 70–99)
Potassium: 4.5 mEq/L (ref 3.5–5.3)
SODIUM: 137 meq/L (ref 135–145)

## 2014-10-04 LAB — HEPATIC FUNCTION PANEL
ALK PHOS: 69 U/L (ref 39–117)
ALT: 27 U/L (ref 0–35)
AST: 25 U/L (ref 0–37)
Albumin: 4.3 g/dL (ref 3.5–5.2)
Bilirubin, Direct: <0.1 mg/dL (ref 0.0–0.3)
Total Bilirubin: 0.3 mg/dL (ref 0.2–1.2)
Total Protein: 7 g/dL (ref 6.0–8.3)

## 2014-10-04 LAB — CBC WITH DIFFERENTIAL/PLATELET
BASOS ABS: 0 10*3/uL (ref 0.0–0.1)
Basophils Relative: 0 % (ref 0–1)
Eosinophils Absolute: 0.1 10*3/uL (ref 0.0–0.7)
Eosinophils Relative: 1 % (ref 0–5)
HEMATOCRIT: 44.5 % (ref 36.0–46.0)
HEMOGLOBIN: 14.7 g/dL (ref 12.0–15.0)
Lymphocytes Relative: 47 % — ABNORMAL HIGH (ref 12–46)
Lymphs Abs: 4.2 10*3/uL — ABNORMAL HIGH (ref 0.7–4.0)
MCH: 29.5 pg (ref 26.0–34.0)
MCHC: 33 g/dL (ref 30.0–36.0)
MCV: 89.4 fL (ref 78.0–100.0)
MONO ABS: 0.4 10*3/uL (ref 0.1–1.0)
MPV: 10.1 fL (ref 8.6–12.4)
Monocytes Relative: 5 % (ref 3–12)
NEUTROS PCT: 47 % (ref 43–77)
Neutro Abs: 4.2 10*3/uL (ref 1.7–7.7)
PLATELETS: 294 10*3/uL (ref 150–400)
RBC: 4.98 MIL/uL (ref 3.87–5.11)
RDW: 15 % (ref 11.5–15.5)
WBC: 8.9 10*3/uL (ref 4.0–10.5)

## 2014-10-04 LAB — IRON AND TIBC
%SAT: 32 % (ref 20–55)
Iron: 94 ug/dL (ref 42–145)
TIBC: 297 ug/dL (ref 250–470)
UIBC: 203 ug/dL (ref 125–400)

## 2014-10-04 LAB — MICROALBUMIN / CREATININE URINE RATIO
Creatinine, Urine: 78 mg/dL
MICROALB UR: 12.2 mg/dL — AB (ref <2.0)
Microalb Creat Ratio: 156.4 mg/g — ABNORMAL HIGH (ref 0.0–30.0)

## 2014-10-04 LAB — VITAMIN D 25 HYDROXY (VIT D DEFICIENCY, FRACTURES): Vit D, 25-Hydroxy: 34 ng/mL (ref 30–100)

## 2014-10-04 LAB — LIPID PANEL
CHOL/HDL RATIO: 4.4 ratio
Cholesterol: 292 mg/dL — ABNORMAL HIGH (ref 0–200)
HDL: 66 mg/dL (ref >39)
LDL Cholesterol: 190 mg/dL — ABNORMAL HIGH (ref 0–99)
Triglycerides: 179 mg/dL — ABNORMAL HIGH (ref <150)
VLDL: 36 mg/dL (ref 0–40)

## 2014-10-04 LAB — URINALYSIS, MICROSCOPIC ONLY
BACTERIA UA: NONE SEEN
CASTS: NONE SEEN
Crystals: NONE SEEN
Squamous Epithelial / LPF: NONE SEEN

## 2014-10-04 LAB — HEMOGLOBIN A1C
Hgb A1c MFr Bld: 5.6 % (ref <5.7)
Mean Plasma Glucose: 114 mg/dL (ref <117)

## 2014-10-04 LAB — HEPATITIS B CORE ANTIBODY, TOTAL: HEP B C TOTAL AB: NONREACTIVE

## 2014-10-04 LAB — MAGNESIUM: Magnesium: 1.9 mg/dL (ref 1.5–2.5)

## 2014-10-04 LAB — RPR

## 2014-10-04 LAB — INSULIN, FASTING: INSULIN FASTING, SERUM: 7 u[IU]/mL (ref 2.0–19.6)

## 2014-10-04 LAB — HIV ANTIBODY (ROUTINE TESTING W REFLEX): HIV 1&2 Ab, 4th Generation: NONREACTIVE

## 2014-10-04 LAB — HEPATITIS A ANTIBODY, TOTAL: HEP A TOTAL AB: REACTIVE — AB

## 2014-10-04 LAB — HEPATITIS C ANTIBODY: HCV Ab: NEGATIVE

## 2014-10-04 LAB — TSH: TSH: 10.431 u[IU]/mL — ABNORMAL HIGH (ref 0.350–4.500)

## 2014-10-06 NOTE — Progress Notes (Signed)
Patient ID: Autumn Frye, female   DOB: 01-22-1961, 54 y.o.   MRN: [id number]  [facility] VISIT AND CPE  Assessment:   1. Essential hypertension  - Microalbumin / creatinine urine ratio - EKG 12-Lead - [place], RETROPERITNL ABD,  LTD  2. Mixed hyperlipidemia  - Lipid panel  3. Prediabetes  - Hemoglobin A1c - Insulin, fasting  4. Vitamin D deficiency  - Vit D  25 hydroxy (rtn osteoporosis monitoring)  5. Hypothyroidism, unspecified hypothyroidism type   6. Sjogren's disease   7. Rheumatoid arthritis   8. Intrinsic asthma, unspecified asthma severity, uncomplicated   9. Screening for rectal cancer  - POC Hemoccult Bld/Stl (3-Cd Home Screen); Future  10. Depression screen   11. At low risk for fall   12. VD (venereal disease) screening  - HIV antibody - RPR  13. Abnormal LFTs  - Hepatitis A antibody, total - Hepatitis B core antibody, total - Hepatitis B e antibody - Hepatitis B surface antibody - Hepatitis C antibody  14. Other fatigue  - Iron and TIBC - TSH  15. Encounter for long-term (current) use of medications  - Urine Microscopic - CBC with Differential/Platelet - BASIC METABOLIC PANEL WITH GFR - Hepatic function panel - Magnesium  16. Routine general medical examination at a health care facility  Plan:   During the course of the visit the patient was educated and counseled about appropriate screening and preventive services including:    Pneumococcal vaccine   Influenza vaccine  Td vaccine  Screening electrocardiogram  Bone densitometry screening  Colorectal cancer screening  Diabetes screening  Glaucoma screening  Nutrition counseling   Advanced directives: requested  Screening recommendations, referrals: Vaccinations: Tdap vaccine 09/15/2012 Influenza vaccine 05/2014 Pneumococcal vaccine 09/15/2012 Prevnar vaccine 07/05/2014 Shingles vaccine 09/03/2013 Hep B vaccine not indicated  Nutrition  assessed and recommended  Colonoscopy 10/13/2011 Recommended yearly ophthalmology/optometry visit for glaucoma screening and checkup Recommended yearly dental visit for hygiene and checkup Advanced directives - no - offered forms  Conditions/risks identified: BMI: Discussed weight loss, diet, and increase physical activity.  Increase physical activity: AHA recommends 150 minutes of physical activity a week.  Medications reviewed PreDiabetes is at goal, ACE/ARB therapy: No Indicated Urinary Incontinence is not an issue: discussed non pharmacology and pharmacology options.  Fall risk: low- discussed PT, home fall assessment, medications.   Subjective:   Autumn Frye is a 54 y.o. female who presents for her 1st  Medicare Annual Wellness Visit and complete physical.  She has had elevated blood pressure since 2000. Her blood pressure has been controlled at home, & today their BP is BP: 134/84 mmHg   She was dx'd in 2009 with Rheumatoid Arthritis - treated with DMARD's and is followed very closely by Dr Tobie Lords.   She does not workout. She denies chest pain, shortness of breath, dizziness.   She is sporadically  cholesterol medication and denies myalgias. Her cholesterol is not at goal. The cholesterol last visit was:  Lab Results  Component Value Date   CHOL 292* 10/03/2014   HDL 66 10/03/2014   LDLCALC 190* 10/03/2014   TRIG 179* 10/03/2014   CHOLHDL 4.4 10/03/2014    She has had Prediabetes for 5 years (2010 with A1c 5.9% in Dec 2010). She alleges that she  been working on diet and exercise for prediabetes and denies foot ulcerations, hyperglycemia, hypoglycemia , paresthesia of the feet, polydipsia, polyuria, visual disturbances and weight loss. Last A1C in the office was:  Lab  Results  Component Value Date   HGBA1C 5.6 10/03/2014   Patient is on Vitamin D supplement.  (Vit D 27 in 2009) Lab Results  Component Value Date   VD25OH 34 10/03/2014      Names of Other  Physician/Practitioners you currently use: 1.  [facility] here for primary care 2. Dr Sherral Hammers, eye doctor, last visit Oct 02, 2014 3. Dr Raiford Noble, dentist, every 6 months  Patient Care Team: Unk Pinto, MD as PCP - General (Internal Medicine) Ladene Artist, MD as Consulting Physician (Gastroenterology) Newt Minion, MD as Consulting Physician (Orthopedic Surgery) Thyra Breed, MD as Consulting Physician (Rheumatology) Faythe Ghee, MD as Consulting Physician (Neurosurgery) Simona Huh, MD as Consulting Physician (Dermatology) Simona Huh, MD as Consulting Physician (Dermatology)  Medication Review: Medication Sig  . albuterol (PROVENTIL) (2.5 MG/3ML) 0.083% nebulizer solution Take 3 mLs (2.5 mg total) by nebulization every 6 (six) hours as needed for wheezing or shortness of breath.  Marland Kitchen amitriptyline (ELAVIL) 25 MG tablet Take 25 mg by mouth at bedtime. 1 po a.m. 3 po qhs  . atorvastatin (LIPITOR) 80 MG tablet Take 80 mg by mouth daily at 6 PM. 1/2 MWF  . buprenorphine (BUTRANS) 10 MCG/HR PTWK patch Place 10 mcg onto the skin once a week.  . Calcium Citrate-Vitamin D (CITRACAL + D PO) Take 1 tablet by mouth daily.   . CHANTIX 1 MG tablet TAKE 1 TABLET BY MOUTH TWICE DAILY  . Cholecalciferol (VITAMIN D3) 5000 UNITS TABS Take 5,000 mg by mouth daily.  . Estradiol Acetate 0.1 MG/24HR RING Insert vaginally every 3 months  . furosemide (LASIX) 40 MG tablet TAKE 1 TABLET BY MOUTH THREE TIMES DAILY FOR FLUID RETENTION  . Magnesium Oxide 500 MG TABS Take 500 mg by mouth daily.  . Nutritional Supplements (CARDIO COMPLETE) CAPS Take 1 capsule by mouth.  Vladimir Faster Glycol-Propyl Glycol (SYSTANE) 0.4-0.3 % SOLN Apply 1 drop to eye 4 (four) times daily.  Marland Kitchen PREVIDENT 5000 SENSITIVE 1.1-5 % PSTE   . PROAIR HFA 108 (90 BASE) MCG/ACT inhaler Inhale 2 puffs into the lungs every 6 (six) hours as needed for wheezing or  shortness of breath.    Current Problems (verified) Patient Active Problem List   Diagnosis Date Noted  . Axillary hidradenitis suppurativa 07/05/2014  . Sjogren's disease 07/05/2014  . Encounter for long-term (current) use of medications 03/26/2014  . Mixed hyperlipidemia 09/24/2013  . Prediabetes 09/24/2013  . Vitamin D deficiency 09/24/2013  . Rheumatoid arthritis 09/24/2013  . Arthritis   . Intrinsic asthma   . Hypertension   . Hypothyroidism   . Arthritis, degenerative 05/23/2008   Screening Tests Health Maintenance  Topic Date Due  . PAP SMEAR  04/04/1979  . TETANUS/TDAP  04/03/1980  . INFLUENZA VACCINE  04/06/2014  . MAMMOGRAM  10/19/2015  . COLONOSCOPY  10/12/2021   Immunization History  Administered Date(s) Administered  . DTaP 09/15/2012, 09/15/2012  . Influenza Whole 09/06/2012  . Pneumococcal Conjugate-13 09/15/2012, 07/05/2014  . Zoster 09/03/2013   Preventative care: Last colonoscopy: 10/13/2011  Prior vaccinations: Tdap: 09/15/2012  Influenza: 05/2014  Pneumococcal: 09/15/2012 Prevnar: 07/05/2014 Shingles/Zostavax: 09/03/2013  History reviewed: allergies, current medications, past family history, past medical history, past social history, past surgical history and problem list  Risk Factors: Tobacco History  Substance Use Topics  . Smoking status: Current Some Day Smoker -- 0.50 packs/day for 40 years    Types: Cigarettes  . Smokeless tobacco:  Not on file  . Alcohol Use: No   She does smoke and acknowledges agist advice to stop. Marland Kitchen   Alcohol Current alcohol use: none  Caffeine Current caffeine use: coffee 2 cups /day  Exercise Current exercise: none  Nutrition/Diet Current diet: in general, a "healthy" diet    Cardiac risk factors: dyslipidemia, hypertension, sedentary lifestyle and smoking/ tobacco exposure.  Depression Screen (Note: if answer to either of the following is "Yes", a more complete depression screening is indicated)    Q1: Over the past two weeks, have you felt down, depressed or hopeless? No  Q2: Over the past two weeks, have you felt little interest or pleasure in doing things? No  Have you lost interest or pleasure in daily life? No  Do you often feel hopeless? No  Do you cry easily over simple problems? No  Activities of Daily Living In your present state of health, do you have any difficulty performing the following activities?:  Driving? No Managing money?  No Feeding yourself? No Getting from bed to chair? No Climbing a flight of stairs? No Preparing food and eating?: No Bathing or showering? No Getting dressed: No Getting to the toilet? No Using the toilet:No Moving around from place to place: No In the past year have you fallen or had a near fall?:No   Are you sexually active?  Yes  Do you have more than one partner?  No  Vision Difficulties: No  Hearing Difficulties: No Do you often ask people to speak up or repeat themselves? No Do you experience ringing or noises in your ears? No Do you have difficulty understanding soft or whispered voices? Sometimes.  Cognition  Do you feel that you have a problem with memory?No  Do you often misplace items? No  Do you feel safe at home?  Yes  Advanced directives Does patient have a [place]? No- offered forms Does patient have a Living Will? No - offered forms  ROS:  In addition to the HPI above,  No Fever-chills,  No Headache, No changes with Vision or hearing,  No problems swallowing food or Liquids,  No Chest pain or productive Cough or Shortness of Breath,  No Abdominal pain, No Nausea or Vomitting, Bowel movements are regular,  No Blood in stool or Urine,  No dysuria,  No new skin rashes or bruises,  No new weakness, tingling, numbness in any extremity,  No recent weight loss,  No polyuria, polydypsia or polyphagia,  No significant Mental Stressors.  A full 10 point Review of Systems was done, except  as stated above, all other Review of Systems were negative  Objective:     BP 134/84   Pulse 80  Temp 97.7 F  Resp 16  Ht 5\' 3"    Wt 193 lb 12.8 oz    BMI 34.34   General appearance: alert, no distress, WD/WN, female   Cognitive Testing  Alert? Yes  Normal Appearance?Yes  Oriented to person? Yes  Place? Yes   Time? Yes  Recall of three objects?  Yes  Can perform simple calculations? Yes  Displays appropriate judgment? Yes  Can read the correct time from a watch/clock?Yes  HEENT: normocephalic, sclerae anicteric, TMs pearly, nares patent, no discharge or erythema, pharynx normal Oral cavity: MMM, no lesions Neck: supple, no lymphadenopathy, no thyromegaly, no masses Heart: RRR, normal S1, S2, no murmurs Lungs: CTA bilaterally, no wheezes, rhonchi, or rales Abdomen: +bs, soft, non tender, non distended, no masses, no  hepatomegaly, no splenomegaly Musculoskeletal: nontender, no swelling, no obvious deformity Extremities: no edema, no cyanosis, no clubbing Pulses: 2+ symmetric, upper and lower extremities, normal cap refill Neurological: alert, oriented x 3, CN2-12 intact, strength normal upper extremities and lower extremities, sensation normal throughout, DTRs 2+ throughout, no cerebellar signs, gait normal Psychiatric: normal affect, behavior normal, pleasant   Medicare Attestation I have personally reviewed: The patient's medical and social history Their use of alcohol, tobacco or illicit drugs Their current medications and supplements The patient's functional ability including ADLs,fall risks, home safety risks, cognitive, and hearing and visual impairment Diet and physical activities Evidence for depression or mood disorders  The patient's weight, height, BMI, and visual acuity have been recorded in the chart.  I have made referrals, counseling, and provided education to the patient based on review of the above and I have provided the patient with a written personalized  care plan for preventive services.    Yianni Skilling DAVID, MD   10/06/2014

## 2014-10-07 LAB — HEPATITIS B E ANTIBODY: Hepatitis Be Antibody: NONREACTIVE

## 2014-10-11 DIAGNOSIS — R312 Other microscopic hematuria: Secondary | ICD-10-CM | POA: Diagnosis not present

## 2014-10-11 DIAGNOSIS — K5730 Diverticulosis of large intestine without perforation or abscess without bleeding: Secondary | ICD-10-CM | POA: Diagnosis not present

## 2014-10-14 DIAGNOSIS — R3913 Splitting of urinary stream: Secondary | ICD-10-CM | POA: Diagnosis not present

## 2014-10-30 DIAGNOSIS — R278 Other lack of coordination: Secondary | ICD-10-CM | POA: Diagnosis not present

## 2014-10-30 DIAGNOSIS — R3913 Splitting of urinary stream: Secondary | ICD-10-CM | POA: Diagnosis not present

## 2014-10-30 DIAGNOSIS — M6281 Muscle weakness (generalized): Secondary | ICD-10-CM | POA: Diagnosis not present

## 2014-11-06 DIAGNOSIS — R278 Other lack of coordination: Secondary | ICD-10-CM | POA: Diagnosis not present

## 2014-11-06 DIAGNOSIS — M6281 Muscle weakness (generalized): Secondary | ICD-10-CM | POA: Diagnosis not present

## 2014-11-06 DIAGNOSIS — R3913 Splitting of urinary stream: Secondary | ICD-10-CM | POA: Diagnosis not present

## 2014-11-19 ENCOUNTER — Ambulatory Visit (INDEPENDENT_AMBULATORY_CARE_PROVIDER_SITE_OTHER): Payer: Medicare Other | Admitting: Internal Medicine

## 2014-11-19 ENCOUNTER — Ambulatory Visit (HOSPITAL_COMMUNITY)
Admission: RE | Admit: 2014-11-19 | Discharge: 2014-11-19 | Disposition: A | Discharge status: Home / Self Care | Payer: Medicare Other | Source: Ambulatory Visit | Attending: Internal Medicine | Admitting: Internal Medicine

## 2014-11-19 ENCOUNTER — Other Ambulatory Visit: Payer: Self-pay | Admitting: Internal Medicine

## 2014-11-19 ENCOUNTER — Encounter: Payer: Self-pay | Admitting: Internal Medicine

## 2014-11-19 VITALS — BP 136/82 | HR 90 | Temp 98.0°F | Resp 18 | Ht 63.0 in | Wt 187.0 lb

## 2014-11-19 DIAGNOSIS — R05 Cough: Secondary | ICD-10-CM | POA: Insufficient documentation

## 2014-11-19 DIAGNOSIS — J069 Acute upper respiratory infection, unspecified: Secondary | ICD-10-CM

## 2014-11-19 DIAGNOSIS — R0989 Other specified symptoms and signs involving the circulatory and respiratory systems: Secondary | ICD-10-CM | POA: Insufficient documentation

## 2014-11-19 DIAGNOSIS — R079 Chest pain, unspecified: Secondary | ICD-10-CM | POA: Insufficient documentation

## 2014-11-19 DIAGNOSIS — R0602 Shortness of breath: Secondary | ICD-10-CM | POA: Insufficient documentation

## 2014-11-19 DIAGNOSIS — J9801 Acute bronchospasm: Secondary | ICD-10-CM

## 2014-11-19 DIAGNOSIS — R509 Fever, unspecified: Secondary | ICD-10-CM | POA: Diagnosis not present

## 2014-11-19 DIAGNOSIS — R059 Cough, unspecified: Secondary | ICD-10-CM

## 2014-11-19 MED ORDER — DOXYCYCLINE HYCLATE 100 MG PO CAPS: 100.0000 mg | ORAL_CAPSULE | Freq: Two times a day (BID) | ORAL | Status: DC

## 2014-11-19 MED ORDER — PREDNISONE 20 MG PO TABS: ORAL_TABLET | ORAL | Status: DC

## 2014-11-19 NOTE — Progress Notes (Signed)
Patient ID: Autumn Frye, female   DOB: August 12, 1961, 54 y.o.   MRN: [id number]  HPI  Patient presents to the office for evaluation of cough.  It has been going on for 1 weeks.  Patient reports night > day, wet, barky, paroxysmal, no apparent relationship to exercise, worse with lying down.  They also endorse change in voice, chills, fever, postnasal drip, shortness of breath, sputum production, wheezing and sinus pressure and pain, ear pain, sore throat, myalgias, vomiting  posttussive. .  They have tried antibiotics or upright position.  They report that nothing has worked.  They denies other sick contacts.  Review of Systems  Constitutional: Positive for fever, chills and malaise/fatigue.  HENT: Positive for congestion, ear pain and sore throat. Negative for ear discharge and hearing loss.   Eyes: Negative.   Respiratory: Positive for cough, sputum production and shortness of breath. Negative for wheezing.   Cardiovascular: Negative for chest pain.  Gastrointestinal: Positive for vomiting. Negative for nausea, constipation, blood in stool and melena.  Genitourinary: Negative for dysuria, urgency and frequency.  Musculoskeletal: Positive for myalgias.  Skin: Negative.   Neurological: Positive for weakness and headaches.    PE:  Filed Vitals:   11/19/14 1540  BP: 136/82  Pulse: 90  Temp: 98 F (36.7 C)  Resp: 18     General:  Alert and non-toxic, WDWN, NAD HEENT: NCAT, PERLA, EOM normal, no occular discharge or erythema.  Nasal mucosal edema with sinus tenderness to palpation.  Oropharynx clear with minimal oropharyngeal edema and erythema.  Mucous membranes moist and pink. Neck:  Cervical adenopathy Chest:  RRR no MRGs.  Lungs clear to auscultation A&P with no rhonchi.  There is bilateral wheezes with some crackles in the right greater than the left.   Abdomen: +BS x 4 quadrants, soft, non-tender, no guarding, rigidity, or rebound. Skin: warm and dry no rash Neuro: A&Ox4, CN  II-XII grossly intact  Assessment and Plan:    1. Bronchospasm - start prednisone -scheduled albuterol nebulizer vs. Inhaler q4-6 hrs  2. Acute upper respiratory infection ddx includes bronchitis vs CAP vs viral URI vs. Allergic rhinitis.  Patient does take enbrel and is a much higher risk for bacterial infection.  Started on doxy as she is unable to take levaquin.    - DG Chest 2 View; Future - doxycycline (VIBRAMYCIN) 100 MG capsule; Take 1 capsule (100 mg total) by mouth 2 (two) times daily. One po bid x 7 days  Dispense: 20 capsule; Refill: 0 - predniSONE (DELTASONE) 20 MG tablet; 3 tabs po day one, then 2 tabs daily x 4 days  Dispense: 11 tablet; Refill: 0

## 2014-11-19 NOTE — Patient Instructions (Signed)
Upper Respiratory Infection, Adult An upper respiratory infection (URI) is also sometimes known as the common cold. The upper respiratory tract includes the nose, sinuses, throat, trachea, and bronchi. Bronchi are the airways leading to the lungs. Most people improve within 1 week, but symptoms can last up to 2 weeks. A residual cough may last even longer.  CAUSES Many different viruses can infect the tissues lining the upper respiratory tract. The tissues become irritated and inflamed and often become very moist. Mucus production is also common. A cold is contagious. You can easily spread the virus to others by oral contact. This includes kissing, sharing a glass, coughing, or sneezing. Touching your mouth or nose and then touching a surface, which is then touched by another person, can also spread the virus. SYMPTOMS  Symptoms typically develop 1 to 3 days after you come in contact with a cold virus. Symptoms vary from person to person. They may include:  Runny nose.  Sneezing.  Nasal congestion.  Sinus irritation.  Sore throat.  Loss of voice (laryngitis).  Cough.  Fatigue.  Muscle aches.  Loss of appetite.  Headache.  Low-grade fever. DIAGNOSIS  You might diagnose your own cold based on familiar symptoms, since most people get a cold 2 to 3 times a year. Your caregiver can confirm this based on your exam. Most importantly, your caregiver can check that your symptoms are not due to another disease such as strep throat, sinusitis, pneumonia, asthma, or epiglottitis. Blood tests, throat tests, and X-rays are not necessary to diagnose a common cold, but they may sometimes be helpful in excluding other more serious diseases. Your caregiver will decide if any further tests are required. RISKS AND COMPLICATIONS  You may be at risk for a more severe case of the common cold if you smoke cigarettes, have chronic heart disease (such as heart failure) or lung disease (such as asthma), or if  you have a weakened immune system. The very young and very old are also at risk for more serious infections. Bacterial sinusitis, middle ear infections, and bacterial pneumonia can complicate the common cold. The common cold can worsen asthma and chronic obstructive pulmonary disease (COPD). Sometimes, these complications can require emergency medical care and may be life-threatening. PREVENTION  The best way to protect against getting a cold is to practice good hygiene. Avoid oral or hand contact with people with cold symptoms. Wash your hands often if contact occurs. There is no clear evidence that vitamin C, vitamin E, echinacea, or exercise reduces the chance of developing a cold. However, it is always recommended to get plenty of rest and practice good nutrition. TREATMENT  Treatment is directed at relieving symptoms. There is no cure. Antibiotics are not effective, because the infection is caused by a virus, not by bacteria. Treatment may include:  Increased fluid intake. Sports drinks offer valuable electrolytes, sugars, and fluids.  Breathing heated mist or steam (vaporizer or shower).  Eating chicken soup or other clear broths, and maintaining good nutrition.  Getting plenty of rest.  Using gargles or lozenges for comfort.  Controlling fevers with ibuprofen or acetaminophen as directed by your caregiver.  Increasing usage of your inhaler if you have asthma. Zinc gel and zinc lozenges, taken in the first 24 hours of the common cold, can shorten the duration and lessen the severity of symptoms. Pain medicines may help with fever, muscle aches, and throat pain. A variety of non-prescription medicines are available to treat congestion and runny nose. Your caregiver   can make recommendations and may suggest nasal or lung inhalers for other symptoms.  HOME CARE INSTRUCTIONS   Only take over-the-counter or prescription medicines for pain, discomfort, or fever as directed by your  caregiver.  Use a warm mist humidifier or inhale steam from a shower to increase air moisture. This may keep secretions moist and make it easier to breathe.  Drink enough water and fluids to keep your urine clear or pale yellow.  Rest as needed.  Return to work when your temperature has returned to normal or as your caregiver advises. You may need to stay home longer to avoid infecting others. You can also use a face mask and careful hand washing to prevent spread of the virus. SEEK MEDICAL CARE IF:   After the first few days, you feel you are getting worse rather than better.  You need your caregiver's advice about medicines to control symptoms.  You develop chills, worsening shortness of breath, or brown or red sputum. These may be signs of pneumonia.  You develop yellow or brown nasal discharge or pain in the face, especially when you bend forward. These may be signs of sinusitis.  You develop a fever, swollen neck glands, pain with swallowing, or white areas in the back of your throat. These may be signs of strep throat. SEEK IMMEDIATE MEDICAL CARE IF:   You have a fever.  You develop severe or persistent headache, ear pain, sinus pain, or chest pain.  You develop wheezing, a prolonged cough, cough up blood, or have a change in your usual mucus (if you have chronic lung disease).  You develop sore muscles or a stiff neck. Document Released: 02/16/2001 Document Revised: 11/15/2011 Document Reviewed: 11/28/2013 [employer] Patient Information 2015 [employer]. This information is not intended to replace advice given to you by your health care provider. Make sure you discuss any questions you have with your health care provider.  

## 2015-01-22 ENCOUNTER — Encounter: Payer: Self-pay | Admitting: Physician Assistant

## 2015-01-22 ENCOUNTER — Ambulatory Visit (INDEPENDENT_AMBULATORY_CARE_PROVIDER_SITE_OTHER): Payer: Medicare Other | Admitting: Physician Assistant

## 2015-01-22 VITALS — BP 120/68 | HR 82 | Temp 98.2°F | Resp 18 | Ht 63.0 in | Wt 193.0 lb

## 2015-01-22 DIAGNOSIS — E039 Hypothyroidism, unspecified: Secondary | ICD-10-CM

## 2015-01-22 DIAGNOSIS — E782 Mixed hyperlipidemia: Secondary | ICD-10-CM

## 2015-01-22 DIAGNOSIS — R7309 Other abnormal glucose: Secondary | ICD-10-CM

## 2015-01-22 DIAGNOSIS — M069 Rheumatoid arthritis, unspecified: Secondary | ICD-10-CM

## 2015-01-22 DIAGNOSIS — E559 Vitamin D deficiency, unspecified: Secondary | ICD-10-CM

## 2015-01-22 DIAGNOSIS — Z79899 Other long term (current) drug therapy: Secondary | ICD-10-CM

## 2015-01-22 DIAGNOSIS — R7303 Prediabetes: Secondary | ICD-10-CM

## 2015-01-22 DIAGNOSIS — M3500 Sicca syndrome, unspecified: Secondary | ICD-10-CM

## 2015-01-22 DIAGNOSIS — K219 Gastro-esophageal reflux disease without esophagitis: Secondary | ICD-10-CM

## 2015-01-22 DIAGNOSIS — I10 Essential (primary) hypertension: Secondary | ICD-10-CM | POA: Diagnosis not present

## 2015-01-22 DIAGNOSIS — R1011 Right upper quadrant pain: Secondary | ICD-10-CM

## 2015-01-22 LAB — CBC WITH DIFFERENTIAL/PLATELET
Basophils Absolute: 0 10*3/uL (ref 0.0–0.1)
Basophils Relative: 0 % (ref 0–1)
Eosinophils Absolute: 0 10*3/uL (ref 0.0–0.7)
Eosinophils Relative: 0 % (ref 0–5)
HEMATOCRIT: 44.5 % (ref 36.0–46.0)
Hemoglobin: 15 g/dL (ref 12.0–15.0)
LYMPHS ABS: 4.3 10*3/uL — AB (ref 0.7–4.0)
Lymphocytes Relative: 60 % — ABNORMAL HIGH (ref 12–46)
MCH: 29.6 pg (ref 26.0–34.0)
MCHC: 33.7 g/dL (ref 30.0–36.0)
MCV: 87.8 fL (ref 78.0–100.0)
MONO ABS: 0.5 10*3/uL (ref 0.1–1.0)
MONOS PCT: 7 % (ref 3–12)
MPV: 10.2 fL (ref 8.6–12.4)
NEUTROS ABS: 2.3 10*3/uL (ref 1.7–7.7)
Neutrophils Relative %: 33 % — ABNORMAL LOW (ref 43–77)
PLATELETS: 295 10*3/uL (ref 150–400)
RBC: 5.07 MIL/uL (ref 3.87–5.11)
RDW: 14.1 % (ref 11.5–15.5)
WBC: 7.1 10*3/uL (ref 4.0–10.5)

## 2015-01-22 MED ORDER — RANITIDINE HCL 300 MG PO TABS: 300.0000 mg | ORAL_TABLET | Freq: Every day | ORAL | Status: DC

## 2015-01-22 NOTE — Patient Instructions (Addendum)
Can get synthroid direct is supported by [employer] in [place], [place]. It is a way to get BRAND NAME synthroid sent directly to your house. I will send a prescription to a mail order pharmacy, you need to call the pharmacy at [phone] or go online to SynthroidDirect.com to get started.   Cost:  Pay 25$ for a one month supply Pay 45$ for a two month supply Pay 65$ for a 3 month supply and save 10$   Food Choices for Gastroesophageal Reflux Disease When you have gastroesophageal reflux disease (GERD), the foods you eat and your eating habits are very important. Choosing the right foods can help ease the discomfort of GERD. WHAT GENERAL GUIDELINES DO I NEED TO FOLLOW?  Choose fruits, vegetables, whole grains, low-fat dairy products, and low-fat meat, fish, and poultry.  Limit fats such as oils, salad dressings, butter, nuts, and avocado.  Keep a food diary to identify foods that cause symptoms.  Avoid foods that cause reflux. These may be different for different people.  Eat frequent small meals instead of three large meals each day.  Eat your meals slowly, in a relaxed setting.  Limit fried foods.  Cook foods using methods other than frying.  Avoid drinking alcohol.  Avoid drinking large amounts of liquids with your meals.  Avoid bending over or lying down until 2-3 hours after eating. WHAT FOODS ARE NOT RECOMMENDED? The following are some foods and drinks that may worsen your symptoms: Vegetables Tomatoes. Tomato juice. Tomato and spaghetti sauce. Chili peppers. Onion and garlic. Horseradish. Fruits Oranges, grapefruit, and lemon (fruit and juice). Meats High-fat meats, fish, and poultry. This includes hot dogs, ribs, ham, sausage, salami, and bacon. Dairy Whole milk and chocolate milk. Sour cream. Cream. Butter. Ice cream. Cream cheese.  Beverages Coffee and tea, with or without caffeine. Carbonated beverages or energy drinks. Condiments Hot sauce. Barbecue  sauce.  Sweets/Desserts Chocolate and cocoa. Donuts. Peppermint and spearmint. Fats and Oils High-fat foods, including [place] fries and potato chips. Other Vinegar. Strong spices, such as black pepper, white pepper, red pepper, cayenne, curry powder, cloves, ginger, and chili powder. The items listed above may not be a complete list of foods and beverages to avoid. Contact your dietitian for more information. Document Released: 08/23/2005 Document Revised: 08/28/2013 Document Reviewed: 06/27/2013 [facility] Patient Information 2015 [place], [place]. This information is not intended to replace advice given to you by your health care provider. Make sure you discuss any questions you have with your health care provider.   Cholecystitis  Cholecystitis is swelling and irritation (inflammation) of your gallbladder. This often happens when gallstones or sludge build up in the gallbladder. Treatment is needed right away. [place] care depends on how you were treated. In general:  If you were given antibiotic medicine, take it as told. Finish the medicine even if you start to feel better.  Only take medicines as told by your doctor.  Eat low-fat foods until your next doctor visit.  Keep all doctor visits as told. GET HELP RIGHT AWAY IF:  You have more pain and medicine does not help.  Your pain moves to a different part of your belly (abdomen) or to your back.  You have a fever.  You feel sick to your stomach (nauseous).  You throw up (vomit). MAKE SURE YOU:  Understand these instructions.  Will watch your condition.  Will get help right away if you are not doing well or get worse. Document Released: 08/12/2011 Document Revised: 11/15/2011  Document Reviewed: 08/12/2011 [facility] Patient Information 2015 [place]. This information is not intended to replace advice given to you by your health care provider. Make sure you discuss any questions you have with your health care  provider.

## 2015-01-22 NOTE — Progress Notes (Signed)
Assessment and Plan:  1. Hypertension -Continue medication, monitor blood pressure at home. Continue DASH diet.  Reminder to go to the ER if any CP, SOB, nausea, dizziness, severe HA, changes vision/speech, left arm numbness and tingling and jaw pain.  2. Cholesterol -Continue diet and exercise. Check cholesterol.   3. Prediabetes  -Continue diet and exercise. Check A1C  4. Vitamin D Def - check level and continue medications.   5. Hypothyroidism- check TSH level, continue medications the same, reminded to take on an empty stomach 30-55mins before food. On 1 daily of 172mcg but 1/2 T,T.  6. RUQ pain Get [place] rule out gallstones, get on zantac 300 mg  Continue diet and meds as discussed. Further disposition pending results of labs. Over 30 minutes of exam, counseling, chart review, and critical decision making was performed  HPI 54 y.o. female  presents for 3 month follow up on hypertension, cholesterol, prediabetes, and vitamin D deficiency.   Her blood pressure has been controlled at home, today their BP is BP: 120/68 mmHg  She does not workout due to RA. She denies chest pain, shortness of breath, dizziness.  She is on cholesterol medication and denies myalgias. Her cholesterol is not at goal. The cholesterol last visit was:   Lab Results  Component Value Date   CHOL 292* 10/03/2014   HDL 66 10/03/2014   LDLCALC 190* 10/03/2014   TRIG 179* 10/03/2014   CHOLHDL 4.4 10/03/2014   She has been working on diet and exercise for prediabetes, and denies paresthesia of the feet, polydipsia, polyuria and visual disturbances. Last A1C in the office was:  Lab Results  Component Value Date   HGBA1C 5.6 10/03/2014  Follows with Dr. Ouida Sills for RA, sees him next week, has diffuse pain at this time, worse right shoulder and left foot Patient is on Vitamin D supplement.   Lab Results  Component Value Date   VD25OH 34 10/03/2014    She is on thyroid medication. Her medication was not  changed last visit, she is currently on 18mcg every day but Tuesday and Thursday she is on 1/2.   Lab Results  Component Value Date   TSH 10.431* 10/03/2014  .  For 1-2 months she has been having worsening GERD with epigastric pain radiating around to RUQ, worse with fatty foods. No vomiting.      Current Medications:  Current Outpatient Prescriptions on File Prior to Visit  Medication Sig Dispense Refill  . albuterol (PROVENTIL) (2.5 MG/3ML) 0.083% nebulizer solution Take 3 mLs (2.5 mg total) by nebulization every 6 (six) hours as needed for wheezing or shortness of breath. 60 vial 1  . amitriptyline (ELAVIL) 25 MG tablet Take 25 mg by mouth at bedtime. 1 po a.m. 3 po qhs    . atorvastatin (LIPITOR) 80 MG tablet Take 80 mg by mouth daily at 6 PM. 1/2 MWF    . AVENOVA 0.01 % SOLN Apply to eyelids BID  1  . buprenorphine (BUTRANS) 10 MCG/HR PTWK patch Place 10 mcg onto the skin once a week.    . Calcium Citrate-Vitamin D (CITRACAL + D PO) Take 1 tablet by mouth daily.     . CHANTIX 1 MG tablet TAKE 1 TABLET BY MOUTH TWICE DAILY 60 tablet 3  . Cholecalciferol (VITAMIN D3) 5000 UNITS TABS Take 5,000 mg by mouth daily.    Scarlette Shorts SURECLICK 50 MG/ML injection     . Estradiol Acetate 0.1 MG/24HR RING Insert vaginally every 3 months 1 each  3  . furosemide (LASIX) 40 MG tablet TAKE 1 TABLET BY MOUTH THREE TIMES DAILY FOR FLUID RETENTION 90 tablet PRN  . LOTEMAX 0.5 % GEL Place 1 drop into both eyes 3 (three) times daily.   2  . Magnesium Oxide 500 MG TABS Take 500 mg by mouth daily.    . Nutritional Supplements (CARDIO COMPLETE) CAPS Take 1 capsule by mouth.    Vladimir Faster Glycol-Propyl Glycol (SYSTANE) 0.4-0.3 % SOLN Apply 1 drop to eye 4 (four) times daily.    Marland Kitchen PREVIDENT 5000 SENSITIVE 1.1-5 % PSTE     . PROAIR HFA 108 (90 BASE) MCG/ACT inhaler Inhale 2 puffs into the lungs every 6 (six) hours as needed for wheezing or shortness of breath.     . RESTASIS 0.05 % ophthalmic emulsion   3  .  SYNTHROID 150 MCG tablet TAKE 1 TABLET BY MOUTH ONCE DAILY 90 tablet PRN  . tamsulosin (FLOMAX) 0.4 MG CAPS capsule Take 0.4 mg by mouth daily.   11   No current facility-administered medications on file prior to visit.   Medical History:  Past Medical History  Diagnosis Date  . Cancer     thyroid-1995  . Hiatal hernia   . GERD (gastroesophageal reflux disease)   . Arthritis     OA, RA  . Asthma   . Hyperlipidemia   . Hypertension   . Hypothyroidism    Allergies:  Allergies  Allergen Reactions  . Hydrocodone Itching  . Morphine And Related Itching  . Compazine [Prochlorperazine Edisylate]   . Levaquin [Levofloxacin] Hives  . Lyrica [Pregabalin]     hallucinations  . Ciprofloxacin Hives and Swelling  . Penicillins Itching  . Sulfa Antibiotics Itching    Review of Systems:  Review of Systems  Constitutional: Positive for malaise/fatigue. Negative for fever, chills, weight loss and diaphoresis.  HENT: Negative for congestion, ear discharge, ear pain, hearing loss, nosebleeds, sore throat and tinnitus.   Respiratory: Negative.  Negative for stridor.   Cardiovascular: Negative.   Gastrointestinal: Positive for heartburn, nausea and abdominal pain. Negative for vomiting, diarrhea, constipation, blood in stool and melena.  Genitourinary: Negative.   Musculoskeletal: Positive for myalgias, back pain, joint pain and neck pain.  Skin: Positive for rash (bilateral arms). Negative for itching.  Neurological: Negative for weakness and headaches.  Psychiatric/Behavioral: Negative.     Family history- Review and unchanged Social history- Review and unchanged Physical Exam: BP 120/68 mmHg  Pulse 82  Temp(Src) 98.2 F (36.8 C) (Temporal)  Resp 18  Ht 5\' 3"  (1.6 m)  Wt 193 lb (87.544 kg)  BMI 34.20 kg/m2  LMP  (LMP Unknown) Wt Readings from Last 3 Encounters:  01/22/15 193 lb (87.544 kg)  11/19/14 187 lb (84.823 kg)  10/03/14 193 lb 12.8 oz (87.907 kg)   General  Appearance: Well nourished, in no apparent distress. Eyes: PERRLA, EOMs, conjunctiva no swelling or erythema Sinuses: + Frontal/maxillary tenderness ENT/Mouth: Ext aud canals clear, TMs without erythema, bulging. No erythema, swelling, or exudate on post pharynx.  Tonsils not swollen or erythematous. Hearing normal.  Neck: Supple, thyroid normal.  Respiratory: Respiratory effort normal, BS equal bilaterally without rales, rhonchi, wheezing or stridor.  Cardio: RRR with no MRGs. Brisk peripheral pulses without edema.  Abdomen: Soft, + BS, + RUQ  Tenderness with + murphy's, no hernias, masses. Lymphatics: Non tender without lymphadenopathy.  Musculoskeletal: decreased ROM right shoulder and left foot, 5/5 strength, antalgic gait, diffuse tenderness Skin: + erythematous tender abscess bilateral  arms without fluctuation/warmth at this time. Warm, dry without rashes, lesions, ecchymosis.  Neuro: Cranial nerves intact. Normal muscle tone, no cerebellar symptoms. Sensation intact.  Psych: Awake and oriented X 3, normal affect, Insight and Judgment appropriate.    Vicie Mutters, PA-C 3:19 PM [facility]

## 2015-01-23 LAB — HEPATIC FUNCTION PANEL
ALT: 26 U/L (ref 0–35)
AST: 23 U/L (ref 0–37)
Albumin: 4.2 g/dL (ref 3.5–5.2)
Alkaline Phosphatase: 55 U/L (ref 39–117)
BILIRUBIN TOTAL: 0.3 mg/dL (ref 0.2–1.2)
Bilirubin, Direct: 0.1 mg/dL (ref 0.0–0.3)
Indirect Bilirubin: 0.2 mg/dL (ref 0.2–1.2)
Total Protein: 7.1 g/dL (ref 6.0–8.3)

## 2015-01-23 LAB — INSULIN, FASTING: Insulin fasting, serum: 4.1 u[IU]/mL (ref 2.0–19.6)

## 2015-01-23 LAB — BASIC METABOLIC PANEL WITH GFR
BUN: 16 mg/dL (ref 6–23)
CALCIUM: 10 mg/dL (ref 8.4–10.5)
CO2: 28 mEq/L (ref 19–32)
CREATININE: 0.62 mg/dL (ref 0.50–1.10)
Chloride: 101 mEq/L (ref 96–112)
GFR, Est African American: >89 mL/min
GFR, Est Non African American: >89 mL/min
GLUCOSE: 79 mg/dL (ref 70–99)
POTASSIUM: 4.6 meq/L (ref 3.5–5.3)
Sodium: 137 mEq/L (ref 135–145)

## 2015-01-23 LAB — TSH: TSH: 1.157 u[IU]/mL (ref 0.350–4.500)

## 2015-01-23 LAB — LIPID PANEL
CHOLESTEROL: 218 mg/dL — AB (ref 0–200)
HDL: 46 mg/dL (ref >=46)
LDL Cholesterol: 126 mg/dL — ABNORMAL HIGH (ref 0–99)
Total CHOL/HDL Ratio: 4.7 Ratio
Triglycerides: 230 mg/dL — ABNORMAL HIGH (ref <150)
VLDL: 46 mg/dL — ABNORMAL HIGH (ref 0–40)

## 2015-01-23 LAB — HEMOGLOBIN A1C
Hgb A1c MFr Bld: 5.4 % (ref <5.7)
MEAN PLASMA GLUCOSE: 108 mg/dL (ref <117)

## 2015-01-23 LAB — MAGNESIUM: MAGNESIUM: 1.9 mg/dL (ref 1.5–2.5)

## 2015-01-23 LAB — VITAMIN D 25 HYDROXY (VIT D DEFICIENCY, FRACTURES): Vit D, 25-Hydroxy: 32 ng/mL (ref 30–100)

## 2015-01-29 ENCOUNTER — Ambulatory Visit
Admission: RE | Admit: 2015-01-29 | Discharge: 2015-01-29 | Disposition: A | Discharge status: Home / Self Care | Payer: Medicare Other | Source: Ambulatory Visit | Attending: Physician Assistant | Admitting: Physician Assistant

## 2015-01-29 DIAGNOSIS — R1011 Right upper quadrant pain: Secondary | ICD-10-CM

## 2015-01-30 ENCOUNTER — Encounter: Payer: Self-pay | Admitting: Physician Assistant

## 2015-01-30 ENCOUNTER — Telehealth: Payer: Self-pay | Admitting: *Deleted

## 2015-01-30 DIAGNOSIS — R110 Nausea: Secondary | ICD-10-CM

## 2015-01-30 DIAGNOSIS — M0609 Rheumatoid arthritis without rheumatoid factor, multiple sites: Secondary | ICD-10-CM | POA: Diagnosis not present

## 2015-01-30 DIAGNOSIS — M797 Fibromyalgia: Secondary | ICD-10-CM | POA: Diagnosis not present

## 2015-01-30 DIAGNOSIS — R1011 Right upper quadrant pain: Secondary | ICD-10-CM

## 2015-01-30 NOTE — Telephone Encounter (Signed)
Pt wants to go ahead & schedule the scan if possible, per pt just let her know

## 2015-02-05 ENCOUNTER — Encounter: Payer: Self-pay | Admitting: Internal Medicine

## 2015-02-21 ENCOUNTER — Encounter (HOSPITAL_COMMUNITY): Payer: Medicare Other

## 2015-02-28 ENCOUNTER — Ambulatory Visit (HOSPITAL_COMMUNITY)
Admission: RE | Admit: 2015-02-28 | Discharge: 2015-02-28 | Disposition: A | Discharge status: Home / Self Care | Payer: Medicare Other | Source: Ambulatory Visit | Attending: Physician Assistant | Admitting: Physician Assistant

## 2015-02-28 DIAGNOSIS — K219 Gastro-esophageal reflux disease without esophagitis: Secondary | ICD-10-CM | POA: Diagnosis not present

## 2015-02-28 DIAGNOSIS — R110 Nausea: Secondary | ICD-10-CM

## 2015-02-28 DIAGNOSIS — R1011 Right upper quadrant pain: Secondary | ICD-10-CM | POA: Insufficient documentation

## 2015-02-28 DIAGNOSIS — R112 Nausea with vomiting, unspecified: Secondary | ICD-10-CM | POA: Diagnosis not present

## 2015-02-28 MED ORDER — SINCALIDE 5 MCG IJ SOLR
0.0200 ug/kg | Freq: Once | INTRAMUSCULAR | Status: AC
  Administered 2015-02-28: 1.8 ug via INTRAVENOUS

## 2015-02-28 MED ORDER — TECHNETIUM TC 99M MEBROFENIN IV KIT
5.1000 | PACK | Freq: Once | INTRAVENOUS | Status: AC | PRN
  Administered 2015-02-28: 5 via INTRAVENOUS

## 2015-03-26 ENCOUNTER — Other Ambulatory Visit: Payer: Self-pay | Admitting: Physician Assistant

## 2015-04-02 ENCOUNTER — Encounter: Payer: Self-pay | Admitting: Gastroenterology

## 2015-04-24 ENCOUNTER — Ambulatory Visit (INDEPENDENT_AMBULATORY_CARE_PROVIDER_SITE_OTHER): Payer: Medicare Other | Admitting: Internal Medicine

## 2015-04-24 ENCOUNTER — Other Ambulatory Visit: Payer: Self-pay | Admitting: *Deleted

## 2015-04-24 ENCOUNTER — Encounter: Payer: Self-pay | Admitting: Internal Medicine

## 2015-04-24 VITALS — BP 112/76 | HR 84 | Temp 97.5°F | Resp 16 | Ht 63.0 in | Wt 192.0 lb

## 2015-04-24 DIAGNOSIS — M069 Rheumatoid arthritis, unspecified: Secondary | ICD-10-CM

## 2015-04-24 DIAGNOSIS — E039 Hypothyroidism, unspecified: Secondary | ICD-10-CM | POA: Diagnosis not present

## 2015-04-24 DIAGNOSIS — E559 Vitamin D deficiency, unspecified: Secondary | ICD-10-CM

## 2015-04-24 DIAGNOSIS — E782 Mixed hyperlipidemia: Secondary | ICD-10-CM | POA: Diagnosis not present

## 2015-04-24 DIAGNOSIS — R7303 Prediabetes: Secondary | ICD-10-CM

## 2015-04-24 DIAGNOSIS — R7309 Other abnormal glucose: Secondary | ICD-10-CM | POA: Diagnosis not present

## 2015-04-24 DIAGNOSIS — I10 Essential (primary) hypertension: Secondary | ICD-10-CM | POA: Diagnosis not present

## 2015-04-24 DIAGNOSIS — Z79899 Other long term (current) drug therapy: Secondary | ICD-10-CM

## 2015-04-24 DIAGNOSIS — Z6834 Body mass index (BMI) 34.0-34.9, adult: Secondary | ICD-10-CM

## 2015-04-24 DIAGNOSIS — Z0000 Encounter for general adult medical examination without abnormal findings: Secondary | ICD-10-CM | POA: Insufficient documentation

## 2015-04-24 LAB — BASIC METABOLIC PANEL WITH GFR
BUN: 15 mg/dL (ref 7–25)
CHLORIDE: 99 mmol/L (ref 98–110)
CO2: 26 mmol/L (ref 20–31)
CREATININE: 0.58 mg/dL (ref 0.50–1.05)
Calcium: 9.7 mg/dL (ref 8.6–10.4)
GFR, Est African American: >89 mL/min (ref >=60)
GFR, Est Non African American: >89 mL/min (ref >=60)
Glucose, Bld: 94 mg/dL (ref 65–99)
Potassium: 4.5 mmol/L (ref 3.5–5.3)
Sodium: 138 mmol/L (ref 135–146)

## 2015-04-24 LAB — CBC WITH DIFFERENTIAL/PLATELET
Basophils Absolute: 0 10*3/uL (ref 0.0–0.1)
Basophils Relative: 0 % (ref 0–1)
Eosinophils Absolute: 0 10*3/uL (ref 0.0–0.7)
Eosinophils Relative: 0 % (ref 0–5)
HEMATOCRIT: 43.5 % (ref 36.0–46.0)
HEMOGLOBIN: 14.8 g/dL (ref 12.0–15.0)
LYMPHS PCT: 47 % — AB (ref 12–46)
Lymphs Abs: 3.9 10*3/uL (ref 0.7–4.0)
MCH: 29.6 pg (ref 26.0–34.0)
MCHC: 34 g/dL (ref 30.0–36.0)
MCV: 87 fL (ref 78.0–100.0)
MONOS PCT: 5 % (ref 3–12)
MPV: 9.5 fL (ref 8.6–12.4)
Monocytes Absolute: 0.4 10*3/uL (ref 0.1–1.0)
NEUTROS ABS: 3.9 10*3/uL (ref 1.7–7.7)
NEUTROS PCT: 48 % (ref 43–77)
Platelets: 312 10*3/uL (ref 150–400)
RBC: 5 MIL/uL (ref 3.87–5.11)
RDW: 15.2 % (ref 11.5–15.5)
WBC: 8.2 10*3/uL (ref 4.0–10.5)

## 2015-04-24 LAB — HEPATIC FUNCTION PANEL
ALT: 43 U/L — AB (ref 6–29)
AST: 35 U/L (ref 10–35)
Albumin: 4.3 g/dL (ref 3.6–5.1)
Alkaline Phosphatase: 64 U/L (ref 33–130)
Bilirubin, Direct: 0.1 mg/dL (ref <=0.2)
Indirect Bilirubin: 0.2 mg/dL (ref 0.2–1.2)
Total Bilirubin: 0.3 mg/dL (ref 0.2–1.2)
Total Protein: 6.9 g/dL (ref 6.1–8.1)

## 2015-04-24 LAB — MAGNESIUM: Magnesium: 1.8 mg/dL (ref 1.5–2.5)

## 2015-04-24 LAB — LIPID PANEL
CHOL/HDL RATIO: 5.5 ratio — AB (ref <=5.0)
Cholesterol: 269 mg/dL — ABNORMAL HIGH (ref 125–200)
HDL: 49 mg/dL (ref >=46)
LDL Cholesterol: 179 mg/dL — ABNORMAL HIGH (ref <130)
Triglycerides: 206 mg/dL — ABNORMAL HIGH (ref <150)
VLDL: 41 mg/dL — ABNORMAL HIGH (ref <30)

## 2015-04-24 LAB — TSH: TSH: 0.686 u[IU]/mL (ref 0.350–4.500)

## 2015-04-24 MED ORDER — ATORVASTATIN CALCIUM 80 MG PO TABS: 80.0000 mg | ORAL_TABLET | Freq: Every day | ORAL | Status: DC

## 2015-04-24 NOTE — Patient Instructions (Signed)

## 2015-04-24 NOTE — Progress Notes (Signed)
Patient ID: Autumn Frye, female   DOB: 1961-02-03, 54 y.o.   MRN: [id number]   This very nice 54 y.o. MWF presents for 3 month follow up with Hypertension, Hyperlipidemia, Pre-Diabetes and Vitamin D Deficiency. Patient has hx/o RA since 2009 and had been followed by Dr Ouida Sills who recently retired and she is scheduled to see a new rheumatologist. Sx's primarily in her hands/wrists and have been controlled with Enbrel and Butrans patch.    Patient is treated for HTN since 2000 & BP has been controlled at home. Today's BP: 112/76 mmHg. Patient has had no complaints of any cardiac type chest pain, palpitations, dyspnea/orthopnea/PND, dizziness, claudication, or dependent edema.   Hyperlipidemia is not controlled with diet & meds. Patient denies myalgias or other med SE's. Last Lipids were not at goal - Cholesterol 218*; HDL 46; LDL 126*; and elevated Triglycerides 230 on 01/22/2015.   Also, the patient has history of PreDiabetes since Dec 20210 with A1c 5.9% and has had no symptoms of reactive hypoglycemia, diabetic polys, paresthesias or visual blurring.  Last A1c was 5.4% on 01/22/2015.    Further, the patient also has history of Vitamin D Deficiency and supplements vitamin D sporadically. Last vitamin D was still very low at 32 on 01/22/2015.   Medication Sig  . albuterol (PROVENTIL) (2.5 MG/3ML) 0.083% nebulizer solution Take 3 mLs (2.5 mg total) by nebulization every 6 (six) hours as needed for wheezing or shortness of breath.  Marland Kitchen amitriptyline (ELAVIL) 25 MG tablet Take 25 mg by mouth at bedtime. 1 po a.m. 3 po qhs  . AVENOVA 0.01 % SOLN Apply to eyelids BID  . buprenorphine (BUTRANS) 10 MCG/HR PTWK patch Place 10 mcg onto the skin once a week.  . Calcium Citrate-Vitamin D (CITRACAL + D PO) Take 1 tablet by mouth daily.   . CHANTIX 1 MG tablet TAKE 1 TABLET BY MOUTH TWICE DAILY  . Cholecalciferol (VITAMIN D3) 5000 UNITS TABS Take 5,000 mg by mouth daily.  Marland Kitchen doxycycline (VIBRAMYCIN) 50 MG capsule  Take 50 mg by mouth daily.  Scarlette Shorts SURECLICK 50 MG/ML injection   . Estradiol Acetate 0.1 MG/24HR RING Insert vaginally every 3 months  . furosemide (LASIX) 40 MG tablet TAKE 1 TABLET BY MOUTH THREE TIMES DAILY FOR FLUID RETENTION  . LOTEMAX 0.5 % GEL Place 1 drop into both eyes 3 (three) times daily.   . Magnesium Oxide 500 MG TABS Take 500 mg by mouth daily.  . Nutritional Supplements (CARDIO COMPLETE) CAPS Take 1 capsule by mouth.  . SYSTANE 0.4-0.3 % SOLN Apply 1 drop to eye 4 (four) times daily.  Marland Kitchen PROAIR HFA 108 (90 BASE) MCG/ACT inhaler Inhale 2 puffs into the lungs every 6 (six) hours as needed for wheezing or shortness of breath.   . ranitidine (ZANTAC) 300 MG tablet TAKE 1 TABLET(300 MG) BY MOUTH AT BEDTIME  . RESTASIS 0.05 % ophthalmic emulsion   . SYNTHROID 150 MCG tablet TAKE 1 TABLET BY MOUTH ONCE DAILY  . tamsulosin (FLOMAX) 0.4 MG CAPS capsule Take 0.4 mg by mouth daily.   Marland Kitchen atorvastatin (LIPITOR) 80 MG tablet Take 80 mg by mouth daily at 6 PM. 1/2 MWF   Allergies  Allergen Reactions  . Hydrocodone Itching  . Morphine And Related Itching  . Compazine [Prochlorperazine Edisylate]   . Levaquin [Levofloxacin] Hives  . Lyrica [Pregabalin]     hallucinations  . Ciprofloxacin Hives and Swelling  . Penicillins Itching  . Sulfa Antibiotics Itching   PMHx:  Past Medical History  Diagnosis Date  . Cancer     thyroid-1995  . Hiatal hernia   . GERD (gastroesophageal reflux disease)   . Arthritis     OA, RA  . Asthma   . Hyperlipidemia   . Hypertension   . Hypothyroidism    Immunization History  Administered Date(s) Administered  . DTaP 09/15/2012, 09/15/2012  . Influenza Whole 09/06/2012  . Influenza-Unspecified 05/07/2014  . Pneumococcal Conjugate-13 09/15/2012, 07/05/2014  . Zoster 09/03/2013   Past Surgical History  Procedure Laterality Date  . Thyroidectomy    . Upper gastrointestinal endoscopy    . Total knee arthroplasty      right  . Vaginal  hysterectomy    . Knee surgery      3 on right knee, 1 on left-prior to TKR  . Pilonidal cyst excision    . Tonsillectomy    . Anterior lat lumbar fusion Left 01/26/2013    Procedure: ANTERIOR LATERAL LUMBAR FUSION 1 LEVEL;  Surgeon: Faythe Ghee, MD;  Location: [facility];  Service: Neurosurgery;  Laterality: Left;  lumbar four-five  . Lumbar percutaneous pedicle screw 1 level Left 01/26/2013    Procedure: LUMBAR PERCUTANEOUS PEDICLE SCREW 1 LEVEL;  Surgeon: Faythe Ghee, MD;  Location: [facility];  Service: Neurosurgery;  Laterality: Left;  lumbar four-five   FHx:    Reviewed / unchanged  SHx:    Reviewed / unchanged  Systems Review:  Constitutional: Denies fever, chills, wt changes, headaches, insomnia, fatigue, night sweats, change in appetite. Eyes: Denies redness, blurred vision, diplopia, discharge, itchy, watery eyes.  ENT: Denies discharge, congestion, post nasal drip, epistaxis, sore throat, earache, hearing loss, dental pain, tinnitus, vertigo, sinus pain, snoring.  CV: Denies chest pain, palpitations, irregular heartbeat, syncope, dyspnea, diaphoresis, orthopnea, PND, claudication or edema. Respiratory: denies cough, dyspnea, DOE, pleurisy, hoarseness, laryngitis, wheezing.  Gastrointestinal: Denies dysphagia, odynophagia, heartburn, reflux, water brash, abdominal pain or cramps, nausea, vomiting, bloating, diarrhea, constipation, hematemesis, melena, hematochezia  or hemorrhoids. Genitourinary: Denies dysuria, frequency, urgency, nocturia, hesitancy, discharge, hematuria or flank pain. Musculoskeletal: Denies arthralgias, myalgias, stiffness, jt. swelling, pain, limping or strain/sprain.  Skin: Denies pruritus, rash, hives, warts, acne, eczema or change in skin lesion(s). Neuro: No weakness, tremor, incoordination, spasms, paresthesia or pain. Psychiatric: Denies confusion, memory loss or sensory loss. Endo: Denies change in weight, skin or hair change.  Heme/Lymph: No  excessive bleeding, bruising or enlarged lymph nodes.  Physical Exam  BP 112/76   Pulse 84  Temp 97.5 F   Resp 16  Ht 5\' 3"    Wt 192 lb     BMI 34.02  Appears well nourished and in no distress. Eyes: PERRLA, EOMs, conjunctiva no swelling or erythema. Sinuses: No frontal/maxillary tenderness ENT/Mouth: EAC's clear, TM's nl w/o erythema, bulging. Nares clear w/o erythema, swelling, exudates. Oropharynx clear without erythema or exudates. Oral hygiene is good. Tongue normal, non obstructing. Hearing intact.  Neck: Supple. Thyroid nl. Car 2+/2+ without bruits, nodes or JVD. Chest: Respirations nl with BS clear & equal w/o rales, rhonchi, wheezing or stridor.  Cor: Heart sounds normal w/ regular rate and rhythm without sig. murmurs, gallops, clicks, or rubs. Peripheral pulses normal and equal  without edema.  Abdomen: Soft & bowel sounds normal. Non-tender w/o guarding, rebound, hernias, masses, or organomegaly.  Lymphatics: Unremarkable.  Musculoskeletal: Full ROM all peripheral extremities, joint stability, 5/5 strength, and normal gait.  Skin: Warm, dry without exposed rashes, lesions or ecchymosis apparent.  Neuro: Cranial nerves  intact, reflexes equal bilaterally. Sensory-motor testing grossly intact. Tendon reflexes grossly intact.  Pysch: Alert & oriented x 3.  Insight and judgement nl & appropriate. No ideations.  Assessment and Plan:  1. Essential hypertension  - TSH  2. Mixed hyperlipidemia  - Lipid panel  3. Prediabetes  - Hemoglobin A1c - Insulin, random  4. Vitamin D deficiency  - Vit D  25 hydroxy   5. Hypothyroidism  - TSH  6. Rheumatoid arthritis   7. Encounter for long-term (current) use of medications  - CBC with Differential/Platelet - BASIC METABOLIC PANEL WITH GFR - Hepatic function panel - Magnesium   Recommended regular exercise, BP monitoring, weight control, and discussed med and SE's. Recommended labs to assess and monitor clinical  status. Further disposition pending results of labs. Over 30 minutes of exam, counseling, chart review was performed

## 2015-04-25 LAB — HEMOGLOBIN A1C
Hgb A1c MFr Bld: 5.6 % (ref <5.7)
MEAN PLASMA GLUCOSE: 114 mg/dL (ref <117)

## 2015-04-25 LAB — INSULIN, RANDOM: Insulin: 15.6 u[IU]/mL (ref 2.0–19.6)

## 2015-04-25 LAB — VITAMIN D 25 HYDROXY (VIT D DEFICIENCY, FRACTURES): Vit D, 25-Hydroxy: 37 ng/mL (ref 30–100)

## 2015-04-28 DIAGNOSIS — M25532 Pain in left wrist: Secondary | ICD-10-CM | POA: Diagnosis not present

## 2015-04-28 DIAGNOSIS — M0609 Rheumatoid arthritis without rheumatoid factor, multiple sites: Secondary | ICD-10-CM | POA: Diagnosis not present

## 2015-04-28 DIAGNOSIS — M797 Fibromyalgia: Secondary | ICD-10-CM | POA: Diagnosis not present

## 2015-05-11 ENCOUNTER — Other Ambulatory Visit: Payer: Self-pay | Admitting: Physician Assistant

## 2015-05-11 ENCOUNTER — Other Ambulatory Visit: Payer: Self-pay | Admitting: Internal Medicine

## 2015-05-11 DIAGNOSIS — R609 Edema, unspecified: Secondary | ICD-10-CM

## 2015-05-23 ENCOUNTER — Other Ambulatory Visit: Payer: Self-pay | Admitting: Physician Assistant

## 2015-06-05 DIAGNOSIS — M0609 Rheumatoid arthritis without rheumatoid factor, multiple sites: Secondary | ICD-10-CM | POA: Diagnosis not present

## 2015-06-05 DIAGNOSIS — L732 Hidradenitis suppurativa: Secondary | ICD-10-CM | POA: Diagnosis not present

## 2015-06-05 DIAGNOSIS — M797 Fibromyalgia: Secondary | ICD-10-CM | POA: Diagnosis not present

## 2015-06-05 DIAGNOSIS — J069 Acute upper respiratory infection, unspecified: Secondary | ICD-10-CM | POA: Diagnosis not present

## 2015-06-09 ENCOUNTER — Ambulatory Visit (INDEPENDENT_AMBULATORY_CARE_PROVIDER_SITE_OTHER): Payer: Medicare Other | Admitting: Internal Medicine

## 2015-06-09 ENCOUNTER — Other Ambulatory Visit: Payer: Self-pay | Admitting: Internal Medicine

## 2015-06-09 ENCOUNTER — Encounter: Payer: Self-pay | Admitting: Internal Medicine

## 2015-06-09 VITALS — BP 158/94 | HR 110 | Temp 98.2°F | Resp 18 | Ht 63.0 in

## 2015-06-09 DIAGNOSIS — J45901 Unspecified asthma with (acute) exacerbation: Secondary | ICD-10-CM | POA: Diagnosis not present

## 2015-06-09 DIAGNOSIS — J441 Chronic obstructive pulmonary disease with (acute) exacerbation: Secondary | ICD-10-CM | POA: Diagnosis not present

## 2015-06-09 MED ORDER — PROMETHAZINE-DM 6.25-15 MG/5ML PO SYRP: ORAL_SOLUTION | ORAL | Status: DC

## 2015-06-09 MED ORDER — PREDNISONE 20 MG PO TABS: ORAL_TABLET | ORAL | Status: DC

## 2015-06-09 MED ORDER — DOXYCYCLINE HYCLATE 100 MG PO CAPS: 100.0000 mg | ORAL_CAPSULE | Freq: Two times a day (BID) | ORAL | Status: DC

## 2015-06-09 MED ORDER — IPRATROPIUM-ALBUTEROL 0.5-2.5 (3) MG/3ML IN SOLN: 3.0000 mL | Freq: Four times a day (QID) | RESPIRATORY_TRACT | Status: DC | PRN

## 2015-06-09 NOTE — Progress Notes (Signed)
Patient ID: ANI DEOLIVEIRA, female   DOB: 01/01/1961, 54 y.o.   MRN: [id number]  HPI  Patient presents to the office for evaluation of cough.  It has been going on for 1 weeks.  Patient reports all the time, wet, yellow sputum production.  They also endorse change in voice, postnasal drip, shortness of breath, sputum production, wheezing and nasal congestion, rhinorrhea..  They have tried antitussives or decongestant, and sudafed.  They report that nothing has worked.  They admits to other sick contacts. She reports that her grandchildren who live with her also have similar symptoms.    Review of Systems  Constitutional: Negative for fever, chills and malaise/fatigue.  HENT: Positive for congestion. Negative for ear pain and sore throat.   Respiratory: Negative for cough, shortness of breath and wheezing.   Cardiovascular: Negative for chest pain, palpitations and leg swelling.  Gastrointestinal: Negative for heartburn, abdominal pain, diarrhea, constipation, blood in stool and melena.  Neurological: Positive for headaches.    PE:  Filed Vitals:   06/09/15 1505  BP: 158/94  Pulse: 110  Temp: 98.2 F (36.8 C)  Resp: 18     General:  Alert and non-toxic, WDWN, NAD HEENT: NCAT, PERLA, EOM normal, no occular discharge or erythema.  Nasal mucosal edema with sinus tenderness to palpation.  Oropharynx clear with minimal oropharyngeal edema and erythema.  Mucous membranes moist and pink. Neck:  Cervical adenopathy Chest:  RRR no MRGs.  Lungs clear to auscultation A&P with diffuse wheezing in all lung fields. Abdomen: +BS x 4 quadrants, soft, non-tender, no guarding, rigidity, or rebound. Skin: warm and dry no rash Neuro: A&Ox4, CN II-XII grossly intact  Assessment and Plan:   1. Asthma, chronic obstructive, with acute exacerbation (HCC) -antihistamine -anoro sample x 7 days - promethazine-dextromethorphan (PROMETHAZINE-DM) 6.25-15 MG/5ML syrup; Take 5 mL q6hrs prn for severe cough   Dispense: 360 mL; Refill: 1 - predniSONE (DELTASONE) 20 MG tablet; 3 tabs po daily x 3 days, then 2 tabs x 3 days, then 1.5 tabs x 3 days, then 1 tab x 3 days, then 0.5 tabs x 3 days  Dispense: 27 tablet; Refill: 0 - doxycycline (VIBRAMYCIN) 100 MG capsule; Take 1 capsule (100 mg total) by mouth 2 (two) times daily. One po bid x 7 days  Dispense: 14 capsule; Refill: 0

## 2015-06-09 NOTE — Patient Instructions (Signed)

## 2015-07-03 DIAGNOSIS — J45901 Unspecified asthma with (acute) exacerbation: Secondary | ICD-10-CM | POA: Diagnosis not present

## 2015-07-03 DIAGNOSIS — J441 Chronic obstructive pulmonary disease with (acute) exacerbation: Secondary | ICD-10-CM | POA: Diagnosis not present

## 2015-07-03 MED ORDER — IPRATROPIUM-ALBUTEROL 0.5-2.5 (3) MG/3ML IN SOLN
3.0000 mL | Freq: Once | RESPIRATORY_TRACT | Status: AC
  Administered 2015-07-03: 3 mL via RESPIRATORY_TRACT

## 2015-07-03 NOTE — Addendum Note (Signed)
Addended by: Dianah Pruett A on: 07/03/2015 03:15 PM   Modules accepted: Orders

## 2015-07-06 ENCOUNTER — Other Ambulatory Visit: Payer: Self-pay | Admitting: Internal Medicine

## 2015-07-29 ENCOUNTER — Ambulatory Visit: Payer: Self-pay | Admitting: Internal Medicine

## 2015-08-05 ENCOUNTER — Ambulatory Visit (INDEPENDENT_AMBULATORY_CARE_PROVIDER_SITE_OTHER): Payer: Medicare Other | Admitting: Internal Medicine

## 2015-08-05 ENCOUNTER — Encounter: Payer: Self-pay | Admitting: Internal Medicine

## 2015-08-05 VITALS — BP 116/64 | HR 102 | Temp 97.8°F | Resp 18 | Ht 63.0 in | Wt 197.0 lb

## 2015-08-05 DIAGNOSIS — R7303 Prediabetes: Secondary | ICD-10-CM

## 2015-08-05 DIAGNOSIS — Z79899 Other long term (current) drug therapy: Secondary | ICD-10-CM | POA: Diagnosis not present

## 2015-08-05 DIAGNOSIS — R300 Dysuria: Secondary | ICD-10-CM | POA: Diagnosis not present

## 2015-08-05 DIAGNOSIS — E782 Mixed hyperlipidemia: Secondary | ICD-10-CM

## 2015-08-05 DIAGNOSIS — E039 Hypothyroidism, unspecified: Secondary | ICD-10-CM | POA: Diagnosis not present

## 2015-08-05 DIAGNOSIS — R7309 Other abnormal glucose: Secondary | ICD-10-CM | POA: Diagnosis not present

## 2015-08-05 DIAGNOSIS — I10 Essential (primary) hypertension: Secondary | ICD-10-CM | POA: Diagnosis not present

## 2015-08-05 DIAGNOSIS — E559 Vitamin D deficiency, unspecified: Secondary | ICD-10-CM

## 2015-08-05 LAB — CBC WITH DIFFERENTIAL/PLATELET
BASOS ABS: 0 10*3/uL (ref 0.0–0.1)
Basophils Relative: 0 % (ref 0–1)
Eosinophils Absolute: 0 10*3/uL (ref 0.0–0.7)
Eosinophils Relative: 0 % (ref 0–5)
HEMATOCRIT: 43.7 % (ref 36.0–46.0)
HEMOGLOBIN: 14.6 g/dL (ref 12.0–15.0)
LYMPHS ABS: 3.7 10*3/uL (ref 0.7–4.0)
LYMPHS PCT: 47 % — AB (ref 12–46)
MCH: 29.4 pg (ref 26.0–34.0)
MCHC: 33.4 g/dL (ref 30.0–36.0)
MCV: 87.9 fL (ref 78.0–100.0)
MONOS PCT: 6 % (ref 3–12)
MPV: 9.9 fL (ref 8.6–12.4)
Monocytes Absolute: 0.5 10*3/uL (ref 0.1–1.0)
NEUTROS ABS: 3.7 10*3/uL (ref 1.7–7.7)
NEUTROS PCT: 47 % (ref 43–77)
Platelets: 325 10*3/uL (ref 150–400)
RBC: 4.97 MIL/uL (ref 3.87–5.11)
RDW: 14.4 % (ref 11.5–15.5)
WBC: 7.8 10*3/uL (ref 4.0–10.5)

## 2015-08-05 LAB — TSH: TSH: 0.376 u[IU]/mL (ref 0.350–4.500)

## 2015-08-05 NOTE — Progress Notes (Signed)
Patient ID: Autumn Frye, female   DOB: 09-20-1960, 54 y.o.   MRN: [id number]  Assessment and Plan:  Hypertension:  -Continue medication,  -monitor blood pressure at home.  -Continue DASH diet.   -Reminder to go to the ER if any CP, SOB, nausea, dizziness, severe HA, changes vision/speech, left arm numbness and tingling, and jaw pain.  Cholesterol: -Continue diet and exercise.  -Check cholesterol.   Pre-diabetes: -Continue diet and exercise.  -Check A1C  Vitamin D Def: -check level -continue medications.   Hypothyroidism -tsh -does not need cytomel -discussed weight loss  Dysuria -UA -Culture -if positive will call in abx  Continue diet and meds as discussed. Further disposition pending results of labs.  HPI 54 y.o. female  presents for 3 month follow up with hypertension, hyperlipidemia, prediabetes and vitamin D.   Her blood pressure has been controlled at home, today their BP is BP: 116/64 mmHg.   She does not workout. She denies chest pain, shortness of breath, dizziness.   She is not on cholesterol medication and denies myalgias. Her cholesterol is not at goal. The cholesterol last visit was:   Lab Results  Component Value Date   CHOL 269* 04/24/2015   HDL 49 04/24/2015   LDLCALC 179* 04/24/2015   TRIG 206* 04/24/2015   CHOLHDL 5.5* 04/24/2015     She has been working on diet and exercise for prediabetes, and denies foot ulcerations, hyperglycemia, hypoglycemia , increased appetite, nausea, paresthesia of the feet, polydipsia, polyuria, visual disturbances, vomiting and weight loss. Last A1C in the office was:  Lab Results  Component Value Date   HGBA1C 5.6 04/24/2015  She has been really watching her weight lately.  She has been working on portion sizes.    Patient is on Vitamin D supplement.  Lab Results  Component Value Date   VD25OH 37 04/24/2015      She reports that she did quit smoking in the last month.  She transitioned off of the cigarettes  and did an e cig for a little bit.  She then transitioned off the e cigs.  She would like to go on to another thyroid medication to help with her hypothyroid.  She reports that she would like to get cytomel.    Current Medications:  Current Outpatient Prescriptions on File Prior to Visit  Medication Sig Dispense Refill  . Adalimumab (HUMIRA [place]) Inject into the skin.    Marland Kitchen albuterol (PROVENTIL) (2.5 MG/3ML) 0.083% nebulizer solution Take 3 mLs (2.5 mg total) by nebulization every 6 (six) hours as needed for wheezing or shortness of breath. 60 vial 1  . amitriptyline (ELAVIL) 25 MG tablet Take 25 mg by mouth at bedtime. 1 po a.m. 3 po qhs    . atorvastatin (LIPITOR) 80 MG tablet Take 1 tablet (80 mg total) by mouth daily at 6 PM. 30 tablet 3  . AVENOVA 0.01 % SOLN Apply to eyelids BID  1  . buprenorphine (BUTRANS) 10 MCG/HR PTWK patch Place 10 mcg onto the skin once a week.    . Calcium Citrate-Vitamin D (CITRACAL + D PO) Take 1 tablet by mouth daily.     . CHANTIX 1 MG tablet TAKE 1 TABLET BY MOUTH TWICE DAILY 60 tablet 3  . Cholecalciferol (VITAMIN D3) 5000 UNITS TABS Take 5,000 mg by mouth daily.    Marland Kitchen doxycycline (VIBRAMYCIN) 50 MG capsule Take 50 mg by mouth daily.    . Estradiol Acetate 0.1 MG/24HR RING Insert vaginally every 3 months  1 each 3  . furosemide (LASIX) 40 MG tablet TAKE 1 TABLET BY MOUTH THREE TIMES DAILY FOR FLUID RETENTION 270 tablet 1  . LOTEMAX 0.5 % GEL Place 1 drop into both eyes 3 (three) times daily.   2  . Magnesium Oxide 500 MG TABS Take 500 mg by mouth daily.    Vladimir Faster Glycol-Propyl Glycol (SYSTANE) 0.4-0.3 % SOLN Apply 1 drop to eye 4 (four) times daily.    Marland Kitchen PREVIDENT 5000 SENSITIVE 1.1-5 % PSTE     . PROAIR HFA 108 (90 BASE) MCG/ACT inhaler Inhale 2 puffs into the lungs every 6 (six) hours as needed for wheezing or shortness of breath.     . ranitidine (ZANTAC) 300 MG tablet TAKE 1 TABLET(300 MG) BY MOUTH AT BEDTIME 30 tablet 3  . RESTASIS 0.05 % ophthalmic  emulsion   3  . SYNTHROID 150 MCG tablet TAKE 1 TABLET BY MOUTH ONCE DAILY 90 tablet PRN  . tamsulosin (FLOMAX) 0.4 MG CAPS capsule Take 0.4 mg by mouth daily.   11   No current facility-administered medications on file prior to visit.    Medical History:  Past Medical History  Diagnosis Date  . Cancer ([place])     thyroid-1995  . Hiatal hernia   . GERD (gastroesophageal reflux disease)   . Arthritis     OA, RA  . Asthma   . Hyperlipidemia   . Hypertension   . Hypothyroidism     Allergies:  Allergies  Allergen Reactions  . Hydrocodone Itching  . Morphine And Related Itching  . Compazine [Prochlorperazine Edisylate]   . Levaquin [Levofloxacin] Hives  . Lyrica [Pregabalin]     hallucinations  . Ciprofloxacin Hives and Swelling  . Penicillins Itching  . Sulfa Antibiotics Itching     Review of Systems:  Review of Systems  Constitutional: Negative for fever, chills and malaise/fatigue.  HENT: Negative for congestion, ear pain and sore throat.   Eyes: Negative.   Respiratory: Negative for cough, shortness of breath and wheezing.   Cardiovascular: Negative for chest pain, palpitations and leg swelling.  Gastrointestinal: Negative for heartburn, diarrhea, constipation, blood in stool and melena.  Genitourinary: Negative.   Skin: Negative.   Neurological: Negative for dizziness, sensory change, loss of consciousness and headaches.  Psychiatric/Behavioral: Negative for depression. The patient is not nervous/anxious and does not have insomnia.     Family history- Review and unchanged  Social history- Review and unchanged  Physical Exam: BP 116/64 mmHg  Pulse 102  Temp(Src) 97.8 F (36.6 C) (Temporal)  Resp 18  Ht 5\' 3"  (1.6 m)  Wt 197 lb (89.359 kg)  BMI 34.91 kg/m2  LMP  (LMP Unknown) Wt Readings from Last 3 Encounters:  08/05/15 197 lb (89.359 kg)  04/24/15 192 lb (87.091 kg)  02/28/15 193 lb (87.544 kg)    General Appearance: Well nourished well developed,  in no apparent distress. Eyes: PERRLA, EOMs, conjunctiva no swelling or erythema ENT/Mouth: Ear canals normal without obstruction, swelling, erythma, discharge.  TMs normal bilaterally.  Oropharynx moist, clear, without exudate, or postoropharyngeal swelling. Neck: Supple, thyroid normal,no cervical adenopathy  Respiratory: Respiratory effort normal, Breath sounds clear A&P without rhonchi, or rale.  No retractions, no accessory usage.  End expiratory wheeze present Cardio: RRR with no MRGs. Brisk peripheral pulses without edema.  Abdomen: Soft, + BS,  Non tender, no guarding, rebound, hernias, masses. Musculoskeletal: Full ROM, 5/5 strength, Normal gait Skin: Warm, dry without rashes, lesions, ecchymosis.  Neuro: Awake and  oriented X 3, Cranial nerves intact. Normal muscle tone, no cerebellar symptoms. Psych: Normal affect, Insight and Judgment appropriate.    Starlyn Skeans, PA-C 4:22 PM [facility]

## 2015-08-06 LAB — LIPID PANEL
Cholesterol: 221 mg/dL — ABNORMAL HIGH (ref 125–200)
HDL: 55 mg/dL (ref >=46)
LDL Cholesterol: 131 mg/dL — ABNORMAL HIGH (ref <130)
Total CHOL/HDL Ratio: 4 Ratio (ref <=5.0)
Triglycerides: 174 mg/dL — ABNORMAL HIGH (ref <150)
VLDL: 35 mg/dL — ABNORMAL HIGH (ref <30)

## 2015-08-06 LAB — URINALYSIS, MICROSCOPIC ONLY
Bacteria, UA: NONE SEEN [HPF]
CASTS: NONE SEEN [LPF]
Crystals: NONE SEEN [HPF]
SQUAMOUS EPITHELIAL / LPF: NONE SEEN [HPF] (ref <=5)
WBC, UA: NONE SEEN WBC/HPF (ref <=5)
YEAST: NONE SEEN [HPF]

## 2015-08-06 LAB — URINALYSIS, ROUTINE W REFLEX MICROSCOPIC
Bilirubin Urine: NEGATIVE
Glucose, UA: NEGATIVE
KETONES UR: NEGATIVE
LEUKOCYTES UA: NEGATIVE
NITRITE: NEGATIVE
PH: 5.5 (ref 5.0–8.0)
Specific Gravity, Urine: 1.017 (ref 1.001–1.035)

## 2015-08-06 LAB — HEPATIC FUNCTION PANEL
ALBUMIN: 4.4 g/dL (ref 3.6–5.1)
ALT: 35 U/L — AB (ref 6–29)
AST: 27 U/L (ref 10–35)
Alkaline Phosphatase: 65 U/L (ref 33–130)
Bilirubin, Direct: <0.1 mg/dL (ref <=0.2)
Total Bilirubin: 0.3 mg/dL (ref 0.2–1.2)
Total Protein: 6.9 g/dL (ref 6.1–8.1)

## 2015-08-06 LAB — BASIC METABOLIC PANEL WITH GFR
BUN: 11 mg/dL (ref 7–25)
CHLORIDE: 101 mmol/L (ref 98–110)
CO2: 28 mmol/L (ref 20–31)
CREATININE: 0.66 mg/dL (ref 0.50–1.05)
Calcium: 9.6 mg/dL (ref 8.6–10.4)
GFR, Est African American: >89 mL/min (ref >=60)
GFR, Est Non African American: >89 mL/min (ref >=60)
Glucose, Bld: 96 mg/dL (ref 65–99)
Potassium: 4.5 mmol/L (ref 3.5–5.3)
Sodium: 136 mmol/L (ref 135–146)

## 2015-08-06 LAB — HEMOGLOBIN A1C
Hgb A1c MFr Bld: 5.5 % (ref <5.7)
Mean Plasma Glucose: 111 mg/dL (ref <117)

## 2015-08-07 LAB — URINE CULTURE: Colony Count: 50000

## 2015-08-18 ENCOUNTER — Other Ambulatory Visit: Payer: Self-pay | Admitting: Internal Medicine

## 2015-08-19 ENCOUNTER — Other Ambulatory Visit: Payer: Self-pay | Admitting: *Deleted

## 2015-08-19 MED ORDER — ESTRADIOL ACETATE 0.1 MG/24HR VA RING: VAGINAL_RING | VAGINAL | Status: DC

## 2015-08-20 DIAGNOSIS — M79671 Pain in right foot: Secondary | ICD-10-CM | POA: Diagnosis not present

## 2015-08-20 DIAGNOSIS — M79672 Pain in left foot: Secondary | ICD-10-CM | POA: Diagnosis not present

## 2015-08-20 DIAGNOSIS — M06042 Rheumatoid arthritis without rheumatoid factor, left hand: Secondary | ICD-10-CM | POA: Diagnosis not present

## 2015-08-20 DIAGNOSIS — M06041 Rheumatoid arthritis without rheumatoid factor, right hand: Secondary | ICD-10-CM | POA: Diagnosis not present

## 2015-08-20 DIAGNOSIS — M25572 Pain in left ankle and joints of left foot: Secondary | ICD-10-CM | POA: Diagnosis not present

## 2015-08-20 DIAGNOSIS — M797 Fibromyalgia: Secondary | ICD-10-CM | POA: Diagnosis not present

## 2015-08-20 DIAGNOSIS — M25571 Pain in right ankle and joints of right foot: Secondary | ICD-10-CM | POA: Diagnosis not present

## 2015-11-14 ENCOUNTER — Ambulatory Visit (INDEPENDENT_AMBULATORY_CARE_PROVIDER_SITE_OTHER): Payer: Medicare Other | Admitting: Internal Medicine

## 2015-11-14 ENCOUNTER — Encounter: Payer: Self-pay | Admitting: Internal Medicine

## 2015-11-14 VITALS — BP 110/78 | HR 76 | Temp 97.3°F | Resp 16 | Ht 63.5 in | Wt 199.8 lb

## 2015-11-14 DIAGNOSIS — E039 Hypothyroidism, unspecified: Secondary | ICD-10-CM | POA: Diagnosis not present

## 2015-11-14 DIAGNOSIS — R6889 Other general symptoms and signs: Secondary | ICD-10-CM

## 2015-11-14 DIAGNOSIS — M0579 Rheumatoid arthritis with rheumatoid factor of multiple sites without organ or systems involvement: Secondary | ICD-10-CM | POA: Insufficient documentation

## 2015-11-14 DIAGNOSIS — M069 Rheumatoid arthritis, unspecified: Secondary | ICD-10-CM | POA: Diagnosis not present

## 2015-11-14 DIAGNOSIS — Z79899 Other long term (current) drug therapy: Secondary | ICD-10-CM

## 2015-11-14 DIAGNOSIS — Z0001 Encounter for general adult medical examination with abnormal findings: Secondary | ICD-10-CM | POA: Diagnosis not present

## 2015-11-14 DIAGNOSIS — I10 Essential (primary) hypertension: Secondary | ICD-10-CM | POA: Diagnosis not present

## 2015-11-14 DIAGNOSIS — E559 Vitamin D deficiency, unspecified: Secondary | ICD-10-CM | POA: Diagnosis not present

## 2015-11-14 DIAGNOSIS — R7303 Prediabetes: Secondary | ICD-10-CM | POA: Diagnosis not present

## 2015-11-14 DIAGNOSIS — R7309 Other abnormal glucose: Secondary | ICD-10-CM | POA: Diagnosis not present

## 2015-11-14 DIAGNOSIS — E782 Mixed hyperlipidemia: Secondary | ICD-10-CM | POA: Diagnosis not present

## 2015-11-14 DIAGNOSIS — Z0000 Encounter for general adult medical examination without abnormal findings: Secondary | ICD-10-CM | POA: Diagnosis not present

## 2015-11-14 DIAGNOSIS — Z1212 Encounter for screening for malignant neoplasm of rectum: Secondary | ICD-10-CM

## 2015-11-14 LAB — CBC WITH DIFFERENTIAL/PLATELET
BASOS PCT: 0 % (ref 0–1)
Basophils Absolute: 0 10*3/uL (ref 0.0–0.1)
Eosinophils Absolute: 0.1 10*3/uL (ref 0.0–0.7)
Eosinophils Relative: 1 % (ref 0–5)
HEMATOCRIT: 41 % (ref 36.0–46.0)
HEMOGLOBIN: 13.6 g/dL (ref 12.0–15.0)
LYMPHS PCT: 61 % — AB (ref 12–46)
Lymphs Abs: 3.9 10*3/uL (ref 0.7–4.0)
MCH: 29.1 pg (ref 26.0–34.0)
MCHC: 33.2 g/dL (ref 30.0–36.0)
MCV: 87.8 fL (ref 78.0–100.0)
MPV: 9.8 fL (ref 8.6–12.4)
Monocytes Absolute: 0.4 10*3/uL (ref 0.1–1.0)
Monocytes Relative: 7 % (ref 3–12)
NEUTROS ABS: 2 10*3/uL (ref 1.7–7.7)
NEUTROS PCT: 31 % — AB (ref 43–77)
Platelets: 312 10*3/uL (ref 150–400)
RBC: 4.67 MIL/uL (ref 3.87–5.11)
RDW: 14.6 % (ref 11.5–15.5)
WBC: 6.4 10*3/uL (ref 4.0–10.5)

## 2015-11-14 LAB — LIPID PANEL
CHOL/HDL RATIO: 3.6 ratio (ref <=5.0)
CHOLESTEROL: 201 mg/dL — AB (ref 125–200)
HDL: 56 mg/dL (ref >=46)
LDL CALC: 117 mg/dL (ref <130)
Triglycerides: 139 mg/dL (ref <150)
VLDL: 28 mg/dL (ref <30)

## 2015-11-14 LAB — BASIC METABOLIC PANEL WITH GFR
BUN: 11 mg/dL (ref 7–25)
CALCIUM: 9.1 mg/dL (ref 8.6–10.4)
CHLORIDE: 103 mmol/L (ref 98–110)
CO2: 27 mmol/L (ref 20–31)
Creat: 0.66 mg/dL (ref 0.50–1.05)
GFR, Est African American: >89 mL/min (ref >=60)
GLUCOSE: 84 mg/dL (ref 65–99)
POTASSIUM: 4.4 mmol/L (ref 3.5–5.3)
SODIUM: 136 mmol/L (ref 135–146)

## 2015-11-14 LAB — MAGNESIUM: Magnesium: 1.9 mg/dL (ref 1.5–2.5)

## 2015-11-14 LAB — HEPATIC FUNCTION PANEL
ALK PHOS: 57 U/L (ref 33–130)
ALT: 29 U/L (ref 6–29)
AST: 23 U/L (ref 10–35)
Albumin: 3.8 g/dL (ref 3.6–5.1)
BILIRUBIN DIRECT: 0.1 mg/dL (ref <=0.2)
BILIRUBIN INDIRECT: 0.3 mg/dL (ref 0.2–1.2)
TOTAL PROTEIN: 6.4 g/dL (ref 6.1–8.1)
Total Bilirubin: 0.4 mg/dL (ref 0.2–1.2)

## 2015-11-14 LAB — TSH: TSH: 0.55 mIU/L

## 2015-11-14 LAB — URIC ACID: Uric Acid, Serum: 5.6 mg/dL (ref 2.4–7.0)

## 2015-11-14 NOTE — Patient Instructions (Signed)
Recommend Adult Low Dose Aspirin or   coated  Aspirin 81 mg daily   To reduce risk of Colon Cancer 20 %,   Skin Cancer 26 % ,   Melanoma 46%   and   Pancreatic cancer 60%   ++++++++++++++++++++++++++++++++++++++++++++++++++++++ Vitamin D goal   is between 70-100.   Please make sure that you are taking your Vitamin D as directed.   It is very important as a natural anti-inflammatory   helping hair, skin, and nails, as well as reducing stroke and heart attack risk.   It helps your bones and helps with mood.  It also decreases numerous cancer risks so please take it as directed.   Low Vit D is associated with a 200-300% higher risk for CANCER   and 200-300% higher risk for HEART   ATTACK  &  STROKE.   .....................................Marland Kitchen  It is also associated with higher death rate at younger ages,   autoimmune diseases like Rheumatoid arthritis, Lupus, Multiple Sclerosis.     Also many other serious conditions, like depression, Alzheimer's  Dementia, infertility, muscle aches, fatigue, fibromyalgia - just to name a few.  ++++++++++++++++++++++++++++++++++++++++++++++++  Recommend the book "The END of DIETING" by Autumn Frye   & the book "The END of DIABETES " by Autumn Frye  At [email or web address] - get book & Audio CD's     Being diabetic has a  300% increased risk for heart attack, stroke, cancer, and alzheimer- type vascular dementia. It is very important that you work harder with diet by avoiding all foods that are white. Avoid white rice (brown & wild rice is OK), white potatoes (sweetpotatoes in moderation is OK), White bread or wheat bread or anything made out of white flour like bagels, donuts, rolls, buns, biscuits, cakes, pastries, cookies, pizza crust, and pasta (made from white flour & egg whites) - vegetarian pasta or spinach or wheat pasta is OK. Multigrain breads like Arnold's or Pepperidge [employer], or multigrain sandwich thins or flatbreads.  Diet,  exercise and weight loss can reverse and cure diabetes in the early stages.  Diet, exercise and weight loss is very important in the control and prevention of complications of diabetes which affects every system in your body, ie. Brain - dementia/stroke, eyes - glaucoma/blindness, heart - heart attack/heart failure, kidneys - dialysis, stomach - gastric paralysis, intestines - malabsorption, nerves - severe painful neuritis, circulation - gangrene & loss of a leg(s), and finally cancer and Alzheimers.    I recommend avoid fried & greasy foods,  sweets/candy, white rice (brown or wild rice or Quinoa is OK), white potatoes (sweet potatoes are OK) - anything made from white flour - bagels, doughnuts, rolls, buns, biscuits,white and wheat breads, pizza crust and traditional pasta made of white flour & egg white(vegetarian pasta or spinach or wheat pasta is OK).  Multi-grain bread is OK - like multi-grain flat bread or sandwich thins. Avoid alcohol in excess. Exercise is also important.    Eat all the vegetables you want - avoid meat, especially red meat and dairy - especially cheese.  Cheese is the most concentrated form of trans-fats which is the worst thing to clog up our arteries. Veggie cheese is OK which can be found in the fresh produce section at [employer] or [employer] or [employer]  ++++++++++++++++++++++++++++++++++++++++++++++++++ DASH Eating Plan  DASH stands for "Dietary Approaches to Stop Hypertension."   The DASH eating plan is a healthy eating plan that has been shown to reduce high blood  pressure (hypertension). Additional health benefits may include reducing the risk of type 2 diabetes mellitus, heart disease, and stroke. The DASH eating plan may also help with weight loss.  WHAT DO I NEED TO KNOW ABOUT THE DASH EATING PLAN?  For the DASH eating plan, you will follow these general guidelines:  Choose foods with a percent daily value for sodium of less than 5% (as listed on the food  label).  Use salt-free seasonings or herbs instead of table salt or sea salt.  Check with your health care provider or pharmacist before using salt substitutes.  Eat lower-sodium products, often labeled as "lower sodium" or "no salt added."  Eat fresh foods.  Eat more vegetables, fruits, and low-fat dairy products.    Choose whole grains. Look for the word "whole" as the first word in the ingredient list.  Choose fish   Limit sweets, desserts, sugars, and sugary drinks.  Choose heart-healthy fats.  Eat veggie cheese   Eat more home-cooked food and less restaurant, buffet, and fast food.  Limit fried foods.  [place]uffaker foods using methods other than frying.  Limit canned vegetables. If you do use them, rinse them well to decrease the sodium.  When eating at a restaurant, ask that your food be prepared with less salt, or no salt if possible.                      WHAT FOODS CAN I EAT?  Read Autumn Frye's books on The End of Dieting & The End of Diabetes  Grains  Whole grain or whole wheat bread. Brown rice. Whole grain or whole wheat pasta. Quinoa, bulgur, and whole grain cereals. Low-sodium cereals. Corn or whole wheat flour tortillas. Whole grain cornbread. Whole grain crackers. Low-sodium crackers.  Vegetables  Fresh or frozen vegetables (raw, steamed, roasted, or grilled). Low-sodium or reduced-sodium tomato and vegetable juices. Low-sodium or reduced-sodium tomato sauce and paste. Low-sodium or reduced-sodium canned vegetables.   Fruits  All fresh, canned (in natural juice), or frozen fruits.  Protein Products   All fish and seafood.  Dried beans, peas, or lentils. Unsalted nuts and seeds. Unsalted canned beans.  Dairy  Low-fat dairy products, such as skim or 1% milk, 2% or reduced-fat cheeses, low-fat ricotta or cottage cheese, or plain low-fat yogurt. Low-sodium or reduced-sodium cheeses.  Fats and Oils  Tub margarines without trans fats. Light or  reduced-fat mayonnaise and salad dressings (reduced sodium). Avocado. Safflower, olive, or canola oils. Natural peanut or almond butter.  Other  Unsalted popcorn and pretzels. The items listed above may not be a complete list of recommended foods or beverages. Contact your dietitian for more options.  +++++++++++++++++++++++++++++++++++++++++++  WHAT FOODS ARE NOT RECOMMENDED?  Grains/ White flour or wheat flour  White bread. White pasta. White rice. Refined cornbread. Bagels and croissants. Crackers that contain trans fat.  Vegetables  Creamed or fried vegetables. Vegetables in a . Regular canned vegetables. Regular canned tomato sauce and paste. Regular tomato and vegetable juices.  Fruits  Dried fruits. Canned fruit in light or heavy syrup. Fruit juice.  Meat and Other Protein Products  Meat in general - RED mwaet & White meat.  Fatty cuts of meat. Ribs, chicken wings, bacon, sausage, bologna, salami, chitterlings, fatback, hot dogs, bratwurst, and packaged luncheon meats.  Dairy  Whole or 2% milk, cream, half-and-half, and cream cheese. Whole-fat or sweetened yogurt. Full-fat cheeses or blue cheese. Nondairy creamers and whipped toppings. Processed cheese, cheese spreads, or  cheese curds.  Condiments  Onion and garlic salt, seasoned salt, table salt, and sea salt. Canned and packaged gravies. Worcestershire sauce. Tartar sauce. Barbecue sauce. Teriyaki sauce. Soy sauce, including reduced sodium. Steak sauce. Fish sauce. Oyster sauce. Cocktail sauce. Horseradish. Ketchup and mustard. Meat flavorings and tenderizers. Bouillon cubes. Hot sauce. Tabasco sauce. Marinades. Taco seasonings. Relishes.  Fats and Oils Butter, stick margarine, lard, shortening and bacon fat. Coconut, palm kernel, or palm oils. Regular salad dressings.  Pickles and olives. Salted popcorn and pretzels.  The items listed above may not be a complete list of foods and beverages to avoid.   Preventive  Care for Adults  A healthy lifestyle and preventive care can promote health and wellness. Preventive health guidelines for women include the following key practices.  A routine yearly physical is a good way to check with your health care provider about your health and preventive screening. It is a chance to share any concerns and updates on your health and to receive a thorough exam.  Visit your dentist for a routine exam and preventive care every 6 months. Brush your teeth twice a day and floss once a day. Good oral hygiene prevents tooth decay and gum disease.  The frequency of eye exams is based on your age, health, family medical history, use of contact lenses, and other factors. Follow your health care provider's recommendations for frequency of eye exams.  Eat a healthy diet. Foods like vegetables, fruits, whole grains, low-fat dairy products, and lean protein foods contain the nutrients you need without too many calories. Decrease your intake of foods high in solid fats, added sugars, and salt. Eat the right amount of calories for you.Get information about a proper diet from your health care provider, if necessary.  Regular physical exercise is one of the most important things you can do for your health. Most adults should get at least 150 minutes of moderate-intensity exercise (any activity that increases your heart rate and causes you to sweat) each week. In addition, most adults need muscle-strengthening exercises on 2 or more days a week.  Maintain a healthy weight. The body mass index (BMI) is a screening tool to identify possible weight problems. It provides an estimate of body fat based on height and weight. Your health care provider can find your BMI and can help you achieve or maintain a healthy weight.For adults 20 years and older:  A BMI below 18.5 is considered underweight.  A BMI of 18.5 to 24.9 is normal.  A BMI of 25 to 29.9 is considered overweight.  A BMI of 30 and  above is considered obese.  Maintain normal blood lipids and cholesterol levels by exercising and minimizing your intake of saturated fat. Eat a balanced diet with plenty of fruit and vegetables. Blood tests for lipids and cholesterol should begin at age 21 and be repeated every 5 years. If your lipid or cholesterol levels are high, you are over 50, or you are at high risk for heart disease, you may need your cholesterol levels checked more frequently.Ongoing high lipid and cholesterol levels should be treated with medicines if diet and exercise are not working.  If you smoke, find out from your health care provider how to quit. If you do not use tobacco, do not start.  Lung cancer screening is recommended for adults aged 31-80 years who are at high risk for developing lung cancer because of a history of smoking. A yearly low-dose CT scan of the lungs is recommended  for people who have at least a 30-pack-year history of smoking and are a current smoker or have quit within the past 15 years. A pack year of smoking is smoking an average of 1 pack of cigarettes a day for 1 year (for example: 1 pack a day for 30 years or 2 packs a day for 15 years). Yearly screening should continue until the smoker has stopped smoking for at least 15 years. Yearly screening should be stopped for people who develop a health problem that would prevent them from having lung cancer treatment.  High blood pressure causes heart disease and increases the risk of stroke. Your blood pressure should be checked at least every 1 to 2 years. Ongoing high blood pressure should be treated with medicines if weight loss and exercise do not work.  If you are 30-80 years old, ask your health care provider if you should take aspirin to prevent strokes.  Diabetes screening involves taking a blood sample to check your fasting blood sugar level. This should be done once every 3 years, after age 61, if you are within normal weight and without risk  factors for diabetes. Testing should be considered at a younger age or be carried out more frequently if you are overweight and have at least 1 risk factor for diabetes.  Breast cancer screening is essential preventive care for women. You should practice "breast self-awareness." This means understanding the normal appearance and feel of your breasts and may include breast self-examination. Any changes detected, no matter how small, should be reported to a health care provider. Women in their 77s and 30s should have a clinical breast exam (CBE) by a health care provider as part of a regular health exam every 1 to 3 years. After age 81, women should have a CBE every year. Starting at age 20, women should consider having a mammogram (breast X-ray test) every year. Women who have a family history of breast cancer should talk to their health care provider about genetic screening. Women at a high risk of breast cancer should talk to their health care providers about having an MRI and a mammogram every year.  Breast cancer gene (BRCA)-related cancer risk assessment is recommended for women who have family members with BRCA-related cancers. BRCA-related cancers include breast, ovarian, tubal, and peritoneal cancers. Having family members with these cancers may be associated with an increased risk for harmful changes (mutations) in the breast cancer genes BRCA1 and BRCA2. Results of the assessment will determine the need for genetic counseling and BRCA1 and BRCA2 testing.  Routine pelvic exams to screen for cancer are no longer recommended for nonpregnant women who are considered low risk for cancer of the pelvic organs (ovaries, uterus, and vagina) and who do not have symptoms. Ask your health care provider if a screening pelvic exam is right for you.  If you have had past treatment for cervical cancer or a condition that could lead to cancer, you need Pap tests and screening for cancer for at least 20 years after  your treatment. If Pap tests have been discontinued, your risk factors (such as having a new sexual partner) need to be reassessed to determine if screening should be resumed. Some women have medical problems that increase the chance of getting cervical cancer. In these cases, your health care provider may recommend more frequent screening and Pap tests.  Colorectal cancer can be detected and often prevented. Most routine colorectal cancer screening begins at the age of 22 years and  continues through age 74 years. However, your health care provider may recommend screening at an earlier age if you have risk factors for colon cancer. On a yearly basis, your health care provider may provide home test kits to check for hidden blood in the stool. Use of a small camera at the end of a tube, to directly examine the colon (sigmoidoscopy or colonoscopy), can detect the earliest forms of colorectal cancer. Talk to your health care provider about this at age 22, when routine screening begins. Direct exam of the colon should be repeated every 5-10 years through age 76 years, unless early forms of pre-cancerous polyps or small growths are found.  Hepatitis C blood testing is recommended for all people born from [birth year] through 1965 and any individual with known risks for hepatitis C.  Pra  Osteoporosis is a disease in which the bones lose minerals and strength with aging. This can result in serious bone fractures or breaks. The risk of osteoporosis can be identified using a bone density scan. Women ages 88 years and over and women at risk for fractures or osteoporosis should discuss screening with their health care providers. Ask your health care provider whether you should take a calcium supplement or vitamin D to reduce the rate of osteoporosis.  Menopause can be associated with physical symptoms and risks. Hormone replacement therapy is available to decrease symptoms and risks. You should talk to your health care  provider about whether hormone replacement therapy is right for you.  Use sunscreen. Apply sunscreen liberally and repeatedly throughout the day. You should seek shade when your shadow is shorter than you. Protect yourself by wearing long sleeves, pants, a wide-brimmed hat, and sunglasses year round, whenever you are outdoors.  Once a month, do a whole body skin exam, using a mirror to look at the skin on your back. Tell your health care provider of new moles, moles that have irregular borders, moles that are larger than a pencil eraser, or moles that have changed in shape or color.  Stay current with required vaccines (immunizations).  Influenza vaccine. All adults should be immunized every year.  Tetanus, diphtheria, and acellular pertussis (Td, Tdap) vaccine. Pregnant women should receive 1 dose of Tdap vaccine during each pregnancy. The dose should be obtained regardless of the length of time since the last dose. Immunization is preferred during the 27th-36th week of gestation. An adult who has not previously received Tdap or who does not know her vaccine status should receive 1 dose of Tdap. This initial dose should be followed by tetanus and diphtheria toxoids (Td) booster doses every 10 years. Adults with an unknown or incomplete history of completing a 3-dose immunization series with Td-containing vaccines should begin or complete a primary immunization series including a Tdap dose. Adults should receive a Td booster every 10 years.  Varicella vaccine. An adult without evidence of immunity to varicella should receive 2 doses or a second dose if she has previously received 1 dose. Pregnant females who do not have evidence of immunity should receive the first dose after pregnancy. This first dose should be obtained before leaving the health care facility. The second dose should be obtained 4-8 weeks after the first dose.  Human papillomavirus (HPV) vaccine. Females aged 13-26 years who have not  received the vaccine previously should obtain the 3-dose series. The vaccine is not recommended for use in pregnant females. However, pregnancy testing is not needed before receiving a dose. If a female is found to  be pregnant after receiving a dose, no treatment is needed. In that case, the remaining doses should be delayed until after the pregnancy. Immunization is recommended for any person with an immunocompromised condition through the age of 14 years if she did not get any or all doses earlier. During the 3-dose series, the second dose should be obtained 4-8 weeks after the first dose. The third dose should be obtained 24 weeks after the first dose and 16 weeks after the second dose.  Zoster vaccine. One dose is recommended for adults aged 87 years or older unless certain conditions are present.  Measles, mumps, and rubella (MMR) vaccine. Adults born before [birth year] generally are considered immune to measles and mumps. Adults born in [birth year] or later should have 1 or more doses of MMR vaccine unless there is a contraindication to the vaccine or there is laboratory evidence of immunity to each of the three diseases. A routine second dose of MMR vaccine should be obtained at least 28 days after the first dose for students attending postsecondary schools, health care workers, or international travelers. People who received inactivated measles vaccine or an unknown type of measles vaccine during 1963-1967 should receive 2 doses of MMR vaccine. People who received inactivated mumps vaccine or an unknown type of mumps vaccine before 1979 and are at high risk for mumps infection should consider immunization with 2 doses of MMR vaccine. For females of childbearing age, rubella immunity should be determined. If there is no evidence of immunity, females who are not pregnant should be vaccinated. If there is no evidence of immunity, females who are pregnant should delay immunization until after pregnancy. Unvaccinated  health care workers born before [birth year] who lack laboratory evidence of measles, mumps, or rubella immunity or laboratory confirmation of disease should consider measles and mumps immunization with 2 doses of MMR vaccine or rubella immunization with 1 dose of MMR vaccine.  Pneumococcal 13-valent conjugate (PCV13) vaccine. When indicated, a person who is uncertain of her immunization history and has no record of immunization should receive the PCV13 vaccine. An adult aged 48 years or older who has certain medical conditions and has not been previously immunized should receive 1 dose of PCV13 vaccine. This PCV13 should be followed with a dose of pneumococcal polysaccharide (PPSV23) vaccine. The PPSV23 vaccine dose should be obtained at least 8 weeks after the dose of PCV13 vaccine. An adult aged 40 years or older who has certain medical conditions and previously received 1 or more doses of PPSV23 vaccine should receive 1 dose of PCV13. The PCV13 vaccine dose should be obtained 1 or more years after the last PPSV23 vaccine dose.    Pneumococcal polysaccharide (PPSV23) vaccine. When PCV13 is also indicated, PCV13 should be obtained first. All adults aged 66 years and older should be immunized. An adult younger than age 28 years who has certain medical conditions should be immunized. Any person who resides in a nursing home or long-term care facility should be immunized. An adult smoker should be immunized. People with an immunocompromised condition and certain other conditions should receive both PCV13 and PPSV23 vaccines. People with human immunodeficiency virus (HIV) infection should be immunized as soon as possible after diagnosis. Immunization during chemotherapy or radiation therapy should be avoided. Routine use of PPSV23 vaccine is not recommended for American Indians, [place] Natives, or people younger than 65 years unless there are medical conditions that require PPSV23 vaccine. When indicated, people who  have unknown immunization and have no record of  immunization should receive PPSV23 vaccine. One-time revaccination 5 years after the first dose of PPSV23 is recommended for people aged 19-64 years who have chronic kidney failure, nephrotic syndrome, asplenia, or immunocompromised conditions. People who received 1-2 doses of PPSV23 before age 15 years should receive another dose of PPSV23 vaccine at age 40 years or later if at least 5 years have passed since the previous dose. Doses of PPSV23 are not needed for people immunized with PPSV23 at or after age 68 years.  Preventive Services / Frequency   Ages 67 to 44 years  Blood pressure check.  Lipid and cholesterol check.  Lung cancer screening. / Every year if you are aged 37-80 years and have a 30-pack-year history of smoking and currently smoke or have quit within the past 15 years. Yearly screening is stopped once you have quit smoking for at least 15 years or develop a health problem that would prevent you from having lung cancer treatment.  Clinical breast exam.** / Every year after age 56 years.  BRCA-related cancer risk assessment.** / For women who have family members with a BRCA-related cancer (breast, ovarian, tubal, or peritoneal cancers).  Mammogram.** / Every year beginning at age 45 years and continuing for as long as you are in good health. Consult with your health care provider.  Pap test.** / Every 3 years starting at age 57 years through age 39 or 44 years with a history of 3 consecutive normal Pap tests.  HPV screening.** / Every 3 years from ages 39 years through ages 21 to 65 years with a history of 3 consecutive normal Pap tests.  Fecal occult blood test (FOBT) of stool. / Every year beginning at age 80 years and continuing until age 60 years. You may not need to do this test if you get a colonoscopy every 10 years.  Flexible sigmoidoscopy or colonoscopy.** / Every 5 years for a flexible sigmoidoscopy or every 10 years  for a colonoscopy beginning at age 44 years and continuing until age 71 years.  Hepatitis C blood test.** / For all people born from [birth year] through 1965 and any individual with known risks for hepatitis C.  Skin self-exam. / Monthly.  Influenza vaccine. / Every year.  Tetanus, diphtheria, and acellular pertussis (Tdap/Td) vaccine.** / Consult your health care provider. Pregnant women should receive 1 dose of Tdap vaccine during each pregnancy. 1 dose of Td every 10 years.  Varicella vaccine.** / Consult your health care provider. Pregnant females who do not have evidence of immunity should receive the first dose after pregnancy.  Zoster vaccine.** / 1 dose for adults aged 30 years or older.  Pneumococcal 13-valent conjugate (PCV13) vaccine.** / Consult your health care provider.  Pneumococcal polysaccharide (PPSV23) vaccine.** / 1 to 2 doses if you smoke cigarettes or if you have certain conditions.  Meningococcal vaccine.** / Consult your health care provider.  Hepatitis A vaccine.** / Consult your health care provider.  Hepatitis B vaccine.** / Consult your health care provider. Screening for abdominal aortic aneurysm (AAA)  by ultrasound is recommended for people over 50 who have history of high blood pressure or who are current or former smokers.

## 2015-11-14 NOTE — Progress Notes (Signed)
Patient ID: Autumn Frye, female   DOB: 07/13/1961, 55 y.o.   MRN: [id number]  Annual Screening/Preventative Visit And Comprehensive Evaluation &  Examination  This very nice 55 y.o. MWF presents for a Wellness/Preventative Visit & comprehensive evaluation and management of multiple medical co-morbidities.  Patient has been followed for HTN, T2_NIDDM  Prediabetes, Hyperlipidemia and Vitamin D Deficiency. Patient also has Rheumatoid Arthritidis dx'd in 2009. Patient c/o pains in her Hands, Hips,Left Knee  and ankles.    HTN predates since 2000. Patient's BP has been controlled at home and patient denies any cardiac symptoms as chest pain, palpitations, shortness of breath, dizziness or ankle swelling. Today's BP: 110/78 mmHg    Patient's hyperlipidemia is not controlled controlled with diet and medications. Patient denies myalgias or other medication SE's. Last lipids were not at goal with Cholesterol 221*; HDL 55; LDL 131*; Triglycerides 174 on 08/05/2015.   Patient has Morbid Obesity (BMI 34.8+) and consequent  prediabetes predating since Dec 2009 with A1c 5.9%  and patient denies reactive hypoglycemic symptoms, visual blurring, diabetic polys, or paresthesias. Last A1c was 5.5% on 08/05/2015.   Finally, patient has history of Vitamin D Deficiency of "76" in 2009 and last Vitamin D was still very low at 37 on 04/24/2015.   Medication Sig  . albuterol   neb soln Take 3 mLs (2.5 mg total) by nebulization every 6 (six) hours as needed for wheezing or shortness of breath.  Marland Kitchen amitriptyline 25 MG tablet Take 25 mg by mouth at bedtime. 1 po a.m. 3 po qhs  . atorvastatin 80 MG tablet TAKE 1 TABLET(80 MG) BY MOUTH DAILY AT 6 PM  . AVENOVA 0.01 % SOLN Apply to eyelids BID  . BUTRANS 10 MCG/HR patch Place 10 mcg onto the skin once a week.  . Calcium Citrate-Vit D  Take 1 tablet by mouth daily.   Marland Kitchen VITAMIN D 5000 UNITS  Take 5,000 mg by mouth daily.  . Doxycycline  50 MG  Take 50 mg by mouth daily.  .  Estradiol  RING Insert vaginally every 3 months  . furosemide (LASIX) 40 MG  TAKE 1 TABLET BY MOUTH THREE TIMES DAILY FOR FLUID RETENTION  . LOTEMAX 0.5 % GEL Place 1 drop into both eyes 3 (three) times daily.   . Magnesium 500 MG TABS Take 500 mg by mouth daily.  Carren Rang Apply 1 drop to eye 4 (four) times daily.  Marland Kitchen PREVIDENT    . PROAIR HFA inhaler Inhale 2 puffs into the lungs every 6 (six) hours as needed for wheezing or shortness of breath.   . RESTASIS  ophth   . SYNTHROID 150 MCG tablet TAKE 1 TABLET BY MOUTH ONCE DAILY   Allergies  Allergen Reactions  . Hydrocodone Itching  . Morphine And Related Itching  . Compazine [Prochlorperazine Edisylate]   . Levaquin [Levofloxacin] Hives  . Lyrica [Pregabalin]     hallucinations  . Ciprofloxacin Hives and Swelling  . Penicillins Itching  . Sulfa Antibiotics Itching   Past Medical History  Diagnosis Date  . Cancer ([place])     thyroid-1995  . Hiatal hernia   . GERD (gastroesophageal reflux disease)   . Arthritis     OA, RA  . Asthma   . Hyperlipidemia   . Hypertension   . Hypothyroidism    Health Maintenance  Topic Date Due  . TETANUS/TDAP  04/03/1980  . PAP SMEAR  04/03/1982  . INFLUENZA VACCINE  04/07/2015  . MAMMOGRAM  10/19/2015  .  COLONOSCOPY  10/12/2021  . Hepatitis C Screening  Completed  . HIV Screening  Completed   Immunization History  Administered Date(s) Administered  . DTaP 09/15/2012, 09/15/2012  . Influenza Whole 09/06/2012  . Influenza-Unspecified 05/07/2014  . Pneumococcal Conjugate-13 09/15/2012, 07/05/2014  . Zoster 09/03/2013   Past Surgical History  Procedure Laterality Date  . Thyroidectomy    . Upper gastrointestinal endoscopy    . Total knee arthroplasty      right  . Vaginal hysterectomy    . Knee surgery      3 on right knee, 1 on left-prior to TKR  . Pilonidal cyst excision    . Tonsillectomy    . Anterior lat lumbar fusion Left 01/26/2013    Procedure: ANTERIOR LATERAL LUMBAR  FUSION 1 LEVEL;  Surgeon: Faythe Ghee, MD;  Location: [facility];  Service: Neurosurgery;  Laterality: Left;  lumbar four-five  . Lumbar percutaneous pedicle screw 1 level Left 01/26/2013    Procedure: LUMBAR PERCUTANEOUS PEDICLE SCREW 1 LEVEL;  Surgeon: Faythe Ghee, MD;  Location: [facility];  Service: Neurosurgery;  Laterality: Left;  lumbar four-five   Family History  Problem Relation Age of Onset  . Colon cancer Neg Hx   . Esophageal cancer Neg Hx   . Stomach cancer Neg Hx   . Rectal cancer Neg Hx   . Hypertension Father   . Stroke Father   . Diabetes Brother   . Asthma Brother   . Other Daughter     XED CONNECTIVE TISSUE  . Cancer Maternal Grandmother     BREAST   Social History  Substance Use Topics  . Smoking status: Former Smoker -- 0.50 packs/day for 40 years    Types: Cigarettes    Quit date: 07/05/2015  . Smokeless tobacco: None  . Alcohol Use: No    ROS Constitutional: Denies fever, chills, weight loss/gain, headaches, insomnia,  night sweats, and change in appetite. Does c/o fatigue. Eyes: Denies redness, blurred vision, diplopia, discharge, itchy, watery eyes.  ENT: Denies discharge, congestion, post nasal drip, epistaxis, sore throat, earache, hearing loss, dental pain, Tinnitus, Vertigo, Sinus pain, snoring.  Cardio: Denies chest pain, palpitations, irregular heartbeat, syncope, dyspnea, diaphoresis, orthopnea, PND, claudication, edema Respiratory: denies cough, dyspnea, DOE, pleurisy, hoarseness, laryngitis, wheezing.  Gastrointestinal: Denies dysphagia, heartburn, reflux, water brash, pain, cramps, nausea, vomiting, bloating, diarrhea, constipation, hematemesis, melena, hematochezia, jaundice, hemorrhoids Genitourinary: Denies dysuria, frequency, urgency, nocturia, hesitancy, discharge, hematuria, flank pain Breast: Breast lumps, nipple discharge, bleeding.  Musculoskeletal: Denies arthralgia, myalgia, stiffness, Jt. Swelling, pain, limp, and  strain/sprain. Denies falls. Skin: Denies puritis, rash, hives, warts, acne, eczema, changing in skin lesion Neuro: No weakness, tremor, incoordination, spasms, paresthesia, pain Psychiatric: Denies confusion, memory loss, sensory loss. Denies Depression. Endocrine: Denies change in weight, skin, hair change, nocturia, and paresthesia, diabetic polys, visual blurring, hyper / hypo glycemic episodes.  Heme/Lymph: No excessive bleeding, bruising, enlarged lymph nodes.  Physical Exam  BP 110/78 mmHg  Pulse 76  Temp(Src) 97.3 F (36.3 C)  Resp 16  Ht 5' 3.5" (1.613 m)  Wt 199 lb 12.8 oz (90.629 kg)  BMI 34.83 kg/m2  LMP  (LMP Unknown)  General Appearance: Over nourished with central obesity and in no apparent distress. Eyes: PERRLA, EOMs, conjunctiva no swelling or erythema, normal fundi and vessels. Sinuses: No frontal/maxillary tenderness ENT/Mouth: EACs patent / TMs  nl. Nares clear without erythema, swelling, mucoid exudates. Oral hygiene is good. No erythema, swelling, or exudate. Tongue normal, non-obstructing. Tonsils  not swollen or erythematous. Hearing normal.  Neck: Supple, thyroid normal. No bruits, nodes or JVD. Respiratory: Respiratory effort normal.  BS equal and clear bilateral without rales, rhonci, wheezing or stridor. Cardio: Heart sounds are normal with regular rate and rhythm and no murmurs, rubs or gallops. Peripheral pulses are normal and equal bilaterally without edema. No aortic or femoral bruits. Chest: symmetric with normal excursions and percussion. Breasts: Symmetric, without lumps, nipple discharge, retractions, or fibrocystic changes.  Abdomen: Rotund & soft, with bowl sounds. Nontender, no guarding, rebound, hernias, masses, or organomegaly.  Lymphatics: Non tender without lymphadenopathy.  Musculoskeletal: Full ROM all peripheral extremities, joint stability, 5/5 strength, and normal gait. Skin: Warm and dry without rashes, lesions, cyanosis, clubbing or   ecchymosis.  Neuro: Cranial nerves intact, reflexes equal bilaterally. Normal muscle tone, no cerebellar symptoms. Sensation intact.  Pysch: Alert and oriented X 3, normal affect, Insight and Judgment appropriate.   Assessment and Plan  1. Annual Preventative Screening Examination  - HUMIRA PEN 40 MG/0.8ML PNKT;   - Microalbumin / creatinine urine ratio - EKG 12-Lead - [place], RETROPERITNL ABD,  LTD - POC Hemoccult Bld/Stl  - Urinalysis, Routine w reflex microscopic  - Uric acid - CBC with Differential/Platelet - BASIC METABOLIC PANEL WITH GFR - Hepatic function panel - Magnesium - Lipid panel - TSH - Hemoglobin A1c - Insulin, random - VITAMIN D 25 Hydroxy   2. Essential hypertension  - Microalbumin / creatinine urine ratio - EKG 12-Lead - [place], RETROPERITNL ABD,  LTD - TSH  3. Mixed hyperlipidemia  - Lipid panel - TSH  4. Prediabetes  - Hemoglobin A1c - Insulin, random  5. Vitamin D deficiency disease  - VITAMIN D 25 Hydroxy   6. Hypothyroidism  - TSH  7. Rheumatoid arthriti ([place])   8. Screening for rectal cancer  - POC Hemoccult Bld/Stl   9. Medication management  - tamsulosin (FLOMAX) 0.4 MG CAPS capsule; TK 1 C PO D; Refill: 11 - Urinalysis, Routine w reflex microscopic  - Uric acid - CBC with Differential/Platelet - BASIC METABOLIC PANEL WITH GFR - Hepatic function panel - Magnesium   Continue prudent diet as discussed, weight control, BP monitoring, regular exercise, and medications. Discussed med's effects and SE's. Screening labs and tests as requested with regular follow-up as recommended. Over 40 minutes of exam, counseling, chart review and high complex critical decision making was performed.

## 2015-11-15 ENCOUNTER — Encounter: Payer: Self-pay | Admitting: Internal Medicine

## 2015-11-15 LAB — URINALYSIS, ROUTINE W REFLEX MICROSCOPIC
BILIRUBIN URINE: NEGATIVE
Glucose, UA: NEGATIVE
Hgb urine dipstick: NEGATIVE
KETONES UR: NEGATIVE
Leukocytes, UA: NEGATIVE
NITRITE: NEGATIVE
PH: 6.5 (ref 5.0–8.0)
Protein, ur: NEGATIVE
Specific Gravity, Urine: 1.009 (ref 1.001–1.035)

## 2015-11-15 LAB — MICROALBUMIN / CREATININE URINE RATIO
Creatinine, Urine: 22 mg/dL (ref 20–320)
MICROALB/CREAT RATIO: 73 ug/mg{creat} — AB (ref <30)
Microalb, Ur: 1.6 mg/dL

## 2015-11-15 LAB — HEMOGLOBIN A1C
HEMOGLOBIN A1C: 5.7 % — AB (ref <5.7)
MEAN PLASMA GLUCOSE: 117 mg/dL — AB (ref <117)

## 2015-11-15 LAB — VITAMIN D 25 HYDROXY (VIT D DEFICIENCY, FRACTURES): Vit D, 25-Hydroxy: 39 ng/mL (ref 30–100)

## 2015-11-17 ENCOUNTER — Other Ambulatory Visit: Payer: Self-pay

## 2015-11-17 DIAGNOSIS — Z1231 Encounter for screening mammogram for malignant neoplasm of breast: Secondary | ICD-10-CM

## 2015-11-17 LAB — INSULIN, RANDOM: Insulin: 6 u[IU]/mL (ref 2.0–19.6)

## 2015-11-24 ENCOUNTER — Ambulatory Visit
Admission: RE | Admit: 2015-11-24 | Discharge: 2015-11-24 | Disposition: A | Discharge status: Home / Self Care | Payer: Medicare Other | Source: Ambulatory Visit

## 2015-11-24 DIAGNOSIS — Z1231 Encounter for screening mammogram for malignant neoplasm of breast: Secondary | ICD-10-CM | POA: Diagnosis not present

## 2015-12-03 DIAGNOSIS — M797 Fibromyalgia: Secondary | ICD-10-CM | POA: Diagnosis not present

## 2015-12-03 DIAGNOSIS — M25562 Pain in left knee: Secondary | ICD-10-CM | POA: Diagnosis not present

## 2015-12-03 DIAGNOSIS — J069 Acute upper respiratory infection, unspecified: Secondary | ICD-10-CM | POA: Diagnosis not present

## 2015-12-03 DIAGNOSIS — M0609 Rheumatoid arthritis without rheumatoid factor, multiple sites: Secondary | ICD-10-CM | POA: Diagnosis not present

## 2015-12-03 DIAGNOSIS — M25572 Pain in left ankle and joints of left foot: Secondary | ICD-10-CM | POA: Diagnosis not present

## 2015-12-12 DIAGNOSIS — M25562 Pain in left knee: Secondary | ICD-10-CM | POA: Diagnosis not present

## 2015-12-12 DIAGNOSIS — M1732 Unilateral post-traumatic osteoarthritis, left knee: Secondary | ICD-10-CM | POA: Diagnosis not present

## 2015-12-12 DIAGNOSIS — M12569 Traumatic arthropathy, unspecified knee: Secondary | ICD-10-CM | POA: Insufficient documentation

## 2015-12-15 ENCOUNTER — Encounter: Payer: Self-pay | Admitting: Physician Assistant

## 2015-12-15 ENCOUNTER — Ambulatory Visit (INDEPENDENT_AMBULATORY_CARE_PROVIDER_SITE_OTHER): Payer: Medicare Other | Admitting: Physician Assistant

## 2015-12-15 VITALS — BP 140/80 | HR 93 | Temp 97.5°F | Resp 14 | Ht 63.5 in | Wt 197.8 lb

## 2015-12-15 DIAGNOSIS — R6889 Other general symptoms and signs: Secondary | ICD-10-CM | POA: Diagnosis not present

## 2015-12-15 DIAGNOSIS — M5137 Other intervertebral disc degeneration, lumbosacral region: Secondary | ICD-10-CM

## 2015-12-15 DIAGNOSIS — M069 Rheumatoid arthritis, unspecified: Secondary | ICD-10-CM

## 2015-12-15 DIAGNOSIS — Z0000 Encounter for general adult medical examination without abnormal findings: Secondary | ICD-10-CM

## 2015-12-15 DIAGNOSIS — E559 Vitamin D deficiency, unspecified: Secondary | ICD-10-CM | POA: Diagnosis not present

## 2015-12-15 DIAGNOSIS — R7303 Prediabetes: Secondary | ICD-10-CM

## 2015-12-15 DIAGNOSIS — Z0001 Encounter for general adult medical examination with abnormal findings: Secondary | ICD-10-CM

## 2015-12-15 DIAGNOSIS — E039 Hypothyroidism, unspecified: Secondary | ICD-10-CM

## 2015-12-15 DIAGNOSIS — J45909 Unspecified asthma, uncomplicated: Secondary | ICD-10-CM

## 2015-12-15 DIAGNOSIS — J0100 Acute maxillary sinusitis, unspecified: Secondary | ICD-10-CM

## 2015-12-15 DIAGNOSIS — E782 Mixed hyperlipidemia: Secondary | ICD-10-CM

## 2015-12-15 DIAGNOSIS — M3500 Sicca syndrome, unspecified: Secondary | ICD-10-CM | POA: Diagnosis not present

## 2015-12-15 DIAGNOSIS — Z79899 Other long term (current) drug therapy: Secondary | ICD-10-CM | POA: Diagnosis not present

## 2015-12-15 DIAGNOSIS — L732 Hidradenitis suppurativa: Secondary | ICD-10-CM

## 2015-12-15 DIAGNOSIS — M1990 Unspecified osteoarthritis, unspecified site: Secondary | ICD-10-CM | POA: Diagnosis not present

## 2015-12-15 DIAGNOSIS — I10 Essential (primary) hypertension: Secondary | ICD-10-CM | POA: Diagnosis not present

## 2015-12-15 LAB — POC INFLUENZA A&B (BINAX/QUICKVUE)
Influenza A, POC: NEGATIVE
Influenza B, POC: NEGATIVE

## 2015-12-15 MED ORDER — AZITHROMYCIN 250 MG PO TABS: ORAL_TABLET | ORAL | Status: DC

## 2015-12-15 MED ORDER — PREDNISONE 20 MG PO TABS: ORAL_TABLET | ORAL | Status: DC

## 2015-12-15 NOTE — Progress Notes (Signed)
Patient ID: Autumn Frye, female   DOB: [birth year], 55 y.o.   MRN: [id number]  MEDICARE ANNUAL WELLNESS VISIT AND ACUTE VISIT  Assessment:   1. Essential hypertension - continue medications, DASH diet, exercise and monitor at home. Call if greater than 130/80.  - Microalbumin / creatinine urine ratio - EKG 12-Lead - [place], RETROPERITNL ABD,  LTD  2. Mixed hyperlipidemia - Lipid panel  3. Prediabetes - Hemoglobin A1c - Insulin, fasting  4. Vitamin D deficiency - Vit D  25 hydroxy (rtn osteoporosis monitoring)  5. Hypothyroidism, unspecified hypothyroidism type Hypothyroidism-check TSH level, continue medications the same, reminded to take on an empty stomach 30-41mins before food.   6. Sjogren's disease On DMARD  7. Rheumatoid arthritis Continue follow up, on DMARD  8. Intrinsic asthma, unspecified asthma severity, uncomplicated  9. Encounter for long-term (current) use of medications  10. Medicare annual wellness visit, subsequent  11. Degeneration of intervertebral disc of lumbosacral region RICE, NSAIDS, exercises given, if not better get xray and PT referral or ortho referral.   12. Axillary hidradenitis suppurativa Continue PRN  13. Osteoarthritis, unspecified osteoarthritis type, unspecified site Continue follow up for RA  14. Acute maxillary sinusitis, recurrence not specified - POC Influenza A&B(BINAX/QUICKVUE)- NEGATIVE - Will hold the zpak and take if she is not getting better, increase fluids, rest, cont allergy pill - she is on DMARD, will treat more quickly - predniSONE (DELTASONE) 20 MG tablet; 2 tablets daily for 3 days, 1 tablet daily for 4 days.  Dispense: 10 tablet; Refill: 0 - azithromycin (ZITHROMAX) 250 MG tablet; Take 2 tablets (500 mg) on  Day 1,  followed by 1 tablet (250 mg) once daily on Days 2 through 5.  Dispense: 6 each; Refill: 1     Plan:   During the course of the visit the patient was educated and counseled about appropriate  screening and preventive services including:    Pneumococcal vaccine   Influenza vaccine  Td vaccine  Screening electrocardiogram  Bone densitometry screening  Colorectal cancer screening  Diabetes screening  Glaucoma screening  Nutrition counseling   Advanced directives: requested  Conditions/risks identified: BMI: Discussed weight loss, diet, and increase physical activity.  Increase physical activity: AHA recommends 150 minutes of physical activity a week.  Medications reviewed RA on DMARD PreDiabetes is at goal, ACE/ARB therapy: No Indicated Urinary Incontinence is not an issue: discussed non pharmacology and pharmacology options.  Fall risk: low- discussed PT, home fall assessment, medications.   Subjective:   Autumn Frye is a 55 y.o. female who presents for her 1st  Medicare Annual Wellness Visit and acute visit.   She states that on Saturday had sudden on set of chills, fever, sore throat, cough with yellow mucus, fatigue  .Has been taking care of her dad who has lung cancer. Granddaughter is sick.    She has had elevated blood pressure since 2000. Her blood pressure has been controlled at home, & today their BP is BP: 140/80 mmHg   She was dx'd in 2009 with Rheumatoid Arthritis - treated with DMARD's and is followed very closely by Dr Tobie Lords.   She does not workout. She denies chest pain, shortness of breath, dizziness.   She is sporadically on cholesterol medication and denies myalgias. Her cholesterol is not at goal. The cholesterol last visit was:  Lab Results  Component Value Date   CHOL 201* 11/14/2015   HDL 56 11/14/2015   LDLCALC 117 11/14/2015   TRIG 139 11/14/2015  CHOLHDL 3.6 11/14/2015   She has had Prediabetes for 5 years (2010 with A1c 5.9% in Dec 2010). She alleges that she  been working on diet and exercise for prediabetes and denies foot ulcerations, hyperglycemia, hypoglycemia , paresthesia of the feet, polydipsia, polyuria,  visual disturbances and weight loss. Last A1C in the office was:  Lab Results  Component Value Date   HGBA1C 5.7* 11/14/2015   Patient is on Vitamin D supplement.  (Vit D 27 in 2009) Lab Results  Component Value Date   VD25OH 39 11/14/2015     She is on thyroid medication. Her medication was not changed last visit.   Lab Results  Component Value Date   TSH 0.55 11/14/2015  .   Names of Other Physician/Practitioners you currently use: 1. [facility] here for primary care 2. Dr Sherral Hammers, eye doctor, last visit Jan  2017 3. Dr Raiford Noble, dentist, every 6 months  Patient Care Team: Unk Pinto, MD as PCP - General (Internal Medicine) Ladene Artist, MD as Consulting Physician (Gastroenterology) Newt Minion, MD as Consulting Physician (Orthopedic Surgery) Unice Bailey, MD as Consulting Physician (Rheumatology) Karie Chimera, MD as Consulting Physician (Neurosurgery) Druscilla Brownie, MD as Consulting Physician (Dermatology) Druscilla Brownie, MD as Consulting Physician (Dermatology)  Medication Review:   Medication List       This list is accurate as of: 12/15/15 12:01 PM.  Always use your most recent med list.               amitriptyline 25 MG tablet  Commonly known as:  ELAVIL  Take 25 mg by mouth at bedtime. 1 po a.m. 3 po qhs     atorvastatin 80 MG tablet  Commonly known as:  LIPITOR  TAKE 1 TABLET(80 MG) BY MOUTH DAILY AT 6 PM     AVENOVA 0.01 % Soln  Apply to eyelids BID     AVENOVA 0.01 % Soln     BUTRANS 10 MCG/HR Ptwk patch  Generic drug:  buprenorphine  Place 10 mcg onto the skin once a week.     chlorhexidine 0.12 % solution  Commonly known as:  PERIDEX     CITRACAL + D PO  Take 1 tablet by mouth daily.     clindamycin 150 MG capsule  Commonly known as:  CLEOCIN     clindamycin 150 MG capsule  Commonly known as:  CLEOCIN  TK 4 CS PO ONE HOUR BEFORE DENTAL APPOINTMENT     Estradiol Acetate 0.1  MG/24HR Ring  Insert vaginally every 3 months     furosemide 40 MG tablet  Commonly known as:  LASIX  TAKE 1 TABLET BY MOUTH THREE TIMES DAILY FOR FLUID RETENTION     HUMIRA PEN 40 MG/0.8ML Pnkt  Generic drug:  Adalimumab     HUMIRA PEN 40 MG/0.8ML Pnkt  Generic drug:  Adalimumab     LOTEMAX 0.5 % Gel  Generic drug:  Loteprednol Etabonate  Place 1 drop into both eyes 3 (three) times daily.     LOTEMAX 0.5 % Gel  Generic drug:  Loteprednol Etabonate  Apply to eye.     Magnesium Oxide 500 MG Tabs  Take 500 mg by mouth daily.     PREVIDENT 5000 SENSITIVE 1.1-5 % Pste  Generic drug:  Sod Fluoride-Potassium Nitrate     PREVIDENT 5000 SENSITIVE 1.1-5 % Pste  Generic drug:  Sod Fluoride-Potassium Nitrate     PROAIR HFA 108 (90 Base) MCG/ACT inhaler  Generic drug:  albuterol  Inhale into the lungs.     PROAIR HFA 108 (90 Base) MCG/ACT inhaler  Generic drug:  albuterol  Inhale 2 puffs into the lungs every 6 (six) hours as needed for wheezing or shortness of breath.     albuterol (2.5 MG/3ML) 0.083% nebulizer solution  Commonly known as:  PROVENTIL  Take 3 mLs (2.5 mg total) by nebulization every 6 (six) hours as needed for wheezing or shortness of breath.     RESTASIS 0.05 % ophthalmic emulsion  Generic drug:  cycloSPORINE     RESTASIS 0.05 % ophthalmic emulsion  Generic drug:  cycloSPORINE     SYNTHROID 150 MCG tablet  Generic drug:  levothyroxine  TAKE 1 TABLET BY MOUTH ONCE DAILY     SYNTHROID 150 MCG tablet  Generic drug:  levothyroxine     SYSTANE 0.4-0.3 % Soln  Generic drug:  Polyethyl Glycol-Propyl Glycol  Apply 1 drop to eye 4 (four) times daily.     tamsulosin 0.4 MG Caps capsule  Commonly known as:  FLOMAX  TK 1 C PO D     Vitamin D3 5000 units Tabs  Take 5,000 mg by mouth daily.        Current Problems (verified) Patient Active Problem List   Diagnosis Date Noted  . Arthropathy, traumatic, knee 12/12/2015  . Rheumatoid arthritis ([place])  11/14/2015  . Medicare annual wellness visit, subsequent 04/24/2015  . Axillary hidradenitis suppurativa 07/05/2014  . Sjogren's disease ([place]) 07/05/2014  . Medication management 03/26/2014  . Mixed hyperlipidemia 09/24/2013  . Prediabetes 09/24/2013  . Vitamin D deficiency 09/24/2013  . Intrinsic asthma   . Hypertension   . Hypothyroidism   . Arthritis, degenerative 05/23/2008  . Degeneration of intervertebral disc of lumbosacral region 05/23/2008   Screening Tests Immunization History  Administered Date(s) Administered  . DTaP 09/15/2012, 09/15/2012  . Influenza Whole 09/06/2012  . Influenza-Unspecified 05/07/2014  . Pneumococcal Conjugate-13 09/15/2012, 07/05/2014  . Zoster 09/03/2013   Preventative care: Last colonoscopy: 10/13/2011 MGM 11/2015 CXr 11/2014 DEXA 09/09/2011 Echo 2004  Prior vaccinations: Tdap: 09/15/2012  Influenza: 06/2015  Pneumococcal: 09/15/2012 Prevnar: 07/05/2014 Shingles/Zostavax: 09/03/2013  Allergies Allergies  Allergen Reactions  . Hydrocodone Itching  . Morphine And Related Itching  . Sulfa Antibiotics Itching    Other reaction(s): Other (See Comments)  . Compazine [Prochlorperazine Edisylate]   . Levaquin [Levofloxacin] Hives  . Lyrica [Pregabalin]     Other reaction(s): Other (See Comments) hallucinations hallucinations  . Ciprofloxacin Hives and Swelling  . Penicillins Itching   Surgical history Past Surgical History  Procedure Laterality Date  . Thyroidectomy    . Upper gastrointestinal endoscopy    . Total knee arthroplasty      right  . Vaginal hysterectomy    . Knee surgery      3 on right knee, 1 on left-prior to TKR  . Pilonidal cyst excision    . Tonsillectomy    . Anterior lat lumbar fusion Left 01/26/2013    Procedure: ANTERIOR LATERAL LUMBAR FUSION 1 LEVEL;  Surgeon: Faythe Ghee, MD;  Location: [facility];  Service: Neurosurgery;  Laterality: Left;  lumbar four-five  . Lumbar percutaneous pedicle screw 1  level Left 01/26/2013    Procedure: LUMBAR PERCUTANEOUS PEDICLE SCREW 1 LEVEL;  Surgeon: Faythe Ghee, MD;  Location: [facility];  Service: Neurosurgery;  Laterality: Left;  lumbar four-five   Family history Family History  Problem Relation Age of Onset  . Colon cancer Neg  Hx   . Esophageal cancer Neg Hx   . Stomach cancer Neg Hx   . Rectal cancer Neg Hx   . Hypertension Father   . Stroke Father   . Diabetes Brother   . Asthma Brother   . Other Daughter     XED CONNECTIVE TISSUE  . Cancer Maternal Grandmother     BREAST    Risk Factors: Tobacco Social History  Substance Use Topics  . Smoking status: Former Smoker -- 0.50 packs/day for 40 years    Types: Cigarettes    Quit date: 07/05/2015  . Smokeless tobacco: None  . Alcohol Use: No   She has quit smoking in the last 3 months.   Alcohol Current alcohol use: none  Caffeine Current caffeine use: coffee 2 cups /day  Exercise Current exercise: none  Nutrition/Diet Current diet: in general, a "healthy" diet    Cardiac risk factors: dyslipidemia, hypertension, sedentary lifestyle and smoking/ tobacco exposure.  Depression Screen  Q1: Over the past two weeks, have you felt down, depressed or hopeless? No  Q2: Over the past two weeks, have you felt little interest or pleasure in doing things? No  Have you lost interest or pleasure in daily life? No  Do you often feel hopeless? No  Do you cry easily over simple problems? No  Activities of Daily Living In your present state of health, do you have any difficulty performing the following activities?:  Driving? No Managing money?  No Feeding yourself? No Getting from bed to chair? No Climbing a flight of stairs? No Preparing food and eating?: No Bathing or showering? No Getting dressed: No Getting to the toilet? No Using the toilet:No Moving around from place to place: No In the past year have you fallen or had a near fall?:No  Vision  Difficulties: No  Hearing Difficulties: No Do you often ask people to speak up or repeat themselves? No Do you experience ringing or noises in your ears? No Do you have difficulty understanding soft or whispered voices? Sometimes.  Cognition  Do you feel that you have a problem with memory?No  Do you often misplace items? No  Do you feel safe at home?  Yes  Advanced directives Does patient have a [place]? No- offered forms Does patient have a Living Will? No - offered forms   Objective:     Blood pressure 140/80, pulse 93, temperature 97.5 F (36.4 C), temperature source Temporal, resp. rate 14, height 5' 3.5" (1.613 m), weight 197 lb 12.8 oz (89.721 kg), SpO2 98 %.  General appearance: alert, no distress, WD/WN, female  Cognitive Testing  Alert? Yes  Normal Appearance?Yes  Oriented to person? Yes  Place? Yes   Time? Yes  Recall of three objects?  Yes  Can perform simple calculations? Yes  Displays appropriate judgment? Yes  Can read the correct time from a watch/clock?Yes  HEENT: normocephalic, sclerae anicteric, TMs pearly, nares patent, + maxillary sinus tenderness, no discharge or erythema, pharynx with erythema but no exudate Oral cavity: MMM, no lesions Neck: supple, no lymphadenopathy, no thyromegaly, no masses Heart: RRR, normal S1, S2, no murmurs Lungs: CTA bilaterally, no wheezes, rhonchi, or rales Abdomen: +bs, soft, non tender, non distended, no masses, no hepatomegaly, no splenomegaly Musculoskeletal: nontender, no swelling, no obvious deformity Extremities: no edema, no cyanosis, no clubbing Pulses: 2+ symmetric, upper and lower extremities, normal cap refill Neurological: alert, oriented x 3, CN2-12 intact, strength normal upper extremities and lower extremities, sensation  normal throughout, DTRs 2+ throughout, no cerebellar signs, gait normal Psychiatric: normal affect, behavior normal, pleasant   Medicare Attestation I have  personally reviewed: The patient's medical and social history Their use of alcohol, tobacco or illicit drugs Their current medications and supplements The patient's functional ability including ADLs,fall risks, home safety risks, cognitive, and hearing and visual impairment Diet and physical activities Evidence for depression or mood disorders  The patient's weight, height, BMI, and visual acuity have been recorded in the chart.  I have made referrals, counseling, and provided education to the patient based on review of the above and I have provided the patient with a written personalized care plan for preventive services.    Vicie Mutters, PA-C   12/15/2015

## 2015-12-15 NOTE — Patient Instructions (Signed)
Please take the prednisone to help decrease inflammation and therefore decrease symptoms. Take it it with food to avoid GI upset. It can cause increased energy but on the other hand it can make it hard to sleep at night so please take it AT NIGHT WITH DINNER, it takes 8-12 hours to start working so it will NOT affect your sleeping if you take it at night with your food!!  If you are diabetic it will increase your sugars so decrease carbs and monitor your sugars closely.    HOW TO TREAT VIRAL COUGH AND COLD SYMPTOMS:  -Symptoms usually last at least 1 week with the worst symptoms being around day 4.  - colds usually start with a sore throat and end with a cough, and the cough can take 2 weeks to get better.  -No antibiotics are needed for colds, flu, sore throats, cough, bronchitis UNLESS symptoms are longer than 7 days OR if you are getting better then get drastically worse.  -There are a lot of combination medications (Dayquil, Nyquil, Vicks 44, tyelnol cold and sinus, ETC). Please look at the ingredients on the back so that you are treating the correct symptoms and not doubling up on medications/ingredients.    Medicines you can use  Nasal congestion  - pseudoephedrine (Sudafed)- behind the counter, do not use if you have high blood pressure, medicine that have -D in them.  - phenylephrine (Sudafed PE) -Dextormethorphan + chlorpheniramine (Coridcidin HBP)- okay if you have high blood pressure -Oxymetazoline (Afrin) nasal spray- LIMIT to 3 days -Saline nasal spray -Neti pot (used distilled or bottled water)  Ear pain/congestion  -pseudoephedrine (sudafed) - Nasonex/flonase nasal spray  Fever  -Acetaminophen (Tyelnol) -Ibuprofen (Advil, motrin, aleve)  Sore Throat  -Acetaminophen (Tyelnol) -Ibuprofen (Advil, motrin, aleve) -Drink a lot of water -Gargle with salt water - Rest your voice (don't talk) -Throat sprays -Cough drops  Body Aches  -Acetaminophen (Tyelnol) -Ibuprofen  (Advil, motrin, aleve)  Headache  -Acetaminophen (Tyelnol) -Ibuprofen (Advil, motrin, aleve) - Exedrin, Exedrin Migraine  Allergy symptoms (cough, sneeze, runny nose, itchy eyes) -Claritin or loratadine cheapest but likely the weakest  -Zyrtec or certizine at night because it can make you sleepy -The strongest is allegra or fexafinadine  Cheapest at [employer], [employer], [employer]  Cough  -Dextromethorphan (Delsym)- medicine that has DM in it -Guafenesin (Mucinex/Robitussin) - cough drops - drink lots of water  Chest Congestion  -Guafenesin (Mucinex/Robitussin)  Red Itchy Eyes  - Naphcon-A  Upset Stomach  - Bland diet (nothing spicy, greasy, fried, and high acid foods like tomatoes, oranges, berries) -OKAY- cereal, bread, soup, crackers, rice -Eat smaller more frequent meals -reduce caffeine, no alcohol -Loperamide (Imodium-AD) if diarrhea -Prevacid for heart burn  General health when sick  -Hydration -wash your hands frequently -keep surfaces clean -change pillow cases and sheets often -Get fresh air but do not exercise strenuously -Vitamin D, double up on it - Vitamin C -Zinc       

## 2015-12-31 ENCOUNTER — Other Ambulatory Visit: Payer: Self-pay | Admitting: Physician Assistant

## 2016-01-29 DIAGNOSIS — M1732 Unilateral post-traumatic osteoarthritis, left knee: Secondary | ICD-10-CM | POA: Diagnosis not present

## 2016-01-29 DIAGNOSIS — M06062 Rheumatoid arthritis without rheumatoid factor, left knee: Secondary | ICD-10-CM | POA: Diagnosis not present

## 2016-02-04 ENCOUNTER — Ambulatory Visit (INDEPENDENT_AMBULATORY_CARE_PROVIDER_SITE_OTHER): Payer: Medicare Other | Admitting: Internal Medicine

## 2016-02-04 ENCOUNTER — Encounter: Payer: Self-pay | Admitting: Internal Medicine

## 2016-02-04 VITALS — BP 122/74 | HR 96 | Temp 98.2°F | Resp 18 | Ht 63.5 in | Wt 200.0 lb

## 2016-02-04 DIAGNOSIS — M179 Osteoarthritis of knee, unspecified: Secondary | ICD-10-CM | POA: Diagnosis not present

## 2016-02-04 NOTE — Progress Notes (Signed)
Assessment and Plan:   1. Unilateral osteoarthritis of knee, unspecified osteoarthritis type -cleared for surgery -moderate pulmonary risk secondary to smoking history.   -EKG normal -no cardiac history  HPI 55 y.o.female presents for surgical clearance for left knee replacement.  She has been following with Dr. Marry Guan at [facility] who is going to be doing her knee replacement.  She reports that she has had previous surgery in the past without problems with anesthesia. There is no family history of issues with anesthesia.  She is on cholesterol.  Recent labs in March were normal.  She is a former smoker, but quit less than a year ago. Her heaviest smoking was 1.5 packs per day. She started smoking at 55 years old.  She does not have any cardiac history.  EKG in March was normal.  She does have pre operative appointment tomorrow.  Surgery to be done at [facility].    Past Medical History  Diagnosis Date  . Cancer ([place])     thyroid-1995  . Hiatal hernia   . GERD (gastroesophageal reflux disease)   . Arthritis     OA, RA  . Asthma   . Hyperlipidemia   . Hypertension   . Hypothyroidism      Allergies  Allergen Reactions  . Hydrocodone Itching  . Morphine And Related Itching  . Sulfa Antibiotics Itching    Other reaction(s): Other (See Comments)  . Compazine [Prochlorperazine Edisylate]   . Levaquin [Levofloxacin] Hives  . Lyrica [Pregabalin]     Other reaction(s): Other (See Comments) hallucinations hallucinations  . Ciprofloxacin Hives and Swelling  . Penicillins Itching      Current Outpatient Prescriptions on File Prior to Visit  Medication Sig Dispense Refill  . Adalimumab (HUMIRA PEN) 40 MG/0.8ML PNKT     . albuterol (PROAIR HFA) 108 (90 Base) MCG/ACT inhaler Inhale into the lungs.    Marland Kitchen albuterol (PROVENTIL) (2.5 MG/3ML) 0.083% nebulizer solution Take 3 mLs (2.5 mg total) by nebulization every 6 (six) hours as needed for wheezing or shortness of breath. 60 vial 1   . amitriptyline (ELAVIL) 25 MG tablet Take 25 mg by mouth at bedtime. 1 po a.m. 3 po qhs    . atorvastatin (LIPITOR) 80 MG tablet TAKE 1 TABLET(80 MG) BY MOUTH DAILY AT 6 PM 90 tablet 1  . AVENOVA 0.01 % SOLN Apply to eyelids BID  1  . buprenorphine (BUTRANS) 10 MCG/HR PTWK patch Place 10 mcg onto the skin once a week.    . Calcium Citrate-Vitamin D (CITRACAL + D PO) Take 1 tablet by mouth daily.     . chlorhexidine (PERIDEX) 0.12 % solution   1  . Cholecalciferol (VITAMIN D3) 5000 UNITS TABS Take 5,000 mg by mouth daily.    . clindamycin (CLEOCIN) 150 MG capsule     . clindamycin (CLEOCIN) 150 MG capsule TK 4 CS PO ONE HOUR BEFORE DENTAL APPOINTMENT  1  . cycloSPORINE (RESTASIS) 0.05 % ophthalmic emulsion     . Estradiol Acetate 0.1 MG/24HR RING Insert vaginally every 3 months 1 each 3  . Eyelid Cleansers (AVENOVA) 0.01 % SOLN     . furosemide (LASIX) 40 MG tablet TAKE 1 TABLET BY MOUTH THREE TIMES DAILY FOR FLUID RETENTION 270 tablet 1  . HUMIRA PEN 40 MG/0.8ML PNKT     . LOTEMAX 0.5 % GEL Place 1 drop into both eyes 3 (three) times daily.   2  . Loteprednol Etabonate (LOTEMAX) 0.5 % GEL Apply to eye.    Marland Kitchen  Magnesium Oxide 500 MG TABS Take 500 mg by mouth daily.    Vladimir Faster Glycol-Propyl Glycol (SYSTANE) 0.4-0.3 % SOLN Apply 1 drop to eye 4 (four) times daily.    Marland Kitchen PREVIDENT 5000 SENSITIVE 1.1-5 % PSTE     . PROAIR HFA 108 (90 BASE) MCG/ACT inhaler Inhale 2 puffs into the lungs every 6 (six) hours as needed for wheezing or shortness of breath.     . RESTASIS 0.05 % ophthalmic emulsion   3  . Sod Fluoride-Potassium Nitrate (PREVIDENT 5000 SENSITIVE) 1.1-5 % PSTE     . SYNTHROID 150 MCG tablet TAKE 1 TABLET BY MOUTH EVERY DAY 90 tablet 1  . tamsulosin (FLOMAX) 0.4 MG CAPS capsule TK 1 C PO D  11   No current facility-administered medications on file prior to visit.    ROS: all negative except above.   Physical Exam: Filed Weights   02/04/16 1543  Weight: 200 lb (90.719 kg)   BP  122/74 mmHg  Pulse 96  Temp(Src) 98.2 F (36.8 C) (Temporal)  Resp 18  Ht 5' 3.5" (1.613 m)  Wt 200 lb (90.719 kg)  BMI 34.87 kg/m2  LMP  (LMP Unknown) General Appearance: Well developed well nourished, non-toxic appearing in no apparent distress. Eyes: PERRLA, EOMs, conjunctiva w/ no swelling or erythema or discharge Sinuses: No Frontal/maxillary tenderness ENT/Mouth: Ear canals clear without swelling or erythema.  TM's normal bilaterally with no retractions, bulging, or loss of landmarks.   Neck: Supple, thyroid normal, no notable JVD  Respiratory: Respiratory effort normal, Clear breath sounds anteriorly and posteriorly bilaterally without rales, rhonchi, wheezing or stridor. No retractions or accessory muscle usage. Cardio: RRR with no MRGs.   Abdomen: Soft, + BS.  Non tender, no guarding, rebound, hernias, masses.  Musculoskeletal: Full ROM, 5/5 strength, normal gait.  Skin: Warm, dry without rashes  Neuro: Awake and oriented X 3, Cranial nerves intact. Normal muscle tone, no cerebellar symptoms. Sensation intact.  Psych: normal affect, Insight and Judgment appropriate.     Starlyn Skeans, PA-C 3:57 PM [facility]

## 2016-02-05 ENCOUNTER — Encounter
Admission: RE | Admit: 2016-02-05 | Discharge: 2016-02-05 | Disposition: A | Discharge status: Home / Self Care | Payer: Medicare Other | Source: Ambulatory Visit | Attending: Orthopedic Surgery | Admitting: Orthopedic Surgery

## 2016-02-05 DIAGNOSIS — Z01810 Encounter for preprocedural cardiovascular examination: Secondary | ICD-10-CM | POA: Insufficient documentation

## 2016-02-05 DIAGNOSIS — Z01812 Encounter for preprocedural laboratory examination: Secondary | ICD-10-CM | POA: Diagnosis not present

## 2016-02-05 HISTORY — DX: Human immunodeficiency virus (HIV) disease: B20

## 2016-02-05 HISTORY — DX: Pneumonia, unspecified organism: J18.9

## 2016-02-05 HISTORY — DX: Dorsalgia, unspecified: M54.9

## 2016-02-05 HISTORY — DX: Reserved for inherently not codable concepts without codable children: IMO0001

## 2016-02-05 HISTORY — DX: Other specified systemic involvement of connective tissue: M35.8

## 2016-02-05 LAB — COMPREHENSIVE METABOLIC PANEL
ALBUMIN: 4.3 g/dL (ref 3.5–5.0)
ALK PHOS: 49 U/L (ref 38–126)
ALT: 34 U/L (ref 14–54)
AST: 28 U/L (ref 15–41)
Anion gap: 9 (ref 5–15)
BILIRUBIN TOTAL: 0.9 mg/dL (ref 0.3–1.2)
BUN: 18 mg/dL (ref 6–20)
CALCIUM: 9.2 mg/dL (ref 8.9–10.3)
CO2: 25 mmol/L (ref 22–32)
Chloride: 104 mmol/L (ref 101–111)
Creatinine, Ser: 0.59 mg/dL (ref 0.44–1.00)
GFR calc Af Amer: >60 mL/min (ref >60)
GLUCOSE: 88 mg/dL (ref 65–99)
Potassium: 3.6 mmol/L (ref 3.5–5.1)
Sodium: 138 mmol/L (ref 135–145)
TOTAL PROTEIN: 7.6 g/dL (ref 6.5–8.1)

## 2016-02-05 LAB — URINALYSIS COMPLETE WITH MICROSCOPIC (ARMC ONLY)
BILIRUBIN URINE: NEGATIVE
GLUCOSE, UA: NEGATIVE mg/dL
Hgb urine dipstick: NEGATIVE
Leukocytes, UA: NEGATIVE
Nitrite: NEGATIVE
Protein, ur: NEGATIVE mg/dL
Specific Gravity, Urine: 1.012 (ref 1.005–1.030)
pH: 6 (ref 5.0–8.0)

## 2016-02-05 LAB — CBC WITH DIFFERENTIAL/PLATELET
BASOS PCT: 0 %
Basophils Absolute: 0 10*3/uL (ref 0–0.1)
Eosinophils Absolute: 0 10*3/uL (ref 0–0.7)
Eosinophils Relative: 0 %
HEMATOCRIT: 41 % (ref 35.0–47.0)
HEMOGLOBIN: 13.6 g/dL (ref 12.0–16.0)
LYMPHS PCT: 45 %
Lymphs Abs: 2.9 10*3/uL (ref 1.0–3.6)
MCH: 28.7 pg (ref 26.0–34.0)
MCHC: 33.1 g/dL (ref 32.0–36.0)
MCV: 86.6 fL (ref 80.0–100.0)
MONOS PCT: 7 %
Monocytes Absolute: 0.4 10*3/uL (ref 0.2–0.9)
NEUTROS ABS: 3.2 10*3/uL (ref 1.4–6.5)
NEUTROS PCT: 48 %
Platelets: 304 10*3/uL (ref 150–440)
RBC: 4.74 MIL/uL (ref 3.80–5.20)
RDW: 14.2 % (ref 11.5–14.5)
WBC: 6.6 10*3/uL (ref 3.6–11.0)

## 2016-02-05 LAB — ABO/RH: ABO/RH(D): A POS

## 2016-02-05 LAB — TYPE AND SCREEN
ABO/RH(D): A POS
ANTIBODY SCREEN: NEGATIVE
EXTEND SAMPLE REASON: UNDETERMINED

## 2016-02-05 LAB — SURGICAL PCR SCREEN
MRSA, PCR: NEGATIVE
Staphylococcus aureus: NEGATIVE

## 2016-02-05 LAB — PROTIME-INR
INR: 0.97
Prothrombin Time: 13.1 seconds (ref 11.4–15.0)

## 2016-02-05 LAB — APTT: aPTT: 37 seconds — ABNORMAL HIGH (ref 24–36)

## 2016-02-05 LAB — SEDIMENTATION RATE: Sed Rate: 26 mm/hr (ref 0–30)

## 2016-02-05 NOTE — Pre-Procedure Instructions (Signed)
Cleared for surgery 

## 2016-02-05 NOTE — Pre-Procedure Instructions (Signed)
Autumn Frye for orders.

## 2016-02-05 NOTE — Patient Instructions (Signed)
  Your procedure is scheduled on: 02/16/16 Mon Report to Same Day Surgery 2nd floor medical mall To find out your arrival time please call [phone] between 1PM - 3PM on 02/13/16 Autumn Frye  Remember: Instructions that are not followed completely may result in serious medical risk, up to and including death, or upon the discretion of your surgeon and anesthesiologist your surgery may need to be rescheduled.    _x___ 1. Do not eat food or drink liquids after midnight. No gum chewing or hard candies.     __x__ 2. No Alcohol for 24 hours before or after surgery.   ____ 3. Bring all medications with you on the day of surgery if instructed.    __x__ 4. Notify your doctor if there is any change in your medical condition     (cold, fever, infections).     Do not wear jewelry, make-up, hairpins, clips or nail polish.  Do not wear lotions, powders, or perfumes. You may wear deodorant.  Do not shave 48 hours prior to surgery. Men may shave face and neck.  Do not bring valuables to the hospital.    [facility] is not responsible for any belongings or valuables.               Contacts, dentures or bridgework may not be worn into surgery.  Leave your suitcase in the car. After surgery it may be brought to your room.  For patients admitted to the hospital, discharge time is determined by your treatment team.   Patients discharged the day of surgery will not be allowed to drive home.    Please read over the following fact sheets that you were given:   [facility] Preparing for Surgery and or MRSA Information   _x___ Take these medicines the morning of surgery with A SIP OF WATER:    1. albuterol (PROAIR HFA) 108 (90 Base) MCG/ACT inhaler  2.PROAIR HFA 108 (90 BASE) MCG/ACT inhaler  3.SYNTHROID 150 MCG tablet  4.  5.  6.  ____ Fleet Enema (as directed)   _x___ Use CHG Soap or sage wipes as directed on instruction sheet   __x__ Use inhalers on the day of surgery and bring to hospital day of  surgery  ____ Stop metformin 2 days prior to surgery    ____ Take 1/2 of usual insulin dose the night before surgery and none on the morning of           surgery.   ____ Stop aspirin or coumadin, or plavix  _x__ Stop Anti-inflammatories such as Advil, Aleve, Ibuprofen, Motrin, Naproxen,          Naprosyn, Goodies powders or aspirin products. Ok to take Tylenol.   _x___ Stop supplements until after surgery.  Stop fish oil 1 week before surgery.  ____ Bring C-Pap to the hospital.

## 2016-02-06 LAB — URINE CULTURE: Culture: NO GROWTH

## 2016-02-06 NOTE — Pre-Procedure Instructions (Signed)
Message sent to Dr. Marry Guan; advising him that we need pre-op orders.

## 2016-02-11 DIAGNOSIS — J069 Acute upper respiratory infection, unspecified: Secondary | ICD-10-CM | POA: Diagnosis not present

## 2016-02-11 DIAGNOSIS — M25539 Pain in unspecified wrist: Secondary | ICD-10-CM | POA: Diagnosis not present

## 2016-02-11 DIAGNOSIS — M0609 Rheumatoid arthritis without rheumatoid factor, multiple sites: Secondary | ICD-10-CM | POA: Diagnosis not present

## 2016-02-11 DIAGNOSIS — M25562 Pain in left knee: Secondary | ICD-10-CM | POA: Diagnosis not present

## 2016-02-11 DIAGNOSIS — M797 Fibromyalgia: Secondary | ICD-10-CM | POA: Diagnosis not present

## 2016-02-16 ENCOUNTER — Encounter: Payer: Self-pay | Admitting: *Deleted

## 2016-02-16 ENCOUNTER — Encounter
Admission: RE | Disposition: A | Discharge status: Home / Self Care | Payer: Self-pay | Source: Ambulatory Visit | Attending: Orthopedic Surgery

## 2016-02-16 ENCOUNTER — Inpatient Hospital Stay: Payer: Medicare Other | Admitting: Certified Registered Nurse Anesthetist

## 2016-02-16 ENCOUNTER — Inpatient Hospital Stay
Admission: RE | Admit: 2016-02-16 | Discharge: 2016-02-19 | DRG: 470 | Disposition: A | Discharge status: Home / Self Care | Payer: Medicare Other | Source: Ambulatory Visit | Attending: Orthopedic Surgery | Admitting: Orthopedic Surgery

## 2016-02-16 ENCOUNTER — Inpatient Hospital Stay: Payer: Medicare Other

## 2016-02-16 DIAGNOSIS — K219 Gastro-esophageal reflux disease without esophagitis: Secondary | ICD-10-CM | POA: Diagnosis not present

## 2016-02-16 DIAGNOSIS — Z96659 Presence of unspecified artificial knee joint: Secondary | ICD-10-CM

## 2016-02-16 DIAGNOSIS — Z87891 Personal history of nicotine dependence: Secondary | ICD-10-CM | POA: Diagnosis not present

## 2016-02-16 DIAGNOSIS — Z471 Aftercare following joint replacement surgery: Secondary | ICD-10-CM | POA: Diagnosis not present

## 2016-02-16 DIAGNOSIS — M069 Rheumatoid arthritis, unspecified: Secondary | ICD-10-CM | POA: Diagnosis not present

## 2016-02-16 DIAGNOSIS — Z96652 Presence of left artificial knee joint: Secondary | ICD-10-CM | POA: Diagnosis not present

## 2016-02-16 DIAGNOSIS — E039 Hypothyroidism, unspecified: Secondary | ICD-10-CM | POA: Diagnosis present

## 2016-02-16 DIAGNOSIS — M179 Osteoarthritis of knee, unspecified: Secondary | ICD-10-CM | POA: Diagnosis not present

## 2016-02-16 DIAGNOSIS — I10 Essential (primary) hypertension: Secondary | ICD-10-CM | POA: Diagnosis not present

## 2016-02-16 DIAGNOSIS — M25762 Osteophyte, left knee: Secondary | ICD-10-CM | POA: Diagnosis present

## 2016-02-16 DIAGNOSIS — J45909 Unspecified asthma, uncomplicated: Secondary | ICD-10-CM | POA: Diagnosis present

## 2016-02-16 DIAGNOSIS — M06062 Rheumatoid arthritis without rheumatoid factor, left knee: Secondary | ICD-10-CM | POA: Diagnosis not present

## 2016-02-16 DIAGNOSIS — M1712 Unilateral primary osteoarthritis, left knee: Secondary | ICD-10-CM | POA: Diagnosis not present

## 2016-02-16 HISTORY — DX: Presence of unspecified artificial knee joint: Z96.659

## 2016-02-16 HISTORY — PX: KNEE ARTHROPLASTY: SHX992

## 2016-02-16 LAB — TYPE AND SCREEN
ABO/RH(D): A POS
ANTIBODY SCREEN: NEGATIVE

## 2016-02-16 SURGERY — ARTHROPLASTY, KNEE, TOTAL, USING IMAGELESS COMPUTER-ASSISTED NAVIGATION
Anesthesia: Spinal | Site: Knee | Laterality: Left | Wound class: Clean

## 2016-02-16 MED ORDER — CLINDAMYCIN PHOSPHATE 600 MG/50ML IV SOLN
600.0000 mg | Freq: Four times a day (QID) | INTRAVENOUS | Status: AC
  Administered 2016-02-17 (×3): 600 mg via INTRAVENOUS
  Filled 2016-02-16 (×4): qty 50

## 2016-02-16 MED ORDER — AMITRIPTYLINE HCL 50 MG PO TABS
50.0000 mg | ORAL_TABLET | Freq: Every day | ORAL | Status: DC
  Administered 2016-02-16 – 2016-02-18 (×3): 50 mg via ORAL
  Filled 2016-02-16 (×3): qty 1

## 2016-02-16 MED ORDER — LEVOTHYROXINE SODIUM 150 MCG PO TABS
150.0000 ug | ORAL_TABLET | Freq: Every day | ORAL | Status: DC
  Administered 2016-02-17 – 2016-02-19 (×3): 150 ug via ORAL
  Filled 2016-02-16 (×4): qty 1

## 2016-02-16 MED ORDER — NEOMYCIN-POLYMYXIN B GU 40-200000 IR SOLN
Status: AC
  Filled 2016-02-16: qty 20

## 2016-02-16 MED ORDER — FAMOTIDINE 20 MG PO TABS
ORAL_TABLET | ORAL | Status: AC
  Filled 2016-02-16: qty 1

## 2016-02-16 MED ORDER — SODIUM CHLORIDE 0.9 % IV SOLN
INTRAVENOUS | Status: DC
  Administered 2016-02-16: 19:00:00 via INTRAVENOUS
  Administered 2016-02-17: 100 mL/h via INTRAVENOUS

## 2016-02-16 MED ORDER — MAGNESIUM HYDROXIDE 400 MG/5ML PO SUSP
30.0000 mL | Freq: Every day | ORAL | Status: DC | PRN
  Administered 2016-02-17: 30 mL via ORAL
  Filled 2016-02-16: qty 30

## 2016-02-16 MED ORDER — FENTANYL CITRATE (PF) 100 MCG/2ML IJ SOLN: 25.0000 ug | INTRAMUSCULAR | Status: DC | PRN

## 2016-02-16 MED ORDER — LACTATED RINGERS IV SOLN
INTRAVENOUS | Status: DC
  Administered 2016-02-16: 11:00:00 via INTRAVENOUS

## 2016-02-16 MED ORDER — ACETAMINOPHEN 325 MG PO TABS
650.0000 mg | ORAL_TABLET | Freq: Four times a day (QID) | ORAL | Status: DC | PRN
  Administered 2016-02-16 – 2016-02-19 (×3): 650 mg via ORAL
  Filled 2016-02-16 (×3): qty 2

## 2016-02-16 MED ORDER — DEXAMETHASONE SODIUM PHOSPHATE 10 MG/ML IJ SOLN
INTRAMUSCULAR | Status: DC | PRN
  Administered 2016-02-16: 10 mg via INTRAVENOUS

## 2016-02-16 MED ORDER — POLYVINYL ALCOHOL 1.4 % OP SOLN
1.0000 [drp] | Freq: Four times a day (QID) | OPHTHALMIC | Status: DC
  Administered 2016-02-16 – 2016-02-19 (×11): 1 [drp] via OPHTHALMIC
  Filled 2016-02-16: qty 15

## 2016-02-16 MED ORDER — VITAMIN D 1000 UNITS PO TABS
5000.0000 [IU] | ORAL_TABLET | Freq: Every day | ORAL | Status: DC
Start: 2016-02-17 — End: 2016-02-19
  Administered 2016-02-17 – 2016-02-19 (×3): 5000 [IU] via ORAL
  Filled 2016-02-16 (×3): qty 5

## 2016-02-16 MED ORDER — MAGNESIUM OXIDE 400 (241.3 MG) MG PO TABS
400.0000 mg | ORAL_TABLET | Freq: Every day | ORAL | Status: DC
Start: 2016-02-17 — End: 2016-02-19
  Administered 2016-02-17 – 2016-02-19 (×3): 400 mg via ORAL
  Filled 2016-02-16 (×3): qty 1

## 2016-02-16 MED ORDER — BUPIVACAINE-EPINEPHRINE (PF) 0.25% -1:200000 IJ SOLN
INTRAMUSCULAR | Status: DC | PRN
  Administered 2016-02-16: 30 mL

## 2016-02-16 MED ORDER — BUPIVACAINE-EPINEPHRINE (PF) 0.25% -1:200000 IJ SOLN
INTRAMUSCULAR | Status: AC
  Filled 2016-02-16: qty 30

## 2016-02-16 MED ORDER — ACETAMINOPHEN 10 MG/ML IV SOLN
INTRAVENOUS | Status: AC
  Filled 2016-02-16: qty 100

## 2016-02-16 MED ORDER — CLINDAMYCIN PHOSPHATE 900 MG/50ML IV SOLN
INTRAVENOUS | Status: AC
  Filled 2016-02-16: qty 50

## 2016-02-16 MED ORDER — BUPIVACAINE HCL (PF) 0.5 % IJ SOLN
INTRAMUSCULAR | Status: DC | PRN
  Administered 2016-02-16: 2.4 mL

## 2016-02-16 MED ORDER — LIDOCAINE HCL (CARDIAC) 20 MG/ML IV SOLN
INTRAVENOUS | Status: DC | PRN
  Administered 2016-02-16: 50 mg via INTRAVENOUS

## 2016-02-16 MED ORDER — ALUM & MAG HYDROXIDE-SIMETH 200-200-20 MG/5ML PO SUSP: 30.0000 mL | ORAL | Status: DC | PRN

## 2016-02-16 MED ORDER — ACETAMINOPHEN 10 MG/ML IV SOLN
INTRAVENOUS | Status: DC | PRN
  Administered 2016-02-16: 1000 mg via INTRAVENOUS

## 2016-02-16 MED ORDER — FERROUS SULFATE 325 (65 FE) MG PO TABS
325.0000 mg | ORAL_TABLET | Freq: Two times a day (BID) | ORAL | Status: DC
  Administered 2016-02-17 – 2016-02-19 (×5): 325 mg via ORAL
  Filled 2016-02-16 (×5): qty 1

## 2016-02-16 MED ORDER — OXYCODONE HCL 5 MG PO TABS
5.0000 mg | ORAL_TABLET | ORAL | Status: DC | PRN
  Administered 2016-02-16: 5 mg via ORAL
  Administered 2016-02-16: 10 mg via ORAL
  Administered 2016-02-16: 5 mg via ORAL
  Administered 2016-02-17 – 2016-02-19 (×12): 10 mg via ORAL
  Filled 2016-02-16: qty 2
  Filled 2016-02-16: qty 1
  Filled 2016-02-16 (×9): qty 2
  Filled 2016-02-16: qty 1
  Filled 2016-02-16 (×4): qty 2

## 2016-02-16 MED ORDER — CHLORHEXIDINE GLUCONATE 4 % EX LIQD: 60.0000 mL | Freq: Once | CUTANEOUS | Status: DC

## 2016-02-16 MED ORDER — ALBUTEROL SULFATE HFA 108 (90 BASE) MCG/ACT IN AERS: 2.0000 | INHALATION_SPRAY | Freq: Four times a day (QID) | RESPIRATORY_TRACT | Status: DC | PRN

## 2016-02-16 MED ORDER — ALBUTEROL SULFATE (2.5 MG/3ML) 0.083% IN NEBU: 3.0000 mL | INHALATION_SOLUTION | Freq: Four times a day (QID) | RESPIRATORY_TRACT | Status: DC | PRN

## 2016-02-16 MED ORDER — FAMOTIDINE 20 MG PO TABS
20.0000 mg | ORAL_TABLET | Freq: Once | ORAL | Status: AC
  Administered 2016-02-16: 20 mg via ORAL

## 2016-02-16 MED ORDER — FENTANYL CITRATE (PF) 100 MCG/2ML IJ SOLN
INTRAMUSCULAR | Status: DC | PRN
  Administered 2016-02-16 (×2): 25 ug via INTRAVENOUS
  Administered 2016-02-16 (×2): 50 ug via INTRAVENOUS

## 2016-02-16 MED ORDER — METOCLOPRAMIDE HCL 10 MG PO TABS
10.0000 mg | ORAL_TABLET | Freq: Three times a day (TID) | ORAL | Status: AC
  Administered 2016-02-16 – 2016-02-18 (×8): 10 mg via ORAL
  Filled 2016-02-16 (×8): qty 1

## 2016-02-16 MED ORDER — BUPIVACAINE LIPOSOME 1.3 % IJ SUSP
INTRAMUSCULAR | Status: AC
  Filled 2016-02-16: qty 20

## 2016-02-16 MED ORDER — MENTHOL 3 MG MT LOZG
1.0000 | LOZENGE | OROMUCOSAL | Status: DC | PRN
  Filled 2016-02-16: qty 9

## 2016-02-16 MED ORDER — ONDANSETRON HCL 4 MG/2ML IJ SOLN: 4.0000 mg | Freq: Once | INTRAMUSCULAR | Status: DC | PRN

## 2016-02-16 MED ORDER — ONDANSETRON HCL 4 MG/2ML IJ SOLN
INTRAMUSCULAR | Status: DC | PRN
  Administered 2016-02-16: 4 mg via INTRAVENOUS

## 2016-02-16 MED ORDER — ONDANSETRON HCL 4 MG/2ML IJ SOLN: 4.0000 mg | Freq: Four times a day (QID) | INTRAMUSCULAR | Status: DC | PRN

## 2016-02-16 MED ORDER — FUROSEMIDE 40 MG PO TABS
40.0000 mg | ORAL_TABLET | Freq: Every day | ORAL | Status: DC
  Administered 2016-02-17: 40 mg via ORAL
  Filled 2016-02-16 (×3): qty 1

## 2016-02-16 MED ORDER — OXYTOCIN 40 UNITS IN LACTATED RINGERS INFUSION - SIMPLE MED: INTRAVENOUS | Status: DC | PRN

## 2016-02-16 MED ORDER — CLINDAMYCIN PHOSPHATE 900 MG/50ML IV SOLN
900.0000 mg | INTRAVENOUS | Status: AC
  Administered 2016-02-16: 900 mg via INTRAVENOUS

## 2016-02-16 MED ORDER — PROPOFOL 500 MG/50ML IV EMUL
INTRAVENOUS | Status: DC | PRN
  Administered 2016-02-16: 75 ug/kg/min via INTRAVENOUS

## 2016-02-16 MED ORDER — BUPIVACAINE LIPOSOME 1.3 % IJ SUSP
INTRAMUSCULAR | Status: DC | PRN
  Administered 2016-02-16: 60 mL

## 2016-02-16 MED ORDER — OMEGA-3-ACID ETHYL ESTERS 1 G PO CAPS
1.0000 g | ORAL_CAPSULE | Freq: Every day | ORAL | Status: DC
  Administered 2016-02-17 – 2016-02-19 (×3): 1 g via ORAL
  Filled 2016-02-16 (×3): qty 1

## 2016-02-16 MED ORDER — DIPHENHYDRAMINE HCL 12.5 MG/5ML PO ELIX: 12.5000 mg | ORAL_SOLUTION | ORAL | Status: DC | PRN

## 2016-02-16 MED ORDER — SODIUM CHLORIDE 0.9 % IV SOLN
1000.0000 mg | INTRAVENOUS | Status: AC
  Administered 2016-02-16: 1000 mg via INTRAVENOUS
  Filled 2016-02-16: qty 10

## 2016-02-16 MED ORDER — ACETAMINOPHEN 10 MG/ML IV SOLN
1000.0000 mg | Freq: Four times a day (QID) | INTRAVENOUS | Status: AC
  Administered 2016-02-16 – 2016-02-17 (×3): 1000 mg via INTRAVENOUS
  Filled 2016-02-16 (×4): qty 100

## 2016-02-16 MED ORDER — ACETAMINOPHEN 650 MG RE SUPP: 650.0000 mg | Freq: Four times a day (QID) | RECTAL | Status: DC | PRN

## 2016-02-16 MED ORDER — ENOXAPARIN SODIUM 30 MG/0.3ML ~~LOC~~ SOLN
30.0000 mg | Freq: Two times a day (BID) | SUBCUTANEOUS | Status: DC
  Administered 2016-02-17 – 2016-02-19 (×5): 30 mg via SUBCUTANEOUS
  Filled 2016-02-16 (×5): qty 0.3

## 2016-02-16 MED ORDER — BUPRENORPHINE 10 MCG/HR TD PTWK: 10.0000 ug | MEDICATED_PATCH | TRANSDERMAL | Status: DC

## 2016-02-16 MED ORDER — PHENOL 1.4 % MT LIQD
1.0000 | OROMUCOSAL | Status: DC | PRN
  Filled 2016-02-16: qty 177

## 2016-02-16 MED ORDER — BISACODYL 10 MG RE SUPP: 10.0000 mg | Freq: Every day | RECTAL | Status: DC | PRN

## 2016-02-16 MED ORDER — SENNOSIDES-DOCUSATE SODIUM 8.6-50 MG PO TABS
1.0000 | ORAL_TABLET | Freq: Two times a day (BID) | ORAL | Status: DC
  Administered 2016-02-16 – 2016-02-19 (×5): 1 via ORAL
  Filled 2016-02-16 (×5): qty 1

## 2016-02-16 MED ORDER — FLEET ENEMA 7-19 GM/118ML RE ENEM: 1.0000 | ENEMA | Freq: Once | RECTAL | Status: DC | PRN

## 2016-02-16 MED ORDER — ONDANSETRON HCL 4 MG PO TABS: 4.0000 mg | ORAL_TABLET | Freq: Four times a day (QID) | ORAL | Status: DC | PRN

## 2016-02-16 MED ORDER — SODIUM CHLORIDE 0.9 % IV SOLN
1000.0000 mg | Freq: Once | INTRAVENOUS | Status: AC
  Administered 2016-02-16: 1000 mg via INTRAVENOUS
  Filled 2016-02-16: qty 10

## 2016-02-16 MED ORDER — PHENYLEPHRINE HCL 10 MG/ML IJ SOLN
INTRAMUSCULAR | Status: DC | PRN
  Administered 2016-02-16: 50 ug via INTRAVENOUS
  Administered 2016-02-16: 150 ug via INTRAVENOUS

## 2016-02-16 MED ORDER — PANTOPRAZOLE SODIUM 40 MG PO TBEC
40.0000 mg | DELAYED_RELEASE_TABLET | Freq: Two times a day (BID) | ORAL | Status: DC
  Administered 2016-02-16 – 2016-02-19 (×6): 40 mg via ORAL
  Filled 2016-02-16 (×6): qty 1

## 2016-02-16 MED ORDER — CYCLOSPORINE 0.05 % OP EMUL
1.0000 [drp] | Freq: Two times a day (BID) | OPHTHALMIC | Status: DC
Start: 2016-02-16 — End: 2016-02-19
  Administered 2016-02-16 – 2016-02-19 (×6): 1 [drp] via OPHTHALMIC
  Filled 2016-02-16 (×8): qty 1

## 2016-02-16 MED ORDER — SODIUM CHLORIDE 0.9 % IJ SOLN
INTRAMUSCULAR | Status: AC
  Filled 2016-02-16: qty 50

## 2016-02-16 MED ORDER — MEPERIDINE HCL 25 MG/ML IJ SOLN
12.5000 mg | INTRAMUSCULAR | Status: DC | PRN
  Administered 2016-02-16: 12.5 mg via INTRAVENOUS
  Administered 2016-02-17: 25 mg via INTRAVENOUS
  Administered 2016-02-18: 12.5 mg via INTRAVENOUS
  Filled 2016-02-16 (×3): qty 1

## 2016-02-16 MED ORDER — NEOMYCIN-POLYMYXIN B GU 40-200000 IR SOLN
Status: DC | PRN
  Administered 2016-02-16: 14 mL

## 2016-02-16 MED ORDER — TETRACAINE HCL 1 % IJ SOLN
INTRAMUSCULAR | Status: AC
  Filled 2016-02-16: qty 2

## 2016-02-16 MED ORDER — MIDAZOLAM HCL 5 MG/5ML IJ SOLN
INTRAMUSCULAR | Status: DC | PRN
  Administered 2016-02-16 (×2): 1 mg via INTRAVENOUS

## 2016-02-16 MED ORDER — PROPOFOL 10 MG/ML IV BOLUS
INTRAVENOUS | Status: DC | PRN
  Administered 2016-02-16: 20 mg via INTRAVENOUS
  Administered 2016-02-16: 10 mg via INTRAVENOUS

## 2016-02-16 MED ORDER — TETRACAINE HCL 1 % IJ SOLN
INTRAMUSCULAR | Status: DC | PRN
  Administered 2016-02-16: .8 mg via INTRASPINAL

## 2016-02-16 MED ORDER — TRAMADOL HCL 50 MG PO TABS
50.0000 mg | ORAL_TABLET | ORAL | Status: DC | PRN
  Administered 2016-02-17: 100 mg via ORAL
  Administered 2016-02-17: 50 mg via ORAL
  Filled 2016-02-16: qty 1
  Filled 2016-02-16: qty 2

## 2016-02-16 SURGICAL SUPPLY — 67 items
AUTOTRANSFUS HAS 1/8 (MISCELLANEOUS) ×3
BATTERY INSTRU NAVIGATION (MISCELLANEOUS) ×10 IMPLANT
BLADE SAW 1 (BLADE) ×3 IMPLANT
BLADE SAW 1/2 (BLADE) ×3 IMPLANT
BONE CEMENT GENTAMICIN (Cement) ×6 IMPLANT
BTRY SRG DRVR LF (MISCELLANEOUS) ×3
CANISTER SUCT 1200ML W/VALVE (MISCELLANEOUS) ×3 IMPLANT
CANISTER SUCT 3000ML (MISCELLANEOUS) ×6 IMPLANT
CAPT KNEE TOTAL 3 ATTUNE ×2 IMPLANT
CATH TRAY METER 16FR LF (MISCELLANEOUS) ×3 IMPLANT
CEMENT BONE GENTAMICIN 40 (Cement) IMPLANT
COOLER POLAR GLACIER W/PUMP (MISCELLANEOUS) ×3 IMPLANT
CUFF TOURN 24 STER (MISCELLANEOUS) IMPLANT
CUFF TOURN 30 STER DUAL PORT (MISCELLANEOUS) ×2 IMPLANT
DRAPE SHEET LG 3/4 BI-LAMINATE (DRAPES) ×3 IMPLANT
DRSG DERMACEA 8X12 NADH (GAUZE/BANDAGES/DRESSINGS) ×3 IMPLANT
DRSG OPSITE POSTOP 4X14 (GAUZE/BANDAGES/DRESSINGS) ×3 IMPLANT
DRSG TEGADERM 4X4.75 (GAUZE/BANDAGES/DRESSINGS) ×3 IMPLANT
DURAPREP 26ML APPLICATOR (WOUND CARE) ×6 IMPLANT
ELECT CAUTERY BLADE 6.4 (BLADE) ×3 IMPLANT
ELECT REM PT RETURN 9FT ADLT (ELECTROSURGICAL) ×3
ELECTRODE REM PT RTRN 9FT ADLT (ELECTROSURGICAL) ×1 IMPLANT
EX-PIN ORTHOLOCK NAV 4X150 (PIN) ×6 IMPLANT
GLOVE BIO SURGEON STRL SZ7 (GLOVE) ×2 IMPLANT
GLOVE BIO SURGEON STRL SZ7.5 (GLOVE) ×2 IMPLANT
GLOVE BIOGEL M STRL SZ7.5 (GLOVE) ×8 IMPLANT
GLOVE INDICATOR 7.5 STRL GRN (GLOVE) ×2 IMPLANT
GLOVE INDICATOR 8.0 STRL GRN (GLOVE) ×3 IMPLANT
GLOVE SURG 9.0 ORTHO LTXF (GLOVE) ×3 IMPLANT
GLOVE SURG ORTHO 9.0 STRL STRW (GLOVE) ×3 IMPLANT
GOWN STRL REUS W/ TWL LRG LVL3 (GOWN DISPOSABLE) ×2 IMPLANT
GOWN STRL REUS W/ TWL LRG LVL4 (GOWN DISPOSABLE) IMPLANT
GOWN STRL REUS W/TWL 2XL LVL3 (GOWN DISPOSABLE) ×3 IMPLANT
GOWN STRL REUS W/TWL LRG LVL3 (GOWN DISPOSABLE) ×6
GOWN STRL REUS W/TWL LRG LVL4 (GOWN DISPOSABLE) ×3
HANDPIECE SUCTION TUBG SURGILV (MISCELLANEOUS) ×3 IMPLANT
HOLDER FOLEY CATH W/STRAP (MISCELLANEOUS) ×3 IMPLANT
HOOD PEEL AWAY FLYTE STAYCOOL (MISCELLANEOUS) ×6 IMPLANT
KIT RM TURNOVER STRD PROC AR (KITS) ×3 IMPLANT
KNIFE SCULPS 14X20 (INSTRUMENTS) ×3 IMPLANT
NDL SAFETY 18GX1.5 (NEEDLE) ×3 IMPLANT
NDL SPNL 20GX3.5 QUINCKE YW (NEEDLE) ×1 IMPLANT
NEEDLE SPNL 20GX3.5 QUINCKE YW (NEEDLE) ×3 IMPLANT
NS IRRIG 500ML POUR BTL (IV SOLUTION) ×3 IMPLANT
PACK TOTAL KNEE (MISCELLANEOUS) ×3 IMPLANT
PAD WRAPON POLAR KNEE (MISCELLANEOUS) ×1 IMPLANT
PIN DRILL QUICK PACK ×3 IMPLANT
PIN FIXATION 1/8DIA X 3INL (PIN) ×3 IMPLANT
SOL .9 NS 3000ML IRR  AL (IV SOLUTION) ×2
SOL .9 NS 3000ML IRR AL (IV SOLUTION) ×1
SOL .9 NS 3000ML IRR UROMATIC (IV SOLUTION) ×1 IMPLANT
SOL PREP PVP 2OZ (MISCELLANEOUS) ×3
SOLUTION PREP PVP 2OZ (MISCELLANEOUS) ×1 IMPLANT
SPONGE DRAIN TRACH 4X4 STRL 2S (GAUZE/BANDAGES/DRESSINGS) ×3 IMPLANT
STAPLER SKIN PROX 35W (STAPLE) ×3 IMPLANT
SUCTION FRAZIER HANDLE 10FR (MISCELLANEOUS) ×2
SUCTION TUBE FRAZIER 10FR DISP (MISCELLANEOUS) ×1 IMPLANT
SUT VIC AB 0 CT1 36 (SUTURE) ×3 IMPLANT
SUT VIC AB 1 CT1 36 (SUTURE) ×6 IMPLANT
SUT VIC AB 2-0 CT2 27 (SUTURE) ×3 IMPLANT
SYR 20CC LL (SYRINGE) ×3 IMPLANT
SYR 30ML LL (SYRINGE) ×3 IMPLANT
SYR 50ML LL SCALE MARK (SYRINGE) ×3 IMPLANT
SYSTEM AUTOTRANSFUS DUAL TROCR (MISCELLANEOUS) ×1 IMPLANT
TOWEL OR 17X26 4PK STRL BLUE (TOWEL DISPOSABLE) ×3 IMPLANT
TOWER CARTRIDGE SMART MIX (DISPOSABLE) ×3 IMPLANT
WRAPON POLAR PAD KNEE (MISCELLANEOUS) ×3

## 2016-02-16 NOTE — Op Note (Signed)
OPERATIVE NOTE  DATE OF SURGERY:  02/16/2016  PATIENT NAME:  Autumn Frye   DOB: 1961/05/26  MRN: [id number]  PRE-OPERATIVE DIAGNOSIS: Degenerative arthrosis of the left knee superimposed on rheumatoid arthritis  POST-OPERATIVE DIAGNOSIS:  Same  PROCEDURE:  Left total knee arthroplasty using computer-assisted navigation  SURGEON:  Marciano Sequin. M.D.  ASSISTANT:  Vance Peper, PA (present and scrubbed throughout the case, critical for assistance with exposure, retraction, instrumentation, and closure)  ANESTHESIA: spinal  ESTIMATED BLOOD LOSS: 50 mL  FLUIDS REPLACED: 1400 mL of crystalloid  TOURNIQUET TIME: 146 minutes  DRAINS: 2 medium drains to a reinfusion system  SOFT TISSUE RELEASES: Anterior cruciate ligament, posterior cruciate ligament, deep medial collateral ligament, patellofemoral ligament   IMPLANTS UTILIZED: DePuy Attune size 5N (narrow) posterior stabilized femoral component (cemented), size 3 rotating platform tibial component (cemented), 32 mm medialized dome patella (cemented), and a 5 mm stabilized rotating platform polyethylene insert.  INDICATIONS FOR SURGERY: Autumn Frye is a 55 y.o. year old female with a long history of progressive knee pain. She has a history of rheumatoid arthritis. She also has a previous history of extensor mechanism realignment. X-rays demonstrated severe degenerative changes in tricompartmental fashion. The patient had not seen any significant improvement despite conservative nonsurgical intervention. After discussion of the risks and benefits of surgical intervention, the patient expressed understanding of the risks benefits and agree with plans for total knee arthroplasty.   The risks, benefits, and alternatives were discussed at length including but not limited to the risks of infection, bleeding, nerve injury, stiffness, blood clots, the need for revision surgery, cardiopulmonary complications, among others, and they were  willing to proceed.  PROCEDURE IN DETAIL: The patient was brought into the operating room and, after adequate spinal anesthesia was achieved, a tourniquet was placed on the patient's upper thigh. The patient's knee and leg were cleaned and prepped with alcohol and DuraPrep and draped in the usual sterile fashion. A "timeout" was performed as per usual protocol. The lower extremity was exsanguinated using an Esmarch, and the tourniquet was inflated to 300 mmHg. An anterior longitudinal incision was made followed by a standard mid vastus approach. The deep fibers of the medial collateral ligament were elevated in a subperiosteal fashion off of the medial flare of the tibia so as to maintain a continuous soft tissue sleeve. The patella was subluxed laterally and the patellofemoral ligament was incised. Inspection of the knee demonstrated severe degenerative changes with full-thickness loss of articular cartilage. Osteophytes were debrided using a rongeur. Anterior and posterior cruciate ligaments were excised. Two 4.0 mm Schanz pins were inserted in the femur and into the tibia for attachment of the array of trackers used for computer-assisted navigation. Hip center was identified using a circumduction technique. Distal landmarks were mapped using the computer. The distal femur and proximal tibia were mapped using the computer. The distal femoral cutting guide was positioned using computer-assisted navigation so as to achieve a 5 distal valgus cut. The femur was sized and it was felt that a size 5N (narrow) femoral component was appropriate. A size 5 femoral cutting guide was positioned and the anterior cut was performed and verified using the computer. This was followed by completion of the posterior and chamfer cuts. Femoral cutting guide for the central box was then positioned in the center box cut was performed.  Attention was then directed to the proximal tibia. Medial and lateral menisci were excised. The  extramedullary tibial cutting guide was positioned using  computer-assisted navigation so as to achieve a 0 varus-valgus alignment and 3 posterior slope. The cut was performed and verified using the computer. The proximal tibia was sized and it was felt that a size 3 tibial tray was appropriate. Tibial and femoral trials were inserted, but the 5 mm polyethylene trial could not be inserted. An additional 4 mm of bone was resected from the proximal tibia using the extramedullary tibial cutting guide. Trial components were reinserted followed by insertion of a 5 mm polyethylene trial. This allowed for excellent mediolateral soft tissue balancing both in flexion and in full extension. Finally, the patella was cut and prepared so as to accommodate a 32 mm medialized dome patella. A patella trial was placed and the knee was placed through a range of motion with excellent patellar tracking appreciated. The femoral trial was removed after debridement of posterior osteophytes. The central post-hole for the tibial component was reamed followed by insertion of a keel punch. Tibial trials were then removed. Cut surfaces of bone were irrigated with copious amounts of normal saline with antibiotic solution using pulsatile lavage and then suctioned dry. Polymethylmethacrylate cement with gentamicin was prepared in the usual fashion using a vacuum mixer. Cement was applied to the cut surface of the proximal tibia as well as along the undersurface of a size 3 rotating platform tibial component. Tibial component was positioned and impacted into place. Excess cement was removed using [employer] fast streamer. Cement was then applied to the cut surfaces of the femur as well as along the posterior flanges of the size 5N (narrow) femoral component. The femoral component was positioned and impacted into place. Excess cement was removed using [employer] fast streamer. A 5 mm polyethylene trial was inserted and the knee was brought into full extension with  steady axial compression applied. Finally, cement was applied to the backside of a 32 mm medialized dome patella and the patellar component was positioned and patellar clamp applied. Excess cement was removed using [employer] fast streamer. After adequate curing of the cement, the tourniquet was deflated after a total tourniquet time of 146 minutes. Hemostasis was achieved using electrocautery. The knee was irrigated with copious amounts of normal saline with antibiotic solution using pulsatile lavage and then suctioned dry. 20 mL of 1.3% Exparel in 40 mL of normal saline was injected along the posterior capsule, medial and lateral gutters, and along the arthrotomy site. A 5 mm stabilized rotating platform polyethylene insert was inserted and the knee was placed through a range of motion with excellent mediolateral soft tissue balancing appreciated and excellent patellar tracking noted. 2 medium drains were placed in the wound bed and brought out through separate stab incisions to be attached to a reinfusion system. The medial parapatellar portion of the incision was reapproximated using interrupted sutures of #1 Vicryl. Subcutaneous tissue was then injected with a total of 30 cc of 0.25% Marcaine with epinephrine. Subcutaneous tissue was approximated in layers using first #0 Vicryl followed #2-0 Vicryl. The skin was approximated with skin staples. A sterile dressing was applied.  The patient tolerated the procedure well and was transported to the recovery room in stable condition.    James P. Holley Bouche., M.D.

## 2016-02-16 NOTE — Progress Notes (Signed)
To OR with thermal cap in place Sacral drsg, cleocin, txa - sent to OR with patient   T&S redrawn because, per lab, "all the questions weren't answered during preadmission visit"

## 2016-02-16 NOTE — Anesthesia Procedure Notes (Addendum)
Date/Time: 02/16/2016 11:55 AM Performed by: Johnna Acosta Pre-anesthesia Checklist: Patient identified, Emergency Drugs available, Suction available, Patient being monitored and Timeout performed Patient Re-evaluated:Patient Re-evaluated prior to inductionOxygen Delivery Method: Simple face mask Preoxygenation: Pre-oxygenation with 100% oxygen   Spinal Patient location during procedure: OR Start time: 02/16/2016 11:49 AM End time: 02/16/2016 11:55 AM Staffing Anesthesiologist: Gunnar Fusi Resident/CRNA: Johnna Acosta Performed by: anesthesiologist and resident/CRNA  Preanesthetic Checklist Completed: patient identified, site marked, surgical consent, pre-op evaluation, timeout performed, IV checked, risks and benefits discussed and monitors and equipment checked Spinal Block Patient position: sitting Prep: Betadine Patient monitoring: heart rate, continuous pulse ox, blood pressure and cardiac monitor Approach: midline Location: L3-4 Injection technique: single-shot Needle Needle type: Whitacre and Introducer  Needle gauge: 25 G Needle length: 9 cm Assessment Sensory level: T4 Additional Notes Negative paresthesia. Negative blood return. Positive free-flowing CSF. Expiration date of kit checked and confirmed. Patient tolerated procedure well, without complications.

## 2016-02-16 NOTE — Brief Op Note (Signed)
02/16/2016  4:18 PM  PATIENT:  Autumn Frye  55 y.o. female  PRE-OPERATIVE DIAGNOSIS:  OSTEOARTHRITIS of the left knee superimposed on rheumatoid arthritis  POST-OPERATIVE DIAGNOSIS:  Same  PROCEDURE:  Procedure(s): COMPUTER ASSISTED TOTAL KNEE ARTHROPLASTY (Left)  SURGEON:  Surgeon(s) and Role:    * Dereck Leep, MD - Primary  ASSISTANTS: Vance Peper, PA   ANESTHESIA:   spinal  EBL:  Total I/O In: 1500 [I.V.:1500] Out: 275 [Urine:225; Blood:50]  BLOOD ADMINISTERED:none  DRAINS: 2 medium drains to a reinfusion system   LOCAL MEDICATIONS USED:  MARCAINE    and OTHER Exparel  SPECIMEN:  No Specimen  DISPOSITION OF SPECIMEN:  N/A  COUNTS:  YES  TOURNIQUET:   146 minutes  DICTATION: .Dragon Dictation  PLAN OF CARE: Admit to inpatient   PATIENT DISPOSITION:  PACU - hemodynamically stable.   Delay start of Pharmacological VTE agent (>24hrs) due to surgical blood loss or risk of bleeding: yes

## 2016-02-16 NOTE — Anesthesia Preprocedure Evaluation (Signed)
Anesthesia Evaluation  Patient identified by MRN, date of birth, ID band Patient awake    Reviewed: Allergy & Precautions, NPO status , Patient's Chart, lab work & pertinent test results  History of Anesthesia Complications Negative for: history of anesthetic complications  Airway Mallampati: I       Dental   Pulmonary asthma , former smoker,           Cardiovascular Hypertension: pt denies.      Neuro/Psych negative neurological ROS     GI/Hepatic Neg liver ROS, hiatal hernia, GERD  Poorly Controlled,  Endo/Other  Hypothyroidism   Renal/GU negative Renal ROS     Musculoskeletal  (+) Arthritis , Osteoarthritis,    Abdominal   Peds  Hematology negative hematology ROS (+)   Anesthesia Other Findings   Reproductive/Obstetrics                             Anesthesia Physical Anesthesia Plan  ASA: II  Anesthesia Plan: Spinal   Post-op Pain Management:    Induction: Intravenous  Airway Management Planned:   Additional Equipment:   Intra-op Plan:   Post-operative Plan:   Informed Consent: I have reviewed the patients History and Physical, chart, labs and discussed the procedure including the risks, benefits and alternatives for the proposed anesthesia with the patient or authorized representative who has indicated his/her understanding and acceptance.     Plan Discussed with:   Anesthesia Plan Comments:         Anesthesia Quick Evaluation

## 2016-02-16 NOTE — Transfer of Care (Signed)
Immediate Anesthesia Transfer of Care Note  Patient: Autumn Frye  Procedure(s) Performed: Procedure(s): COMPUTER ASSISTED TOTAL KNEE ARTHROPLASTY (Left)  Patient Location: PACU  Anesthesia Type:Spinal  Level of Consciousness: awake, alert  and oriented  Airway & Oxygen Therapy: Patient Spontanous Breathing  Post-op Assessment: Report given to RN and Post -op Vital signs reviewed and stable  Post vital signs: Reviewed and stable  Last Vitals:  Filed Vitals:   02/16/16 1007  BP: 129/81  Pulse: 95  Temp: 36.8 C  Resp: 16    Last Pain:  Filed Vitals:   02/16/16 1009  PainSc: 6          Complications: No apparent anesthesia complications

## 2016-02-16 NOTE — H&P (Signed)
The patient has been re-examined, and the chart reviewed, and there have been no interval changes to the documented history and physical.    The risks, benefits, and alternatives have been discussed at length. The patient expressed understanding of the risks benefits and agreed with plans for surgical intervention.  James P. Hooten, Jr. M.D.    

## 2016-02-17 ENCOUNTER — Encounter: Payer: Self-pay | Admitting: Orthopedic Surgery

## 2016-02-17 LAB — CBC
HCT: 37 % (ref 35.0–47.0)
HEMOGLOBIN: 12.4 g/dL (ref 12.0–16.0)
MCH: 29 pg (ref 26.0–34.0)
MCHC: 33.5 g/dL (ref 32.0–36.0)
MCV: 86.4 fL (ref 80.0–100.0)
Platelets: 300 10*3/uL (ref 150–440)
RBC: 4.28 MIL/uL (ref 3.80–5.20)
RDW: 14.3 % (ref 11.5–14.5)
WBC: 12.2 10*3/uL — ABNORMAL HIGH (ref 3.6–11.0)

## 2016-02-17 LAB — BASIC METABOLIC PANEL
Anion gap: 6 (ref 5–15)
BUN: 11 mg/dL (ref 6–20)
CHLORIDE: 108 mmol/L (ref 101–111)
CO2: 22 mmol/L (ref 22–32)
CREATININE: 0.61 mg/dL (ref 0.44–1.00)
Calcium: 8.3 mg/dL — ABNORMAL LOW (ref 8.9–10.3)
GFR calc non Af Amer: >60 mL/min (ref >60)
Glucose, Bld: 114 mg/dL — ABNORMAL HIGH (ref 65–99)
Potassium: 4.1 mmol/L (ref 3.5–5.1)
SODIUM: 136 mmol/L (ref 135–145)

## 2016-02-17 MED ORDER — TRAMADOL HCL 50 MG PO TABS: 50.0000 mg | ORAL_TABLET | ORAL | Status: DC | PRN

## 2016-02-17 MED ORDER — OXYCODONE HCL 5 MG PO TABS: 5.0000 mg | ORAL_TABLET | ORAL | Status: DC | PRN

## 2016-02-17 MED ORDER — ENOXAPARIN SODIUM 40 MG/0.4ML ~~LOC~~ SOLN: 40.0000 mg | SUBCUTANEOUS | Status: DC

## 2016-02-17 NOTE — Progress Notes (Signed)
Pt dangled out of bed with assist, tolerated well. Encouraged and educated to perform incentive spirometry while awake. Both heels kept elevated off bed. Will continue to monitor.

## 2016-02-17 NOTE — Discharge Summary (Signed)
Physician Discharge Summary  Patient ID: Autumn Frye MRN: [id number] DOB/AGE: [birth year] 55 y.o.  Admit date: 02/16/2016 Discharge date: 02/19/2016  Admission Diagnoses:  OSTEOARTHRITIS   Discharge Diagnoses: Patient Active Problem List   Diagnosis Date Noted  . S/P total knee arthroplasty 02/16/2016  . Rheumatoid arthritis ([place]) 11/14/2015  . Medicare annual wellness visit, subsequent 04/24/2015  . Axillary hidradenitis suppurativa 07/05/2014  . Sjogren's disease ([place]) 07/05/2014  . Medication management 03/26/2014  . Mixed hyperlipidemia 09/24/2013  . Prediabetes 09/24/2013  . Vitamin D deficiency 09/24/2013  . Intrinsic asthma   . Hypertension   . Hypothyroidism   . Arthritis, degenerative 05/23/2008  . Degeneration of intervertebral disc of lumbosacral region 05/23/2008    Past Medical History  Diagnosis Date  . GERD (gastroesophageal reflux disease)   . Asthma   . Hyperlipidemia   . Hypertension   . Hypothyroidism   . Arthritis     OA, RA  . Cancer ([place])     thyroid-1995  . Back pain   . Hiatal hernia   . Autoimmune deficiency syndrome ([place])   . Pneumonia      Transfusion: Autovac transfusions was received first 6 hours postoperatively.   Consultants (if any):  case management for home health assistance  Discharged Condition: Improved  Hospital Course: Autumn Frye is an 55 y.o. female who was admitted 02/16/2016 with a diagnosis of degenerative arthrosis Left knee and went to the operating room on 02/16/2016 and underwent the above named procedures.    Surgeries:Procedure(s): COMPUTER ASSISTED TOTAL KNEE ARTHROPLASTY on 02/16/2016  PRE-OPERATIVE DIAGNOSIS: Degenerative arthrosis of the left knee superimposed on rheumatoid arthritis  POST-OPERATIVE DIAGNOSIS: Same  PROCEDURE: Left total knee arthroplasty using computer-assisted navigation  SURGEON: Marciano Sequin. M.D.  ASSISTANT: Vance Peper, PA (present and scrubbed throughout the  case, critical for assistance with exposure, retraction, instrumentation, and closure)  ANESTHESIA: spinal  ESTIMATED BLOOD LOSS: 50 mL  FLUIDS REPLACED: 1400 mL of crystalloid  TOURNIQUET TIME: 146 minutes  DRAINS: 2 medium drains to a reinfusion system  SOFT TISSUE RELEASES: Anterior cruciate ligament, posterior cruciate ligament, deep medial collateral ligament, patellofemoral ligament   IMPLANTS UTILIZED: DePuy Attune size 5N (narrow) posterior stabilized femoral component (cemented), size 3 rotating platform tibial component (cemented), 32 mm medialized dome patella (cemented), and a 5 mm stabilized rotating platform polyethylene insert.  INDICATIONS FOR SURGERY: Autumn Frye is a 55 y.o. year old female with a long history of progressive knee pain. She has a history of rheumatoid arthritis. She also has a previous history of extensor mechanism realignment. X-rays demonstrated severe degenerative changes in tricompartmental fashion. The patient had not seen any significant improvement despite conservative nonsurgical intervention. After discussion of the risks and benefits of surgical intervention, the patient expressed understanding of the risks benefits and agree with plans for total knee arthroplasty.   The risks, benefits, and alternatives were discussed at length including but not limited to the risks of infection, bleeding, nerve injury, stiffness, blood clots, the need for revision surgery, cardiopulmonary complications, among others, and they were willing to proceed. Patient tolerated the surgery well. No complications .Patient was taken to PACU where she was stabilized and then transferred to the orthopedic floor.  Patient started on Lovenox 30 q 12 hrs. Foot pumps applied bilaterally at 80 mm hg. Heels elevated off bed with rolled towels. No evidence of DVT. Calves non tender. Negative Homan. Physical therapy started on day #1 for gait training and transfer with OT  starting on   day #1 for ADL and assisted devices. Patient has done well with therapy. Ambulated greater than 200 feet upon being discharged. Was able to ascend and descend 4 steps safely and independently   Patient's IV and Foley were discontinued on day #1 with Hemovac being discontinued on day #2. Dressing was changed on day #2 prior to discharge   She was given perioperative antibiotics:  Anti-infectives    Start     Dose/Rate Route Frequency Ordered Stop   02/16/16 1800  clindamycin (CLEOCIN) IVPB 600 mg     600 mg 100 mL/hr over 30 Minutes Intravenous Every 6 hours 02/16/16 1750 02/17/16 1759   02/16/16 1007  clindamycin (CLEOCIN) 900 MG/50ML IVPB    Comments:  Phillips Grout: cabinet override      02/16/16 1007 02/16/16 2214   02/16/16 0207  clindamycin (CLEOCIN) IVPB 900 mg     900 mg 100 mL/hr over 30 Minutes Intravenous On call to O.R. 02/16/16 0207 02/16/16 1215    .  She was Fitted with AV 1 compression foot pump devices, instructed on heel pumps, early ambulation, and fitted with TED stockings bilaterally for DVT prophylaxis.  She benefited maximally from the hospital stay and there were no complications.    Recent vital signs:  Filed Vitals:   02/17/16 0521 02/17/16 0725  BP: 108/58 109/59  Pulse: 75 71  Temp: 98 F (36.7 C) 97.9 F (36.6 C)  Resp: 18 18    Recent laboratory studies:  Lab Results  Component Value Date   HGB 12.4 02/17/2016   HGB 13.6 02/05/2016   HGB 13.6 11/14/2015   Lab Results  Component Value Date   WBC 12.2* 02/17/2016   PLT 300 02/17/2016   Lab Results  Component Value Date   INR 0.97 02/05/2016   Lab Results  Component Value Date   NA 136 02/17/2016   K 4.1 02/17/2016   CL 108 02/17/2016   CO2 22 02/17/2016   BUN 11 02/17/2016   CREATININE 0.61 02/17/2016   GLUCOSE 114* 02/17/2016    Discharge Medications:     Medication List    STOP taking these medications        aspirin EC 81 MG tablet     HUMIRA PEN 40 MG/0.8ML Pnkt   Generic drug:  Adalimumab      TAKE these medications        amitriptyline 25 MG tablet  Commonly known as:  ELAVIL  Take 50 mg by mouth at bedtime. 1 po a.m. 3 po qhs     atorvastatin 80 MG tablet  Commonly known as:  LIPITOR  TAKE 1 TABLET(80 MG) BY MOUTH DAILY AT 6 PM     AVENOVA 0.01 % Soln  Apply to eyelids BID     AVENOVA 0.01 % Soln     BUTRANS 10 MCG/HR Ptwk patch  Generic drug:  buprenorphine  Place 10 mcg onto the skin once a week.     chlorhexidine 0.12 % solution  Commonly known as:  PERIDEX     CITRACAL + D PO  Take 1 tablet by mouth daily. 1200 mg ER     clindamycin 150 MG capsule  Commonly known as:  CLEOCIN     clindamycin 150 MG capsule  Commonly known as:  CLEOCIN  TK 4 CS PO ONE HOUR BEFORE DENTAL APPOINTMENT     CRANBERRY CONCENTRATE PO  Take 1 capsule by mouth daily.     enoxaparin 40 MG/0.4ML injection  Commonly known as:  LOVENOX  Inject 0.4 mLs (40 mg total) into the skin daily.     Estradiol Acetate 0.1 MG/24HR Ring  Insert vaginally every 3 months     Fish Oil 1200 MG Caps  Take 1 capsule by mouth daily.     furosemide 40 MG tablet  Commonly known as:  LASIX  TAKE 1 TABLET BY MOUTH THREE TIMES DAILY FOR FLUID RETENTION     LOTEMAX 0.5 % Gel  Generic drug:  Loteprednol Etabonate  Place 1 drop into both eyes 3 (three) times daily.     LOTEMAX 0.5 % Gel  Generic drug:  Loteprednol Etabonate  Apply to eye.     Magnesium Oxide 500 MG Tabs  Take 500 mg by mouth daily.     NONFORMULARY OR COMPOUNDED ITEM  Take 900 mg by mouth daily.     oxyCODONE 5 MG immediate release tablet  Commonly known as:  Oxy IR/ROXICODONE  Take 1-2 tablets (5-10 mg total) by mouth every 4 (four) hours as needed for severe pain or breakthrough pain.     PREVIDENT 5000 SENSITIVE 1.1-5 % Pste  Generic drug:  Sod Fluoride-Potassium Nitrate     PREVIDENT 5000 SENSITIVE 1.1-5 % Pste  Generic drug:  Sod Fluoride-Potassium Nitrate     PROAIR HFA 108 (90  Base) MCG/ACT inhaler  Generic drug:  albuterol  Inhale into the lungs.     PROAIR HFA 108 (90 Base) MCG/ACT inhaler  Generic drug:  albuterol  Inhale 2 puffs into the lungs every 6 (six) hours as needed for wheezing or shortness of breath.     albuterol (2.5 MG/3ML) 0.083% nebulizer solution  Commonly known as:  PROVENTIL  Take 3 mLs (2.5 mg total) by nebulization every 6 (six) hours as needed for wheezing or shortness of breath.     RESTASIS 0.05 % ophthalmic emulsion  Generic drug:  cycloSPORINE     RESTASIS 0.05 % ophthalmic emulsion  Generic drug:  cycloSPORINE     SYNTHROID 150 MCG tablet  Generic drug:  levothyroxine  TAKE 1 TABLET BY MOUTH EVERY DAY     SYSTANE 0.4-0.3 % Soln  Generic drug:  Polyethyl Glycol-Propyl Glycol  Apply 1 drop to eye 4 (four) times daily.     traMADol 50 MG tablet  Commonly known as:  ULTRAM  Take 1-2 tablets (50-100 mg total) by mouth every 4 (four) hours as needed for moderate pain.     Vitamin D3 5000 units Tabs  Take 5,000 mg by mouth daily.        Diagnostic Studies: Dg Knee Left Port  02/16/2016  CLINICAL DATA:  Patient status post left knee arthroplasty. EXAM: PORTABLE LEFT KNEE - 1-2 VIEW COMPARISON:  None. FINDINGS: Postsurgical changes compatible with left knee arthroplasty. Hardware appears in appropriate position. No evidence for acute osseous abnormality. Overlying surgical staple line. Gas within the overlying soft tissues compatible with postoperative state. Multiple soft tissue drains. IMPRESSION: Patient status post left knee arthroplasty. Hardware appears in appropriate position. Electronically Signed   By: Lovey Newcomer M.D.   On: 02/16/2016 17:21    Disposition: 01-Home or Self Care      Discharge Instructions    Diet - low sodium heart healthy    Complete by:  As directed      Increase activity slowly    Complete by:  As directed            Follow-up Information    Follow up with  WOLFE,JON R., PA On 03/02/2016.    Specialty:  Physician Assistant   Why:  At 9:45 AM   Contact information:   [address] [facility] [place] [address] [phone]       Follow up with Dereck Leep, MD On 03/30/2016.   Specialty:  Orthopedic Surgery   Why:  At 2:15 PM   Contact information:   [place] [address] [phone]        Signed: Watt Climes 02/17/2016, 7:31 AM

## 2016-02-17 NOTE — Anesthesia Postprocedure Evaluation (Signed)
Anesthesia Post Note  Patient: Autumn Frye  Procedure(s) Performed: Procedure(s) (LRB): COMPUTER ASSISTED TOTAL KNEE ARTHROPLASTY (Left)  Patient location during evaluation: Nursing Unit Anesthesia Type: Spinal Level of consciousness: awake, awake and alert and oriented Pain management: pain level controlled Vital Signs Assessment: post-procedure vital signs reviewed and stable Respiratory status: spontaneous breathing, nonlabored ventilation and respiratory function stable Cardiovascular status: blood pressure returned to baseline Postop Assessment: no headache, no backache and no signs of nausea or vomiting Anesthetic complications: no    Last Vitals:  Filed Vitals:   02/16/16 2226 02/17/16 0521  BP: 146/69 108/58  Pulse: 105 75  Temp: 36.7 C 36.7 C  Resp:  18    Last Pain:  Filed Vitals:   02/17/16 0610  PainSc: 7                  [employer]

## 2016-02-17 NOTE — Clinical Social Work Note (Signed)
CSW consulted for New SNF. CSW reviewed chart. PT is recommending HHPT. CSW updated RNCM, who will follow for discharge planning needs. CSW is signing off as no further needs identified.   Darden Dates, MSW, LCSW  Clinical Social Worker  [phone]

## 2016-02-17 NOTE — Progress Notes (Signed)
   Subjective: 1 Day Post-Op Procedure(s) (LRB): COMPUTER ASSISTED TOTAL KNEE ARTHROPLASTY (Left) Patient reports pain as 4 on 0-10 scale.   Patient is well, and has had no acute complaints or problems We will start therapy today.  Plan is to go Home after hospital stay. no nausea and no vomiting Patient denies any chest pains or shortness of breath. Objective: Vital signs in last 24 hours: Temp:  [96.6 F (35.9 C)-98.4 F (36.9 C)] 98 F (36.7 C) (06/13 0521) Pulse Rate:  [63-105] 75 (06/13 0521) Resp:  [13-19] 18 (06/13 0521) BP: (91-146)/(57-81) 108/58 mmHg (06/13 0521) SpO2:  [97 %-100 %] 99 % (06/13 0521) Weight:  [84.823 kg (187 lb)] 84.823 kg (187 lb) (06/12 1007) Heels are non tender and elevated off the bed using rolled towels   Intake/Output from previous day: 06/12 0701 - 06/13 0700 In: 4120.7 [P.O.:1164; I.V.:2656.7; IV Piggyback:300] Out: 3055 [Urine:2675; Drains:330; Blood:50] Intake/Output this shift:     Recent Labs  02/17/16 0357  HGB 12.4    Recent Labs  02/17/16 0357  WBC 12.2*  RBC 4.28  HCT 37.0  PLT 300    Recent Labs  02/17/16 0357  NA 136  K 4.1  CL 108  CO2 22  BUN 11  CREATININE 0.61  GLUCOSE 114*  CALCIUM 8.3*   No results for input(s): LABPT, INR in the last 72 hours.  EXAM General - Patient is Alert, Appropriate and Oriented Extremity - Neurologically intact Neurovascular intact Sensation intact distally Intact pulses distally Dorsiflexion/Plantar flexion intact Compartment soft Dressing - dressing C/D/I Motor Function - intact, moving foot and toes well on exam. Pt able to do SLR on own  Past Medical History  Diagnosis Date  . GERD (gastroesophageal reflux disease)   . Asthma   . Hyperlipidemia   . Hypertension   . Hypothyroidism   . Arthritis     OA, RA  . Cancer ([place])     thyroid-1995  . Back pain   . Hiatal hernia   . Autoimmune deficiency syndrome ([place])   . Pneumonia     Assessment/Plan: 1 Day  Post-Op Procedure(s) (LRB): COMPUTER ASSISTED TOTAL KNEE ARTHROPLASTY (Left) Active Problems:   S/P total knee arthroplasty  Estimated body mass index is 32.6 kg/(m^2) as calculated from the following:   Height as of this encounter: 5' 3.5" (1.613 m).   Weight as of this encounter: 84.823 kg (187 lb). Advance diet Up with therapy D/C IV fluids Plan for discharge tomorrow Discharge home with home health  Labs: reviewed DVT Prophylaxis - Lovenox, Foot Pumps and TED hose Weight-Bearing as tolerated to left leg D/C O2 and Pulse OX and try on Room [employer] tomorrow am Begin working on a bowel movement  Renard Caperton R. [place] 02/17/2016, 7:23 AM

## 2016-02-17 NOTE — Evaluation (Signed)
Occupational Therapy Evaluation Patient Details Name: Autumn Frye MRN: [id number] DOB: 03/28/1961 Today's Date: 02/17/2016    History of Present Illness Pt. is a 55 y.o. female who was admitted for a Left TKR. Pt. has a History of RA.   Clinical Impression   Pt. Is a 55 y.o. Female who was admitted for a Left TKR. Pt presents with limited ROM, Pain, weakness, and impaired functional mobility which hinder her ability to complete ADL and IADL tasks. Pt. could benefit from skilled OT services to review A/E use for LE ADLs, to review necessary home modifications, and to improve functional mobility for ADL/IADLs in order to work towards regaining Independence with ADL/IADLs.     Follow Up Recommendations  No OT follow up    Equipment Recommendations       Recommendations for Other Services PT consult     Precautions / Restrictions Precautions Precautions: Fall Restrictions Weight Bearing Restrictions: Yes LLE Weight Bearing: Weight bearing as tolerated      Mobility Bed Mobility Overal bed mobility: Needs Assistance Bed Mobility: Supine to Sit     Supine to sit: Min guard     General bed mobility comments: safe technique with bed mobility. Pt needs guidance for sequencing and is able to sit at EOB with supervision.  Transfers Overall transfer level: Needs assistance Equipment used: Rolling walker (2 wheeled) Transfers: Sit to/from Stand Sit to Stand: Min guard           Balance Overall balance assessment: Needs assistance Sitting-balance support: Feet supported Sitting balance-Leahy Scale: Normal     Standing balance support: Bilateral upper extremity supported Standing balance-Leahy Scale: Normal                              ADL Overall ADL's : Needs assistance/impaired Eating/Feeding: Independent;Set up   Grooming: Set up               Lower Body Dressing: Minimal assistance               Functional mobility during ADLs:  Min guard       Vision     Perception     Praxis      Pertinent Vitals/Pain Pain Assessment: 0-10 Pain Score: 8  Pain Location: L knee Pain Descriptors / Indicators: Operative site guarding Pain Intervention(s): Limited activity within patient's tolerance;Patient requesting pain meds-RN notified;Ice applied     Hand Dominance     Extremity/Trunk Assessment Upper Extremity Assessment Upper Extremity Assessment: Overall WFL for tasks assessed (Pt. has a left wrist brace.)       Communication Communication Communication: No difficulties   Cognition Arousal/Alertness: Awake/alert Behavior During Therapy: WFL for tasks assessed/performed Overall Cognitive Status: Within Functional Limits for tasks assessed                     General Comments       Exercises   Shoulder Instructions     Home Living Family/patient expects to be discharged to:: Private residence Living Arrangements: Spouse/significant other;Children Available Help at Discharge: Family Type of Home: House Home Access: Stairs to enter Technical brewer of Steps: 2 Entrance Stairs-Rails: None Home Layout: One level     Bathroom Shower/Tub: Occupational psychologist: Standard Bathroom Accessibility: Yes   Home Equipment: Cane - single point          Prior Functioning/Environment Level of Independence: Independent  Comments: Driving    OT Diagnosis: Generalized weakness;Acute pain   OT Problem List: Decreased strength;Decreased range of motion;Impaired UE functional use;Pain;Impaired balance (sitting and/or standing);Decreased activity tolerance   OT Treatment/Interventions:      OT Goals(Current goals can be found in the care plan section) Acute Rehab OT Goals Patient Stated Goal: to get stronger  OT Frequency:     Barriers to D/C:            Co-evaluation              End of Session Equipment Utilized During Treatment: Gait belt  Activity  Tolerance: Patient tolerated treatment well;Patient limited by pain;Patient limited by fatigue Patient left: in bed;with call bell/phone within reach;with bed alarm set   Time:  -    Charges:  OT General Charges $OT Visit: 1 Procedure OT Evaluation $OT Eval Moderate Complexity: 1 Procedure OT Treatments $Self Care/Home Management : 8-22 mins G-Codes:    Harrel Carina, MS, OTR/L Harrel Carina 02/17/2016, 12:07 PM

## 2016-02-17 NOTE — Progress Notes (Signed)
Physical Therapy Treatment Patient Details Name: Autumn Frye MRN: [id number] DOB: 09-04-1961 Today's Date: 02/17/2016    History of Present Illness Pt. is a 55 y.o. female who was admitted for a Left TKR. Pt. has a History of RA.    PT Comments    Pt is making good progress towards goals with increased ambulation distance this date. Pt with improved fluid gait pattern with reciprocal gait. Pt demonstrates upright posture with all ambulation. Pt limited by wrist pain, wrist brace donned. Good endurance with there-ex. Pt continues to be motivated to perform therapy.  Follow Up Recommendations  Home health PT     Equipment Recommendations  Rolling walker with 5" wheels;3in1 (PT)    Recommendations for Other Services       Precautions / Restrictions Precautions Precautions: Fall Restrictions Weight Bearing Restrictions: Yes LLE Weight Bearing: Weight bearing as tolerated    Mobility  Bed Mobility Overal bed mobility: Modified Independent Bed Mobility: Supine to Sit     Supine to sit: Supervision     General bed mobility comments: safe technique observing pt perform OOB mobility with aide  Transfers Overall transfer level: Needs assistance Equipment used: Rolling walker (2 wheeled) Transfers: Sit to/from Stand Sit to Stand: Supervision         General transfer comment: safe technique. L wrist brace donned. Correct technique with pushing from seated surface. Once standing, pt able to stand with no LOB  Ambulation/Gait Ambulation/Gait assistance: Min guard Ambulation Distance (Feet): 200 Feet Assistive device: Rolling walker (2 wheeled) Gait Pattern/deviations: Step-through pattern     General Gait Details: ambulated using rw and reciprocal gait pattern. Pt with improved gait speed and no LOB.  Pt cued for upright posture   Stairs            Wheelchair Mobility    Modified Rankin (Stroke Patients Only)       Balance                                     Cognition Arousal/Alertness: Awake/alert Behavior During Therapy: WFL for tasks assessed/performed Overall Cognitive Status: Within Functional Limits for tasks assessed                      Exercises Other Exercises Other Exercises: Supine ther-ex performed on L LE including ankle pumps, quad sets, SLRs, hip abd/add, SAQ, and glut sets. All ther-ex performed x 10 reps with cues for safe technique and cga.    General Comments        Pertinent Vitals/Pain Pain Assessment: 0-10 Pain Score: 6  Pain Location: L knee Pain Descriptors / Indicators: Operative site guarding Pain Intervention(s): Limited activity within patient's tolerance    Home Living Family/patient expects to be discharged to:: Private residence Living Arrangements: Spouse/significant other;Children Available Help at Discharge: Family Type of Home: House Home Access: Stairs to enter Entrance Stairs-Rails: None Home Layout: One level Home Equipment: Cane - single point      Prior Function Level of Independence: Independent      Comments: Driving   PT Goals (current goals can now be found in the care plan section) Acute Rehab PT Goals Patient Stated Goal: to get stronger PT Goal Formulation: With patient Time For Goal Achievement: 03/02/16 Potential to Achieve Goals: Good Progress towards PT goals: Progressing toward goals    Frequency  BID    PT Plan Current plan  remains appropriate    Co-evaluation             End of Session Equipment Utilized During Treatment: Gait belt Activity Tolerance: Patient tolerated treatment well Patient left: in bed;with bed alarm set     Time: [id number] PT Time Calculation (min) (ACUTE ONLY): 28 min  Charges:  $Gait Training: 8-22 mins $Therapeutic Exercise: 8-22 mins                    G Codes:      Autumn Frye 02/23/2016, 3:27 PM  Autumn Frye, PT, DPT [phone]

## 2016-02-17 NOTE — Care Management Note (Signed)
Case Management Note  Patient Details  Name: Autumn Frye MRN: [id number] Date of Birth: 1961/01/28  Subjective/Objective:       55yo Mrs Autumn Frye received a left TKA on 02/16/16 by Dr Marry Guan. Hx; Rheumatoid arthritis. PCP=Dr Unk Pinto. Pharmacy=[facility] on [address] in [place] ph: [phone].  Resides at home with her husband and has a supportive daughter and Mother who live close by. Husband will provide transportation to appointments. Has no home assistive equipment. A RW and a BSC were requested from [facility] to be delivered to her room today. No home oxygen. No current home health services. Chose [facility] to be her [facility] services provider. Case management will follow for discharge planning.            Action/Plan:   Expected Discharge Date:                  Expected Discharge Plan:     In-House Referral:     Discharge planning Services     Post Acute Care Choice:    Choice offered to:     DME Arranged:    DME Agency:     HH Arranged:    [place] Agency:     Status of Service:     Medicare Important Message Given:    Date Medicare IM Given:    Medicare IM give by:    Date Additional Medicare IM Given:    Additional Medicare Important Message give by:     If discussed at [place] of Stay Meetings, dates discussed:    Additional Comments:  Kelsey Durflinger A, RN 02/17/2016, 8:35 AM

## 2016-02-17 NOTE — Discharge Instructions (Signed)

## 2016-02-17 NOTE — Evaluation (Signed)
Physical Therapy Evaluation Patient Details Name: Autumn Frye MRN: [id number] DOB: Jul 01, 1961 Today's Date: 02/17/2016   History of Present Illness  Pt is admitted for L TKR. Pt is POD#1 at time of evaluation. Pt with history of previous R TKR.   Clinical Impression  Pt is a pleasant 55 year old female who was admitted for L TKR. Pt performs bed mobility, transfers, and ambulation with cga and rw. Pt is able to perform 10 SLR with cga, therefore does not require KI at this time. Pt demonstrates deficits with strength/pain/mobility. Pt very motivated to perform therapy. Would benefit from skilled PT to address above deficits and promote optimal return to PLOF. Recommend transition to [place] upon discharge from acute hospitalization.       Follow Up Recommendations Home health PT    Equipment Recommendations  Rolling walker with 5" wheels;3in1 (PT)    Recommendations for Other Services       Precautions / Restrictions Precautions Precautions: Fall Restrictions Weight Bearing Restrictions: Yes LLE Weight Bearing: Weight bearing as tolerated      Mobility  Bed Mobility Overal bed mobility: Needs Assistance Bed Mobility: Supine to Sit     Supine to sit: Min guard     General bed mobility comments: safe technique with bed mobility. Pt needs guidance for sequencing and is able to sit at EOB with supervision.  Transfers Overall transfer level: Needs assistance Equipment used: Rolling walker (2 wheeled) Transfers: Sit to/from Stand Sit to Stand: Min guard         General transfer comment: transfers performed with rw and cga. Pt able to demonstrate upright posture. L wrist brace donned prior to transfer with cues given for pushing from seated surface with L hand on RW.  Ambulation/Gait Ambulation/Gait assistance: Min guard Ambulation Distance (Feet): 80 Feet Assistive device: Rolling walker (2 wheeled) Gait Pattern/deviations: Step-through pattern     General Gait  Details: ambulated using rw and step to gait pattern progressing to reciprocal gait pattern. Pt cued for sequencing during turns. Increased pain noted during ambulation, requesting pain meds.  Stairs            Wheelchair Mobility    Modified Rankin (Stroke Patients Only)       Balance Overall balance assessment: Needs assistance Sitting-balance support: Feet supported Sitting balance-Leahy Scale: Normal     Standing balance support: Bilateral upper extremity supported Standing balance-Leahy Scale: Normal                               Pertinent Vitals/Pain Pain Assessment: 0-10 Pain Score: 5  Pain Location: L knee Pain Descriptors / Indicators: Operative site guarding Pain Intervention(s): Limited activity within patient's tolerance;Patient requesting pain meds-RN notified;Ice applied    Home Living Family/patient expects to be discharged to:: Private residence Living Arrangements: Spouse/significant other;Children Available Help at Discharge: Family Type of Home: House Home Access: Stairs to enter Entrance Stairs-Rails: None Technical brewer of Steps: 2 Home Layout: One level Home Equipment: Cane - single point      Prior Function Level of Independence: Independent         Comments: very active     Hand Dominance        Extremity/Trunk Assessment   Upper Extremity Assessment: Overall WFL for tasks assessed           Lower Extremity Assessment: Generalized weakness (L LE grossly 3/5; R LE grossly 5/5)  Communication   Communication: No difficulties  Cognition Arousal/Alertness: Awake/alert Behavior During Therapy: WFL for tasks assessed/performed Overall Cognitive Status: Within Functional Limits for tasks assessed                      General Comments      Exercises Total Joint Exercises Goniometric ROM: L LE AAROM: 1-68 degrees Other Exercises Other Exercises: Supine ther-ex performed on L LE  including ankle pumps, quad sets, SLRs, hip abd/add, and knee flexion stretches. All ther-ex performed x 10 reps with cues for safe technique and cga. Other Exercises: Pt ambulated to sink to perform hygiene and brushing teeth. Pt needs cga for balance and cues for safety.      Assessment/Plan    PT Assessment Patient needs continued PT services  PT Diagnosis Difficulty walking;Abnormality of gait;Generalized weakness;Acute pain   PT Problem List Decreased strength;Decreased range of motion;Decreased balance;Decreased mobility;Pain  PT Treatment Interventions DME instruction;Gait training;Therapeutic exercise   PT Goals (Current goals can be found in the Care Plan section) Acute Rehab PT Goals Patient Stated Goal: to get stronger PT Goal Formulation: With patient Time For Goal Achievement: 03/02/16 Potential to Achieve Goals: Good    Frequency BID   Barriers to discharge        Co-evaluation               End of Session Equipment Utilized During Treatment: Gait belt Activity Tolerance: Patient tolerated treatment well Patient left: in chair;with chair alarm set Nurse Communication: Mobility status         Time: [id number] PT Time Calculation (min) (ACUTE ONLY): 36 min   Charges:   PT Evaluation $PT Eval Moderate Complexity: 1 Procedure PT Treatments $Therapeutic Exercise: 8-22 mins $Therapeutic Activity: 8-22 mins   PT G Codes:        Arline Ketter 2016-03-06, 11:20 AM  Greggory Stallion, PT, DPT [phone]

## 2016-02-18 LAB — CBC
HEMATOCRIT: 33.5 % — AB (ref 35.0–47.0)
HEMOGLOBIN: 11.3 g/dL — AB (ref 12.0–16.0)
MCH: 29.4 pg (ref 26.0–34.0)
MCHC: 33.8 g/dL (ref 32.0–36.0)
MCV: 86.9 fL (ref 80.0–100.0)
Platelets: 278 10*3/uL (ref 150–440)
RBC: 3.86 MIL/uL (ref 3.80–5.20)
RDW: 14.6 % — ABNORMAL HIGH (ref 11.5–14.5)
WBC: 11.5 10*3/uL — AB (ref 3.6–11.0)

## 2016-02-18 LAB — BASIC METABOLIC PANEL
Anion gap: 8 (ref 5–15)
BUN: 13 mg/dL (ref 6–20)
CALCIUM: 8.3 mg/dL — AB (ref 8.9–10.3)
CO2: 24 mmol/L (ref 22–32)
CREATININE: 0.65 mg/dL (ref 0.44–1.00)
Chloride: 104 mmol/L (ref 101–111)
Glucose, Bld: 109 mg/dL — ABNORMAL HIGH (ref 65–99)
Potassium: 3.7 mmol/L (ref 3.5–5.1)
SODIUM: 136 mmol/L (ref 135–145)

## 2016-02-18 NOTE — Progress Notes (Addendum)
Physical Therapy Treatment Patient Details Name: Autumn Frye MRN: [id number] DOB: 1961-05-17 Today's Date: 02/18/2016    History of Present Illness Pt. is a 55 y.o. female who was admitted for a Left TKR. Pt. has a History of RA.    PT Comments    Pt is making good progress towards goals with increased ambulation performed this date with decreased pain level. Still limited in further distance secondary to pain. Pt with improved AAROM on L LE and continues to be motivated to perform therapy. Good endurance with there-ex and ambulation.  Follow Up Recommendations  Home health PT     Equipment Recommendations  Rolling walker with 5" wheels;3in1 (PT)    Recommendations for Other Services       Precautions / Restrictions Precautions Precautions: Fall Restrictions Weight Bearing Restrictions: Yes LLE Weight Bearing: Weight bearing as tolerated    Mobility  Bed Mobility Overal bed mobility: Modified Independent Bed Mobility: Supine to Sit     Supine to sit: Modified independent (Device/Increase time)     General bed mobility comments: safe technique with improved ease this session. Once seated at EOB, pt able to sit with independence  Transfers Overall transfer level: Needs assistance Equipment used: Rolling walker (2 wheeled) Transfers: Sit to/from Stand Sit to Stand: Min guard         General transfer comment: L wrist brace donned. Cues from pushing from seated surface. Increased pain with standing, however no LOB noted  Ambulation/Gait Ambulation/Gait assistance: Min guard Ambulation Distance (Feet): 120 Feet Assistive device: Rolling walker (2 wheeled) Gait Pattern/deviations: Step-through pattern     General Gait Details: ambulated using rw and reciprocal gait pattern. Improved gait pattern noted with increased fluid sequencing. Decreased pain this PM, therefore pt able to weightbear on L LE with ease. Pt fatigues with increased distance   Stairs            Wheelchair Mobility    Modified Rankin (Stroke Patients Only)       Balance                                    Cognition Arousal/Alertness: Awake/alert Behavior During Therapy: WFL for tasks assessed/performed Overall Cognitive Status: Within Functional Limits for tasks assessed                      Exercises Total Joint Exercises Goniometric ROM: L LE AAROM: 0-78 degrees Other Exercises Other Exercises: Pt performed L LE ther-ex in supine including ankle pumps, quad sets, SLRs, and hip abd/add. Pt also performed knee flexion stretches. All there-ex performed x 15 reps with cues for correct technique and supervision for safety    General Comments        Pertinent Vitals/Pain Pain Assessment: 0-10 Pain Score: 5  Pain Location: L knee Pain Descriptors / Indicators: Operative site guarding Pain Intervention(s): Limited activity within patient's tolerance;Ice applied;Premedicated before session    Home Living                      Prior Function            PT Goals (current goals can now be found in the care plan section) Acute Rehab PT Goals Patient Stated Goal: to get stronger PT Goal Formulation: With patient Time For Goal Achievement: 03/02/16 Potential to Achieve Goals: Good Progress towards PT goals: Progressing toward goals  Frequency  BID    PT Plan Current plan remains appropriate    Co-evaluation             End of Session Equipment Utilized During Treatment: Gait belt Activity Tolerance: Patient tolerated treatment well Patient left: in bed;with bed alarm set     Time: 1400-1429 PT Time Calculation (min) (ACUTE ONLY): 29 min  Charges:  $Gait Training: 8-22 mins $Therapeutic Exercise: 8-22 mins                    G Codes:      Melony Tenpas 03/09/2016, 4:29 PM  Greggory Stallion, PT, DPT [phone]

## 2016-02-18 NOTE — Progress Notes (Signed)
   Subjective: 2 Days Post-Op Procedure(s) (LRB): COMPUTER ASSISTED TOTAL KNEE ARTHROPLASTY (Left) Patient reports pain as 6 on 0-10 scale.   Patient is well, and has had no acute complaints or problems Denies any CP, SOB, ABD pain. We will continue therapy today.  Plan is to go Home after hospital stay.  Objective: Vital signs in last 24 hours: Temp:  [97.8 F (36.6 C)-98.7 F (37.1 C)] 98.7 F (37.1 C) (06/14 0441) Pulse Rate:  [71-104] 104 (06/14 0441) Resp:  [18-20] 18 (06/14 0441) BP: (109-120)/(56-85) 120/85 mmHg (06/14 0441) SpO2:  [97 %-100 %] 97 % (06/14 0441)  Intake/Output from previous day: 06/13 0701 - 06/14 0700 In: 924 [P.O.:924] Out: 210 [Drains:210] Intake/Output this shift:     Recent Labs  02/17/16 0357 02/18/16 0427  HGB 12.4 11.3*    Recent Labs  02/17/16 0357 02/18/16 0427  WBC 12.2* 11.5*  RBC 4.28 3.86  HCT 37.0 33.5*  PLT 300 278    Recent Labs  02/17/16 0357 02/18/16 0427  NA 136 136  K 4.1 3.7  CL 108 104  CO2 22 24  BUN 11 13  CREATININE 0.61 0.65  GLUCOSE 114* 109*  CALCIUM 8.3* 8.3*   No results for input(s): LABPT, INR in the last 72 hours.  EXAM General - Patient is Alert, Appropriate and Oriented Extremity - Neurovascular intact Sensation intact distally Intact pulses distally Dorsiflexion/Plantar flexion intact No cellulitis present Dressing - dressing C/D/I, no drainage and hemovac removed Motor Function - intact, moving foot and toes well on exam.   Past Medical History  Diagnosis Date  . GERD (gastroesophageal reflux disease)   . Asthma   . Hyperlipidemia   . Hypertension   . Hypothyroidism   . Arthritis     OA, RA  . Cancer ([place])     thyroid-1995  . Back pain   . Hiatal hernia   . Autoimmune deficiency syndrome ([place])   . Pneumonia     Assessment/Plan:   2 Days Post-Op Procedure(s) (LRB): COMPUTER ASSISTED TOTAL KNEE ARTHROPLASTY (Left) Active Problems:   S/P total knee  arthroplasty  Estimated body mass index is 32.6 kg/(m^2) as calculated from the following:   Height as of this encounter: 5' 3.5" (1.613 m).   Weight as of this encounter: 84.823 kg (187 lb). Advance diet Up with therapy  Needs BM Plan on discharge to home with HHPT today pending BM and completion of stairs Follow up with [facility] in 2 weeks  DVT Prophylaxis - Lovenox, Foot Pumps and TED hose Weight-Bearing as tolerated to left leg D/C O2 and Pulse OX and try on Room Air  T. Rachelle Hora, PA-C [place] 02/18/2016, 7:07 AM

## 2016-02-18 NOTE — Care Management Important Message (Signed)
Important Message  Patient Details  Name: Autumn Frye MRN: [id number] Date of Birth: 16-Dec-1960   Medicare Important Message Given:  Yes    Juliann Pulse A Reilley Valentine 02/18/2016, 3:33 PM

## 2016-02-18 NOTE — Progress Notes (Signed)
Occupational Therapy Treatment Patient Details Name: Autumn Frye MRN: [id number] DOB: May 21, 1961 Today's Date: 02/18/2016    History of present illness Pt. is a 55 y.o. female who was admitted for a Left TKR. Pt. has a History of RA.   OT comments  Met with patient to discuss ADLs and management of RA in addition to L TKR.  Reviewed use of reacher and sock aid for LB dressing skills for L knee with good progress with independence and needed only min cues but L hand hand increased pain from RA.  Rec further AD in catalog that was reviewed with patient to increase independence in ADLs and protect joints.    Follow Up Recommendations  No OT follow up    Equipment Recommendations       Recommendations for Other Services      Precautions / Restrictions Precautions Precautions: Fall Restrictions Weight Bearing Restrictions: Yes LLE Weight Bearing: Weight bearing as tolerated       Mobility Bed Mobility                  Transfers                      Balance                                   ADL Overall ADL's : Needs assistance/impaired                                       General ADL Comments: Met with patient to discuss how to modifiy ADLs due to RA since L hand and back of wrist increased in pain today with wrist brace in place.  Pt given adaptive equip catalog and rec several devices to assist with ADLs and gardening.      Vision                     Perception     Praxis      Cognition                             Extremity/Trunk Assessment               Exercises     Shoulder Instructions       General Comments      Pertinent Vitals/ Pain          Home Living                                          Prior Functioning/Environment              Frequency       Progress Toward Goals  OT Goals(current goals can now be found in the care plan  section)  Progress towards OT goals: Progressing toward goals  Acute Rehab OT Goals Patient Stated Goal: to get stronger  Plan Discharge plan remains appropriate    Co-evaluation                 End of Session     Activity Tolerance Patient tolerated treatment well   Patient Left in bed;with bed alarm set  Nurse Communication          Time: 6122-4001 OT Time Calculation (min): 25 min  Charges: OT General Charges $OT Visit: 1 Procedure OT Treatments $Self Care/Home Management : 23-37 mins   Chrys Racer, OTR/L ascom [phone] 02/18/2016, 3:34 PM

## 2016-02-18 NOTE — Progress Notes (Addendum)
Physical Therapy Treatment Patient Details Name: Autumn Frye MRN: [id number] DOB: 1961-01-14 Today's Date: 02/18/2016    History of Present Illness Pt. is a 55 y.o. female who was admitted for a Left TKR. Pt. has a History of RA.    PT Comments    Pt is making good progress towards goals with ability to perform stair training this date. Pt is however still limited by pain this session. Pt requested pain meds and requested therapist to call RN. Exercises and ROM to be performed in PM session.  Follow Up Recommendations  Home health PT     Equipment Recommendations  Rolling walker with 5" wheels;3in1 (PT)    Recommendations for Other Services       Precautions / Restrictions Precautions Precautions: Fall Restrictions Weight Bearing Restrictions: Yes LLE Weight Bearing: Weight bearing as tolerated    Mobility  Bed Mobility Overal bed mobility: Needs Assistance Bed Mobility: Supine to Sit     Supine to sit: Min guard     General bed mobility comments: safe technique performed with assistance for supporting L LE when coming off EOB.  Transfers Overall transfer level: Needs assistance Equipment used: Rolling walker (2 wheeled) Transfers: Sit to/from Stand Sit to Stand: Min guard         General transfer comment: L wrist brace donned. Cues from pushing from seated surface. Increased pain with standing, however no LOB noted  Ambulation/Gait Ambulation/Gait assistance: Min guard Ambulation Distance (Feet): 120 Feet Assistive device: Rolling walker (2 wheeled) Gait Pattern/deviations: Step-through pattern     General Gait Details: ambulated with rw and reciprocal gait pattern. Pt with increased pain limited flow of sequencing. Pt with 1 seated rest break for approx 10 seconds.    Stairs Stairs: Yes Stairs assistance: Min guard Stair Management: No rails Number of Stairs: 2 General stair comments: Safe technique with therapist describing stair training prior  to performance. Pt reports she is confident  Wheelchair Mobility    Modified Rankin (Stroke Patients Only)       Balance                                    Cognition Arousal/Alertness: Awake/alert Behavior During Therapy: WFL for tasks assessed/performed Overall Cognitive Status: Within Functional Limits for tasks assessed                      Exercises Total Joint Exercises Goniometric ROM: AAROM to be performed in PM session Other Exercises Other Exercises: Pt prefers ther0ex to be performed in PM session secondary to pain Other Exercises: Pt ambulated to bathroom and was able to perform all hygiene tasks with supervision. L LE rested on bucket to prevent increased pain with flexion.    General Comments        Pertinent Vitals/Pain Pain Assessment: 0-10 Pain Score: 10-Worst pain ever Pain Location: L knee Pain Descriptors / Indicators: Operative site guarding Pain Intervention(s): Limited activity within patient's tolerance;Patient requesting pain meds-RN notified;Ice applied    Home Living                      Prior Function            PT Goals (current goals can now be found in the care plan section) Acute Rehab PT Goals Patient Stated Goal: to get stronger PT Goal Formulation: With patient Time For Goal Achievement: 03/02/16 Potential  to Achieve Goals: Good Progress towards PT goals: Progressing toward goals    Frequency  BID    PT Plan Current plan remains appropriate    Co-evaluation             End of Session Equipment Utilized During Treatment: Gait belt Activity Tolerance: Patient tolerated treatment well Patient left: in chair;with chair alarm set     Time: [phone] PT Time Calculation (min) (ACUTE ONLY): 27 min  Charges:  $Gait Training: 8-22 mins $Therapeutic Exercise: 8-22 mins                    G Codes:      Autumn Frye 15-Mar-2016, 11:06 AM  Greggory Stallion, PT, DPT [phone]

## 2016-02-18 NOTE — Care Management (Addendum)
Spoke with patient and confirmed [facility] agency selection. Lovenox called in and pending cost- message left at [facility] for price check. Patient has not received DME at this time. I have requested from [facility] with [place] for delivery today. Lovenox $0.00. Rolling walker and bedside commode delivered today by [facility].

## 2016-02-19 NOTE — Progress Notes (Signed)
Physical Therapy Treatment Patient Details Name: Autumn Frye MRN: [id number] DOB: 1961/05/07 Today's Date: 02/19/2016    History of Present Illness Pt. is a 55 y.o. female who was admitted for a Left TKR. Pt. has a History of RA.    PT Comments    Pt continues to be motivated and all PT goals addressed. Pt has no questions and feels comfortable going home. Improved ROM and there-ex performed this date. Ambulation continues to be limited by other comorbites. Wrist brace donned for all mobility. Pt continues to be motivated to perform therapy. Pt safe to dc home.  Follow Up Recommendations  Home health PT     Equipment Recommendations   (received equipment)    Recommendations for Other Services       Precautions / Restrictions Precautions Precautions: Fall Restrictions Weight Bearing Restrictions: Yes LLE Weight Bearing: Weight bearing as tolerated    Mobility  Bed Mobility Overal bed mobility: Modified Independent Bed Mobility: Supine to Sit     Supine to sit: Modified independent (Device/Increase time)     General bed mobility comments: safe technique with improved ease this session. Once seated at EOB, pt able to sit with independence  Transfers Overall transfer level: Needs assistance Equipment used: Rolling walker (2 wheeled) Transfers: Sit to/from Stand Sit to Stand: Supervision         General transfer comment: L wirst brace donned. Cues for pushing from seated surface. Increased stiffness noted this date  Ambulation/Gait Ambulation/Gait assistance: Min guard Ambulation Distance (Feet): 120 Feet Assistive device: Rolling walker (2 wheeled) Gait Pattern/deviations: Step-through pattern     General Gait Details: ambulated with slightly antalgic gait. Distance limited secondary to RA in wrist making it difficulty to WB through wrist even with brace donned. Pt with 1 standing rest break secondary to pain. Reciprocal gait pattern performed with increased  speed this session. Upright posture noted   Stairs            Wheelchair Mobility    Modified Rankin (Stroke Patients Only)       Balance                                    Cognition Arousal/Alertness: Awake/alert Behavior During Therapy: WFL for tasks assessed/performed Overall Cognitive Status: Within Functional Limits for tasks assessed                      Exercises Total Joint Exercises Goniometric ROM: L LE AAROM: 0-90 degrees Other Exercises Other Exercises: Supine ther-ex performed on L LE in supine including ankle pumps, quad sets, SLRs, SAQ, and knee flexion stretches. All ther-ex performed with cga and 15 reps.  Other Exercises: Pt ambulated to bathroom and was able to perform all hygiene tasks with supervision. L LE rested on bucket to prevent increased pain with flexion.    General Comments        Pertinent Vitals/Pain Pain Assessment: 0-10 Pain Score: 6  Pain Location: L knee Pain Descriptors / Indicators: Operative site guarding Pain Intervention(s): Limited activity within patient's tolerance    Home Living                      Prior Function            PT Goals (current goals can now be found in the care plan section) Acute Rehab PT Goals Patient Stated Goal: to  get stronger PT Goal Formulation: With patient Time For Goal Achievement: 03/02/16 Potential to Achieve Goals: Good Progress towards PT goals: Progressing toward goals    Frequency  BID    PT Plan Current plan remains appropriate    Co-evaluation             End of Session Equipment Utilized During Treatment: Gait belt Activity Tolerance: Patient tolerated treatment well Patient left: in bed;with bed alarm set (pt requested to take a nap)     Time: 1000-1027 PT Time Calculation (min) (ACUTE ONLY): 27 min  Charges:  $Gait Training: 8-22 mins $Therapeutic Exercise: 8-22 mins                    G Codes:       [name] 02/26/2016, 10:41 AM  Greggory Stallion, PT, DPT [phone]

## 2016-02-19 NOTE — Care Management (Signed)
Patient is discharging to home today. Patient has no questions. DME delivered. Lovenox ready for pick up. [facility] notified of patient discharge today. No further RNCM needs.

## 2016-02-19 NOTE — Consult Note (Signed)
   [facility] CM Inpatient Consult   02/19/2016  LUCINE HILFIKER December 24, 1960 [id number]   Patient screened for potential [place] [facility] services. Patient is eligible for [place]. Electronic medical record reveals there were no identifiable [facility] care management needs at this time. [facility] Care Management services not appropriate at this time. If patient's post hospital needs change please place a [facility] Care Management consult. For questions please contact:   Lary Eckardt RN, [facility] Liaison  [phone]) Business Mobile [phone]) Toll free office

## 2016-02-19 NOTE — Progress Notes (Signed)
Subjective: 3 Days Post-Op Procedure(s) (LRB): COMPUTER ASSISTED TOTAL KNEE ARTHROPLASTY (Left) Patient reports pain as 5 on 0-10 scale.   Patient is well, and has had no acute complaints or problems Denies any CP, SOB, ABD pain. We will continue therapy today.  Plan is to go Home after hospital stay.  Objective: Vital signs in last 24 hours: Temp:  [98.4 F (36.9 C)-99.9 F (37.7 C)] 99.9 F (37.7 C) (06/15 0403) Pulse Rate:  [94-109] 109 (06/15 0403) Resp:  [18-19] 18 (06/15 0403) BP: (131-148)/(61-81) 131/61 mmHg (06/15 0403) SpO2:  [96 %-100 %] 96 % (06/15 0403)  Intake/Output from previous day: 06/14 0701 - 06/15 0700 In: 240 [P.O.:240] Out: -  Intake/Output this shift:     Recent Labs  02/17/16 0357 02/18/16 0427  HGB 12.4 11.3*    Recent Labs  02/17/16 0357 02/18/16 0427  WBC 12.2* 11.5*  RBC 4.28 3.86  HCT 37.0 33.5*  PLT 300 278    Recent Labs  02/17/16 0357 02/18/16 0427  NA 136 136  K 4.1 3.7  CL 108 104  CO2 22 24  BUN 11 13  CREATININE 0.61 0.65  GLUCOSE 114* 109*  CALCIUM 8.3* 8.3*   No results for input(s): LABPT, INR in the last 72 hours.  EXAM General - Patient is Alert, Appropriate and Oriented Extremity - Neurovascular intact Sensation intact distally Intact pulses distally Dorsiflexion/Plantar flexion intact No cellulitis present Dressing - scant bloody drainage Motor Function - intact, moving foot and toes well on exam.   Past Medical History  Diagnosis Date  . GERD (gastroesophageal reflux disease)   . Asthma   . Hyperlipidemia   . Hypertension   . Hypothyroidism   . Arthritis     OA, RA  . Cancer ([place])     thyroid-1995  . Back pain   . Hiatal hernia   . Autoimmune deficiency syndrome ([place])   . Pneumonia     Assessment/Plan:   3 Days Post-Op Procedure(s) (LRB): COMPUTER ASSISTED TOTAL KNEE ARTHROPLASTY (Left) Active Problems:   S/P total knee arthroplasty  Estimated body mass index is 32.6 kg/(m^2) as  calculated from the following:   Height as of this encounter: 5' 3.5" (1.613 m).   Weight as of this encounter: 84.823 kg (187 lb). Advance diet Up with therapy  Pt had a BM yesterday. HR 109 this AM, no fever, WBC 11.5 and trending down. Plan on discharge to home with HHPT today. Follow up with [place] [facility] in 2 weeks for staple removal.  DVT Prophylaxis - Lovenox, Foot Pumps and TED hose Weight-Bearing as tolerated to left leg D/C O2 and Pulse OX and try on [place], PA-C [place] 02/19/2016, 7:34 AM

## 2016-02-20 ENCOUNTER — Ambulatory Visit: Payer: Self-pay | Admitting: Internal Medicine

## 2016-02-21 DIAGNOSIS — M069 Rheumatoid arthritis, unspecified: Secondary | ICD-10-CM | POA: Diagnosis not present

## 2016-02-21 DIAGNOSIS — Z96652 Presence of left artificial knee joint: Secondary | ICD-10-CM | POA: Diagnosis not present

## 2016-02-21 DIAGNOSIS — Z471 Aftercare following joint replacement surgery: Secondary | ICD-10-CM | POA: Diagnosis not present

## 2016-02-21 DIAGNOSIS — I10 Essential (primary) hypertension: Secondary | ICD-10-CM | POA: Diagnosis not present

## 2016-02-21 DIAGNOSIS — M1991 Primary osteoarthritis, unspecified site: Secondary | ICD-10-CM | POA: Diagnosis not present

## 2016-02-21 DIAGNOSIS — Z9181 History of falling: Secondary | ICD-10-CM | POA: Diagnosis not present

## 2016-02-21 DIAGNOSIS — J45909 Unspecified asthma, uncomplicated: Secondary | ICD-10-CM | POA: Diagnosis not present

## 2016-02-23 ENCOUNTER — Other Ambulatory Visit: Payer: Self-pay | Admitting: *Deleted

## 2016-02-23 DIAGNOSIS — J45909 Unspecified asthma, uncomplicated: Secondary | ICD-10-CM | POA: Diagnosis not present

## 2016-02-23 DIAGNOSIS — Z96652 Presence of left artificial knee joint: Secondary | ICD-10-CM | POA: Diagnosis not present

## 2016-02-23 DIAGNOSIS — I10 Essential (primary) hypertension: Secondary | ICD-10-CM | POA: Diagnosis not present

## 2016-02-23 DIAGNOSIS — Z471 Aftercare following joint replacement surgery: Secondary | ICD-10-CM | POA: Diagnosis not present

## 2016-02-23 DIAGNOSIS — M1991 Primary osteoarthritis, unspecified site: Secondary | ICD-10-CM | POA: Diagnosis not present

## 2016-02-23 DIAGNOSIS — Z9181 History of falling: Secondary | ICD-10-CM | POA: Diagnosis not present

## 2016-02-23 DIAGNOSIS — M069 Rheumatoid arthritis, unspecified: Secondary | ICD-10-CM | POA: Diagnosis not present

## 2016-02-23 MED ORDER — ATORVASTATIN CALCIUM 80 MG PO TABS: ORAL_TABLET | ORAL | Status: DC

## 2016-02-25 DIAGNOSIS — Z9181 History of falling: Secondary | ICD-10-CM | POA: Diagnosis not present

## 2016-02-25 DIAGNOSIS — I10 Essential (primary) hypertension: Secondary | ICD-10-CM | POA: Diagnosis not present

## 2016-02-25 DIAGNOSIS — M1991 Primary osteoarthritis, unspecified site: Secondary | ICD-10-CM | POA: Diagnosis not present

## 2016-02-25 DIAGNOSIS — Z96652 Presence of left artificial knee joint: Secondary | ICD-10-CM | POA: Diagnosis not present

## 2016-02-25 DIAGNOSIS — M069 Rheumatoid arthritis, unspecified: Secondary | ICD-10-CM | POA: Diagnosis not present

## 2016-02-25 DIAGNOSIS — Z471 Aftercare following joint replacement surgery: Secondary | ICD-10-CM | POA: Diagnosis not present

## 2016-02-25 DIAGNOSIS — J45909 Unspecified asthma, uncomplicated: Secondary | ICD-10-CM | POA: Diagnosis not present

## 2016-02-27 DIAGNOSIS — I10 Essential (primary) hypertension: Secondary | ICD-10-CM | POA: Diagnosis not present

## 2016-02-27 DIAGNOSIS — M1991 Primary osteoarthritis, unspecified site: Secondary | ICD-10-CM | POA: Diagnosis not present

## 2016-02-27 DIAGNOSIS — Z96652 Presence of left artificial knee joint: Secondary | ICD-10-CM | POA: Diagnosis not present

## 2016-02-27 DIAGNOSIS — Z471 Aftercare following joint replacement surgery: Secondary | ICD-10-CM | POA: Diagnosis not present

## 2016-02-27 DIAGNOSIS — Z9181 History of falling: Secondary | ICD-10-CM | POA: Diagnosis not present

## 2016-02-27 DIAGNOSIS — M069 Rheumatoid arthritis, unspecified: Secondary | ICD-10-CM | POA: Diagnosis not present

## 2016-02-27 DIAGNOSIS — J45909 Unspecified asthma, uncomplicated: Secondary | ICD-10-CM | POA: Diagnosis not present

## 2016-03-01 DIAGNOSIS — Z9181 History of falling: Secondary | ICD-10-CM | POA: Diagnosis not present

## 2016-03-01 DIAGNOSIS — Z471 Aftercare following joint replacement surgery: Secondary | ICD-10-CM | POA: Diagnosis not present

## 2016-03-01 DIAGNOSIS — M1991 Primary osteoarthritis, unspecified site: Secondary | ICD-10-CM | POA: Diagnosis not present

## 2016-03-01 DIAGNOSIS — Z96652 Presence of left artificial knee joint: Secondary | ICD-10-CM | POA: Diagnosis not present

## 2016-03-01 DIAGNOSIS — I10 Essential (primary) hypertension: Secondary | ICD-10-CM | POA: Diagnosis not present

## 2016-03-01 DIAGNOSIS — J45909 Unspecified asthma, uncomplicated: Secondary | ICD-10-CM | POA: Diagnosis not present

## 2016-03-01 DIAGNOSIS — M069 Rheumatoid arthritis, unspecified: Secondary | ICD-10-CM | POA: Diagnosis not present

## 2016-03-03 DIAGNOSIS — M25562 Pain in left knee: Secondary | ICD-10-CM | POA: Diagnosis not present

## 2016-03-03 DIAGNOSIS — M1712 Unilateral primary osteoarthritis, left knee: Secondary | ICD-10-CM | POA: Diagnosis not present

## 2016-03-03 DIAGNOSIS — M25662 Stiffness of left knee, not elsewhere classified: Secondary | ICD-10-CM | POA: Diagnosis not present

## 2016-03-03 DIAGNOSIS — R262 Difficulty in walking, not elsewhere classified: Secondary | ICD-10-CM | POA: Diagnosis not present

## 2016-03-08 DIAGNOSIS — M25662 Stiffness of left knee, not elsewhere classified: Secondary | ICD-10-CM | POA: Diagnosis not present

## 2016-03-08 DIAGNOSIS — M25562 Pain in left knee: Secondary | ICD-10-CM | POA: Diagnosis not present

## 2016-03-08 DIAGNOSIS — M1712 Unilateral primary osteoarthritis, left knee: Secondary | ICD-10-CM | POA: Diagnosis not present

## 2016-03-08 DIAGNOSIS — R262 Difficulty in walking, not elsewhere classified: Secondary | ICD-10-CM | POA: Diagnosis not present

## 2016-03-10 DIAGNOSIS — M25562 Pain in left knee: Secondary | ICD-10-CM | POA: Diagnosis not present

## 2016-03-10 DIAGNOSIS — M1712 Unilateral primary osteoarthritis, left knee: Secondary | ICD-10-CM | POA: Diagnosis not present

## 2016-03-10 DIAGNOSIS — R262 Difficulty in walking, not elsewhere classified: Secondary | ICD-10-CM | POA: Diagnosis not present

## 2016-03-10 DIAGNOSIS — M25662 Stiffness of left knee, not elsewhere classified: Secondary | ICD-10-CM | POA: Diagnosis not present

## 2016-03-12 ENCOUNTER — Ambulatory Visit (INDEPENDENT_AMBULATORY_CARE_PROVIDER_SITE_OTHER): Payer: Medicare Other | Admitting: Internal Medicine

## 2016-03-12 ENCOUNTER — Encounter: Payer: Self-pay | Admitting: Internal Medicine

## 2016-03-12 VITALS — BP 132/80 | HR 90 | Temp 98.2°F | Resp 18 | Ht 63.5 in

## 2016-03-12 DIAGNOSIS — I10 Essential (primary) hypertension: Secondary | ICD-10-CM

## 2016-03-12 DIAGNOSIS — M069 Rheumatoid arthritis, unspecified: Secondary | ICD-10-CM

## 2016-03-12 DIAGNOSIS — R7303 Prediabetes: Secondary | ICD-10-CM

## 2016-03-12 DIAGNOSIS — Z79899 Other long term (current) drug therapy: Secondary | ICD-10-CM

## 2016-03-12 DIAGNOSIS — E559 Vitamin D deficiency, unspecified: Secondary | ICD-10-CM | POA: Diagnosis not present

## 2016-03-12 DIAGNOSIS — E782 Mixed hyperlipidemia: Secondary | ICD-10-CM

## 2016-03-12 DIAGNOSIS — Z96652 Presence of left artificial knee joint: Secondary | ICD-10-CM

## 2016-03-12 DIAGNOSIS — R262 Difficulty in walking, not elsewhere classified: Secondary | ICD-10-CM | POA: Diagnosis not present

## 2016-03-12 DIAGNOSIS — M25562 Pain in left knee: Secondary | ICD-10-CM | POA: Diagnosis not present

## 2016-03-12 DIAGNOSIS — E039 Hypothyroidism, unspecified: Secondary | ICD-10-CM | POA: Diagnosis not present

## 2016-03-12 DIAGNOSIS — M1712 Unilateral primary osteoarthritis, left knee: Secondary | ICD-10-CM | POA: Diagnosis not present

## 2016-03-12 DIAGNOSIS — M25662 Stiffness of left knee, not elsewhere classified: Secondary | ICD-10-CM | POA: Diagnosis not present

## 2016-03-12 LAB — CBC WITH DIFFERENTIAL/PLATELET
BASOS ABS: 0 {cells}/uL (ref 0–200)
Basophils Relative: 0 %
Eosinophils Absolute: 67 cells/uL (ref 15–500)
Eosinophils Relative: 1 %
HEMATOCRIT: 37.7 % (ref 35.0–45.0)
HEMOGLOBIN: 12.3 g/dL (ref 11.7–15.5)
LYMPHS ABS: 2613 {cells}/uL (ref 850–3900)
Lymphocytes Relative: 39 %
MCH: 28.8 pg (ref 27.0–33.0)
MCHC: 32.6 g/dL (ref 32.0–36.0)
MCV: 88.3 fL (ref 80.0–100.0)
MONO ABS: 402 {cells}/uL (ref 200–950)
MPV: 9 fL (ref 7.5–12.5)
Monocytes Relative: 6 %
NEUTROS PCT: 54 %
Neutro Abs: 3618 cells/uL (ref 1500–7800)
Platelets: 479 10*3/uL — ABNORMAL HIGH (ref 140–400)
RBC: 4.27 MIL/uL (ref 3.80–5.10)
RDW: 14.8 % (ref 11.0–15.0)
WBC: 6.7 10*3/uL (ref 3.8–10.8)

## 2016-03-12 LAB — HEMOGLOBIN A1C
Hgb A1c MFr Bld: 5.2 % (ref <5.7)
Mean Plasma Glucose: 103 mg/dL

## 2016-03-12 LAB — TSH: TSH: 0.95 m[IU]/L

## 2016-03-12 MED ORDER — CYCLOBENZAPRINE HCL 10 MG PO TABS: 10.0000 mg | ORAL_TABLET | Freq: Every day | ORAL | Status: DC

## 2016-03-12 MED ORDER — ALBUTEROL SULFATE HFA 108 (90 BASE) MCG/ACT IN AERS: 1.0000 | INHALATION_SPRAY | Freq: Four times a day (QID) | RESPIRATORY_TRACT | Status: DC | PRN

## 2016-03-12 NOTE — Progress Notes (Signed)
Assessment and Plan:  Hypertension:  -Continue medication,  -monitor blood pressure at home.  -Continue DASH diet.   -Reminder to go to the ER if any CP, SOB, nausea, dizziness, severe HA, changes vision/speech, left arm numbness and tingling, and jaw pain.  Cholesterol: -Continue diet and exercise.  -Check cholesterol.   Pre-diabetes: -Continue diet and exercise.  -Check A1C  Vitamin D Def: -continue medications.   Hypothyroidism -TSH -cont levothyroxine  Knee replacement -healing well -cont PT -flexeril prn for knee pain  Rheumatoid arthritis -restart medication once skin has completely closed at surgical incision site.   Continue diet and meds as discussed. Further disposition pending results of labs.  HPI 55 y.o. female  presents for 3 month follow up with hypertension, hyperlipidemia, prediabetes and vitamin D.   Her blood pressure has been controlled at home, today their BP is BP: 132/80 mmHg.   She does not workout. She denies chest pain, shortness of breath, dizziness.   She is on cholesterol medication and denies myalgias. Her cholesterol is at goal. The cholesterol last visit was:   Lab Results  Component Value Date   CHOL 201* 11/14/2015   HDL 56 11/14/2015   LDLCALC 117 11/14/2015   TRIG 139 11/14/2015   CHOLHDL 3.6 11/14/2015     She has been working on diet and exercise for prediabetes, and denies foot ulcerations, hyperglycemia, hypoglycemia , increased appetite, nausea, paresthesia of the feet, polydipsia, polyuria, visual disturbances, vomiting and weight loss. Last A1C in the office was:  Lab Results  Component Value Date   HGBA1C 5.7* 11/14/2015    Patient is on Vitamin D supplement.  Lab Results  Component Value Date   VD25OH 39 11/14/2015     Patient does report that she has been having increased fatigue and muscle aches as she has been off of her rheumatoid arthritis medication.  She reports that she wants to wait for at least a month  before starting a medication.  She is afraid of infection risks after having a total knee arthroscopy.    She did have her left knee replaced 3 weeks ago.   She is doing PT 3 times per week.  No fevers, redness, swelling, or discharge.  Her biggest complaint is that she does have some muscle cramping to the posterior calf.  She has had massage done.  She is wondering whether she can take a muscle relaxer to help with her recovery.  She is taking minimal pain medication.    Current Medications:  Current Outpatient Prescriptions on File Prior to Visit  Medication Sig Dispense Refill  . albuterol (PROVENTIL) (2.5 MG/3ML) 0.083% nebulizer solution Take 3 mLs (2.5 mg total) by nebulization every 6 (six) hours as needed for wheezing or shortness of breath. 60 vial 1  . amitriptyline (ELAVIL) 25 MG tablet Take 50 mg by mouth at bedtime. 1 po a.m. 3 po qhs    . atorvastatin (LIPITOR) 80 MG tablet TAKE 1 TABLET(80 MG) BY MOUTH DAILY AT 6 PM 90 tablet 1  . AVENOVA 0.01 % SOLN Apply to eyelids BID  1  . buprenorphine (BUTRANS) 10 MCG/HR PTWK patch Place 10 mcg onto the skin once a week.    . Calcium Citrate-Vitamin D (CITRACAL + D PO) Take 1 tablet by mouth daily. 1200 mg ER    . chlorhexidine (PERIDEX) 0.12 % solution   1  . Cholecalciferol (VITAMIN D3) 5000 UNITS TABS Take 5,000 mg by mouth daily.    . clindamycin (CLEOCIN)  150 MG capsule TK 4 CS PO ONE HOUR BEFORE DENTAL APPOINTMENT  1  . CRANBERRY CONCENTRATE PO Take 1 capsule by mouth daily.    . cycloSPORINE (RESTASIS) 0.05 % ophthalmic emulsion     . Estradiol Acetate 0.1 MG/24HR RING Insert vaginally every 3 months 1 each 3  . Eyelid Cleansers (AVENOVA) 0.01 % SOLN     . furosemide (LASIX) 40 MG tablet TAKE 1 TABLET BY MOUTH THREE TIMES DAILY FOR FLUID RETENTION (Patient taking differently: TAKE 1 TABLET BY MOUTH daily) 270 tablet 1  . LOTEMAX 0.5 % GEL Place 1 drop into both eyes 3 (three) times daily.   2  . Loteprednol Etabonate (LOTEMAX) 0.5 %  GEL Apply to eye.    . Magnesium Oxide 500 MG TABS Take 500 mg by mouth daily.    . NONFORMULARY OR COMPOUNDED ITEM Take 900 mg by mouth daily.    . Omega-3 Fatty Acids (FISH OIL) 1200 MG CAPS Take 1 capsule by mouth daily.    Vladimir Faster Glycol-Propyl Glycol (SYSTANE) 0.4-0.3 % SOLN Apply 1 drop to eye 4 (four) times daily.    Marland Kitchen PREVIDENT 5000 SENSITIVE 1.1-5 % PSTE     . RESTASIS 0.05 % ophthalmic emulsion   3  . Sod Fluoride-Potassium Nitrate (PREVIDENT 5000 SENSITIVE) 1.1-5 % PSTE     . SYNTHROID 150 MCG tablet TAKE 1 TABLET BY MOUTH EVERY DAY 90 tablet 1  . traMADol (ULTRAM) 50 MG tablet Take 1-2 tablets (50-100 mg total) by mouth every 4 (four) hours as needed for moderate pain. 60 tablet 1   No current facility-administered medications on file prior to visit.    Medical History:  Past Medical History  Diagnosis Date  . GERD (gastroesophageal reflux disease)   . Asthma   . Hyperlipidemia   . Hypertension   . Hypothyroidism   . Arthritis     OA, RA  . Cancer ([place])     thyroid-1995  . Back pain   . Hiatal hernia   . Autoimmune deficiency syndrome ([place])   . Pneumonia     Allergies:  Allergies  Allergen Reactions  . Hydrocodone Itching  . Morphine And Related Itching  . Sulfa Antibiotics Itching    Other reaction(s): Other (See Comments)  . Compazine [Prochlorperazine Edisylate]   . Levaquin [Levofloxacin] Hives  . Lyrica [Pregabalin]     Other reaction(s): Other (See Comments) hallucinations hallucinations  . Ciprofloxacin Hives and Swelling  . Penicillins Itching     Review of Systems:  Review of Systems  Constitutional: Negative for fever, chills and malaise/fatigue.  HENT: Negative for congestion, ear pain and sore throat.   Eyes: Negative.   Respiratory: Negative for cough, shortness of breath and wheezing.   Cardiovascular: Negative for chest pain, palpitations and leg swelling.  Gastrointestinal: Negative for heartburn, abdominal pain, diarrhea,  constipation, blood in stool and melena.  Genitourinary: Negative.   Musculoskeletal: Positive for joint pain.  Skin: Negative.   Neurological: Negative for dizziness, sensory change, loss of consciousness and headaches.  Psychiatric/Behavioral: Negative for depression. The patient is not nervous/anxious and does not have insomnia.     Family history- Review and unchanged  Social history- Review and unchanged  Physical Exam: BP 132/80 mmHg  Pulse 90  Temp(Src) 98.2 F (36.8 C) (Temporal)  Resp 18  Ht 5' 3.5" (1.613 m)  LMP  (LMP Unknown) Wt Readings from Last 3 Encounters:  02/16/16 187 lb (84.823 kg)  02/05/16 200 lb (90.719 kg)  02/04/16 200 lb (90.719 kg)    General Appearance: Well nourished well developed, in no apparent distress. Eyes: PERRLA, EOMs, conjunctiva no swelling or erythema ENT/Mouth: Ear canals normal without obstruction, swelling, erythma, discharge.  TMs normal bilaterally.  Oropharynx moist, clear, without exudate, or postoropharyngeal swelling. Neck: Supple, thyroid normal,no cervical adenopathy  Respiratory: Respiratory effort normal, Breath sounds clear A&P without rhonchi, wheeze, or rale.  No retractions, no accessory usage. Cardio: RRR with no MRGs. Brisk peripheral pulses without edema.  Abdomen: Soft, + BS,  Non tender, no guarding, rebound, hernias, masses. Musculoskeletal: Full ROM, 5/5 strength, Normal gait.  Well healing incision to the left knee.  Minimal joint effusion present.  Limited knee flexion. Skin: Warm, dry without rashes, lesions, ecchymosis.  Neuro: Awake and oriented X 3, Cranial nerves intact. Normal muscle tone, no cerebellar symptoms. Psych: Normal affect, Insight and Judgment appropriate.    Starlyn Skeans, PA-C 11:30 AM [facility]

## 2016-03-13 LAB — BASIC METABOLIC PANEL WITH GFR
BUN: 11 mg/dL (ref 7–25)
CALCIUM: 9.5 mg/dL (ref 8.6–10.4)
CO2: 26 mmol/L (ref 20–31)
CREATININE: 0.65 mg/dL (ref 0.50–1.05)
Chloride: 101 mmol/L (ref 98–110)
GFR, Est Non African American: >89 mL/min (ref >=60)
GLUCOSE: 89 mg/dL (ref 65–99)
Potassium: 4.5 mmol/L (ref 3.5–5.3)
SODIUM: 136 mmol/L (ref 135–146)

## 2016-03-13 LAB — LIPID PANEL
CHOLESTEROL: 244 mg/dL — AB (ref 125–200)
HDL: 60 mg/dL (ref >=46)
LDL Cholesterol: 165 mg/dL — ABNORMAL HIGH (ref <130)
TRIGLYCERIDES: 96 mg/dL (ref <150)
Total CHOL/HDL Ratio: 4.1 Ratio (ref <=5.0)
VLDL: 19 mg/dL (ref <30)

## 2016-03-13 LAB — HEPATIC FUNCTION PANEL
ALT: 35 U/L — AB (ref 6–29)
AST: 20 U/L (ref 10–35)
Albumin: 4.1 g/dL (ref 3.6–5.1)
Alkaline Phosphatase: 65 U/L (ref 33–130)
BILIRUBIN DIRECT: 0.1 mg/dL (ref <=0.2)
Indirect Bilirubin: 0.3 mg/dL (ref 0.2–1.2)
TOTAL PROTEIN: 7.1 g/dL (ref 6.1–8.1)
Total Bilirubin: 0.4 mg/dL (ref 0.2–1.2)

## 2016-03-15 DIAGNOSIS — R262 Difficulty in walking, not elsewhere classified: Secondary | ICD-10-CM | POA: Diagnosis not present

## 2016-03-15 DIAGNOSIS — M25562 Pain in left knee: Secondary | ICD-10-CM | POA: Diagnosis not present

## 2016-03-15 DIAGNOSIS — M25662 Stiffness of left knee, not elsewhere classified: Secondary | ICD-10-CM | POA: Diagnosis not present

## 2016-03-15 DIAGNOSIS — M1712 Unilateral primary osteoarthritis, left knee: Secondary | ICD-10-CM | POA: Diagnosis not present

## 2016-03-17 DIAGNOSIS — M25662 Stiffness of left knee, not elsewhere classified: Secondary | ICD-10-CM | POA: Diagnosis not present

## 2016-03-17 DIAGNOSIS — M25562 Pain in left knee: Secondary | ICD-10-CM | POA: Diagnosis not present

## 2016-03-17 DIAGNOSIS — R262 Difficulty in walking, not elsewhere classified: Secondary | ICD-10-CM | POA: Diagnosis not present

## 2016-03-17 DIAGNOSIS — M1712 Unilateral primary osteoarthritis, left knee: Secondary | ICD-10-CM | POA: Diagnosis not present

## 2016-03-19 ENCOUNTER — Encounter: Payer: Self-pay | Admitting: Internal Medicine

## 2016-03-19 ENCOUNTER — Ambulatory Visit (INDEPENDENT_AMBULATORY_CARE_PROVIDER_SITE_OTHER): Payer: Medicare Other | Admitting: Internal Medicine

## 2016-03-19 VITALS — BP 118/66 | HR 94 | Temp 98.0°F | Resp 18 | Ht 63.5 in

## 2016-03-19 DIAGNOSIS — M545 Low back pain, unspecified: Secondary | ICD-10-CM

## 2016-03-19 DIAGNOSIS — N390 Urinary tract infection, site not specified: Secondary | ICD-10-CM | POA: Diagnosis not present

## 2016-03-19 MED ORDER — KETOROLAC TROMETHAMINE 60 MG/2ML IM SOLN
60.0000 mg | Freq: Once | INTRAMUSCULAR | Status: AC
  Administered 2016-03-19: 60 mg via INTRAMUSCULAR

## 2016-03-19 MED ORDER — TRAMADOL HCL 50 MG PO TABS: 50.0000 mg | ORAL_TABLET | ORAL | Status: DC | PRN

## 2016-03-19 NOTE — Progress Notes (Signed)
Subjective:    Patient ID: Autumn Frye, female    DOB: 14-Dec-1960, 55 y.o.   MRN: [id number]  HPI  Patient presents to the office for evaluation of right sided low back pain which started yesterday.  She reports that the pain is intense and she feels like she cannot wipe her own backside.  She has not had any injury that she can think of.  She has been doing physical therapy.  She reports that the twisting motions are the worse.  She reports that she is using a cane in her right hand.  She reports that she has been using her muscle relaxer at night.  She did feel like the muscle relaxer helped with her back too.  No history of kidney stones in the past.  She did take tramadol last night and also use the butrans patch.  She feels like the butrans patch is not helping.  No saddle anesthesias, no loss of bladder or bowel.  NO IV drug use.  No history of cancer.  No bilateral leg pain.    Review of Systems  Constitutional: Positive for chills. Negative for fever and fatigue.  Gastrointestinal: Negative for nausea, vomiting, abdominal pain, diarrhea and constipation.  Genitourinary: Negative for dysuria, urgency, frequency, hematuria, vaginal bleeding, vaginal discharge and vaginal pain.  Musculoskeletal: Positive for back pain.       Objective:   Physical Exam  Constitutional: She is oriented to person, place, and time. She appears well-developed and well-nourished. No distress.  HENT:  Head: Normocephalic.  Mouth/Throat: Oropharynx is clear and moist. No oropharyngeal exudate.  Eyes: Conjunctivae are normal. No scleral icterus.  Neck: Normal range of motion. Neck supple. No JVD present. No thyromegaly present.  Cardiovascular: Normal rate, regular rhythm, normal heart sounds and intact distal pulses.  Exam reveals no gallop and no friction rub.   No murmur heard. Pulmonary/Chest: Effort normal and breath sounds normal. No respiratory distress. She has no wheezes. She has no rales. She  exhibits no tenderness.  Abdominal: Soft. Normal appearance and bowel sounds are normal. She exhibits no distension and no mass. There is no tenderness. There is no rebound, no guarding and no CVA tenderness.  Musculoskeletal: Normal range of motion.  Patient rises slowly from sitting to standing.  They walk without an antalgic gait.  There is no evidence of erythema, ecchymosis, or gross deformity.  There is tenderness to palpation over left lumbar paraspinal muscles.  NO tenderness to palpation of the bony spine.  Active ROM is limited due to pain.  Sensation to light touch is intact over all extremities.  Strength is symmetric and equal in all extremities.    Well healing incision noted to the left anterior knee.      Lymphadenopathy:    She has no cervical adenopathy.  Neurological: She is alert and oriented to person, place, and time. No cranial nerve deficit. Coordination normal.  Skin: Skin is warm and dry. She is not diaphoretic.  Psychiatric: She has a normal mood and affect. Her behavior is normal. Judgment and thought content normal.  Nursing note and vitals reviewed.   Filed Vitals:   03/19/16 1029  BP: 118/66  Pulse: 94  Temp: 98 F (36.7 C)  Resp: 18          Assessment & Plan:    1. Left-sided low back pain without sciatica -no red flags on exam -given toradol 60 mg here -increase frequency of muscle relaxers -use heat prn -cont  tramadol prn -likely from walking with a cane and physical therapy for her knee replacement.   - Urinalysis, Routine w reflex microscopic (not at [facility]) - Urine culture

## 2016-03-19 NOTE — Patient Instructions (Signed)

## 2016-03-20 LAB — URINALYSIS, MICROSCOPIC ONLY
BACTERIA UA: NONE SEEN [HPF]
CASTS: NONE SEEN [LPF]
Crystals: NONE SEEN [HPF]
SQUAMOUS EPITHELIAL / LPF: NONE SEEN [HPF] (ref <=5)
WBC, UA: NONE SEEN WBC/HPF (ref <=5)
YEAST: NONE SEEN [HPF]

## 2016-03-20 LAB — URINALYSIS, ROUTINE W REFLEX MICROSCOPIC
BILIRUBIN URINE: NEGATIVE
GLUCOSE, UA: NEGATIVE
Ketones, ur: NEGATIVE
LEUKOCYTES UA: NEGATIVE
Nitrite: NEGATIVE
Protein, ur: NEGATIVE
SPECIFIC GRAVITY, URINE: 1.009 (ref 1.001–1.035)
pH: 7.5 (ref 5.0–8.0)

## 2016-03-22 ENCOUNTER — Encounter: Payer: Self-pay | Admitting: Internal Medicine

## 2016-03-22 ENCOUNTER — Other Ambulatory Visit: Payer: Self-pay | Admitting: Internal Medicine

## 2016-03-22 DIAGNOSIS — M1712 Unilateral primary osteoarthritis, left knee: Secondary | ICD-10-CM | POA: Diagnosis not present

## 2016-03-22 DIAGNOSIS — R262 Difficulty in walking, not elsewhere classified: Secondary | ICD-10-CM | POA: Diagnosis not present

## 2016-03-22 DIAGNOSIS — M25562 Pain in left knee: Secondary | ICD-10-CM | POA: Diagnosis not present

## 2016-03-22 DIAGNOSIS — M25662 Stiffness of left knee, not elsewhere classified: Secondary | ICD-10-CM | POA: Diagnosis not present

## 2016-03-22 LAB — URINE CULTURE

## 2016-03-22 MED ORDER — NITROFURANTOIN MONOHYD MACRO 100 MG PO CAPS: 100.0000 mg | ORAL_CAPSULE | Freq: Two times a day (BID) | ORAL | Status: DC

## 2016-03-23 DIAGNOSIS — Z96652 Presence of left artificial knee joint: Secondary | ICD-10-CM | POA: Diagnosis not present

## 2016-03-24 DIAGNOSIS — M25662 Stiffness of left knee, not elsewhere classified: Secondary | ICD-10-CM | POA: Diagnosis not present

## 2016-03-24 DIAGNOSIS — M25562 Pain in left knee: Secondary | ICD-10-CM | POA: Diagnosis not present

## 2016-03-24 DIAGNOSIS — M1712 Unilateral primary osteoarthritis, left knee: Secondary | ICD-10-CM | POA: Diagnosis not present

## 2016-03-24 DIAGNOSIS — R262 Difficulty in walking, not elsewhere classified: Secondary | ICD-10-CM | POA: Diagnosis not present

## 2016-03-25 DIAGNOSIS — M0609 Rheumatoid arthritis without rheumatoid factor, multiple sites: Secondary | ICD-10-CM | POA: Diagnosis not present

## 2016-03-25 DIAGNOSIS — M25562 Pain in left knee: Secondary | ICD-10-CM | POA: Diagnosis not present

## 2016-03-25 DIAGNOSIS — M25572 Pain in left ankle and joints of left foot: Secondary | ICD-10-CM | POA: Diagnosis not present

## 2016-03-25 DIAGNOSIS — M797 Fibromyalgia: Secondary | ICD-10-CM | POA: Diagnosis not present

## 2016-03-26 DIAGNOSIS — M25562 Pain in left knee: Secondary | ICD-10-CM | POA: Diagnosis not present

## 2016-03-26 DIAGNOSIS — M25662 Stiffness of left knee, not elsewhere classified: Secondary | ICD-10-CM | POA: Diagnosis not present

## 2016-03-26 DIAGNOSIS — R262 Difficulty in walking, not elsewhere classified: Secondary | ICD-10-CM | POA: Diagnosis not present

## 2016-03-26 DIAGNOSIS — M1712 Unilateral primary osteoarthritis, left knee: Secondary | ICD-10-CM | POA: Diagnosis not present

## 2016-04-07 DIAGNOSIS — Z471 Aftercare following joint replacement surgery: Secondary | ICD-10-CM | POA: Diagnosis not present

## 2016-04-26 ENCOUNTER — Ambulatory Visit (INDEPENDENT_AMBULATORY_CARE_PROVIDER_SITE_OTHER): Payer: Medicare Other | Admitting: Internal Medicine

## 2016-04-26 ENCOUNTER — Encounter: Payer: Self-pay | Admitting: Internal Medicine

## 2016-04-26 VITALS — BP 130/72 | HR 106 | Temp 98.0°F | Resp 18 | Ht 63.5 in | Wt 195.0 lb

## 2016-04-26 DIAGNOSIS — R300 Dysuria: Secondary | ICD-10-CM

## 2016-04-26 MED ORDER — FOSFOMYCIN TROMETHAMINE 3 G PO PACK: 3.0000 g | PACK | Freq: Once | ORAL | 0 refills | Status: AC

## 2016-04-26 MED ORDER — NITROFURANTOIN MONOHYD MACRO 100 MG PO CAPS: 100.0000 mg | ORAL_CAPSULE | Freq: Every day | ORAL | 0 refills | Status: AC

## 2016-04-26 NOTE — Progress Notes (Signed)
   Subjective:    Patient ID: Autumn Frye, female    DOB: 03-15-1961, 55 y.o.   MRN: [id number]  HPI  Patient presents to the office for dysuria x 1 day. She recently had a UTI which was treated with macrobid.  She was due for a UA.  She has been drinking more water and has also been taking cranberry.  Last culture was positive for Staph.  No blood in urine.  There is positive pelvic fullness and heaviness.  She has had some frequency and urgency.     Review of Systems  Constitutional: Negative for chills, fatigue and fever.  Genitourinary: Positive for dysuria, frequency and urgency. Negative for decreased urine volume, difficulty urinating, hematuria, vaginal bleeding, vaginal discharge and vaginal pain.       Objective:   Physical Exam  Constitutional: She is oriented to person, place, and time. She appears well-developed and well-nourished. No distress.  HENT:  Head: Normocephalic.  Mouth/Throat: Oropharynx is clear and moist. No oropharyngeal exudate.  Eyes: Conjunctivae are normal. No scleral icterus.  Neck: Normal range of motion. Neck supple. No JVD present. No thyromegaly present.  Cardiovascular: Normal rate, regular rhythm, normal heart sounds and intact distal pulses.  Exam reveals no gallop and no friction rub.   No murmur heard. Pulmonary/Chest: Effort normal and breath sounds normal. No respiratory distress. She has no wheezes. She has no rales. She exhibits no tenderness.  Abdominal: Soft. Normal appearance and bowel sounds are normal. She exhibits no distension and no mass. There is tenderness in the suprapubic area. There is no rebound, no guarding and no CVA tenderness.  Musculoskeletal: Normal range of motion.  Lymphadenopathy:    She has no cervical adenopathy.  Neurological: She is alert and oriented to person, place, and time.  Skin: Skin is warm and dry. She is not diaphoretic.  Psychiatric: She has a normal mood and affect. Her behavior is normal. Judgment  and thought content normal.  Nursing note and vitals reviewed.   Vitals:   04/26/16 1455  BP: 130/72  Pulse: (!) 106  Resp: 18  Temp: 98 F (36.7 C)         Assessment & Plan:    1. Dysuria -fosfomycin 3 g today -macrobid 100 mg daily x 90 days as prophylactic treatment given minimal anitbiotics available due to allergies -if still no improvement consider referral to urology - Urinalysis, Routine w reflex microscopic (not at [facility]) - Culture, Urine

## 2016-04-27 LAB — URINALYSIS, ROUTINE W REFLEX MICROSCOPIC
Bilirubin Urine: NEGATIVE
Glucose, UA: NEGATIVE
KETONES UR: NEGATIVE
LEUKOCYTES UA: NEGATIVE
NITRITE: NEGATIVE
PH: 6 (ref 5.0–8.0)
Protein, ur: NEGATIVE
SPECIFIC GRAVITY, URINE: 1.011 (ref 1.001–1.035)

## 2016-04-27 LAB — URINALYSIS, MICROSCOPIC ONLY
BACTERIA UA: NONE SEEN [HPF]
Casts: NONE SEEN [LPF]
Crystals: NONE SEEN [HPF]
RBC / HPF: NONE SEEN RBC/HPF (ref <=2)
SQUAMOUS EPITHELIAL / LPF: NONE SEEN [HPF] (ref <=5)
YEAST: NONE SEEN [HPF]

## 2016-04-28 LAB — URINE CULTURE

## 2016-05-27 ENCOUNTER — Ambulatory Visit: Payer: Self-pay | Admitting: Internal Medicine

## 2016-05-27 ENCOUNTER — Ambulatory Visit (INDEPENDENT_AMBULATORY_CARE_PROVIDER_SITE_OTHER): Payer: Medicare Other | Admitting: Internal Medicine

## 2016-05-27 VITALS — BP 130/82 | HR 96 | Temp 98.0°F | Resp 18 | Ht 63.5 in | Wt 192.0 lb

## 2016-05-27 DIAGNOSIS — N3080 Other cystitis without hematuria: Secondary | ICD-10-CM | POA: Diagnosis not present

## 2016-05-27 DIAGNOSIS — N3090 Cystitis, unspecified without hematuria: Secondary | ICD-10-CM

## 2016-05-27 NOTE — Progress Notes (Signed)
Assessment and Plan:   1. Recurrent cystitis -finish 90 day course of macrobid as prophylaxis - Urinalysis, Routine w reflex microscopic (not at [facility]) - Culture, Urine    HPI 55 y.o.female presents for 1 month follow up of recurrent cystitis.  She reports that she has been doing really well.   She reports that she is starting to feel fabulous.  She reports that she is exercising more frequently and is also eating much better.  Patient reports that they have been doing well.  She did take her fosfomycin   female is taking their medication.  They are not having difficulty with their medications.  They report no adverse reactions.   She reports no further urinary symptoms.  She denies dysuria, hematuria, frequency or urgency.  She is working on weight loss.  She also stopped her hormone replacement therapy in an effort to stop UTIs.    Past Medical History:  Diagnosis Date  . Arthritis    OA, RA  . Asthma   . Autoimmune deficiency syndrome ([place])   . Back pain   . Cancer ([place])    thyroid-1995  . GERD (gastroesophageal reflux disease)   . Hiatal hernia   . Hyperlipidemia   . Hypertension   . Hypothyroidism   . Pneumonia      Allergies  Allergen Reactions  . Hydrocodone Itching  . Morphine And Related Itching  . Sulfa Antibiotics Itching    Other reaction(s): Other (See Comments)  . Compazine [Prochlorperazine Edisylate]   . Levaquin [Levofloxacin] Hives  . Lyrica [Pregabalin]     Other reaction(s): Other (See Comments) hallucinations hallucinations  . Ciprofloxacin Hives and Swelling  . Penicillins Itching      Current Outpatient Prescriptions on File Prior to Visit  Medication Sig Dispense Refill  . albuterol (PROAIR HFA) 108 (90 Base) MCG/ACT inhaler Inhale 1-2 puffs into the lungs every 6 (six) hours as needed for wheezing or shortness of breath. Reported on 03/12/2016 18 g 3  . albuterol (PROVENTIL) (2.5 MG/3ML) 0.083% nebulizer solution Take 3 mLs (2.5 mg total) by  nebulization every 6 (six) hours as needed for wheezing or shortness of breath. 60 vial 1  . amitriptyline (ELAVIL) 25 MG tablet Take 50 mg by mouth at bedtime. 1 po a.m. 3 po qhs    . atorvastatin (LIPITOR) 80 MG tablet TAKE 1 TABLET(80 MG) BY MOUTH DAILY AT 6 PM 90 tablet 1  . AVENOVA 0.01 % SOLN Apply to eyelids BID  1  . buprenorphine (BUTRANS) 10 MCG/HR PTWK patch Place 10 mcg onto the skin once a week.    . Calcium Citrate-Vitamin D (CITRACAL + D PO) Take 1 tablet by mouth daily. 1200 mg ER    . chlorhexidine (PERIDEX) 0.12 % solution   1  . Cholecalciferol (VITAMIN D3) 5000 UNITS TABS Take 5,000 mg by mouth daily.    . clindamycin (CLEOCIN) 150 MG capsule TK 4 CS PO ONE HOUR BEFORE DENTAL APPOINTMENT  1  . CRANBERRY CONCENTRATE PO Take 1 capsule by mouth daily.    . cycloSPORINE (RESTASIS) 0.05 % ophthalmic emulsion     . Estradiol Acetate 0.1 MG/24HR RING Insert vaginally every 3 months 1 each 3  . Eyelid Cleansers (AVENOVA) 0.01 % SOLN     . furosemide (LASIX) 40 MG tablet TAKE 1 TABLET BY MOUTH THREE TIMES DAILY FOR FLUID RETENTION (Patient taking differently: TAKE 1 TABLET BY MOUTH daily) 270 tablet 1  . LOTEMAX 0.5 % GEL Place 1 drop  into both eyes 3 (three) times daily.   2  . Magnesium Oxide 500 MG TABS Take 500 mg by mouth daily.    . nitrofurantoin, macrocrystal-monohydrate, (MACROBID) 100 MG capsule Take 1 capsule (100 mg total) by mouth daily. 90 capsule 0  . NONFORMULARY OR COMPOUNDED ITEM Take 900 mg by mouth daily.    . Omega-3 Fatty Acids (FISH OIL) 1200 MG CAPS Take 1 capsule by mouth daily.    Vladimir Faster Glycol-Propyl Glycol (SYSTANE) 0.4-0.3 % SOLN Apply 1 drop to eye 4 (four) times daily.    Marland Kitchen PREVIDENT 5000 SENSITIVE 1.1-5 % PSTE     . RESTASIS 0.05 % ophthalmic emulsion   3  . Sod Fluoride-Potassium Nitrate (PREVIDENT 5000 SENSITIVE) 1.1-5 % PSTE     . SYNTHROID 150 MCG tablet TAKE 1 TABLET BY MOUTH EVERY DAY 90 tablet 1  . traMADol (ULTRAM) 50 MG tablet Take 1-2  tablets (50-100 mg total) by mouth every 4 (four) hours as needed for moderate pain. 60 tablet 1   No current facility-administered medications on file prior to visit.     ROS: all negative except above.   Physical Exam: Filed Weights   05/27/16 1106  Weight: 192 lb (87.1 kg)   BP 130/82   Pulse 96   Temp 98 F (36.7 C) (Temporal)   Resp 18   Ht 5' 3.5" (1.613 m)   Wt 192 lb (87.1 kg)   LMP  (LMP Unknown)   BMI 33.48 kg/m  General Appearance: Well developed well nourished, non-toxic appearing in no apparent distress. Eyes: PERRLA, EOMs, conjunctiva w/ no swelling or erythema or discharge Sinuses: No Frontal/maxillary tenderness ENT/Mouth: Ear canals clear without swelling or erythema.  TM's normal bilaterally with no retractions, bulging, or loss of landmarks.   Neck: Supple, thyroid normal, no notable JVD  Respiratory: Respiratory effort normal, Clear breath sounds anteriorly and posteriorly bilaterally without rales, rhonchi, wheezing or stridor. No retractions or accessory muscle usage. Cardio: RRR with no MRGs.   Abdomen: Soft, + BS.  Non tender, no guarding, rebound, hernias, masses.  Musculoskeletal: Full ROM, 5/5 strength, normal gait.  Skin: Warm, dry without rashes  Neuro: Awake and oriented X 3, Cranial nerves intact. Normal muscle tone, no cerebellar symptoms. Sensation intact.  Psych: normal affect, Insight and Judgment appropriate.     Starlyn Skeans, PA-C 11:17 AM [facility]

## 2016-05-28 LAB — URINALYSIS, MICROSCOPIC ONLY
Bacteria, UA: NONE SEEN [HPF]
CASTS: NONE SEEN [LPF]
CRYSTALS: NONE SEEN [HPF]
RBC / HPF: NONE SEEN RBC/HPF (ref <=2)
Squamous Epithelial / LPF: NONE SEEN [HPF] (ref <=5)
WBC, UA: NONE SEEN WBC/HPF (ref <=5)
YEAST: NONE SEEN [HPF]

## 2016-05-28 LAB — URINALYSIS, ROUTINE W REFLEX MICROSCOPIC
BILIRUBIN URINE: NEGATIVE
GLUCOSE, UA: NEGATIVE
KETONES UR: NEGATIVE
Leukocytes, UA: NEGATIVE
Nitrite: NEGATIVE
PH: 7 (ref 5.0–8.0)
PROTEIN: NEGATIVE
Specific Gravity, Urine: 1.004 (ref 1.001–1.035)

## 2016-05-28 LAB — URINE CULTURE: Organism ID, Bacteria: NO GROWTH

## 2016-06-18 ENCOUNTER — Ambulatory Visit: Payer: Self-pay | Admitting: Internal Medicine

## 2016-06-21 ENCOUNTER — Other Ambulatory Visit: Payer: Self-pay | Admitting: Internal Medicine

## 2016-07-13 ENCOUNTER — Ambulatory Visit (INDEPENDENT_AMBULATORY_CARE_PROVIDER_SITE_OTHER): Payer: Medicare Other | Admitting: Internal Medicine

## 2016-07-13 ENCOUNTER — Encounter: Payer: Self-pay | Admitting: Internal Medicine

## 2016-07-13 VITALS — BP 124/84 | HR 88 | Temp 97.5°F | Resp 16 | Ht 63.5 in | Wt 192.2 lb

## 2016-07-13 DIAGNOSIS — E782 Mixed hyperlipidemia: Secondary | ICD-10-CM | POA: Diagnosis not present

## 2016-07-13 DIAGNOSIS — I10 Essential (primary) hypertension: Secondary | ICD-10-CM

## 2016-07-13 DIAGNOSIS — R7303 Prediabetes: Secondary | ICD-10-CM | POA: Diagnosis not present

## 2016-07-13 DIAGNOSIS — Z79899 Other long term (current) drug therapy: Secondary | ICD-10-CM | POA: Diagnosis not present

## 2016-07-13 DIAGNOSIS — M069 Rheumatoid arthritis, unspecified: Secondary | ICD-10-CM | POA: Diagnosis not present

## 2016-07-13 DIAGNOSIS — E559 Vitamin D deficiency, unspecified: Secondary | ICD-10-CM | POA: Diagnosis not present

## 2016-07-13 LAB — CBC WITH DIFFERENTIAL/PLATELET
BASOS PCT: 0 %
Basophils Absolute: 0 cells/uL (ref 0–200)
Eosinophils Absolute: 59 cells/uL (ref 15–500)
Eosinophils Relative: 1 %
HEMATOCRIT: 41.8 % (ref 35.0–45.0)
Hemoglobin: 13.9 g/dL (ref 11.7–15.5)
LYMPHS PCT: 49 %
Lymphs Abs: 2891 cells/uL (ref 850–3900)
MCH: 27.9 pg (ref 27.0–33.0)
MCHC: 33.3 g/dL (ref 32.0–36.0)
MCV: 83.8 fL (ref 80.0–100.0)
MONO ABS: 354 {cells}/uL (ref 200–950)
MONOS PCT: 6 %
MPV: 9.1 fL (ref 7.5–12.5)
NEUTROS PCT: 44 %
Neutro Abs: 2596 cells/uL (ref 1500–7800)
PLATELETS: 324 10*3/uL (ref 140–400)
RBC: 4.99 MIL/uL (ref 3.80–5.10)
RDW: 15.6 % — AB (ref 11.0–15.0)
WBC: 5.9 10*3/uL (ref 3.8–10.8)

## 2016-07-13 LAB — HEPATIC FUNCTION PANEL
ALBUMIN: 4.2 g/dL (ref 3.6–5.1)
ALT: 22 U/L (ref 6–29)
AST: 21 U/L (ref 10–35)
Alkaline Phosphatase: 56 U/L (ref 33–130)
BILIRUBIN INDIRECT: 0.3 mg/dL (ref 0.2–1.2)
BILIRUBIN TOTAL: 0.4 mg/dL (ref 0.2–1.2)
Bilirubin, Direct: 0.1 mg/dL (ref <=0.2)
TOTAL PROTEIN: 6.8 g/dL (ref 6.1–8.1)

## 2016-07-13 LAB — BASIC METABOLIC PANEL WITH GFR
BUN: 8 mg/dL (ref 7–25)
CALCIUM: 9.4 mg/dL (ref 8.6–10.4)
CO2: 21 mmol/L (ref 20–31)
Chloride: 104 mmol/L (ref 98–110)
Creat: 0.6 mg/dL (ref 0.50–1.05)
GLUCOSE: 86 mg/dL (ref 65–99)
Potassium: 4.2 mmol/L (ref 3.5–5.3)
Sodium: 138 mmol/L (ref 135–146)

## 2016-07-13 LAB — LIPID PANEL
CHOLESTEROL: 263 mg/dL — AB (ref <200)
HDL: 63 mg/dL (ref >50)
LDL Cholesterol: 175 mg/dL — ABNORMAL HIGH
TRIGLYCERIDES: 126 mg/dL (ref <150)
Total CHOL/HDL Ratio: 4.2 Ratio (ref <5.0)
VLDL: 25 mg/dL (ref <30)

## 2016-07-13 LAB — MAGNESIUM: Magnesium: 1.7 mg/dL (ref 1.5–2.5)

## 2016-07-13 LAB — TSH: TSH: 0.16 mIU/L — ABNORMAL LOW

## 2016-07-13 NOTE — Progress Notes (Signed)
[facility] Unk Pinto, M.D.        Uvaldo Bristle. Silverio Lay, P.A.-C       Starlyn Skeans, P.A.-C  [facility]                [address] [place], [place] [id number] Telephone [phone] Telefax [phone] ______________________________________________________________________     This very nice 55 y.o. MWF presents for 6 month follow up with Hypertension, Hyperlipidemia, Pre-Diabetes, GERD, Hypothyroidism.  and Vitamin D Deficiency. In 2009 patient was dx'd with Rheu Arthritis and is followed by Dr Dossie Der on MTX.      Patient is treated for HTN (2000) & BP has been controlled at home. Today's BP is 124/84. Patient has had no complaints of any cardiac type chest pain, palpitations, dyspnea/orthopnea/PND, dizziness, claudication, or dependent edema.     Hyperlipidemia is not controlled with diet (Intolerant to Statins & Zetia) . Patient denies myalgias or other med SE's. Last Lipids were not at goal: Lab Results  Component Value Date   CHOL 244 (H) 03/12/2016   HDL 60 03/12/2016   LDLCALC 165 (H) 03/12/2016   TRIG 96 03/12/2016   CHOLHDL 4.1 03/12/2016      Also, the patient has history of Morbid Obesity (BMI 33+) and consequent PreDiabetes with A1c 5.9% in 08/2009  and has had no symptoms of reactive hypoglycemia, diabetic polys, paresthesias or visual blurring.  Last A1c was at goal: Lab Results  Component Value Date   HGBA1C 5.2 03/12/2016      Further, the patient also has history of Vitamin D Deficiency  Of 27 in 2009 and 37 in 2016 and supplements vitamin D  Very sporadically without any suspected side-effects. Last vitamin D was still very low: Lab Results  Component Value Date   VD25OH 39 11/14/2015   Current Outpatient Prescriptions on File Prior to Visit  Medication Sig  . albuterol (PROAIR HFA 108 (90 Base) MCG/ACT inhaler Inhale 1-2 puffs into the lungs every 6 (six) hours as needed for  wheezing or shortness of breath. Reported on 03/12/2016  . albuterol (PROVENTIL) (2.5 MG/3ML) 0.083% nebulizer solution Take 3 mLs (2.5 mg total) by nebulization every 6 (six) hours as needed for wheezing or shortness of breath.  Marland Kitchen amitriptyline  25 MG tablet Take 50 mg by mouth at bedtime. 1 po a.m. 3 po qhs  . atorvastatin  80 MG tablet TAKE 1 TABLET(80 MG) BY MOUTH DAILY AT 6 PM  . AVENOVA 0.01 % SOLN Apply to eyelids BID  . buprenorphine (BUTRANS) 10 MCG/HR PTWK patch Place 10 mcg onto the skin once a week.  . Calcium Citrate-Vitamin D (CITRACAL + D Pl (VO) Take 1 tablet by mouth daily. 1200 mg ER  . VITAMIN D 5000 UNITS  Take 5,000 mg by mouth daily.  . clindamycin  150 MG capsule TK 4 CS PO ONE HOUR BEFORE DENTAL APPOINTMENT  . CRANBERRY CONCENTRATE Take 1 capsule by mouth daily.  . Estradiol 0.1 MG/24HR RING Insert vaginally every 3 months  . AVENOVA 0.01 % SOLN Eyelid cleaner  . furosemide  40 MG tablet TAKE 1 TAB daily)  . levothyroxine 150 MCG tablet TAKE 1 TAB EVERY DAY  . LOTEMAX 0.5 % GEL Place 1 drop into both eyes 3  times daily.   . Magnesium Oxide 500 MG  Take 500 mg by mouth daily.  . nitrofurantoin  100 MG capsule Take 1 cap daily.  . Tumeric 900 mg Take daily.  . Omega-3 FISH OIL 1200 MG  Take 1 cap daily.  Carren Rang  SOLN Apply 1 drop to eye 4 (four) times daily.   Allergies  Allergen Reactions  . Hydrocodone Itching  . Morphine And Related Itching  . Sulfa Antibiotics Itching    Other reaction(s): Other (See Comments)  . Compazine [Prochlorperazine Edisylate]   . Levaquin [Levofloxacin] Hives  . Lyrica [Pregabalin]     Other reaction(s): Other (See Comments) hallucinations hallucinations  . Ciprofloxacin Hives and Swelling  . Penicillins Itching   PMHx:   Past Medical History:  Diagnosis Date  . Arthritis    OA, RA  . Asthma   . Autoimmune deficiency syndrome ([place])   . Back pain   . Cancer ([facility])    thyroid-1995  . GERD (gastroesophageal reflux disease)    . Hiatal hernia   . Hyperlipidemia   . Hypertension   . Hypothyroidism   . Pneumonia    Immunization History  Administered Date(s) Administered  . DTaP 09/15/2012, 09/15/2012  . Influenza Whole 09/06/2012  . Influenza-Unspecified 05/07/2014, 05/20/2016  . Pneumococcal Conjugate-13 09/15/2012, 07/05/2014  . Zoster 09/03/2013   Past Surgical History:  Procedure Laterality Date  . ANTERIOR LAT LUMBAR FUSION Left 01/26/2013   Procedure: ANTERIOR LATERAL LUMBAR FUSION 1 LEVEL;  Surgeon: Faythe Ghee, MD;  Location: [facility];  Service: Neurosurgery;  Laterality: Left;  lumbar four-five  . KNEE ARTHROPLASTY Left 02/16/2016   Procedure: COMPUTER ASSISTED TOTAL KNEE ARTHROPLASTY;  Surgeon: Dereck Leep, MD;  Location: [facility] ORS;  Service: Orthopedics;  Laterality: Left;  . KNEE SURGERY     3 on right knee, 1 on left-prior to TKR  . LUMBAR PERCUTANEOUS PEDICLE SCREW 1 LEVEL Left 01/26/2013   Procedure: LUMBAR PERCUTANEOUS PEDICLE SCREW 1 LEVEL;  Surgeon: Faythe Ghee, MD;  Location: [facility];  Service: Neurosurgery;  Laterality: Left;  lumbar four-five  . PILONIDAL CYST EXCISION    . THYROIDECTOMY    . TONSILLECTOMY    . TOTAL KNEE ARTHROPLASTY     right  . UPPER GASTROINTESTINAL ENDOSCOPY    . VAGINAL HYSTERECTOMY     FHx:    Reviewed / unchanged  SHx:    Reviewed / unchanged  Systems Review:  Constitutional: Denies fever, chills, wt changes, headaches, insomnia, fatigue, night sweats, change in appetite. Eyes: Denies redness, blurred vision, diplopia, discharge, itchy, watery eyes.  ENT: Denies discharge, congestion, post nasal drip, epistaxis, sore throat, earache, hearing loss, dental pain, tinnitus, vertigo, sinus pain, snoring.  CV: Denies chest pain, palpitations, irregular heartbeat, syncope, dyspnea, diaphoresis, orthopnea, PND, claudication or edema. Respiratory: denies cough, dyspnea, DOE, pleurisy, hoarseness, laryngitis, wheezing.  Gastrointestinal: Denies  dysphagia, odynophagia, heartburn, reflux, water brash, abdominal pain or cramps, nausea, vomiting, bloating, diarrhea, constipation, hematemesis, melena, hematochezia  or hemorrhoids. Genitourinary: Denies dysuria, frequency, urgency, nocturia, hesitancy, discharge, hematuria or flank pain. Musculoskeletal: Denies arthralgias, myalgias, stiffness, jt. swelling, pain, limping or strain/sprain.  Skin: Denies pruritus, rash, hives, warts, acne, eczema or change in skin lesion(s). Neuro: No weakness, tremor, incoordination, spasms, paresthesia or pain. Psychiatric: Denies confusion, memory loss or sensory loss. Endo: Denies change in weight, skin or hair change.  Heme/Lymph: No excessive bleeding, bruising or enlarged lymph nodes.  Physical Exam BP 124/84   Pulse 88   Temp 97.5 F (36.4 C)   Resp 16   Ht 5' 3.5" (1.613 m)  Wt 192 lb 3.2 oz (87.2 kg)   LMP  (LMP Unknown)   BMI 33.51 kg/m   Appears over nourished and in no distress.  Eyes: PERRLA, EOMs, conjunctiva no swelling or erythema. Sinuses: No frontal/maxillary tenderness ENT/Mouth: EAC's clear, TM's nl w/o erythema, bulging. Nares clear w/o erythema, swelling, exudates. Oropharynx clear without erythema or exudates. Oral hygiene is good. Tongue normal, non obstructing. Hearing intact.  Neck: Supple. Thyroid nl. Car 2+/2+ without bruits, nodes or JVD. Chest: Respirations nl with BS clear & equal w/o rales, rhonchi, wheezing or stridor.  Cor: Heart sounds normal w/ regular rate and rhythm without sig. murmurs, gallops, clicks, or rubs. Peripheral pulses normal and equal  without edema.  Abdomen: Soft & bowel sounds normal. Non-tender w/o guarding, rebound, hernias, masses, or organomegaly.  Lymphatics: Unremarkable.  Musculoskeletal: Full ROM all peripheral extremities, joint stability, 5/5 strength, and normal gait.  Skin: Warm, dry without exposed rashes, lesions or ecchymosis apparent.  Neuro: Cranial nerves intact, reflexes  equal bilaterally. Sensory-motor testing grossly intact. Tendon reflexes grossly intact.  Pysch: Alert & oriented x 3.  Insight and judgement nl & appropriate. No ideations.  Assessment and Plan:   1. Essential hypertension  - Continue medication, monitor blood pressure at home.  - Continue DASH diet. Reminder to go to the ER if any CP,  - OB, nausea, dizziness, severe HA, changes vision/speech,  - left arm numbness and tingling and jaw pain. - TSH  2. Mixed hyperlipidemia  - Continue diet/meds, exercise,& lifestyle modifications.  - Continue monitor periodic cholesterol/liver & renal functions - Lipid panel - TSH  3. Prediabetes  - Continue diet, exercise, lifestyle modifications.  - Monitor appropriate labs. - Hemoglobin A1c - Insulin, random  4. Vitamin D deficiency  - Continue supplementation. - VITAMIN D 25 Hydroxy   5. Rheumatoid arthritis  ([place])   6. Medication management  - CBC with Differential/Platelet - BASIC METABOLIC PANEL WITH GFR - Hepatic function panel - Magnesium       Recommended regular exercise, BP monitoring, weight control, and discussed med and SE's. Recommended labs to assess and monitor clinical status. Further disposition pending results of labs. Over 30 minutes of exam, counseling, chart review was performed

## 2016-07-13 NOTE — Patient Instructions (Signed)

## 2016-07-14 LAB — VITAMIN D 25 HYDROXY (VIT D DEFICIENCY, FRACTURES): Vit D, 25-Hydroxy: 54 ng/mL (ref 30–100)

## 2016-07-14 LAB — INSULIN, RANDOM: INSULIN: 5.9 u[IU]/mL (ref 2.0–19.6)

## 2016-07-14 LAB — HEMOGLOBIN A1C
Hgb A1c MFr Bld: 5.4 % (ref <5.7)
MEAN PLASMA GLUCOSE: 108 mg/dL

## 2016-07-15 DIAGNOSIS — M0609 Rheumatoid arthritis without rheumatoid factor, multiple sites: Secondary | ICD-10-CM | POA: Diagnosis not present

## 2016-07-15 DIAGNOSIS — M25551 Pain in right hip: Secondary | ICD-10-CM | POA: Diagnosis not present

## 2016-07-15 DIAGNOSIS — M25522 Pain in left elbow: Secondary | ICD-10-CM | POA: Diagnosis not present

## 2016-07-15 DIAGNOSIS — Z79899 Other long term (current) drug therapy: Secondary | ICD-10-CM | POA: Diagnosis not present

## 2016-07-15 DIAGNOSIS — M25552 Pain in left hip: Secondary | ICD-10-CM | POA: Diagnosis not present

## 2016-07-15 DIAGNOSIS — M25572 Pain in left ankle and joints of left foot: Secondary | ICD-10-CM | POA: Diagnosis not present

## 2016-07-15 DIAGNOSIS — M25562 Pain in left knee: Secondary | ICD-10-CM | POA: Diagnosis not present

## 2016-07-15 DIAGNOSIS — M797 Fibromyalgia: Secondary | ICD-10-CM | POA: Diagnosis not present

## 2016-08-05 DIAGNOSIS — Z96652 Presence of left artificial knee joint: Secondary | ICD-10-CM | POA: Diagnosis not present

## 2016-08-16 DIAGNOSIS — M25572 Pain in left ankle and joints of left foot: Secondary | ICD-10-CM | POA: Diagnosis not present

## 2016-08-16 DIAGNOSIS — M797 Fibromyalgia: Secondary | ICD-10-CM | POA: Diagnosis not present

## 2016-08-16 DIAGNOSIS — M0609 Rheumatoid arthritis without rheumatoid factor, multiple sites: Secondary | ICD-10-CM | POA: Diagnosis not present

## 2016-08-16 DIAGNOSIS — M25562 Pain in left knee: Secondary | ICD-10-CM | POA: Diagnosis not present

## 2016-08-29 ENCOUNTER — Other Ambulatory Visit: Payer: Self-pay | Admitting: Internal Medicine

## 2016-09-11 ENCOUNTER — Other Ambulatory Visit: Payer: Self-pay | Admitting: Internal Medicine

## 2016-09-20 DIAGNOSIS — N390 Urinary tract infection, site not specified: Secondary | ICD-10-CM | POA: Diagnosis not present

## 2016-09-20 DIAGNOSIS — Z79899 Other long term (current) drug therapy: Secondary | ICD-10-CM | POA: Diagnosis not present

## 2016-09-20 DIAGNOSIS — M797 Fibromyalgia: Secondary | ICD-10-CM | POA: Diagnosis not present

## 2016-09-20 DIAGNOSIS — M0609 Rheumatoid arthritis without rheumatoid factor, multiple sites: Secondary | ICD-10-CM | POA: Diagnosis not present

## 2016-10-08 DIAGNOSIS — S93402A Sprain of unspecified ligament of left ankle, initial encounter: Secondary | ICD-10-CM | POA: Diagnosis not present

## 2016-10-14 ENCOUNTER — Encounter: Payer: Self-pay | Admitting: Physician Assistant

## 2016-10-14 ENCOUNTER — Ambulatory Visit (INDEPENDENT_AMBULATORY_CARE_PROVIDER_SITE_OTHER): Payer: Medicare Other | Admitting: Physician Assistant

## 2016-10-14 VITALS — BP 126/88 | HR 74 | Temp 97.3°F | Resp 14 | Ht 63.5 in | Wt 196.0 lb

## 2016-10-14 DIAGNOSIS — I10 Essential (primary) hypertension: Secondary | ICD-10-CM | POA: Diagnosis not present

## 2016-10-14 DIAGNOSIS — E782 Mixed hyperlipidemia: Secondary | ICD-10-CM | POA: Diagnosis not present

## 2016-10-14 DIAGNOSIS — E559 Vitamin D deficiency, unspecified: Secondary | ICD-10-CM

## 2016-10-14 DIAGNOSIS — M069 Rheumatoid arthritis, unspecified: Secondary | ICD-10-CM | POA: Diagnosis not present

## 2016-10-14 DIAGNOSIS — Z79899 Other long term (current) drug therapy: Secondary | ICD-10-CM | POA: Diagnosis not present

## 2016-10-14 LAB — MAGNESIUM: Magnesium: 1.8 mg/dL (ref 1.5–2.5)

## 2016-10-14 LAB — CBC WITH DIFFERENTIAL/PLATELET
BASOS PCT: 0 %
Basophils Absolute: 0 cells/uL (ref 0–200)
EOS ABS: 0 {cells}/uL — AB (ref 15–500)
EOS PCT: 0 %
HCT: 40.3 % (ref 35.0–45.0)
Hemoglobin: 13.3 g/dL (ref 11.7–15.5)
LYMPHS PCT: 43 %
Lymphs Abs: 2537 cells/uL (ref 850–3900)
MCH: 28.4 pg (ref 27.0–33.0)
MCHC: 33 g/dL (ref 32.0–36.0)
MCV: 85.9 fL (ref 80.0–100.0)
MONOS PCT: 7 %
MPV: 9.3 fL (ref 7.5–12.5)
Monocytes Absolute: 413 cells/uL (ref 200–950)
NEUTROS ABS: 2950 {cells}/uL (ref 1500–7800)
Neutrophils Relative %: 50 %
PLATELETS: 368 10*3/uL (ref 140–400)
RBC: 4.69 MIL/uL (ref 3.80–5.10)
RDW: 15.3 % — AB (ref 11.0–15.0)
WBC: 5.9 10*3/uL (ref 3.8–10.8)

## 2016-10-14 LAB — LIPID PANEL
CHOL/HDL RATIO: 3.8 ratio (ref <5.0)
Cholesterol: 241 mg/dL — ABNORMAL HIGH (ref <200)
HDL: 63 mg/dL (ref >50)
LDL Cholesterol: 154 mg/dL — ABNORMAL HIGH (ref <100)
Triglycerides: 118 mg/dL (ref <150)
VLDL: 24 mg/dL (ref <30)

## 2016-10-14 LAB — BASIC METABOLIC PANEL WITH GFR
BUN: 12 mg/dL (ref 7–25)
CO2: 25 mmol/L (ref 20–31)
CREATININE: 0.66 mg/dL (ref 0.50–1.05)
Calcium: 9.5 mg/dL (ref 8.6–10.4)
Chloride: 102 mmol/L (ref 98–110)
GFR, Est Non African American: >89 mL/min (ref >=60)
Glucose, Bld: 89 mg/dL (ref 65–99)
POTASSIUM: 4.5 mmol/L (ref 3.5–5.3)
SODIUM: 137 mmol/L (ref 135–146)

## 2016-10-14 LAB — HEPATIC FUNCTION PANEL
ALBUMIN: 4.2 g/dL (ref 3.6–5.1)
ALT: 30 U/L — ABNORMAL HIGH (ref 6–29)
AST: 24 U/L (ref 10–35)
Alkaline Phosphatase: 59 U/L (ref 33–130)
BILIRUBIN DIRECT: 0 mg/dL (ref <=0.2)
BILIRUBIN TOTAL: 0.3 mg/dL (ref 0.2–1.2)
Indirect Bilirubin: 0.3 mg/dL (ref 0.2–1.2)
Total Protein: 6.9 g/dL (ref 6.1–8.1)

## 2016-10-14 LAB — TSH: TSH: 0.16 m[IU]/L — AB

## 2016-10-14 NOTE — Patient Instructions (Signed)
Iliotibial Band Syndrome Rehab  Ask your health care provider which exercises are safe for you. Do exercises exactly as told by your health care provider and adjust them as directed. It is normal to feel mild stretching, pulling, tightness, or discomfort as you do these exercises, but you should stop right away if you feel sudden pain or your pain gets worse. Do not begin these exercises until told by your health care provider.  Stretching and range of motion exercises  These exercises warm up your muscles and joints and improve the movement and flexibility of your hip and pelvis.  Exercise A: Quadriceps, prone     1. Lie on your abdomen on a firm surface, such as a bed or padded floor.  2. Bend your left / right knee and hold your ankle. If you cannot reach your ankle or pant leg, loop a belt around your foot and grab the belt instead.  3. Gently pull your heel toward your buttocks. Your knee should not slide out to the side. You should feel a stretch in the front of your thigh and knee.  4. Hold this position for __________ seconds.  Repeat __________ times. Complete this stretch __________ times a day.  Exercise B: Iliotibial band     1. Lie on your side with your left / right leg in the top position.  2. Bend both of your knees and grab your left / right ankle. Stretch out your bottom arm to help you balance.  3. Slowly bring your top knee back so your thigh goes behind your trunk.  4. Slowly lower your top leg toward the floor until you feel a gentle stretch on the outside of your left / right hip and thigh. If you do not feel a stretch and your knee will not fall farther, place the heel of your other foot on top of your knee and pull your knee down toward the floor with your foot.  5. Hold this position for __________ seconds.  Repeat __________ times. Complete this stretch __________ times a day.  Strengthening exercises  These exercises build strength and endurance in your hip and pelvis. Endurance is the  ability to use your muscles for a long time, even after they get tired.  Exercise C: Straight leg raises (   hip abductors)  1. Lie on your side with your left / right leg in the top position. Lie so your head, shoulder, knee, and hip line up. You may bend your bottom knee to help you balance.  2. Roll your hips slightly forward so your hips are stacked directly over each other and your left / right knee is facing forward.  3. Tense the muscles in your outer thigh and lift your top leg 4-6 inches (10-15 cm).  4. Hold this position for __________ seconds.  5. Slowly return to the starting position. Let your muscles relax completely before doing another repetition.  Repeat __________ times. Complete this exercise __________ times a day.  Exercise D: Straight leg raises (  hip extensors)  1. Lie on your abdomen on your bed or a firm surface. You can put a pillow under your hips if that is more comfortable.  2. Bend your left / right knee so your foot is straight up in the air.  3. Squeeze your buttock muscles and lift your left / right thigh off the bed. Do not let your back arch.  4. Tense this muscle as hard as you can without increasing any   knee pain.  5. Hold this position for __________ seconds.  6. Slowly lower your leg to the starting position and allow it to relax completely.  Repeat __________ times. Complete this exercise __________ times a day.  Exercise E: Hip hike  1. Stand sideways on a bottom step. Stand on your left / right leg with your other foot unsupported next to the step. You can hold onto the railing or wall if needed for balance.  2. Keep your knees straight and your torso square. Then, lift your left / right hip up toward the ceiling.  3. Slowly let your left / right hip lower toward the floor, past the starting position. Your foot should get closer to the floor. Do not lean or bend your knees.  Repeat __________ times. Complete this exercise __________ times a day.  This information is not  intended to replace advice given to you by your health care provider. Make sure you discuss any questions you have with your health care provider.  Document Released: 08/23/2005 Document Revised: 04/27/2016 Document Reviewed: 07/25/2015  Elsevier Interactive Patient Education © 2017 [employer]

## 2016-10-14 NOTE — Progress Notes (Signed)
Assessment and Plan:  Hypertension:  -Continue medication,  -monitor blood pressure at home.  -Continue DASH diet.   -Reminder to go to the ER if any CP, SOB, nausea, dizziness, severe HA, changes vision/speech, left arm numbness and tingling, and jaw pain.  Cholesterol: -Continue diet and exercise.  -Check cholesterol.   Vitamin D Def: -continue medications.   Hypothyroidism -TSH -cont levothyroxine  Rheumatoid arthritis -continue follow up  Continue diet and meds as discussed. Further disposition pending results of labs. Future Appointments Date Time Provider [place]  12/21/2016 10:00 AM Unk Pinto, MD [facility] None    HPI 56 y.o. female  presents for 3 month follow up with hypertension, hyperlipidemia, prediabetes and vitamin D.   Her blood pressure has been controlled at home, today their BP is BP: 126/88.   She does not workout. She denies chest pain, shortness of breath, dizziness. She has not smoked x 1 year.   Recovering from norovirus, husband and mother in law at NH gave it to her, she is recovering. Doing well after knee replacement but states that has some decreased ROM left hip, no pain, no swelling, no warmth, redness.     She is on cholesterol medication and denies myalgias. Her cholesterol is at goal. The cholesterol last visit was:   Lab Results  Component Value Date   CHOL 263 (H) 07/13/2016   HDL 63 07/13/2016   LDLCALC 175 (H) 07/13/2016   TRIG 126 07/13/2016   CHOLHDL 4.2 07/13/2016    She has been working on diet and exercise for prediabetes, and denies foot ulcerations, hyperglycemia, hypoglycemia , increased appetite, nausea, paresthesia of the feet, polydipsia, polyuria, visual disturbances, vomiting and weight loss. Last A1C in the office was:  Lab Results  Component Value Date   HGBA1C 5.4 07/13/2016   Patient is on Vitamin D supplement.  Lab Results  Component Value Date   VD25OH 54 07/13/2016     She is on thyroid  medication. Her medication was not changed last visit, one daily.   Lab Results  Component Value Date   TSH 0.16 (L) 07/13/2016  .  BMI is Body mass index is 34.18 kg/m., she is working on diet and exercise. Wt Readings from Last 3 Encounters:  10/14/16 196 lb (88.9 kg)  07/13/16 192 lb 3.2 oz (87.2 kg)  05/27/16 192 lb (87.1 kg)   Current Medications:  Current Outpatient Prescriptions on File Prior to Visit  Medication Sig Dispense Refill  . albuterol (PROAIR HFA) 108 (90 Base) MCG/ACT inhaler Inhale 1-2 puffs into the lungs every 6 (six) hours as needed for wheezing or shortness of breath. Reported on 03/12/2016 18 g 3  . albuterol (PROVENTIL) (2.5 MG/3ML) 0.083% nebulizer solution Take 3 mLs (2.5 mg total) by nebulization every 6 (six) hours as needed for wheezing or shortness of breath. 60 vial 1  . amitriptyline (ELAVIL) 25 MG tablet Take 50 mg by mouth at bedtime. 1 po a.m. 3 po qhs    . atorvastatin (LIPITOR) 80 MG tablet TAKE 1 TABLET(80 MG) BY MOUTH DAILY AT 6 PM 90 tablet 1  . AVENOVA 0.01 % SOLN Apply to eyelids BID  1  . buprenorphine (BUTRANS) 10 MCG/HR PTWK patch Place 10 mcg onto the skin once a week.    . Calcium Citrate-Vitamin D (CITRACAL + D PO) Take 1 tablet by mouth daily. 1200 mg ER    . chlorhexidine (PERIDEX) 0.12 % solution   1  . Cholecalciferol (VITAMIN D3) 5000 UNITS  TABS Take 5,000 mg by mouth daily.    . clindamycin (CLEOCIN) 150 MG capsule TK 4 CS PO ONE HOUR BEFORE DENTAL APPOINTMENT  1  . CRANBERRY CONCENTRATE PO Take 1 capsule by mouth daily.    . cyclobenzaprine (FLEXERIL) 10 MG tablet TAKE 1 TABLET(10 MG) BY MOUTH AT BEDTIME 90 tablet 0  . cycloSPORINE (RESTASIS) 0.05 % ophthalmic emulsion     . Eyelid Cleansers (AVENOVA) 0.01 % SOLN     . FEMRING 0.1 MG/24HR RING INSERT 1 RING VAGINALLY EVERY 3 MONTHS 1 each 3  . furosemide (LASIX) 40 MG tablet TAKE 1 TABLET BY MOUTH THREE TIMES DAILY FOR FLUID RETENTION (Patient taking differently: TAKE 1 TABLET BY  MOUTH daily) 270 tablet 1  . levothyroxine (SYNTHROID, LEVOTHROID) 150 MCG tablet TAKE 1 TABLET BY MOUTH EVERY DAY 90 tablet 1  . LOTEMAX 0.5 % GEL Place 1 drop into both eyes 3 (three) times daily.   2  . Magnesium Oxide 500 MG TABS Take 500 mg by mouth daily.    . NONFORMULARY OR COMPOUNDED ITEM Take 900 mg by mouth daily.    . Omega-3 Fatty Acids (FISH OIL) 1200 MG CAPS Take 1 capsule by mouth daily.    Vladimir Faster Glycol-Propyl Glycol (SYSTANE) 0.4-0.3 % SOLN Apply 1 drop to eye 4 (four) times daily.    Marland Kitchen PREVIDENT 5000 SENSITIVE 1.1-5 % PSTE     . RESTASIS 0.05 % ophthalmic emulsion   3  . Sod Fluoride-Potassium Nitrate (PREVIDENT 5000 SENSITIVE) 1.1-5 % PSTE      No current facility-administered medications on file prior to visit.     Medical History:  Past Medical History:  Diagnosis Date  . Arthritis    OA, RA  . Asthma   . Autoimmune deficiency syndrome ([place])   . Back pain   . Cancer ([place])    thyroid-1995  . GERD (gastroesophageal reflux disease)   . Hiatal hernia   . Hyperlipidemia   . Hypertension   . Hypothyroidism   . Pneumonia     Allergies:  Allergies  Allergen Reactions  . Hydrocodone Itching  . Morphine And Related Itching  . Sulfa Antibiotics Itching    Other reaction(s): Other (See Comments)  . Compazine [Prochlorperazine Edisylate]   . Levaquin [Levofloxacin] Hives  . Lyrica [Pregabalin] Other (See Comments)    Altered mental status Other reaction(s): Other (See Comments) hallucinations hallucinations  . Ciprofloxacin Hives and Swelling  . Penicillins Itching     Review of Systems:  Review of Systems  Constitutional: Negative for chills, fever and malaise/fatigue.  HENT: Negative for congestion, ear pain and sore throat.   Eyes: Negative.   Respiratory: Negative for cough, shortness of breath and wheezing.   Cardiovascular: Negative for chest pain, palpitations and leg swelling.  Gastrointestinal: Negative for abdominal pain, blood in  stool, constipation, diarrhea, heartburn and melena.  Genitourinary: Negative.   Musculoskeletal: Positive for joint pain.  Skin: Negative.   Neurological: Negative for dizziness, sensory change, loss of consciousness and headaches.  Psychiatric/Behavioral: Negative for depression. The patient is not nervous/anxious and does not have insomnia.     Family history- Review and unchanged  Social history- Review and unchanged  Physical Exam: BP 126/88   Pulse 74   Temp 97.3 F (36.3 C)   Resp 14   Ht 5' 3.5" (1.613 m)   Wt 196 lb (88.9 kg)   LMP  (LMP Unknown)   SpO2 96%   BMI 34.18 kg/m  Wt  Readings from Last 3 Encounters:  10/14/16 196 lb (88.9 kg)  07/13/16 192 lb 3.2 oz (87.2 kg)  05/27/16 192 lb (87.1 kg)    General Appearance: Well nourished well developed, in no apparent distress. Eyes: PERRLA, EOMs, conjunctiva no swelling or erythema ENT/Mouth: Ear canals normal without obstruction, swelling, erythma, discharge.  TMs normal bilaterally.  Oropharynx moist, clear, without exudate, or postoropharyngeal swelling. Neck: Supple, thyroid normal,no cervical adenopathy  Respiratory: Respiratory effort normal, Breath sounds clear A&P without rhonchi, wheeze, or rale.  No retractions, no accessory usage. Cardio: RRR with no MRGs. Brisk peripheral pulses without edema.  Abdomen: Soft, + BS,  Non tender, no guarding, rebound, hernias, masses. Musculoskeletal: Full ROM, 5/5 strength, Normal gait.  Left knee good flexion, decreased flexion of left hip, + pain at IT band, decreased internal rotation.  Skin: Warm, dry without rashes, lesions, ecchymosis.  Neuro: Awake and oriented X 3, Cranial nerves intact. Normal muscle tone, no cerebellar symptoms. Psych: Normal affect, Insight and Judgment appropriate.    Vicie Mutters, PA-C 11:21 AM [facility]

## 2016-12-13 ENCOUNTER — Ambulatory Visit (INDEPENDENT_AMBULATORY_CARE_PROVIDER_SITE_OTHER): Payer: Medicare Other

## 2016-12-13 ENCOUNTER — Ambulatory Visit (INDEPENDENT_AMBULATORY_CARE_PROVIDER_SITE_OTHER): Payer: Medicare Other | Admitting: Orthopedic Surgery

## 2016-12-13 ENCOUNTER — Encounter (INDEPENDENT_AMBULATORY_CARE_PROVIDER_SITE_OTHER): Payer: Self-pay | Admitting: Orthopedic Surgery

## 2016-12-13 VITALS — Ht 63.0 in | Wt 196.0 lb

## 2016-12-13 DIAGNOSIS — G8929 Other chronic pain: Secondary | ICD-10-CM | POA: Diagnosis not present

## 2016-12-13 DIAGNOSIS — M25522 Pain in left elbow: Secondary | ICD-10-CM

## 2016-12-13 DIAGNOSIS — M25572 Pain in left ankle and joints of left foot: Secondary | ICD-10-CM

## 2016-12-13 MED ORDER — LIDOCAINE HCL 1 % IJ SOLN
2.0000 mL | INTRAMUSCULAR | Status: AC | PRN
  Administered 2016-12-13: 2 mL

## 2016-12-13 MED ORDER — METHYLPREDNISOLONE ACETATE 40 MG/ML IJ SUSP
40.0000 mg | INTRAMUSCULAR | Status: AC | PRN
  Administered 2016-12-13: 40 mg via INTRA_ARTICULAR

## 2016-12-13 MED ORDER — LIDOCAINE HCL 1 % IJ SOLN
1.0000 mL | INTRAMUSCULAR | Status: AC | PRN
  Administered 2016-12-13: 1 mL

## 2016-12-13 NOTE — Progress Notes (Signed)
Office Visit Note   Patient: Autumn Frye           Date of Birth: 12-Aug-1961           MRN: [id number] Visit Date: 12/13/2016              Requested by: Unk Pinto, MD [address] [place], [place] [address] PCP: Alesia Richards, MD  Chief Complaint  Patient presents with  . Left Ankle - Pain  . Left Elbow - Pain      HPI: Patient is a 56 year old woman with rheumatoid arthritis who recently has started Humira. Patient complains of pain centered around the lateral epicondyle of the left elbow with pain that radiates from her fingers and wrist to the elbow and radiates proximally. Patient states her elbow feels hot. Patient also complains of anterior left ankle pain at this time previously she had subtalar pain in the injection completely relieved the subtalar symptoms. Patient states the ankle is hurting after she fell down 2 steps and radiographs were normal at that time.  Assessment & Plan: Visit Diagnoses:  1. Chronic pain of left ankle   2. Pain in left elbow     Plan: Left elbow lateral condyle was injected and left ankle injected. She will increase her activities as tolerated and follow-up as needed.  Follow-Up Instructions: No Follow-up on file.   Ortho Exam  Patient is alert, oriented, no adenopathy, well-dressed, normal affect, normal respiratory effort. Examination patient has a normal gait. Examination the medial epicondyles nontender to palpation the radial head is nontender with supination or pronation and elbow is not painful with range of motion the elbow she is point tender to palpation of the lateral condyle and resisted extension of the wrist reproduces her pain. Left ankle she is tender to palpation anteriorly. The subtalar joint has no pain with inversion or eversion. She has good pulses.  Imaging: Xr Ankle Complete Left  Result Date: 12/13/2016 Three-view radiographs of the left ankle shows a congruent mortise no joint space  narrowing no effusion no osteochondral defects  Xr Elbow 2 Views Left  Result Date: 12/13/2016 2 view radiographs of the left elbow shows no sign the joint spaces are congruent condylarsclerosisorcysts.   Labs: Lab Results  Component Value Date   HGBA1C 5.4 07/13/2016   HGBA1C 5.2 03/12/2016   HGBA1C 5.7 (H) 11/14/2015   ESRSEDRATE 26 02/05/2016   LABURIC 5.6 11/14/2015   REPTSTATUS 02/06/2016 FINAL 02/05/2016   CULT NO GROWTH Performed at [facility]  02/05/2016   LABORGA NO GROWTH 05/27/2016    Orders:  Orders Placed This Encounter  Procedures  . XR Ankle Complete Left  . XR Elbow 2 Views Left   No orders of the defined types were placed in this encounter.    Procedures: Medium Joint Inj Date/Time: 12/13/2016 11:17 AM Performed by: Breawna Montenegro V Authorized by: Newt Minion   Consent Given by:  Patient Site marked: the procedure site was marked   Timeout: prior to procedure the correct patient, procedure, and site was verified   Indications:  Pain and diagnostic evaluation Location:  Elbow Site:  L lateral epicondyle Prep: patient was prepped and draped in usual sterile fashion   Needle Size:  22 G Needle Length:  1.5 inches Approach:  Anterolateral Ultrasound Guided: No   Fluoroscopic Guidance: No   Medications:  1 mL lidocaine 1 %; 40 mg methylPREDNISolone acetate 40 MG/ML Aspiration Attempted: No   Patient tolerance:  Patient tolerated the procedure well with no immediate complications Medium Joint Inj Date/Time: 12/13/2016 11:18 AM Performed by: Teya Otterson V Authorized by: Newt Minion   Consent Given by:  Patient Site marked: the procedure site was marked   Timeout: prior to procedure the correct patient, procedure, and site was verified   Indications:  Pain and diagnostic evaluation Location:  Ankle Site:  L ankle Prep: patient was prepped and draped in usual sterile fashion   Needle Size:  22 G Needle Length:  1.5 inches Approach:   Anteromedial Ultrasound Guided: No   Fluoroscopic Guidance: No   Medications:  2 mL lidocaine 1 %; 40 mg methylPREDNISolone acetate 40 MG/ML Aspiration Attempted: No   Patient tolerance:  Patient tolerated the procedure well with no immediate complications    Clinical Data: No additional findings.  ROS:  All other systems negative, except as noted in the HPI. Review of Systems  Objective: Vital Signs: Ht 5\' 3"  (1.6 m)   Wt 196 lb (88.9 kg)   LMP  (LMP Unknown)   BMI 34.72 kg/m   Specialty Comments:  No specialty comments available.  PMFS History: Patient Active Problem List   Diagnosis Date Noted  . Chronic pain of left ankle 12/13/2016  . Pain in left elbow 12/13/2016  . S/P total knee arthroplasty 02/16/2016  . Rheumatoid arthritis ([place]) 11/14/2015  . Medicare annual wellness visit, subsequent 04/24/2015  . Axillary hidradenitis suppurativa 07/05/2014  . Sjogren's disease ([place]) 07/05/2014  . Medication management 03/26/2014  . Mixed hyperlipidemia 09/24/2013  . Prediabetes 09/24/2013  . Vitamin D deficiency 09/24/2013  . Intrinsic asthma   . Hypertension   . Hypothyroidism   . Arthritis, degenerative 05/23/2008  . Degeneration of intervertebral disc of lumbosacral region 05/23/2008   Past Medical History:  Diagnosis Date  . Arthritis    OA, RA  . Asthma   . Autoimmune deficiency syndrome ([place])   . Back pain   . Cancer ([place])    thyroid-1995  . GERD (gastroesophageal reflux disease)   . Hiatal hernia   . Hyperlipidemia   . Hypertension   . Hypothyroidism   . Pneumonia     Family History  Problem Relation Age of Onset  . Colon cancer Neg Hx   . Esophageal cancer Neg Hx   . Stomach cancer Neg Hx   . Rectal cancer Neg Hx   . Hypertension Father   . Stroke Father   . Diabetes Brother   . Asthma Brother   . Other Daughter     XED CONNECTIVE TISSUE  . Cancer Maternal Grandmother     BREAST    Past Surgical History:  Procedure Laterality Date    . ANTERIOR LAT LUMBAR FUSION Left 01/26/2013   Procedure: ANTERIOR LATERAL LUMBAR FUSION 1 LEVEL;  Surgeon: Faythe Ghee, MD;  Location: [place] [facility];  Service: Neurosurgery;  Laterality: Left;  lumbar four-five  . KNEE ARTHROPLASTY Left 02/16/2016   Procedure: COMPUTER ASSISTED TOTAL KNEE ARTHROPLASTY;  Surgeon: Dereck Leep, MD;  Location: [facility] ORS;  Service: Orthopedics;  Laterality: Left;  . KNEE SURGERY     3 on right knee, 1 on left-prior to TKR  . LUMBAR PERCUTANEOUS PEDICLE SCREW 1 LEVEL Left 01/26/2013   Procedure: LUMBAR PERCUTANEOUS PEDICLE SCREW 1 LEVEL;  Surgeon: Faythe Ghee, MD;  Location: [facility];  Service: Neurosurgery;  Laterality: Left;  lumbar four-five  . PILONIDAL CYST EXCISION    . THYROIDECTOMY    .  TONSILLECTOMY    . TOTAL KNEE ARTHROPLASTY     right  . UPPER GASTROINTESTINAL ENDOSCOPY    . VAGINAL HYSTERECTOMY     Social History   Occupational History  . Not on file.   Social History Main Topics  . Smoking status: Former Smoker    Packs/day: 0.50    Years: 40.00    Types: Cigarettes    Quit date: 07/05/2015  . Smokeless tobacco: Never Used  . Alcohol use 0.0 oz/week     Comment: rare  . Drug use: No  . Sexual activity: Not on file

## 2016-12-21 ENCOUNTER — Other Ambulatory Visit: Payer: Self-pay | Admitting: Internal Medicine

## 2016-12-21 ENCOUNTER — Encounter: Payer: Self-pay | Admitting: Internal Medicine

## 2016-12-21 DIAGNOSIS — Z1231 Encounter for screening mammogram for malignant neoplasm of breast: Secondary | ICD-10-CM

## 2016-12-24 ENCOUNTER — Ambulatory Visit (INDEPENDENT_AMBULATORY_CARE_PROVIDER_SITE_OTHER): Payer: Medicare Other | Admitting: Physician Assistant

## 2016-12-24 ENCOUNTER — Encounter: Payer: Self-pay | Admitting: Physician Assistant

## 2016-12-24 VITALS — BP 126/84 | HR 103 | Temp 97.7°F | Resp 16 | Ht 63.0 in | Wt 195.8 lb

## 2016-12-24 DIAGNOSIS — Z79899 Other long term (current) drug therapy: Secondary | ICD-10-CM | POA: Diagnosis not present

## 2016-12-24 DIAGNOSIS — M5137 Other intervertebral disc degeneration, lumbosacral region: Secondary | ICD-10-CM | POA: Diagnosis not present

## 2016-12-24 DIAGNOSIS — J45909 Unspecified asthma, uncomplicated: Secondary | ICD-10-CM

## 2016-12-24 DIAGNOSIS — Z0001 Encounter for general adult medical examination with abnormal findings: Secondary | ICD-10-CM

## 2016-12-24 DIAGNOSIS — L732 Hidradenitis suppurativa: Secondary | ICD-10-CM

## 2016-12-24 DIAGNOSIS — E559 Vitamin D deficiency, unspecified: Secondary | ICD-10-CM

## 2016-12-24 DIAGNOSIS — I10 Essential (primary) hypertension: Secondary | ICD-10-CM

## 2016-12-24 DIAGNOSIS — R6889 Other general symptoms and signs: Secondary | ICD-10-CM | POA: Diagnosis not present

## 2016-12-24 DIAGNOSIS — E782 Mixed hyperlipidemia: Secondary | ICD-10-CM | POA: Diagnosis not present

## 2016-12-24 DIAGNOSIS — M3500 Sicca syndrome, unspecified: Secondary | ICD-10-CM | POA: Diagnosis not present

## 2016-12-24 DIAGNOSIS — R7303 Prediabetes: Secondary | ICD-10-CM | POA: Diagnosis not present

## 2016-12-24 DIAGNOSIS — M069 Rheumatoid arthritis, unspecified: Secondary | ICD-10-CM | POA: Diagnosis not present

## 2016-12-24 DIAGNOSIS — M51379 Other intervertebral disc degeneration, lumbosacral region without mention of lumbar back pain or lower extremity pain: Secondary | ICD-10-CM

## 2016-12-24 DIAGNOSIS — E039 Hypothyroidism, unspecified: Secondary | ICD-10-CM

## 2016-12-24 DIAGNOSIS — Z0000 Encounter for general adult medical examination without abnormal findings: Secondary | ICD-10-CM

## 2016-12-24 DIAGNOSIS — J0101 Acute recurrent maxillary sinusitis: Secondary | ICD-10-CM | POA: Diagnosis not present

## 2016-12-24 MED ORDER — PROMETHAZINE-DM 6.25-15 MG/5ML PO SYRP: 5.0000 mL | ORAL_SOLUTION | Freq: Four times a day (QID) | ORAL | 1 refills | Status: DC | PRN

## 2016-12-24 MED ORDER — PREDNISONE 20 MG PO TABS: ORAL_TABLET | ORAL | 0 refills | Status: DC

## 2016-12-24 MED ORDER — AZITHROMYCIN 250 MG PO TABS: ORAL_TABLET | ORAL | 1 refills | Status: DC

## 2016-12-24 NOTE — Patient Instructions (Signed)
Make sure you are on an allergy pill, see below for more details. Please take the prednisone as directed below, this is NOT an antibiotic so you do NOT have to finish it. You can take it for a few days and stop it if you are doing better.   Please take the prednisone to help decrease inflammation and therefore decrease symptoms. Take it it with food to avoid GI upset. It can cause increased energy but on the other hand it can make it hard to sleep at night so please take it AT [place], it takes 8-12 hours to start working so it will NOT affect your sleeping if you take it at night with your food!!  If you are diabetic it will increase your sugars so decrease carbs and monitor your sugars closely.     Costochondritis Costochondritis is swelling and irritation (inflammation) of the tissue (cartilage) that connects your ribs to your breastbone (sternum). This causes pain in the front of your chest. The pain usually starts gradually and involves more than one rib. What are the causes? The exact cause of this condition is not always known. It results from stress on the cartilage where your ribs attach to your sternum. The cause of this stress could be:  Chest injury (trauma).  Exercise or activity, such as lifting.  Severe coughing. What increases the risk? You may be at higher risk for this condition if you:  Are female.  Are [age] years old.  Recently started a new exercise or work activity.  Have low levels of vitamin D.  Have a condition that makes you cough frequently. What are the signs or symptoms? The main symptom of this condition is chest pain. The pain:  Usually starts gradually and can be sharp or dull.  Gets worse with deep breathing, coughing, or exercise.  Gets better with rest.  May be worse when you press on the sternum-rib connection (tenderness). How is this diagnosed? This condition is diagnosed based on your symptoms, medical history, and a physical  exam. Your health care provider will check for tenderness when pressing on your sternum. This is the most important finding. You may also have tests to rule out other causes of chest pain. These may include:  A chest X-ray to check for lung problems.  An electrocardiogram (ECG) to see if you have a heart problem that could be causing the pain.  An imaging scan to rule out a chest or rib fracture. How is this treated? This condition usually goes away on its own over time. Your health care provider may prescribe an NSAID to reduce pain and inflammation. Your health care provider may also suggest that you:  Rest and avoid activities that make pain worse.  Apply heat or cold to the area to reduce pain and inflammation.  Do exercises to stretch your chest muscles. If these treatments do not help, your health care provider may inject a numbing medicine at the sternum-rib connection to help relieve the pain. Follow these instructions at home:  Avoid activities that make pain worse. This includes any activities that use chest, abdominal, and side muscles.  If directed, put ice on the painful area:  Put ice in a plastic bag.  Place a towel between your skin and the bag.  Leave the ice on for 20 minutes, 2-3 times a day.  If directed, apply heat to the affected area as often as told by your health care provider. Use the heat source that  your health care provider recommends, such as a moist heat pack or a heating pad.  Place a towel between your skin and the heat source.  Leave the heat on for 20-30 minutes.  Remove the heat if your skin turns bright red. This is especially important if you are unable to feel pain, heat, or cold. You may have a greater risk of getting burned.  Take over-the-counter and prescription medicines only as told by your health care provider.  Return to your normal activities as told by your health care provider. Ask your health care provider what activities are  safe for you.  Keep all follow-up visits as told by your health care provider. This is important. Contact a health care provider if:  You have chills or a fever.  Your pain does not go away or it gets worse.  You have a cough that does not go away (is persistent). Get help right away if:  You have shortness of breath. This information is not intended to replace advice given to you by your health care provider. Make sure you discuss any questions you have with your health care provider. Document Released: 06/02/2005 Document Revised: 03/12/2016 Document Reviewed: 12/17/2015 Elsevier Interactive Patient Education  2017 [place] VIRAL COUGH AND COLD SYMPTOMS:  -Symptoms usually last at least 1 week with the worst symptoms being around day 4.  - colds usually start with a sore throat and end with a cough, and the cough can take 2 weeks to get better.  -No antibiotics are needed for colds, flu, sore throats, cough, bronchitis UNLESS symptoms are longer than 7 days OR if you are getting better then get drastically worse.  -There are a lot of combination medications (Dayquil, Nyquil, Vicks 44, tyelnol cold and sinus, ETC). Please look at the ingredients on the back so that you are treating the correct symptoms and not doubling up on medications/ingredients.    Medicines you can use  Nasal congestion  - pseudoephedrine (Sudafed)- behind the counter, do not use if you have high blood pressure, medicine that have -D in them.  - phenylephrine (Sudafed PE) -Dextormethorphan + chlorpheniramine (Coridcidin HBP)- okay if you have high blood pressure -Oxymetazoline (Afrin) nasal spray- LIMIT to 3 days -Saline nasal spray -Neti pot (used distilled or bottled water)  Ear pain/congestion  -pseudoephedrine (sudafed) - Nasonex/flonase nasal spray  Fever  -Acetaminophen (Tyelnol) -Ibuprofen (Advil, motrin, aleve)  Sore Throat  -Acetaminophen (Tyelnol) -Ibuprofen (Advil,  motrin, aleve) -Drink a lot of water -Gargle with salt water - Rest your voice (don't talk) -Throat sprays -Cough drops  Body Aches  -Acetaminophen (Tyelnol) -Ibuprofen (Advil, motrin, aleve)  Headache  -Acetaminophen (Tyelnol) -Ibuprofen (Advil, motrin, aleve) - Exedrin, Exedrin Migraine  Allergy symptoms (cough, sneeze, runny nose, itchy eyes) -Claritin or loratadine cheapest but likely the weakest  -Zyrtec or certizine at night because it can make you sleepy -The strongest is allegra or fexafinadine  Cheapest at [employer], [employer], [employer]  Cough  -Dextromethorphan (Delsym)- medicine that has DM in it -Guafenesin (Mucinex/Robitussin) - cough drops - drink lots of water  Chest Congestion  -Guafenesin (Mucinex/Robitussin)  Red Itchy Eyes  - Naphcon-A  Upset Stomach  - Bland diet (nothing spicy, greasy, fried, and high acid foods like tomatoes, oranges, berries) -OKAY- cereal, bread, soup, crackers, rice -Eat smaller more frequent meals -reduce caffeine, no alcohol -Loperamide (Imodium-AD) if diarrhea -Prevacid for heart burn  General health when sick  -Hydration -wash your hands frequently -  keep surfaces clean -change pillow cases and sheets often -Get fresh air but do not exercise strenuously -Vitamin D, double up on it - Vitamin C -Zinc

## 2016-12-24 NOTE — Progress Notes (Signed)
Subjective:    Patient ID: Autumn Frye, female    DOB: 12/23/1960, 56 y.o.   MRN: [id number] MEDICARE ANNUAL WELLNESS VISIT AND ACUTE VISIT  HPI 56 y.o. WF with history of RA, former smoker presents with cough and for wellness visit.  Has had sinus pressure, discharge with green mucus, cough with yellow mucus, no fever or chills. Did have left sided sharp acute chest pain last night, now with sharp pain with cough.   Her blood pressure has been controlled at home, today their BP is BP: 126/84  She does not workout. She denies chest pain, shortness of breath, dizziness. She has history of RA, follows with Dr. Ouida Sills, on DMARDS  She is not on cholesterol medication and denies myalgias. Her cholesterol is not at goal. The cholesterol last visit was:   Lab Results  Component Value Date   CHOL 241 (H) 10/14/2016   HDL 63 10/14/2016   LDLCALC 154 (H) 10/14/2016   TRIG 118 10/14/2016   CHOLHDL 3.8 10/14/2016    She has been working on diet and exercise for prediabetes, and denies paresthesia of the feet, polydipsia, polyuria and visual disturbances. Last A1C in the office was:  Lab Results  Component Value Date   HGBA1C 5.4 07/13/2016   Patient is on Vitamin D supplement.   Lab Results  Component Value Date   VD25OH 54 07/13/2016     BMI is Body mass index is 34.68 kg/m., she is working on diet and exercise. Wt Readings from Last 3 Encounters:  12/24/16 195 lb 12.8 oz (88.8 kg)  12/13/16 196 lb (88.9 kg)  10/14/16 196 lb (88.9 kg)   She is on thyroid medication. Her medication was not changed last visit.   Lab Results  Component Value Date   TSH 0.16 (L) 10/14/2016  .   Blood pressure 126/84, pulse (!) 103, temperature 97.7 F (36.5 C), resp. rate 16, height 5\' 3"  (1.6 m), weight 195 lb 12.8 oz (88.8 kg), SpO2 97 %.  Medications Current Outpatient Prescriptions on File Prior to Visit  Medication Sig  . albuterol (PROAIR HFA) 108 (90 Base) MCG/ACT inhaler Inhale 1-2  puffs into the lungs every 6 (six) hours as needed for wheezing or shortness of breath. Reported on 03/12/2016  . albuterol (PROVENTIL) (2.5 MG/3ML) 0.083% nebulizer solution Take 3 mLs (2.5 mg total) by nebulization every 6 (six) hours as needed for wheezing or shortness of breath.  Marland Kitchen amitriptyline (ELAVIL) 25 MG tablet Take 50 mg by mouth at bedtime. 1 po a.m. 3 po qhs  . atorvastatin (LIPITOR) 80 MG tablet TAKE 1 TABLET(80 MG) BY MOUTH DAILY AT 6 PM  . AVENOVA 0.01 % SOLN Apply to eyelids BID  . buprenorphine (BUTRANS) 10 MCG/HR PTWK patch Place 10 mcg onto the skin once a week.  . Calcium Citrate-Vitamin D (CITRACAL + D PO) Take 1 tablet by mouth daily. 1200 mg ER  . chlorhexidine (PERIDEX) 0.12 % solution   . Cholecalciferol (VITAMIN D3) 5000 UNITS TABS Take 5,000 mg by mouth daily.  . clindamycin (CLEOCIN) 150 MG capsule TK 4 CS PO ONE HOUR BEFORE DENTAL APPOINTMENT  . CRANBERRY CONCENTRATE PO Take 1 capsule by mouth daily.  . cyclobenzaprine (FLEXERIL) 10 MG tablet TAKE 1 TABLET(10 MG) BY MOUTH AT BEDTIME  . cycloSPORINE (RESTASIS) 0.05 % ophthalmic emulsion   . Eyelid Cleansers (AVENOVA) 0.01 % SOLN   . FEMRING 0.1 MG/24HR RING INSERT 1 RING VAGINALLY EVERY 3 MONTHS  . furosemide (LASIX)  40 MG tablet TAKE 1 TABLET BY MOUTH THREE TIMES DAILY FOR FLUID RETENTION (Patient taking differently: TAKE 1 TABLET BY MOUTH daily)  . levothyroxine (SYNTHROID, LEVOTHROID) 150 MCG tablet TAKE 1 TABLET BY MOUTH EVERY DAY  . LOTEMAX 0.5 % GEL Place 1 drop into both eyes 3 (three) times daily.   . Magnesium Oxide 500 MG TABS Take 500 mg by mouth daily.  . NONFORMULARY OR COMPOUNDED ITEM Take 900 mg by mouth daily.  . Omega-3 Fatty Acids (FISH OIL) 1200 MG CAPS Take 1 capsule by mouth daily.  Vladimir Faster Glycol-Propyl Glycol (SYSTANE) 0.4-0.3 % SOLN Apply 1 drop to eye 4 (four) times daily.  Marland Kitchen PREVIDENT 5000 SENSITIVE 1.1-5 % PSTE   . RESTASIS 0.05 % ophthalmic emulsion   . Sod Fluoride-Potassium Nitrate  (PREVIDENT 5000 SENSITIVE) 1.1-5 % PSTE    No current facility-administered medications on file prior to visit.     Problem list She has Intrinsic asthma; Hypertension; Hypothyroidism; Mixed hyperlipidemia; Prediabetes; Vitamin D deficiency; Medication management; Arthritis, degenerative; Axillary hidradenitis suppurativa; Sjogren's disease ([place]); Medicare annual wellness visit, subsequent; Rheumatoid arthritis ([place]); Degeneration of intervertebral disc of lumbosacral region; S/P total knee arthroplasty; Chronic pain of left ankle; and Pain in left elbow on her problem list.   Preventative care  Immunization History  Administered Date(s) Administered  . DTaP 09/15/2012, 09/15/2012  . Influenza Whole 09/06/2012  . Influenza-Unspecified 05/07/2014, 05/20/2016  . Pneumococcal Conjugate-13 09/15/2012, 07/05/2014  . Zoster 09/03/2013   Colonoscopy 2013 MGM 2017 going may 4th Stress 2004 DEXA 2013 Echo 2004 CXr 02/2016 PAP remote, due  Names of Other Physician/Practitioners you currently use: 1. [facility] here for primary care 2. Dr Sherral Hammers, eye doctor, last visit Jan  2018 3. Dr Raiford Noble, dentist, every 6 months Patient Care Team: Unk Pinto, MD as PCP - General (Internal Medicine) Ladene Artist, MD as Consulting Physician (Gastroenterology) Newt Minion, MD as Consulting Physician (Orthopedic Surgery) Unice Bailey, MD as Consulting Physician (Rheumatology) Karie Chimera, MD as Consulting Physician (Neurosurgery) Druscilla Brownie, MD as Consulting Physician (Dermatology) Druscilla Brownie, MD as Consulting Physician (Dermatology)  Allergies Allergies  Allergen Reactions  . Hydrocodone Itching  . Morphine And Related Itching  . Sulfa Antibiotics Itching    Other reaction(s): Other (See Comments)  . Compazine [Prochlorperazine Edisylate]   . Levaquin [Levofloxacin] Hives  . Lyrica [Pregabalin] Other (See Comments)    Altered  mental status Other reaction(s): Other (See Comments) hallucinations hallucinations  . Ciprofloxacin Hives and Swelling  . Penicillins Itching    SURGICAL HISTORY She  has a past surgical history that includes Thyroidectomy; Upper gastrointestinal endoscopy; Total knee arthroplasty; Vaginal hysterectomy; Knee surgery; Pilonidal cyst excision; Tonsillectomy; Anterior lat lumbar fusion (Left, 01/26/2013); Lumbar percutaneous pedicle screw 1 level (Left, 01/26/2013); and Knee Arthroplasty (Left, 02/16/2016). FAMILY HISTORY Her family history includes Asthma in her brother; Cancer in her maternal grandmother; Diabetes in her brother; Hypertension in her father; Other in her daughter; Stroke in her father. SOCIAL HISTORY She  reports that she quit smoking about 17 months ago. Her smoking use included Cigarettes. She has a 20.00 pack-year smoking history. She has never used smokeless tobacco. She reports that she drinks alcohol. She reports that she does not use drugs.  MEDICARE WELLNESS OBJECTIVES: Physical activity: Current Exercise Habits: Home exercise routine, Intensity: Mild, Exercise limited by: None identified Cardiac risk factors: Cardiac Risk Factors include: dyslipidemia;hypertension;obesity (BMI >30kg/m2);sedentary lifestyle Depression/mood screen:   Depression screen [facility] 2/9 12/24/2016  Decreased Interest 0  Down, Depressed, Hopeless 0  PHQ - 2 Score 0    ADLs:  In your present state of health, do you have any difficulty performing the following activities: 12/24/2016 07/13/2016  Hearing? N N  Vision? N N  Difficulty concentrating or making decisions? N N  Walking or climbing stairs? N N  Dressing or bathing? N N  Doing errands, shopping? N N  Some recent data might be hidden    Cognitive Testing  Alert? Yes  Normal Appearance?Yes  Oriented to person? Yes  Place? Yes   Time? Yes  Recall of three objects?  Yes  Can perform simple calculations? Yes  Displays appropriate  judgment?Yes  Can read the correct time from a watch face?Yes  EOL planning: Does Patient Have a Medical Advance Directive?: No Would patient like information on creating a medical advance directive?: Yes (MAU/Ambulatory/Procedural Areas - Information given)    Review of Systems  Constitutional: Negative for chills, diaphoresis, fatigue and fever.  HENT: Positive for congestion, postnasal drip, sinus pressure and sneezing. Negative for ear pain and sore throat.   Respiratory: Positive for cough and shortness of breath. Negative for chest tightness and wheezing.   Cardiovascular: Positive for chest pain. Negative for palpitations and leg swelling.  Gastrointestinal: Negative.   Genitourinary: Negative.   Musculoskeletal: Negative for neck pain.  Neurological: Positive for headaches.       Objective:   Physical Exam  Constitutional: She appears well-developed and well-nourished.  HENT:  Head: Normocephalic and atraumatic.  Right Ear: External ear normal.  Nose: Right sinus exhibits maxillary sinus tenderness. Right sinus exhibits no frontal sinus tenderness. Left sinus exhibits maxillary sinus tenderness. Left sinus exhibits no frontal sinus tenderness.  Eyes: Conjunctivae and EOM are normal.  Neck: Normal range of motion. Neck supple.  Cardiovascular: Normal rate, regular rhythm, normal heart sounds and intact distal pulses.   Pulmonary/Chest: Effort normal and breath sounds normal. No respiratory distress. She has no wheezes.  Abdominal: Soft. Bowel sounds are normal.  Lymphadenopathy:    She has cervical adenopathy.  Skin: Skin is warm and dry.       Assessment & Plan:  Acute recurrent maxillary sinusitis - azithromycin (ZITHROMAX) 250 MG tablet; Take 2 tablets (500 mg) on  Day 1,  followed by 1 tablet (250 mg) once daily on Days 2 through 5.  Dispense: 6 each; Refill: 1 - predniSONE (DELTASONE) 20 MG tablet; 2 tablets daily for 3 days, 1 tablet daily for 4 days.  Dispense:  10 tablet; Refill: 0 - promethazine-dextromethorphan (PROMETHAZINE-DM) 6.25-15 MG/5ML syrup; Take 5 mLs by mouth 4 (four) times daily as needed for cough.  Dispense: 240 mL; Refill: 1   Essential hypertension - continue medications, DASH diet, exercise and monitor at home. Call if greater than 130/80.  - Microalbumin / creatinine urine ratio   Mixed hyperlipidemia - Lipid panel   Prediabetes - Hemoglobin A1c - Insulin, fasting  Vitamin D deficiency - Vit D  25 hydroxy (rtn osteoporosis monitoring)  Hypothyroidism, unspecified hypothyroidism type Hypothyroidism-check TSH level, continue medications the same, reminded to take on an empty stomach 30-56mins before food.   Sjogren's disease On DMARD  Rheumatoid arthritis Continue follow up, on DMARD   Intrinsic asthma, unspecified asthma severity, uncomplicated   Encounter for long-term (current) use of medications  Medicare annual wellness visit, subsequent   Degeneration of intervertebral disc of lumbosacral region RICE, NSAIDS, exercises given, if not better get xray and PT referral or ortho referral.  Axillary hidradenitis suppurativa Continue PRN   Osteoarthritis, unspecified osteoarthritis type, unspecified site Continue follow up for RA  During the course of the visit the patient was educated and counseled about appropriate screening and preventive services including:    Pneumococcal vaccine   Influenza vaccine  Td vaccine  Screening electrocardiogram  Bone densitometry screening  Colorectal cancer screening  Diabetes screening  Glaucoma screening  Nutrition counseling   Advanced directives: requested  Medicare Attestation I have personally reviewed: The patient's medical and social history Their use of alcohol, tobacco or illicit drugs Their current medications and supplements The patient's functional ability including ADLs,fall risks, home safety risks, cognitive, and hearing and  visual impairment Diet and physical activities Evidence for depression or mood disorders  The patient's weight, height, BMI, and visual acuity have been recorded in the chart.  I have made referrals, counseling, and provided education to the patient based on review of the above and I have provided the patient with a written personalized care plan for preventive services.    Vicie Mutters, PA-C                         12/24/2016

## 2016-12-27 ENCOUNTER — Other Ambulatory Visit: Payer: Self-pay | Admitting: Internal Medicine

## 2016-12-28 DIAGNOSIS — H524 Presbyopia: Secondary | ICD-10-CM | POA: Diagnosis not present

## 2017-01-05 DIAGNOSIS — M797 Fibromyalgia: Secondary | ICD-10-CM | POA: Diagnosis not present

## 2017-01-05 DIAGNOSIS — M0609 Rheumatoid arthritis without rheumatoid factor, multiple sites: Secondary | ICD-10-CM | POA: Diagnosis not present

## 2017-01-05 DIAGNOSIS — M25539 Pain in unspecified wrist: Secondary | ICD-10-CM | POA: Diagnosis not present

## 2017-01-05 DIAGNOSIS — Z79899 Other long term (current) drug therapy: Secondary | ICD-10-CM | POA: Diagnosis not present

## 2017-01-07 ENCOUNTER — Ambulatory Visit
Admission: RE | Admit: 2017-01-07 | Discharge: 2017-01-07 | Disposition: A | Discharge status: Home / Self Care | Payer: Medicare Other | Source: Ambulatory Visit | Attending: Internal Medicine | Admitting: Internal Medicine

## 2017-01-07 DIAGNOSIS — Z1231 Encounter for screening mammogram for malignant neoplasm of breast: Secondary | ICD-10-CM | POA: Diagnosis not present

## 2017-01-28 ENCOUNTER — Ambulatory Visit (INDEPENDENT_AMBULATORY_CARE_PROVIDER_SITE_OTHER): Payer: Medicare Other | Admitting: Internal Medicine

## 2017-01-28 VITALS — BP 118/82 | HR 64 | Temp 97.3°F | Resp 16 | Ht 63.5 in | Wt 200.0 lb

## 2017-01-28 DIAGNOSIS — M069 Rheumatoid arthritis, unspecified: Secondary | ICD-10-CM

## 2017-01-28 DIAGNOSIS — E782 Mixed hyperlipidemia: Secondary | ICD-10-CM

## 2017-01-28 DIAGNOSIS — Z136 Encounter for screening for cardiovascular disorders: Secondary | ICD-10-CM

## 2017-01-28 DIAGNOSIS — Z79899 Other long term (current) drug therapy: Secondary | ICD-10-CM

## 2017-01-28 DIAGNOSIS — Z0000 Encounter for general adult medical examination without abnormal findings: Secondary | ICD-10-CM

## 2017-01-28 DIAGNOSIS — E039 Hypothyroidism, unspecified: Secondary | ICD-10-CM | POA: Diagnosis not present

## 2017-01-28 DIAGNOSIS — R7303 Prediabetes: Secondary | ICD-10-CM | POA: Diagnosis not present

## 2017-01-28 DIAGNOSIS — Z0001 Encounter for general adult medical examination with abnormal findings: Secondary | ICD-10-CM

## 2017-01-28 DIAGNOSIS — E559 Vitamin D deficiency, unspecified: Secondary | ICD-10-CM

## 2017-01-28 DIAGNOSIS — I10 Essential (primary) hypertension: Secondary | ICD-10-CM | POA: Diagnosis not present

## 2017-01-28 DIAGNOSIS — Z1212 Encounter for screening for malignant neoplasm of rectum: Secondary | ICD-10-CM

## 2017-01-28 LAB — CBC WITH DIFFERENTIAL/PLATELET
Basophils Absolute: 0 cells/uL (ref 0–200)
Basophils Relative: 0 %
EOS PCT: 1 %
Eosinophils Absolute: 65 cells/uL (ref 15–500)
HEMATOCRIT: 41.1 % (ref 35.0–45.0)
Hemoglobin: 13.2 g/dL (ref 11.7–15.5)
LYMPHS PCT: 51 %
Lymphs Abs: 3315 cells/uL (ref 850–3900)
MCH: 27.8 pg (ref 27.0–33.0)
MCHC: 32.1 g/dL (ref 32.0–36.0)
MCV: 86.5 fL (ref 80.0–100.0)
MONOS PCT: 6 %
MPV: 9.4 fL (ref 7.5–12.5)
Monocytes Absolute: 390 cells/uL (ref 200–950)
NEUTROS PCT: 42 %
Neutro Abs: 2730 cells/uL (ref 1500–7800)
PLATELETS: 325 10*3/uL (ref 140–400)
RBC: 4.75 MIL/uL (ref 3.80–5.10)
RDW: 15.1 % — AB (ref 11.0–15.0)
WBC: 6.5 10*3/uL (ref 3.8–10.8)

## 2017-01-28 LAB — TSH: TSH: 0.21 mIU/L — ABNORMAL LOW

## 2017-01-28 NOTE — Patient Instructions (Signed)

## 2017-01-28 NOTE — Progress Notes (Signed)
[facility] Unk Pinto, M.D.      Uvaldo Bristle. Silverio Lay, P.A.-C [facility]                [address] [place], [place] [address] Telephone [phone] Telefax [phone]  Annual Screening/Preventative Visit & Comprehensive Evaluation &  Examination     This very nice 56 y.o. MWF presents for a Screening/Preventative Visit & comprehensive evaluation and management of multiple medical co-morbidities.  Patient has been followed for HTN, Prediabetes, Hyperlipidemia and Vitamin D Deficiency.Patient uis followed by Dr Dossie Der for Rheumatoid Arthritis 1st dx'd in 2009.      HTN predates since 2000. Patient's BP has been controlled at home and patient denies any cardiac symptoms as chest pain, palpitations, shortness of breath, dizziness or ankle swelling. Today's BP is at goal - 118/82.      Patient's hyperlipidemia is not controlled with diet and medications. Patient denies myalgias or other medication SE's. Last lipids were not at goal:  Lab Results  Component Value Date   CHOL 241 (H) 10/14/2016   HDL 63 10/14/2016   LDLCALC 154 (H) 10/14/2016   TRIG 118 10/14/2016   CHOLHDL 3.8 10/14/2016      Patient has Morbid Obesity  (BMI 34+) and prediabetes (A1c 5.9% in 2010) and patient denies reactive hypoglycemic symptoms, visual blurring, diabetic polys, or paresthesias. Last A1c was at goal: Lab Results  Component Value Date   HGBA1C 5.4 07/13/2016      Finally, patient has history of Vitamin D Deficiency ("27" in 2009) and last Vitamin D was near goal (goal 70-100): Lab Results  Component Value Date   VD25OH 54 07/13/2016   Current Outpatient Prescriptions on File Prior to Visit  Medication Sig  . Albuterol HFA  inhaler 1-2 puffs  every 6  hrs as needed for wheezing  . albuterol  (2.5 MG/3ML) neb soln Take 3 mLs  by neb every 6 hours as needed   . amitriptyline (ELAVIL) 25 MG tablet Take 50 mg 1 tab  a.m.  & 3 tab  qhs  . atorvastatin (LIPITOR) 80 MG tablet TAKE 1 TAB DAILY  - Takes 1/2 tab 3 x/week  . AVENOVA 0.01 % SOLN Apply to eyelids BID  . BUTRANS 10 MCG/HR patch Place 10 mcg onto the skin once a week.  . Calcium Citrate-Vitamin D (CITRACAL + D PO) Take 1 tablet by mouth daily. 1200 mg ER  . Cholecalciferol (VITAMIN D3) 5000 UNITS TABS Take 5,000 mg by mouth daily.  . clindamycin (CLEOCIN) 150 MG capsule 4 Caps 1 HR BEFORE DENTAL APPOINTMENT  . CRANBERRY CONCENTRATE PO Take 1 capsule by mouth daily.  . cyclobenzaprine (FLEXERIL) 10 MG tablet TAKE 1 TAB AT BEDTIME  . RESTASIS ophth    . Eyelid Cleansers (AVENOVA) 0.01 % SOLN   . FEMRING 0.1 MG/24HR RING INSERT 1 RING VAGINALLY EVERY 3 MONTHS  . furosemide (LASIX) 40 MG tablet TAKE 1 TAB 1 x / day    . levothyroxine  150 MCG tablet TAKE 1 TABLET BY MOUTH EVERY DAY  . LOTEMAX 0.5 % GEL Place 1 drop into both eyes 3 (three) times daily.   . Magnesium Oxide 500 MG TABS Take 500 mg by mouth daily.  . Tumeric  900 mg Take  daily.  . Omega-3 FISH OIL 1200 MG  Take 1 cap daily.  Carren Rang  SOLN Apply 1 drop to eye 4 (four) times daily.   Allergies  Allergen Reactions  . Hydrocodone Itching  . Morphine And Related Itching  . Sulfa Antibiotics Itching  . Compazine   . Levaquin  Hives  . Lyrica  Altered mental status - hallucinations  . Ciprofloxacin Hives and Swelling  . Penicillins Itching   Past Medical History:  Diagnosis Date  . Arthritis    OA, RA  . Asthma   . Autoimmune deficiency syndrome ([place])   . Back pain   . Cancer ([place])    thyroid-1995  . GERD (gastroesophageal reflux disease)   . Hiatal hernia   . Hyperlipidemia   . Hypertension   . Hypothyroidism   . Pneumonia    Health Maintenance  Topic Date Due  . PAP SMEAR  04/03/1982  . INFLUENZA VACCINE  04/06/2017  . MAMMOGRAM  01/08/2019  . COLONOSCOPY  10/12/2021  . TETANUS/TDAP  09/15/2022  . Hepatitis C Screening  Completed  . HIV Screening  Completed    Immunization History  Administered Date(s) Administered  . DTaP 09/15/2012, 09/15/2012  . Influenza Whole 09/06/2012  . Influenza-Unspecified 05/07/2014, 05/20/2016  . Pneumococcal Conjugate-13 09/15/2012, 07/05/2014  . Zoster 09/03/2013   Past Surgical History:  Procedure Laterality Date  . ANTERIOR LAT LUMBAR FUSION Left 01/26/2013   Procedure: ANTERIOR LATERAL LUMBAR FUSION 1 LEVEL;  Surgeon: Faythe Ghee, MD;  Location: [facility];  Service: Neurosurgery;  Laterality: Left;  lumbar four-five  . KNEE ARTHROPLASTY Left 02/16/2016   Procedure: COMPUTER ASSISTED TOTAL KNEE ARTHROPLASTY;  Surgeon: Dereck Leep, MD;  Location: [facility];  Service: Orthopedics;  Laterality: Left;  . KNEE SURGERY     3 on right knee, 1 on left-prior to TKR  . LUMBAR PERCUTANEOUS PEDICLE SCREW 1 LEVEL Left 01/26/2013   Procedure: LUMBAR PERCUTANEOUS PEDICLE SCREW 1 LEVEL;  Surgeon: Faythe Ghee, MD;  Location: [facility];  Service: Neurosurgery;  Laterality: Left;  lumbar four-five  . PILONIDAL CYST EXCISION    . THYROIDECTOMY    . TONSILLECTOMY    . TOTAL KNEE ARTHROPLASTY     right  . UPPER GASTROINTESTINAL ENDOSCOPY    . VAGINAL HYSTERECTOMY     Family History  Problem Relation Age of Onset  . Hypertension Father   . Stroke Father   . Diabetes Brother   . Asthma Brother   . Other Daughter        XED CONNECTIVE TISSUE  . Cancer Maternal Grandmother        BREAST  . Breast cancer Maternal Grandmother 61  . Colon cancer Neg Hx   . Esophageal cancer Neg Hx   . Stomach cancer Neg Hx   . Rectal cancer Neg Hx    Social History  Substance Use Topics  . Smoking status: Former Smoker    Packs/day: 0.50    Years: 40.00    Types: Cigarettes    Quit date: 07/05/2015  . Smokeless tobacco: Never Used  . Alcohol use 0.0 oz/week     Comment: rare    ROS Constitutional: Denies fever, chills, weight loss/gain, headaches, insomnia,  night sweats, and change in appetite. Does c/o  fatigue. Eyes: Denies redness, blurred vision, diplopia, discharge, itchy, watery eyes.  ENT: Denies discharge, congestion, post nasal drip, epistaxis, sore throat, earache, hearing loss, dental pain, Tinnitus, Vertigo, Sinus pain, snoring.  Cardio: Denies chest pain, palpitations, irregular heartbeat, syncope, dyspnea, diaphoresis, orthopnea, PND, claudication, edema Respiratory: denies cough, dyspnea, DOE,  pleurisy, hoarseness, laryngitis, wheezing.  Gastrointestinal: Denies dysphagia, heartburn, reflux, water brash, pain, cramps, nausea, vomiting, bloating, diarrhea, constipation, hematemesis, melena, hematochezia, jaundice, hemorrhoids Genitourinary: Denies dysuria, frequency, urgency, nocturia, hesitancy, discharge, hematuria, flank pain Breast: Breast lumps, nipple discharge, bleeding.  Musculoskeletal: Denies arthralgia, myalgia, stiffness, Jt. Swelling, pain, limp, and strain/sprain. Denies falls. Skin: Denies puritis, rash, hives, warts, acne, eczema, changing in skin lesion Neuro: No weakness, tremor, incoordination, spasms, paresthesia, pain Psychiatric: Denies confusion, memory loss, sensory loss. Denies Depression. Endocrine: Denies change in weight, skin, hair change, nocturia, and paresthesia, diabetic polys, visual blurring, hyper / hypo glycemic episodes.  Heme/Lymph: No excessive bleeding, bruising, enlarged lymph nodes.  Physical Exam  BP 118/82   Pulse 64   Temp 97.3 F (36.3 C)   Resp 16   Ht 5' 3.5" (1.613 m)   Wt 200 lb (90.7 kg)   LMP  (LMP Unknown)   BMI 34.87 kg/m   General Appearance: Well nourished, well groomed and in no apparent distress.  Eyes: PERRLA, EOMs, conjunctiva no swelling or erythema, normal fundi and vessels. Sinuses: No frontal/maxillary tenderness ENT/Mouth: EACs patent / TMs  nl. Nares clear without erythema, swelling, mucoid exudates. Oral hygiene is good. No erythema, swelling, or exudate. Tongue normal, non-obstructing. Tonsils not  swollen or erythematous. Hearing normal.  Neck: Supple, thyroid normal. No bruits, nodes or JVD. Respiratory: Respiratory effort normal.  BS equal and clear bilateral without rales, rhonci, wheezing or stridor. Cardio: Heart sounds are normal with regular rate and rhythm and no murmurs, rubs or gallops. Peripheral pulses are normal and equal bilaterally without edema. No aortic or femoral bruits. Chest: symmetric with normal excursions and percussion. Breasts: Symmetric, without lumps, nipple discharge, retractions, or fibrocystic changes.  Abdomen: Flat, soft with bowel sounds active. Nontender, no guarding, rebound, hernias, masses, or organomegaly.  Lymphatics: Non tender without lymphadenopathy.  Genitourinary:  Musculoskeletal: Full ROM all peripheral extremities, joint stability, 5/5 strength, and normal gait. Skin: Warm and dry without rashes, lesions, cyanosis, clubbing or  ecchymosis.  Neuro: Cranial nerves intact, reflexes equal bilaterally. Normal muscle tone, no cerebellar symptoms. Sensation intact.  Pysch: Alert and oriented X 3, normal affect, Insight and Judgment appropriate.   Assessment and Plan  1. Annual Preventative Screening Examination  2. Essential hypertension  - EKG 12-Lead - Urinalysis, Routine w reflex microscopic - Microalbumin / creatinine urine ratio - CBC with Differential/Platelet - BASIC METABOLIC PANEL WITH GFR - Magnesium - TSH  3. Hyperlipidemia, mixed  - EKG 12-Lead - Hepatic function panel - Lipid panel - TSH  4. Prediabetes  - EKG 12-Lead - Hemoglobin A1c - Insulin, random  5. Vitamin D deficiency  - VITAMIN D 25 Hydroxy  6. Rheumatoid arthritis involving multiple sites(HCC)   7. Hypothyroidism, unspecified type  - TSH  8. Screening for rectal cancer  - POC Hemoccult Bld/Stl   9. Screening for ischemic heart disease  - EKG 12-Lead  10. Medication management  - Urinalysis, Routine w reflex microscopic - Microalbumin  / creatinine urine ratio - CBC with Differential/Platelet - BASIC METABOLIC PANEL WITH GFR - Hepatic function panel - Magnesium - Lipid panel - TSH - Hemoglobin A1c - Insulin, random - VITAMIN D 25 Hydroxy       Patient was counseled in prudent diet to achieve/maintain BMI less than 25 for weight control, BP monitoring, regular exercise and medications. Discussed med's effects and SE's. Screening labs and tests as requested with regular follow-up as recommended. Over 40 minutes of exam, counseling, chart  review and high complex critical decision making was performed.

## 2017-01-29 ENCOUNTER — Encounter: Payer: Self-pay | Admitting: Internal Medicine

## 2017-01-29 ENCOUNTER — Other Ambulatory Visit: Payer: Self-pay | Admitting: Internal Medicine

## 2017-01-29 DIAGNOSIS — E039 Hypothyroidism, unspecified: Secondary | ICD-10-CM

## 2017-01-29 LAB — LIPID PANEL
CHOLESTEROL: 304 mg/dL — AB (ref <200)
HDL: 79 mg/dL (ref >50)
LDL Cholesterol: 206 mg/dL — ABNORMAL HIGH (ref <100)
TRIGLYCERIDES: 96 mg/dL (ref <150)
Total CHOL/HDL Ratio: 3.8 Ratio (ref <5.0)
VLDL: 19 mg/dL (ref <30)

## 2017-01-29 LAB — HEPATIC FUNCTION PANEL
ALT: 34 U/L — ABNORMAL HIGH (ref 6–29)
AST: 24 U/L (ref 10–35)
Albumin: 4 g/dL (ref 3.6–5.1)
Alkaline Phosphatase: 60 U/L (ref 33–130)
BILIRUBIN INDIRECT: 0.3 mg/dL (ref 0.2–1.2)
BILIRUBIN TOTAL: 0.4 mg/dL (ref 0.2–1.2)
Bilirubin, Direct: 0.1 mg/dL (ref <=0.2)
TOTAL PROTEIN: 6.8 g/dL (ref 6.1–8.1)

## 2017-01-29 LAB — URINALYSIS, ROUTINE W REFLEX MICROSCOPIC
Bilirubin Urine: NEGATIVE
Glucose, UA: NEGATIVE
Ketones, ur: NEGATIVE
LEUKOCYTES UA: NEGATIVE
Nitrite: NEGATIVE
PROTEIN: NEGATIVE
SPECIFIC GRAVITY, URINE: 1.017 (ref 1.001–1.035)
pH: 5 (ref 5.0–8.0)

## 2017-01-29 LAB — BASIC METABOLIC PANEL WITH GFR
BUN: 13 mg/dL (ref 7–25)
CALCIUM: 9.4 mg/dL (ref 8.6–10.4)
CO2: 24 mmol/L (ref 20–31)
Chloride: 102 mmol/L (ref 98–110)
Creat: 0.61 mg/dL (ref 0.50–1.05)
GLUCOSE: 91 mg/dL (ref 65–99)
Potassium: 4.5 mmol/L (ref 3.5–5.3)
SODIUM: 137 mmol/L (ref 135–146)

## 2017-01-29 LAB — URINALYSIS, MICROSCOPIC ONLY
Bacteria, UA: NONE SEEN [HPF]
CASTS: NONE SEEN [LPF]
Crystals: NONE SEEN [HPF]
RBC / HPF: NONE SEEN RBC/HPF (ref <=2)
SQUAMOUS EPITHELIAL / LPF: NONE SEEN [HPF] (ref <=5)
WBC, UA: NONE SEEN WBC/HPF (ref <=5)
YEAST: NONE SEEN [HPF]

## 2017-01-29 LAB — VITAMIN D 25 HYDROXY (VIT D DEFICIENCY, FRACTURES): Vit D, 25-Hydroxy: 51 ng/mL (ref 30–100)

## 2017-01-29 LAB — MAGNESIUM: Magnesium: 1.8 mg/dL (ref 1.5–2.5)

## 2017-01-29 LAB — MICROALBUMIN / CREATININE URINE RATIO
CREATININE, URINE: 72 mg/dL (ref 20–320)
MICROALB UR: 7 mg/dL
MICROALB/CREAT RATIO: 97 ug/mg{creat} — AB (ref <30)

## 2017-01-29 LAB — HEMOGLOBIN A1C
Hgb A1c MFr Bld: 5.5 % (ref <5.7)
MEAN PLASMA GLUCOSE: 111 mg/dL

## 2017-01-29 MED ORDER — ADALIMUMAB 40 MG/0.4ML ~~LOC~~ AJKT: 40.0000 mg | AUTO-INJECTOR | SUBCUTANEOUS | Status: DC

## 2017-02-01 LAB — INSULIN, RANDOM: INSULIN: 5.5 u[IU]/mL (ref 2.0–19.6)

## 2017-02-08 ENCOUNTER — Encounter (HOSPITAL_COMMUNITY): Payer: Self-pay | Admitting: *Deleted

## 2017-02-08 ENCOUNTER — Emergency Department (HOSPITAL_COMMUNITY)
Admission: EM | Admit: 2017-02-08 | Discharge: 2017-02-08 | Disposition: A | Discharge status: Home / Self Care | Payer: Medicare Other | Attending: Emergency Medicine | Admitting: Emergency Medicine

## 2017-02-08 DIAGNOSIS — I10 Essential (primary) hypertension: Secondary | ICD-10-CM | POA: Insufficient documentation

## 2017-02-08 DIAGNOSIS — E039 Hypothyroidism, unspecified: Secondary | ICD-10-CM | POA: Diagnosis not present

## 2017-02-08 DIAGNOSIS — J45909 Unspecified asthma, uncomplicated: Secondary | ICD-10-CM | POA: Insufficient documentation

## 2017-02-08 DIAGNOSIS — L02214 Cutaneous abscess of groin: Secondary | ICD-10-CM | POA: Diagnosis not present

## 2017-02-08 DIAGNOSIS — Z87891 Personal history of nicotine dependence: Secondary | ICD-10-CM | POA: Diagnosis not present

## 2017-02-08 DIAGNOSIS — Z79899 Other long term (current) drug therapy: Secondary | ICD-10-CM | POA: Insufficient documentation

## 2017-02-08 DIAGNOSIS — Z85850 Personal history of malignant neoplasm of thyroid: Secondary | ICD-10-CM | POA: Diagnosis not present

## 2017-02-08 DIAGNOSIS — L0291 Cutaneous abscess, unspecified: Secondary | ICD-10-CM

## 2017-02-08 LAB — CBC WITH DIFFERENTIAL/PLATELET
Basophils Absolute: 0 10*3/uL (ref 0.0–0.1)
Basophils Relative: 0 %
Eosinophils Absolute: 0 10*3/uL (ref 0.0–0.7)
Eosinophils Relative: 0 %
HCT: 41 % (ref 36.0–46.0)
Hemoglobin: 13.6 g/dL (ref 12.0–15.0)
Lymphocytes Relative: 27 %
Lymphs Abs: 3.2 10*3/uL (ref 0.7–4.0)
MCH: 28.7 pg (ref 26.0–34.0)
MCHC: 33.2 g/dL (ref 30.0–36.0)
MCV: 86.5 fL (ref 78.0–100.0)
Monocytes Absolute: 0.9 10*3/uL (ref 0.1–1.0)
Monocytes Relative: 7 %
Neutro Abs: 7.7 10*3/uL (ref 1.7–7.7)
Neutrophils Relative %: 66 %
Platelets: 289 10*3/uL (ref 150–400)
RBC: 4.74 MIL/uL (ref 3.87–5.11)
RDW: 15 % (ref 11.5–15.5)
WBC: 11.8 10*3/uL — ABNORMAL HIGH (ref 4.0–10.5)

## 2017-02-08 LAB — I-STAT CG4 LACTIC ACID, ED: Lactic Acid, Venous: 0.76 mmol/L (ref 0.5–1.9)

## 2017-02-08 LAB — BASIC METABOLIC PANEL
ANION GAP: 8 (ref 5–15)
BUN: 6 mg/dL (ref 6–20)
CALCIUM: 8.8 mg/dL — AB (ref 8.9–10.3)
CHLORIDE: 104 mmol/L (ref 101–111)
CO2: 25 mmol/L (ref 22–32)
Creatinine, Ser: 0.67 mg/dL (ref 0.44–1.00)
GFR calc non Af Amer: >60 mL/min (ref >60)
GLUCOSE: 84 mg/dL (ref 65–99)
POTASSIUM: 3.8 mmol/L (ref 3.5–5.1)
Sodium: 137 mmol/L (ref 135–145)

## 2017-02-08 MED ORDER — LIDOCAINE HCL 2 % IJ SOLN
20.0000 mL | Freq: Once | INTRAMUSCULAR | Status: AC
  Administered 2017-02-08: 400 mg
  Filled 2017-02-08: qty 20

## 2017-02-08 MED ORDER — CLINDAMYCIN HCL 300 MG PO CAPS: 300.0000 mg | ORAL_CAPSULE | Freq: Four times a day (QID) | ORAL | 0 refills | Status: DC

## 2017-02-08 MED ORDER — SODIUM CHLORIDE 0.9 % IV BOLUS (SEPSIS)
1000.0000 mL | Freq: Once | INTRAVENOUS | Status: AC
  Administered 2017-02-08: 1000 mL via INTRAVENOUS

## 2017-02-08 MED ORDER — CLINDAMYCIN HCL 150 MG PO CAPS
300.0000 mg | ORAL_CAPSULE | Freq: Once | ORAL | Status: AC
  Administered 2017-02-08: 300 mg via ORAL
  Filled 2017-02-08: qty 2

## 2017-02-08 MED ORDER — IBUPROFEN 400 MG PO TABS
600.0000 mg | ORAL_TABLET | Freq: Once | ORAL | Status: AC
  Administered 2017-02-08: 600 mg via ORAL
  Filled 2017-02-08: qty 1

## 2017-02-08 NOTE — ED Triage Notes (Signed)
To ED for eval and treatment of abscess in right groin area. States she has tried to use epison salt baths without relief. Area is red and inflammed.

## 2017-02-08 NOTE — Discharge Instructions (Signed)
Please read attached information. If you experience any new or worsening signs or symptoms please return to the emergency room for evaluation. Please follow-up with your primary care provider or specialist as discussed. Please use medication prescribed only as directed and discontinue taking if you have any concerning signs or symptoms.   °

## 2017-02-08 NOTE — ED Provider Notes (Signed)
[facility] DEPT Provider Note   CSN: [id number] Arrival date & time: 02/08/17  1121   By signing my name below, I, Soijett Blue, attest that this documentation has been prepared under the direction and in the presence of Lenn Sink, PA-C Electronically Signed: Soijett Blue, ED Scribe. 02/08/17. 12:27 PM.  History   Chief Complaint Chief Complaint  Patient presents with  . Abscess    HPI Autumn Frye is a 56 y.o. female with a PMHx of HTN, who presents to the Emergency Department complaining of abscess to right inguinal area onset 2-3 days ago. Pt reports associated redness to the affected area. Pt has tried epsom soaks with no relief of her symptoms. She notes that she has a hx of MRSA and takes humira daily. She denies fever, chills, she notes some fatigue, minor drainage noted. Denies hx of DM.    The history is provided by the patient. No language interpreter was used.    Past Medical History:  Diagnosis Date  . Arthritis    OA, RA  . Asthma   . Autoimmune deficiency syndrome ([place])   . Back pain   . Cancer ([place])    thyroid-1995  . GERD (gastroesophageal reflux disease)   . Hiatal hernia   . Hyperlipidemia   . Hypertension   . Hypothyroidism   . Pneumonia     Patient Active Problem List   Diagnosis Date Noted  . Rheumatoid arthritis ([place]) 11/14/2015  . Medicare annual wellness visit, subsequent 04/24/2015  . Axillary hidradenitis suppurativa 07/05/2014  . Sjogren's disease ([place]) 07/05/2014  . Medication management 03/26/2014  . Mixed hyperlipidemia 09/24/2013  . Prediabetes 09/24/2013  . Vitamin D deficiency 09/24/2013  . Intrinsic asthma   . Hypertension   . Hypothyroidism   . Degeneration of intervertebral disc of lumbosacral region 05/23/2008    Past Surgical History:  Procedure Laterality Date  . ANTERIOR LAT LUMBAR FUSION Left 01/26/2013   Procedure: ANTERIOR LATERAL LUMBAR FUSION 1 LEVEL;  Surgeon: Faythe Ghee, MD;  Location: [facility];   Service: Neurosurgery;  Laterality: Left;  lumbar four-five  . KNEE ARTHROPLASTY Left 02/16/2016   Procedure: COMPUTER ASSISTED TOTAL KNEE ARTHROPLASTY;  Surgeon: Dereck Leep, MD;  Location: [facility] ORS;  Service: Orthopedics;  Laterality: Left;  . KNEE SURGERY     3 on right knee, 1 on left-prior to TKR  . LUMBAR PERCUTANEOUS PEDICLE SCREW 1 LEVEL Left 01/26/2013   Procedure: LUMBAR PERCUTANEOUS PEDICLE SCREW 1 LEVEL;  Surgeon: Faythe Ghee, MD;  Location: [facility];  Service: Neurosurgery;  Laterality: Left;  lumbar four-five  . PILONIDAL CYST EXCISION    . THYROIDECTOMY    . TONSILLECTOMY    . TOTAL KNEE ARTHROPLASTY     right  . UPPER GASTROINTESTINAL ENDOSCOPY    . VAGINAL HYSTERECTOMY      OB History    No data available       Home Medications    Prior to Admission medications   Medication Sig Start Date End Date Taking? Authorizing Provider  Adalimumab (HUMIRA PEN) 40 MG/0.4ML PNKT Inject 40 mg into the skin every 30 (thirty) days. 01/29/17   Unk Pinto, MD  albuterol [facility] HFA) [phone] Base) MCG/ACT inhaler Inhale 1-2 puffs into the lungs every 6 (six) hours as needed for wheezing or shortness of breath. Reported on 03/12/2016 03/12/16   Forcucci, Loma Sousa, PA-C  albuterol (PROVENTIL) (2.5 MG/3ML) 0.083% nebulizer solution Take 3 mLs (2.5 mg total) by nebulization every  6 (six) hours as needed for wheezing or shortness of breath. 09/12/13   Vicie Mutters, PA-C  amitriptyline (ELAVIL) 25 MG tablet Take 50 mg by mouth at bedtime. 1 po a.m. 3 po qhs    [provider]  atorvastatin (LIPITOR) 80 MG tablet TAKE 1 TABLET(80 MG) BY MOUTH DAILY AT 6 PM 08/29/16   Unk Pinto, MD  AVENOVA 0.01 % SOLN Apply to eyelids BID 09/04/14   [provider]  buprenorphine (BUTRANS) 10 MCG/HR PTWK patch Place 10 mcg onto the skin once a week.    [provider]  Calcium Citrate-Vitamin D (CITRACAL + D PO) Take 1 tablet by mouth daily. 1200 mg ER     [provider]  chlorhexidine (PERIDEX) 0.12 % solution  11/18/15   [provider]  Cholecalciferol (VITAMIN D3) 5000 UNITS TABS Take 5,000 mg by mouth daily.    [provider]  clindamycin (CLEOCIN) 300 MG capsule Take 1 capsule (300 mg total) by mouth 4 (four) times daily. 02/08/17   Dantrell Schertzer, Dellis Filbert, PA-C  CRANBERRY CONCENTRATE PO Take 1 capsule by mouth daily.    [provider]  cyclobenzaprine (FLEXERIL) 10 MG tablet TAKE 1 TABLET(10 MG) BY MOUTH AT BEDTIME 09/11/16   Vicie Mutters, PA-C  cycloSPORINE (RESTASIS) 0.05 % ophthalmic emulsion  08/07/14   [provider]  Eyelid Cleansers (AVENOVA) 0.01 % SOLN  09/04/14   [provider]  [place] 0.1 MG/24HR RING INSERT 1 RING VAGINALLY EVERY 3 MONTHS 08/29/16   Unk Pinto, MD  furosemide (LASIX) 40 MG tablet TAKE 1 TABLET BY MOUTH THREE TIMES DAILY FOR FLUID RETENTION Patient taking differently: TAKE 1 TABLET BY MOUTH daily 05/11/15   Unk Pinto, MD  levothyroxine (SYNTHROID, LEVOTHROID) 150 MCG tablet TAKE 1 TABLET BY MOUTH EVERY DAY 12/27/16   Vicie Mutters, PA-C  LOTEMAX 0.5 % GEL Place 1 drop into both eyes 3 (three) times daily.  07/26/14   [provider]  Magnesium Oxide 500 MG TABS Take 500 mg by mouth daily.    [provider]  NONFORMULARY OR COMPOUNDED ITEM Take 900 mg by mouth daily.    [provider]  Omega-3 Fatty Acids (FISH OIL) 1200 MG CAPS Take 1 capsule by mouth daily.    [provider]  Polyethyl Glycol-Propyl Glycol (SYSTANE) 0.4-0.3 % SOLN Apply 1 drop to eye 4 (four) times daily.    [provider]  PREVIDENT 5000 SENSITIVE 1.1-5 % PSTE  07/04/13   [provider]  RESTASIS 0.05 % ophthalmic emulsion  08/07/14   [provider]  Sod Fluoride-Potassium Nitrate (PREVIDENT 5000 SENSITIVE) 1.1-5 % PSTE  07/04/13   [provider]    Family History Family History  Problem Relation Age of  Onset  . Hypertension Father   . Stroke Father   . Diabetes Brother   . Asthma Brother   . Other Daughter        XED CONNECTIVE TISSUE  . Cancer Maternal Grandmother        BREAST  . Breast cancer Maternal Grandmother 58  . Colon cancer Neg Hx   . Esophageal cancer Neg Hx   . Stomach cancer Neg Hx   . Rectal cancer Neg Hx     Social History Social History  Substance Use Topics  . Smoking status: Former Smoker    Packs/day: 0.50    Years: 40.00    Types: Cigarettes    Quit date: 07/05/2015  . Smokeless tobacco: Never Used  .  Alcohol use 0.0 oz/week     Comment: rare     Allergies   Hydrocodone; Morphine and related; Sulfa antibiotics; Compazine [prochlorperazine edisylate]; Levaquin [levofloxacin]; Lyrica [pregabalin]; Ciprofloxacin; and Penicillins   Review of Systems Review of Systems  Constitutional: Negative for chills and fever.  Skin: Positive for color change (redness to the affected area).       +abscess to right inguinal area without drainage  All other systems reviewed and are negative.    Physical Exam Updated Vital Signs BP (!) 139/113 (BP Location: Right Arm)   Pulse (!) 107   Temp 98.3 F (36.8 C) (Oral)   Resp 20   LMP  (LMP Unknown)   SpO2 97%   Physical Exam  Constitutional: She is oriented to person, place, and time. She appears well-developed and well-nourished. No distress.  HENT:  Head: Normocephalic and atraumatic.  Eyes: EOM are normal.  Neck: Neck supple.  Cardiovascular: Normal rate.   Pulmonary/Chest: Effort normal. No respiratory distress.  Abdominal: She exhibits no distension.  Musculoskeletal: Normal range of motion.  Neurological: She is alert and oriented to person, place, and time.  Skin: Skin is warm and dry. There is erythema.  Right inguinal region with induration and overlaying redness and tenderness.   Psychiatric: She has a normal mood and affect. Her behavior is normal.  Nursing note and vitals  reviewed.    ED Treatments / Results  DIAGNOSTIC STUDIES: Oxygen Saturation is 97% on RA, nl by my interpretation.    COORDINATION OF CARE: 12:21 PM Discussed treatment plan with pt at bedside which includes I&D, bedside US, and pt agreed to plan.   Labs (all labs ordered are listed, but only abnormal results are displayed) Labs Reviewed  CBC WITH DIFFERENTIAL/PLATELET - Abnormal; Notable for the following:       Result Value   WBC 11.8 (*)    All other components within normal limits  BASIC METABOLIC PANEL - Abnormal; Notable for the following:    Calcium 8.8 (*)    All other components within normal limits  I-STAT CG4 LACTIC ACID, ED    EKG  EKG Interpretation None       Radiology No results found.  Procedures .Marland KitchenIncision and Drainage Date/Time: 02/08/2017 1:38 PM Performed by: Penni Bombard, Jahnavi Muratore Authorized by: Penni Bombard, Tateanna Bach   Consent:    Consent obtained:  Verbal   Consent given by:  Patient   Risks discussed:  Infection and pain Location:    Type:  Abscess   Location:  Anogenital   Anogenital location: right inguinal. Pre-procedure details:    Skin preparation:  Betadine Anesthesia (see MAR for exact dosages):    Anesthesia method:  Local infiltration   Local anesthetic:  Lidocaine 2% w/o epi (5 cc used) Procedure type:    Complexity:  Simple Procedure details:    Needle aspiration: no     Incision types:  Stab incision   Incision depth:  Dermal   Scalpel blade:  11   Wound management:  Probed and deloculated and irrigated with saline   Drainage:  Purulent   Drainage amount:  Moderate   Packing materials:  1/4 in iodoform gauze   Amount 1/4" iodoform:  6 cm used Post-procedure details:    Patient tolerance of procedure:  Tolerated well, no immediate complications [place] bedside Date/Time: 02/08/2017 1:38 PM Performed by: Penni Bombard, Delfino Friesen Authorized by: Penni Bombard, Kishaun Erekson  Consent: Verbal consent obtained. Consent given by: patient Patient understanding:  patient states understanding of the procedure being  performed Imaging studies: imaging studies available Patient identity confirmed: verbally with patient, hospital-assigned identification number, arm band and provided demographic data Time out: Immediately prior to procedure a "time out" was called to verify the correct patient, procedure, equipment, support staff and site/side marked as required. Preparation: Patient was prepped and draped in the usual sterile fashion. Local anesthesia used: no  Anesthesia: Local anesthesia used: no  Sedation: Patient sedated: no Patient tolerance: Patient tolerated the procedure well with no immediate complications    (including critical care time)  .edus  Medications Ordered in ED Medications  lidocaine (XYLOCAINE) 2 % (with pres) injection 400 mg (400 mg Infiltration Given by Other 02/08/17 1234)  sodium chloride 0.9 % bolus 1,000 mL (0 mLs Intravenous Stopped 02/08/17 1433)  ibuprofen (ADVIL,MOTRIN) tablet 600 mg (600 mg Oral Given 02/08/17 1445)  clindamycin (CLEOCIN) capsule 300 mg (300 mg Oral Given 02/08/17 1503)     Initial Impression / Assessment and Plan / ED Course  I have reviewed the triage vital signs and the nursing notes.  Pertinent labs & imaging results that were available during my care of the patient were reviewed by me and considered in my medical decision making (see chart for details).     Labs:   Imaging: bedside US  Consults:  Therapeutics: I&D  Discharge Meds:   Assessment/Plan: 56 year old female presents today with an abscess. Patient has small abscess with surrounding induration and overriding cellulitis. She is afebrile, slightly tachycardic here in the ED. I have low suspicion for significant systemic infection in this patient. She has very minor elevation in her white blood cell count, normal lactic acid. Patient is on Humira at this time and is currently followed by dermatology. I&D was successful here, patient  given a dose of antibiotics. She is stable for discharge and outpatient management. Patient will follow up in 2 days here for reassessment, follow-up with dermatology for ongoing management of chronic conditions. Patient verbalized understanding and agreement to today's plan had no further questions or concerns at time of discharge   Final Clinical Impressions(s) / ED Diagnoses   Final diagnoses:  Abscess    New Prescriptions Discharge Medication List as of 02/08/2017  3:25 PM    I personally performed the services described in this documentation, which was scribed in my presence. The recorded information has been reviewed and is accurate.    Okey Regal, PA-C 02/08/17 1722    Pattricia Boss, MD 02/13/17 [phone]

## 2017-02-10 ENCOUNTER — Emergency Department (HOSPITAL_COMMUNITY)
Admission: EM | Admit: 2017-02-10 | Discharge: 2017-02-10 | Disposition: A | Discharge status: Home / Self Care | Payer: Medicare Other | Attending: Emergency Medicine | Admitting: Emergency Medicine

## 2017-02-10 ENCOUNTER — Encounter (HOSPITAL_COMMUNITY): Payer: Self-pay | Admitting: Vascular Surgery

## 2017-02-10 DIAGNOSIS — I10 Essential (primary) hypertension: Secondary | ICD-10-CM | POA: Insufficient documentation

## 2017-02-10 DIAGNOSIS — E039 Hypothyroidism, unspecified: Secondary | ICD-10-CM | POA: Diagnosis not present

## 2017-02-10 DIAGNOSIS — J45909 Unspecified asthma, uncomplicated: Secondary | ICD-10-CM | POA: Insufficient documentation

## 2017-02-10 DIAGNOSIS — Z5189 Encounter for other specified aftercare: Secondary | ICD-10-CM

## 2017-02-10 DIAGNOSIS — L02214 Cutaneous abscess of groin: Secondary | ICD-10-CM | POA: Diagnosis not present

## 2017-02-10 DIAGNOSIS — Z87891 Personal history of nicotine dependence: Secondary | ICD-10-CM | POA: Insufficient documentation

## 2017-02-10 DIAGNOSIS — Z79899 Other long term (current) drug therapy: Secondary | ICD-10-CM | POA: Diagnosis not present

## 2017-02-10 DIAGNOSIS — Z85850 Personal history of malignant neoplasm of thyroid: Secondary | ICD-10-CM | POA: Diagnosis not present

## 2017-02-10 DIAGNOSIS — Z96653 Presence of artificial knee joint, bilateral: Secondary | ICD-10-CM | POA: Insufficient documentation

## 2017-02-10 NOTE — ED Triage Notes (Signed)
Pt reports to the ED for re-evaluation of abscess. She states that she had it I&D'ed on Tuesday and was told to f/u here for recheck. She states that she continues to have some pain and swelling but states it is much better. She is compliant with her abx as well.

## 2017-02-10 NOTE — ED Provider Notes (Signed)
[facility] DEPT Provider Note   CSN: [id number] Arrival date & time: 02/10/17  1315  By signing my name below, I, Dora Sims, attest that this documentation has been prepared under the direction and in the presence of Alyse Low, [place]. Electronically Signed: Dora Sims, Scribe. 02/10/2017. 3:11 PM.  History   Chief Complaint Chief Complaint  Patient presents with  . Follow-up   The history is provided by the patient. No language interpreter was used.    HPI Comments: KYLLIE PETTIJOHN is a 56 y.o. female who presents to the Emergency Department for a wound check. Patient had an incision and drainage of a right inguinal abscess performed here two days ago and was advised to return for a wound check in 2 days. She states the area is healing but there is still a "knot" in her right inguinal region. Patient has removed the packing material as instructed and has been taking her clindamycin as prescribed. She endorses pain with any degree of applied pressure to the wound. Patient notes the wound has been draining spontaneously over the last two days. She denies fevers, chills, nausea, vomiting, diarrhea, constipation, or any other associated symptoms.  Past Medical History:  Diagnosis Date  . Arthritis    OA, RA  . Asthma   . Autoimmune deficiency syndrome ([place])   . Back pain   . Cancer ([place])    thyroid-1995  . GERD (gastroesophageal reflux disease)   . Hiatal hernia   . Hyperlipidemia   . Hypertension   . Hypothyroidism   . Pneumonia     Patient Active Problem List   Diagnosis Date Noted  . Rheumatoid arthritis ([place]) 11/14/2015  . Medicare annual wellness visit, subsequent 04/24/2015  . Axillary hidradenitis suppurativa 07/05/2014  . Sjogren's disease ([employer]) 07/05/2014  . Medication management 03/26/2014  . Mixed hyperlipidemia 09/24/2013  . Prediabetes 09/24/2013  . Vitamin D deficiency 09/24/2013  . Intrinsic asthma   . Hypertension   . Hypothyroidism   .  Degeneration of intervertebral disc of lumbosacral region 05/23/2008    Past Surgical History:  Procedure Laterality Date  . ANTERIOR LAT LUMBAR FUSION Left 01/26/2013   Procedure: ANTERIOR LATERAL LUMBAR FUSION 1 LEVEL;  Surgeon: Faythe Ghee, MD;  Location: [facility];  Service: Neurosurgery;  Laterality: Left;  lumbar four-five  . KNEE ARTHROPLASTY Left 02/16/2016   Procedure: COMPUTER ASSISTED TOTAL KNEE ARTHROPLASTY;  Surgeon: Dereck Leep, MD;  Location: [facility] ORS;  Service: Orthopedics;  Laterality: Left;  . KNEE SURGERY     3 on right knee, 1 on left-prior to TKR  . LUMBAR PERCUTANEOUS PEDICLE SCREW 1 LEVEL Left 01/26/2013   Procedure: LUMBAR PERCUTANEOUS PEDICLE SCREW 1 LEVEL;  Surgeon: Faythe Ghee, MD;  Location: [facility] OR[facility];  Service: Neurosurgery;  Laterality: Left;  lumbar four-five  . PILONIDAL CYST EXCISION    . THYROIDECTOMY    . TONSILLECTOMY    . TOTAL KNEE ARTHROPLASTY     right  . UPPER GASTROINTESTINAL ENDOSCOPY    . VAGINAL HYSTERECTOMY      OB History    No data available       Home Medications    Prior to Admission medications   Medication Sig Start Date End Date Taking? Authorizing Provider  Adalimumab (HUMIRA PEN) 40 MG/0.4ML PNKT Inject 40 mg into the skin every 30 (thirty) days. 01/29/17   Unk Pinto, MD  albuterol [facility] HFA) 108 [phone] Base) MCG/ACT inhaler Inhale 1-2 puffs into the lungs every 6 (  six) hours as needed for wheezing or shortness of breath. Reported on 03/12/2016 03/12/16   Forcucci, Loma Sousa, PA-C  albuterol (PROVENTIL) (2.5 MG/3ML) 0.083% nebulizer solution Take 3 mLs (2.5 mg total) by nebulization every 6 (six) hours as needed for wheezing or shortness of breath. 09/12/13   Vicie Mutters, PA-C  amitriptyline (ELAVIL) 25 MG tablet Take 50 mg by mouth at bedtime. 1 po a.m. 3 po qhs    [provider]  atorvastatin (LIPITOR) 80 MG tablet TAKE 1 TABLET(80 MG) BY MOUTH DAILY AT 6 PM 08/29/16   Unk Pinto, MD  AVENOVA  0.01 % SOLN Apply to eyelids BID 09/04/14   [provider]  buprenorphine (BUTRANS) 10 MCG/HR PTWK patch Place 10 mcg onto the skin once a week.    [provider]  Calcium Citrate-Vitamin D (CITRACAL + D PO) Take 1 tablet by mouth daily. 1200 mg ER    [provider]  chlorhexidine (PERIDEX) 0.12 % solution  11/18/15   [provider]  Cholecalciferol (VITAMIN D3) 5000 UNITS TABS Take 5,000 mg by mouth daily.    [provider]  clindamycin (CLEOCIN) 300 MG capsule Take 1 capsule (300 mg total) by mouth 4 (four) times daily. 02/08/17   Hedges, Dellis Filbert, PA-C  CRANBERRY CONCENTRATE PO Take 1 capsule by mouth daily.    [provider]  cyclobenzaprine (FLEXERIL) 10 MG tablet TAKE 1 TABLET(10 MG) BY MOUTH AT BEDTIME 09/11/16   Vicie Mutters, PA-C  cycloSPORINE (RESTASIS) 0.05 % ophthalmic emulsion  08/07/14   [provider]  Eyelid Cleansers (AVENOVA) 0.01 % SOLN  09/04/14   [provider]  [place] 0.1 MG/24HR RING INSERT 1 RING VAGINALLY EVERY 3 MONTHS 08/29/16   Unk Pinto, MD  furosemide (LASIX) 40 MG tablet TAKE 1 TABLET BY MOUTH THREE TIMES DAILY FOR FLUID RETENTION Patient taking differently: TAKE 1 TABLET BY MOUTH daily 05/11/15   Unk Pinto, MD  levothyroxine (SYNTHROID, LEVOTHROID) 150 MCG tablet TAKE 1 TABLET BY MOUTH EVERY DAY 12/27/16   Vicie Mutters, PA-C  LOTEMAX 0.5 % GEL Place 1 drop into both eyes 3 (three) times daily.  07/26/14   [provider]  Magnesium Oxide 500 MG TABS Take 500 mg by mouth daily.    [provider]  NONFORMULARY OR COMPOUNDED ITEM Take 900 mg by mouth daily.    [provider]  Omega-3 Fatty Acids (FISH OIL) 1200 MG CAPS Take 1 capsule by mouth daily.    [provider]  Polyethyl Glycol-Propyl Glycol (SYSTANE) 0.4-0.3 % SOLN Apply 1 drop to eye 4 (four) times daily.    [provider]  PREVIDENT 5000 SENSITIVE 1.1-5 % PSTE  07/04/13    [provider]  RESTASIS 0.05 % ophthalmic emulsion  08/07/14   [provider]  Sod Fluoride-Potassium Nitrate (PREVIDENT 5000 SENSITIVE) 1.1-5 % PSTE  07/04/13   [provider]    Family History Family History  Problem Relation Age of Onset  . Hypertension Father   . Stroke Father   . Diabetes Brother   . Asthma Brother   . Other Daughter        XED CONNECTIVE TISSUE  . Cancer Maternal Grandmother        BREAST  . Breast cancer Maternal Grandmother 1  . Colon cancer Neg Hx   . Esophageal cancer Neg Hx   . Stomach cancer Neg Hx   . Rectal cancer Neg Hx     Social History Social History  Substance Use Topics  . Smoking status: Former Smoker    Packs/day: 0.50    Years: 40.00    Types: Cigarettes    Quit date: 07/05/2015  . Smokeless tobacco: Never Used  . Alcohol use 0.0 oz/week     Comment: rare     Allergies   Hydrocodone; Morphine and related; Sulfa antibiotics; Compazine [prochlorperazine edisylate]; Levaquin [levofloxacin]; Lyrica [pregabalin]; Ciprofloxacin; and Penicillins   Review of Systems Review of Systems  Constitutional: Negative for chills and fever.  Gastrointestinal: Negative for constipation, diarrhea, nausea and vomiting.  Skin: Positive for wound.  All other systems reviewed and are negative.  Physical Exam Updated Vital Signs BP 120/81 (BP Location: Left Arm)   Pulse 91   Temp 98 F (36.7 C) (Oral)   Resp 16   Ht 5' 3.5" (1.613 m)   Wt 195 lb (88.5 kg)   LMP  (LMP Unknown)   SpO2 97%   BMI 34.00 kg/m   Physical Exam  Constitutional: She is oriented to person, place, and time. She appears well-developed and well-nourished. No distress.  HENT:  Head: Normocephalic and atraumatic.  Eyes: Conjunctivae and EOM are normal.  Neck: Neck supple. No tracheal deviation present.  Cardiovascular: Normal rate.   Pulmonary/Chest: Effort normal. No respiratory distress.  Musculoskeletal: Normal range of motion.    Neurological: She is alert and oriented to person, place, and time.  Skin: Skin is warm and dry.  1 cm area of induration to the right inguinal area that is extremely tender to palpation.   Psychiatric: She has a normal mood and affect. Her behavior is normal.  Nursing note and vitals reviewed.  ED Treatments / Results  Labs (all labs ordered are listed, but only abnormal results are displayed) Labs Reviewed - No data to display  EKG  EKG Interpretation None       Radiology No results found.  Procedures Procedures (including critical care time)  DIAGNOSTIC STUDIES: Oxygen Saturation is 97% on RA, normal by my interpretation.    COORDINATION OF CARE: 3:11 PM Discussed treatment plan with pt at bedside and pt agreed to plan.  Medications Ordered in ED Medications - No data to display   Initial Impression / Assessment and Plan / ED Course  I have reviewed the triage vital signs and the nursing notes.  Pertinent labs & imaging results that were available during my care of the patient were reviewed by me and considered in my medical decision making (see chart for details).      Final Clinical Impressions(s) / ED Diagnoses   Final diagnoses:  Wound check, abscess    New Prescriptions New Prescriptions   No medications on file   Continue current medications [place] area   Sidney Ace 02/10/17 1530    Quintella Reichert, MD 02/11/17 0900

## 2017-02-10 NOTE — Discharge Instructions (Signed)
Return if any problems.  Continue antibiotics.

## 2017-02-10 NOTE — ED Notes (Signed)
See EDP secondary assessment.  

## 2017-02-14 ENCOUNTER — Other Ambulatory Visit: Payer: Self-pay | Admitting: Internal Medicine

## 2017-02-14 ENCOUNTER — Telehealth: Payer: Self-pay | Admitting: *Deleted

## 2017-02-14 NOTE — Telephone Encounter (Signed)
Patient called and states she is taking Clinamycin 300 mg from the ER due to an abdominal abscess . She is having a sore throat and a low grade fever.  Per Dr Melford Aase the antibiotic should help the sore throat, unless the sore throat is viral. If that is the case, it will take time to subside.

## 2017-02-22 DIAGNOSIS — Z96652 Presence of left artificial knee joint: Secondary | ICD-10-CM | POA: Diagnosis not present

## 2017-03-07 ENCOUNTER — Ambulatory Visit (INDEPENDENT_AMBULATORY_CARE_PROVIDER_SITE_OTHER): Payer: Medicare Other | Admitting: Physician Assistant

## 2017-03-07 ENCOUNTER — Encounter: Payer: Self-pay | Admitting: Physician Assistant

## 2017-03-07 VITALS — BP 122/84 | HR 102 | Temp 97.5°F | Resp 14 | Ht 63.5 in | Wt 199.8 lb

## 2017-03-07 DIAGNOSIS — L02214 Cutaneous abscess of groin: Secondary | ICD-10-CM

## 2017-03-07 DIAGNOSIS — H65191 Other acute nonsuppurative otitis media, right ear: Secondary | ICD-10-CM

## 2017-03-07 MED ORDER — DOXYCYCLINE HYCLATE 100 MG PO CAPS: ORAL_CAPSULE | ORAL | 0 refills | Status: DC

## 2017-03-07 MED ORDER — MECLIZINE HCL 25 MG PO TABS: ORAL_TABLET | ORAL | 0 refills | Status: DC

## 2017-03-07 MED ORDER — PREDNISONE 20 MG PO TABS: ORAL_TABLET | ORAL | 0 refills | Status: DC

## 2017-03-07 NOTE — Progress Notes (Signed)
Subjective:    Patient ID: Autumn Frye, female    DOB: [birth year], 56 y.o.   MRN: [id number]  HPI 56 y.o. WF presents with knot on right inguinal area and cold.  She went to the ER for abscess 06/05, had I&D, was on clindamycin for 10 days, had last wound check 06/07, no longer painful but feels nontender, lump right inguinal area. No fever or chills.   She has been having cold x June 8th, sore throat, cough with mucus, vertigo with lying down or sitting up, has history, and sinus congestion.   Blood pressure 122/84, pulse (!) 102, temperature 97.5 F (36.4 C), resp. rate 14, height 5' 3.5" (1.613 m), weight 199 lb 12.8 oz (90.6 kg), SpO2 97 %.  Medications Current Outpatient Prescriptions on File Prior to Visit  Medication Sig  . Adalimumab (HUMIRA PEN) 40 MG/0.4ML PNKT Inject 40 mg into the skin every 30 (thirty) days.  Marland Kitchen albuterol (PROAIR HFA) 108 (90 Base) MCG/ACT inhaler Inhale 1-2 puffs into the lungs every 6 (six) hours as needed for wheezing or shortness of breath. Reported on 03/12/2016  . albuterol (PROVENTIL) (2.5 MG/3ML) 0.083% nebulizer solution Take 3 mLs (2.5 mg total) by nebulization every 6 (six) hours as needed for wheezing or shortness of breath.  Marland Kitchen amitriptyline (ELAVIL) 25 MG tablet Take 50 mg by mouth at bedtime. 1 po a.m. 3 po qhs  . atorvastatin (LIPITOR) 80 MG tablet TAKE 1 TABLET(80 MG) BY MOUTH DAILY AT 6 PM  . AVENOVA 0.01 % SOLN Apply to eyelids BID  . buprenorphine (BUTRANS) 10 MCG/HR PTWK patch Place 10 mcg onto the skin once a week.  . Calcium Citrate-Vitamin D (CITRACAL + D PO) Take 1 tablet by mouth daily. 1200 mg ER  . chlorhexidine (PERIDEX) 0.12 % solution   . Cholecalciferol (VITAMIN D3) 5000 UNITS TABS Take 5,000 mg by mouth daily.  Marland Kitchen CRANBERRY CONCENTRATE PO Take 1 capsule by mouth daily.  . cyclobenzaprine (FLEXERIL) 10 MG tablet TAKE 1 TABLET(10 MG) BY MOUTH AT BEDTIME  . cycloSPORINE (RESTASIS) 0.05 % ophthalmic emulsion   . Eyelid Cleansers  (AVENOVA) 0.01 % SOLN   . FEMRING 0.1 MG/24HR RING INSERT 1 RING VAGINALLY EVERY 3 MONTHS  . furosemide (LASIX) 40 MG tablet TAKE 1 TABLET BY MOUTH THREE TIMES DAILY FOR FLUID RETENTION (Patient taking differently: TAKE 1 TABLET BY MOUTH daily)  . levothyroxine (SYNTHROID, LEVOTHROID) 150 MCG tablet TAKE 1 TABLET BY MOUTH EVERY DAY  . LOTEMAX 0.5 % GEL Place 1 drop into both eyes 3 (three) times daily.   . Magnesium Oxide 500 MG TABS Take 500 mg by mouth daily.  . NONFORMULARY OR COMPOUNDED ITEM Take 900 mg by mouth daily.  . Omega-3 Fatty Acids (FISH OIL) 1200 MG CAPS Take 1 capsule by mouth daily.  Vladimir Faster Glycol-Propyl Glycol (SYSTANE) 0.4-0.3 % SOLN Apply 1 drop to eye 4 (four) times daily.  Marland Kitchen PREVIDENT 5000 SENSITIVE 1.1-5 % PSTE   . RESTASIS 0.05 % ophthalmic emulsion   . Sod Fluoride-Potassium Nitrate (PREVIDENT 5000 SENSITIVE) 1.1-5 % PSTE    No current facility-administered medications on file prior to visit.     Problem list She has Intrinsic asthma; Hypertension; Hypothyroidism; Mixed hyperlipidemia; Prediabetes; Vitamin D deficiency; Medication management; Axillary hidradenitis suppurativa; Sjogren's disease ([place]); Medicare annual wellness visit, subsequent; Rheumatoid arthritis ([place]); and Degeneration of intervertebral disc of lumbosacral region on her problem list.   Review of Systems  Constitutional: Negative for chills, diaphoresis, fatigue and  fever.  HENT: Positive for congestion, postnasal drip, sinus pressure and sore throat. Negative for ear pain and sneezing.   Respiratory: Positive for cough. Negative for chest tightness, shortness of breath and wheezing.   Cardiovascular: Negative for chest pain, palpitations and leg swelling.  Gastrointestinal: Negative.   Genitourinary: Negative.   Musculoskeletal: Negative for neck pain.  Neurological: Negative for headaches.       Objective:   Physical Exam  Constitutional: She appears well-developed and  well-nourished.  HENT:  Head: Normocephalic and atraumatic.  Right Ear: External ear normal.  Nose: Right sinus exhibits maxillary sinus tenderness. Right sinus exhibits no frontal sinus tenderness. Left sinus exhibits maxillary sinus tenderness. Left sinus exhibits no frontal sinus tenderness.  Eyes: Conjunctivae and EOM are normal.  Neck: Normal range of motion. Neck supple.  Cardiovascular: Normal rate, regular rhythm, normal heart sounds and intact distal pulses.   Pulmonary/Chest: Effort normal and breath sounds normal. No respiratory distress. She has no wheezes.  Abdominal: Soft. Bowel sounds are normal.  Lymphadenopathy:    She has no cervical adenopathy.       Right: Inguinal (nontender, nodule right inguinal) adenopathy present.  Skin: Skin is warm and dry.       Assessment & Plan:  1. Acute otitis media with effusion of right ear - predniSONE (DELTASONE) 20 MG tablet; 2 tablets daily for 3 days, 1 tablet daily for 4 days.  Dispense: 10 tablet; Refill: 0 - meclizine (ANTIVERT) 25 MG tablet; 1/2-1 pill up to 3 times daily for motion sickness/dizziness  Dispense: 30 tablet; Refill: 0  2. Soft tissue abscess of inguinal region - predniSONE (DELTASONE) 20 MG tablet; 2 tablets daily for 3 days, 1 tablet daily for 4 days.  Dispense: 10 tablet; Refill: 0 - doxycycline (VIBRAMYCIN) 100 MG capsule; Take 1 capsule twice daily with food  Dispense: 20 capsule; Refill: 0   The patient was advised to call immediately if she has any concerning symptoms in the interval. The patient voices understanding of current treatment options and is in agreement with the current care plan.The patient knows to call the clinic with any problems, questions or concerns or go to the ER if any further progression of symptoms.

## 2017-03-07 NOTE — Patient Instructions (Addendum)
Your ears and sinuses are connected by the eustachian tube. When your sinuses are inflamed, this can close off the tube and cause fluid to collect in your middle ear. This can then cause dizziness, popping, clicking, ringing, and echoing in your ears. This is often NOT an infection and does NOT require antibiotics, it is caused by inflammation so the treatments help the inflammation. This can take a long time to get better so please be patient.  Here are things you can do to help with this: - Try the Flonase or Nasonex. Remember to spray each nostril twice towards the outer part of your eye.  Do not sniff but instead pinch your nose and tilt your head back to help the medicine get into your sinuses.  The best time to do this is at bedtime.Stop if you get blurred vision or nose bleeds.  -While drinking fluids, pinch and hold nose close and swallow, to help open eustachian tubes to drain fluid behind ear drums. -Please pick one of the over the counter allergy medications below and take it once daily for allergies.  It will also help with fluid behind ear drums. Claritin or loratadine cheapest but likely the weakest  Zyrtec or certizine at night because it can make you sleepy The strongest is allegra or fexafinadine  Cheapest at [employer], [employer], [employer] -can use decongestant over the counter, please do not use if you have high blood pressure or certain heart conditions.   if worsening HA, changes vision/speech, imbalance, weakness go to the ER  Make sure you are on an allergy pill, see below for more details. Please take the prednisone as directed below, this is NOT an antibiotic so you do NOT have to finish it. You can take it for a few days and stop it if you are doing better.   Please take the prednisone to help decrease inflammation and therefore decrease symptoms. Take it it with food to avoid GI upset. It can cause increased energy but on the other hand it can make it hard to sleep at night so  please take it AT [place], it takes 8-12 hours to start working so it will NOT affect your sleeping if you take it at night with your food!!  If you are diabetic it will increase your sugars so decrease carbs and monitor your sugars closely.      If the abscess is not better we will get an [place]   Skin Abscess A skin abscess is an infected area on or under your skin that contains a collection of pus and other material. An abscess may also be called a furuncle, carbuncle, or boil. An abscess can occur in or on almost any part of your body. Some abscesses break open (rupture) on their own. Most continue to get worse unless they are treated. The infection can spread deeper into the body and eventually into your blood, which can make you feel ill. Treatment usually involves draining the abscess. What are the causes? An abscess occurs when germs, often bacteria, pass through your skin and cause an infection. This may be caused by:  A scrape or cut on your skin.  A puncture wound through your skin, including a needle injection.  Blocked oil or sweat glands.  Blocked and infected hair follicles.  A cyst that forms beneath your skin (sebaceous cyst) and becomes infected.  What increases the risk? This condition is more likely to develop in people who:  Have a weak body  defense system (immune system).  Have diabetes.  Have dry and irritated skin.  Get frequent injections or use illegal IV drugs.  Have a foreign body in a wound, such as a splinter.  Have problems with their lymph system or veins.  What are the signs or symptoms? An abscess may start as a painful, firm bump under the skin. Over time, the abscess may get larger or become softer. Pus may appear at the top of the abscess, causing pressure and pain. It may eventually break through the skin and drain. Other symptoms include:  Redness.  Warmth.  Swelling.  Tenderness.  A sore on the skin.  How is this  diagnosed? This condition is diagnosed based on your medical history and a physical exam. A sample of pus may be taken from the abscess to find out what is causing the infection and what antibiotics can be used to treat it. You also may have:  Blood tests to look for signs of infection or spread of an infection to your blood.  Imaging studies such as ultrasound, CT scan, or MRI if the abscess is deep.  How is this treated? Small abscesses that drain on their own may not need treatment. Treatment for an abscess that does not rupture on its own may include:  Warm compresses applied to the area several times per day.  Incision and drainage. Your health care provider will make an incision to open the abscess and will remove pus and any foreign body or dead tissue. The incision area may be packed with gauze to keep it open for a few days while it heals.  Antibiotic medicines to treat infection. For a severe abscess, you may first get antibiotics through an IV and then change to oral antibiotics.  Follow these instructions at home: Abscess Care  If you have an abscess that has not drained, place a warm, clean, wet washcloth over the abscess several times a day. Do this as told by your health care provider.  Follow instructions from your health care provider about how to take care of your abscess. Make sure you: ? Cover the abscess with a bandage (dressing). ? Change your dressing or gauze as told by your health care provider. ? Wash your hands with soap and water before you change the dressing or gauze. If soap and water are not available, use hand sanitizer.  Check your abscess every day for signs of a worsening infection. Check for: ? More redness, swelling, or pain. ? More fluid or blood. ? Warmth. ? More pus or a bad smell. Medicines  Take over-the-counter and prescription medicines only as told by your health care provider.  If you were prescribed an antibiotic medicine, take it as  told by your health care provider. Do not stop taking the antibiotic even if you start to feel better. General instructions  To avoid spreading the infection: ? Do not share personal care items, towels, or hot tubs with others. ? Avoid making skin contact with other people.  Keep all follow-up visits as told by your health care provider. This is important. Contact a health care provider if:  You have more redness, swelling, or pain around your abscess.  You have more fluid or blood coming from your abscess.  Your abscess feels warm to the touch.  You have more pus or a bad smell coming from your abscess.  You have a fever.  You have muscle aches.  You have chills or a general ill feeling.  Get help right away if:  You have severe pain.  You see red streaks on your skin spreading away from the abscess. This information is not intended to replace advice given to you by your health care provider. Make sure you discuss any questions you have with your health care provider. Document Released: 06/02/2005 Document Revised: 04/18/2016 Document Reviewed: 07/02/2015 Elsevier Interactive Patient Education  [employer].

## 2017-03-24 ENCOUNTER — Other Ambulatory Visit: Payer: Self-pay | Admitting: Physician Assistant

## 2017-04-13 DIAGNOSIS — Z79899 Other long term (current) drug therapy: Secondary | ICD-10-CM | POA: Diagnosis not present

## 2017-04-13 DIAGNOSIS — M25539 Pain in unspecified wrist: Secondary | ICD-10-CM | POA: Diagnosis not present

## 2017-04-13 DIAGNOSIS — M797 Fibromyalgia: Secondary | ICD-10-CM | POA: Diagnosis not present

## 2017-04-13 DIAGNOSIS — M0609 Rheumatoid arthritis without rheumatoid factor, multiple sites: Secondary | ICD-10-CM | POA: Diagnosis not present

## 2017-04-28 DIAGNOSIS — M4722 Other spondylosis with radiculopathy, cervical region: Secondary | ICD-10-CM | POA: Diagnosis not present

## 2017-04-28 DIAGNOSIS — M4727 Other spondylosis with radiculopathy, lumbosacral region: Secondary | ICD-10-CM | POA: Diagnosis not present

## 2017-04-28 NOTE — Progress Notes (Signed)
Assessment and Plan:  Hypertension:  -Continue medication,  -monitor blood pressure at home.  -Continue DASH diet.   -Reminder to go to the ER if any CP, SOB, nausea, dizziness, severe HA, changes vision/speech, left arm numbness and tingling, and jaw pain.  Cholesterol: -Continue diet and exercise.  -Check cholesterol.   Vitamin D Def: -continue medications.   Hypothyroidism -TSH -cont levothyroxine  Rheumatoid arthritis -continue follow up  Sjogren's syndrome, with unspecified organ involvement ([place]) Continue follow up  Morbid Obesity with co morbidities - long discussion about weight loss, diet, and exercise   Continue diet and meds as discussed. Further disposition pending results of labs. Future Appointments Date Time Provider [place]  08/02/2017 10:30 AM Unk Pinto, MD [facility] None  02/14/2018 9:00 AM Unk Pinto, MD [facility] None    HPI 56 y.o. female  presents for 3 month follow up with hypertension, hyperlipidemia, prediabetes and vitamin D.   Her blood pressure has been controlled at home, today their BP is BP: 138/82.   She does not workout. She denies chest pain, shortness of breath, dizziness. She is following with [facility] with [place] [facility], she feel in her kitchen and hit a stool, getting cervical and lumbar MRI's, not set up yet.   She is on cholesterol medication, 1/2 pill 3 days a week and denies myalgias. Her cholesterol is at goal. The cholesterol last visit was:   Lab Results  Component Value Date   CHOL 304 (H) 01/28/2017   HDL 79 01/28/2017   LDLCALC 206 (H) 01/28/2017   TRIG 96 01/28/2017   CHOLHDL 3.8 01/28/2017    She has been working on diet and exercise for prediabetes, and denies foot ulcerations, hyperglycemia, hypoglycemia , increased appetite, nausea, paresthesia of the feet, polydipsia, polyuria, visual disturbances, vomiting and weight loss. Last A1C in the office was:  Lab Results  Component Value  Date   HGBA1C 5.5 01/28/2017   Patient is on Vitamin D supplement.  Lab Results  Component Value Date   VD25OH 51 01/28/2017     She is on thyroid medication. Her medication was changed last visit, 1/2 pill 3 days a week and 1 pill the rest of the time.    Lab Results  Component Value Date   TSH 0.21 (L) 01/28/2017  .  BMI is Body mass index is 35.05 kg/m., she is working on diet and exercise. Wt Readings from Last 3 Encounters:  05/02/17 201 lb (91.2 kg)  03/07/17 199 lb 12.8 oz (90.6 kg)  02/10/17 195 lb (88.5 kg)   Current Medications:  Current Outpatient Prescriptions on File Prior to Visit  Medication Sig Dispense Refill  . albuterol (PROAIR HFA) 108 (90 Base) MCG/ACT inhaler Inhale 1-2 puffs into the lungs every 6 (six) hours as needed for wheezing or shortness of breath. Reported on 03/12/2016 18 g 3  . albuterol (PROVENTIL) (2.5 MG/3ML) 0.083% nebulizer solution Take 3 mLs (2.5 mg total) by nebulization every 6 (six) hours as needed for wheezing or shortness of breath. 60 vial 1  . amitriptyline (ELAVIL) 25 MG tablet Take 50 mg by mouth at bedtime. 1 po a.m. 3 po qhs    . atorvastatin (LIPITOR) 80 MG tablet TAKE 1 TABLET(80 MG) BY MOUTH DAILY AT 6 PM 90 tablet 1  . AVENOVA 0.01 % SOLN Apply to eyelids BID  1  . buprenorphine (BUTRANS) 10 MCG/HR PTWK patch Place 10 mcg onto the skin once a week.    . chlorhexidine (PERIDEX) 0.12 %  solution   1  . Cholecalciferol (VITAMIN D3) 5000 UNITS TABS Take 5,000 mg by mouth daily.    Marland Kitchen CRANBERRY CONCENTRATE PO Take 1 capsule by mouth daily.    . cyclobenzaprine (FLEXERIL) 10 MG tablet TAKE 1 TABLET(10 MG) BY MOUTH AT BEDTIME 90 tablet 0  . Eyelid Cleansers (AVENOVA) 0.01 % SOLN     . FEMRING 0.1 MG/24HR RING INSERT 1 RING VAGINALLY EVERY 3 MONTHS 1 each 3  . furosemide (LASIX) 40 MG tablet TAKE 1 TABLET BY MOUTH THREE TIMES DAILY FOR FLUID RETENTION (Patient taking differently: TAKE 1 TABLET BY MOUTH daily) 270 tablet 1  . levothyroxine  (SYNTHROID, LEVOTHROID) 150 MCG tablet TAKE 1 TABLET BY MOUTH EVERY DAY 90 tablet 0  . LOTEMAX 0.5 % GEL Place 1 drop into both eyes 3 (three) times daily.   2  . Magnesium Oxide 500 MG TABS Take 500 mg by mouth daily.    . NONFORMULARY OR COMPOUNDED ITEM Take 900 mg by mouth daily.    . Omega-3 Fatty Acids (FISH OIL) 1200 MG CAPS Take 1 capsule by mouth daily.    Vladimir Faster Glycol-Propyl Glycol (SYSTANE) 0.4-0.3 % SOLN Apply 1 drop to eye 4 (four) times daily.    Marland Kitchen PREVIDENT 5000 SENSITIVE 1.1-5 % PSTE     . RESTASIS 0.05 % ophthalmic emulsion   3  . Sod Fluoride-Potassium Nitrate (PREVIDENT 5000 SENSITIVE) 1.1-5 % PSTE      No current facility-administered medications on file prior to visit.     Medical History:  Past Medical History:  Diagnosis Date  . Arthritis    OA, RA  . Asthma   . Autoimmune deficiency syndrome ([place])   . Back pain   . Cancer ([place])    thyroid-1995  . GERD (gastroesophageal reflux disease)   . Hiatal hernia   . Hyperlipidemia   . Hypertension   . Hypothyroidism   . Pneumonia     Allergies:  Allergies  Allergen Reactions  . Hydrocodone Itching  . Morphine And Related Itching  . Sulfa Antibiotics Itching    Other reaction(s): Other (See Comments)  . Compazine [Prochlorperazine Edisylate]   . Levaquin [Levofloxacin] Hives  . Lyrica [Pregabalin] Other (See Comments)    Altered mental status Other reaction(s): Other (See Comments) hallucinations hallucinations  . Ciprofloxacin Hives and Swelling  . Penicillins Itching     Review of Systems:  Review of Systems  Constitutional: Negative for chills, fever and malaise/fatigue.  HENT: Negative for congestion, ear pain and sore throat.   Eyes: Negative.   Respiratory: Negative for cough, shortness of breath and wheezing.   Cardiovascular: Negative for chest pain, palpitations and leg swelling.  Gastrointestinal: Negative for abdominal pain, blood in stool, constipation, diarrhea, heartburn and  melena.  Genitourinary: Negative.   Musculoskeletal: Positive for joint pain.  Skin: Negative.   Neurological: Negative for dizziness, sensory change, loss of consciousness and headaches.  Psychiatric/Behavioral: Negative for depression. The patient is not nervous/anxious and does not have insomnia.     Family history- Review and unchanged  Social history- Review and unchanged  Physical Exam: BP 138/82   Pulse 72   Temp 97.7 F (36.5 C)   Wt 201 lb (91.2 kg)   LMP  (LMP Unknown)   SpO2 98%   BMI 35.05 kg/m  Wt Readings from Last 3 Encounters:  05/02/17 201 lb (91.2 kg)  03/07/17 199 lb 12.8 oz (90.6 kg)  02/10/17 195 lb (88.5 kg)    General  Appearance: Well nourished well developed, in no apparent distress. Eyes: PERRLA, EOMs, conjunctiva no swelling or erythema ENT/Mouth: Ear canals normal without obstruction, swelling, erythma, discharge.  TMs normal bilaterally.  Oropharynx moist, clear, without exudate, or postoropharyngeal swelling. Neck: Supple, thyroid normal,no cervical adenopathy  Respiratory: Respiratory effort normal, Breath sounds clear A&P without rhonchi, wheeze, or rale.  No retractions, no accessory usage. Cardio: RRR with no MRGs. Brisk peripheral pulses without edema.  Abdomen: Soft, + BS,  Non tender, no guarding, rebound, hernias, masses. Musculoskeletal: Full ROM, 5/5 strength, antalgic gait.   Skin: Warm, dry without rashes, lesions, ecchymosis.  Neuro: Awake and oriented X 3, Cranial nerves intact. Normal muscle tone, no cerebellar symptoms. Psych: Normal affect, Insight and Judgment appropriate.    Vicie Mutters, PA-C 9:47 AM [facility]

## 2017-05-02 ENCOUNTER — Encounter: Payer: Self-pay | Admitting: Physician Assistant

## 2017-05-02 ENCOUNTER — Other Ambulatory Visit: Payer: Self-pay | Admitting: Neurological Surgery

## 2017-05-02 ENCOUNTER — Ambulatory Visit (INDEPENDENT_AMBULATORY_CARE_PROVIDER_SITE_OTHER): Payer: Medicare Other | Admitting: Physician Assistant

## 2017-05-02 VITALS — BP 138/82 | HR 72 | Temp 97.7°F | Wt 201.0 lb

## 2017-05-02 DIAGNOSIS — M4727 Other spondylosis with radiculopathy, lumbosacral region: Secondary | ICD-10-CM

## 2017-05-02 DIAGNOSIS — E782 Mixed hyperlipidemia: Secondary | ICD-10-CM | POA: Diagnosis not present

## 2017-05-02 DIAGNOSIS — M069 Rheumatoid arthritis, unspecified: Secondary | ICD-10-CM

## 2017-05-02 DIAGNOSIS — E039 Hypothyroidism, unspecified: Secondary | ICD-10-CM

## 2017-05-02 DIAGNOSIS — M3500 Sicca syndrome, unspecified: Secondary | ICD-10-CM

## 2017-05-02 DIAGNOSIS — Z79899 Other long term (current) drug therapy: Secondary | ICD-10-CM | POA: Diagnosis not present

## 2017-05-02 DIAGNOSIS — R7303 Prediabetes: Secondary | ICD-10-CM | POA: Diagnosis not present

## 2017-05-02 DIAGNOSIS — I10 Essential (primary) hypertension: Secondary | ICD-10-CM | POA: Diagnosis not present

## 2017-05-02 DIAGNOSIS — M4722 Other spondylosis with radiculopathy, cervical region: Secondary | ICD-10-CM

## 2017-05-02 NOTE — Patient Instructions (Signed)
Hypothyroidism Hypothyroidism is a disorder of the thyroid. The thyroid is a large gland that is located in the lower front of the neck. The thyroid releases hormones that control how the body works. With hypothyroidism, the thyroid does not make enough of these hormones. What are the causes? Causes of hypothyroidism may include:  Viral infections.  Pregnancy.  Your own defense system (immune system) attacking your thyroid.  Certain medicines.  Birth defects.  Past radiation treatments to your head or neck.  Past treatment with radioactive iodine.  Past surgical removal of part or all of your thyroid.  Problems with the gland that is located in the center of your brain (pituitary).  What are the signs or symptoms? Signs and symptoms of hypothyroidism may include:  Feeling as though you have no energy (lethargy).  Inability to tolerate cold.  Weight gain that is not explained by a change in diet or exercise habits.  Dry skin.  Coarse hair.  Menstrual irregularity.  Slowing of thought processes.  Constipation.  Sadness or depression.  How is this diagnosed? Your health care provider may diagnose hypothyroidism with blood tests and ultrasound tests. How is this treated? Hypothyroidism is treated with medicine that replaces the hormones that your body does not make. After you begin treatment, it may take several weeks for symptoms to go away. Follow these instructions at home:  Take medicines only as directed by your health care provider.  If you start taking any new medicines, tell your health care provider.  Keep all follow-up visits as directed by your health care provider. This is important. As your condition improves, your dosage needs may change. You will need to have blood tests regularly so that your health care provider can watch your condition. Contact a health care provider if:  Your symptoms do not get better with treatment.  You are taking thyroid  replacement medicine and: ? You sweat excessively. ? You have tremors. ? You feel anxious. ? You lose weight rapidly. ? You cannot tolerate heat. ? You have emotional swings. ? You have diarrhea. ? You feel weak. Get help right away if:  You develop chest pain.  You develop an irregular heartbeat.  You develop a rapid heartbeat. This information is not intended to replace advice given to you by your health care provider. Make sure you discuss any questions you have with your health care provider. Document Released: 08/23/2005 Document Revised: 01/29/2016 Document Reviewed: 01/08/2014 Elsevier Interactive Patient Education  2017 [employer]  

## 2017-05-03 ENCOUNTER — Other Ambulatory Visit: Payer: Self-pay | Admitting: Physician Assistant

## 2017-05-03 DIAGNOSIS — E039 Hypothyroidism, unspecified: Secondary | ICD-10-CM

## 2017-05-03 DIAGNOSIS — E782 Mixed hyperlipidemia: Secondary | ICD-10-CM

## 2017-05-05 LAB — LIPID PANEL
CHOLESTEROL: 328 mg/dL — AB (ref <200)
HDL: 58 mg/dL (ref >50)
LDL Cholesterol (Calc): 236 mg/dL (calc) — ABNORMAL HIGH
Non-HDL Cholesterol (Calc): 270 mg/dL (calc) — ABNORMAL HIGH (ref <130)
TRIGLYCERIDES: 174 mg/dL — AB (ref <150)
Total CHOL/HDL Ratio: 5.7 (calc) — ABNORMAL HIGH (ref <5.0)

## 2017-05-05 LAB — CBC WITH DIFFERENTIAL/PLATELET
BASOS PCT: 0.3 %
Basophils Absolute: 18 cells/uL (ref 0–200)
Eosinophils Absolute: 49 cells/uL (ref 15–500)
Eosinophils Relative: 0.8 %
HEMATOCRIT: 39.3 % (ref 35.0–45.0)
HEMOGLOBIN: 13.2 g/dL (ref 11.7–15.5)
LYMPHS ABS: 3300 {cells}/uL (ref 850–3900)
MCH: 29 pg (ref 27.0–33.0)
MCHC: 33.6 g/dL (ref 32.0–36.0)
MCV: 86.4 fL (ref 80.0–100.0)
MPV: 10 fL (ref 7.5–12.5)
Monocytes Relative: 5.9 %
NEUTROS ABS: 2373 {cells}/uL (ref 1500–7800)
Neutrophils Relative %: 38.9 %
PLATELETS: 315 10*3/uL (ref 140–400)
RBC: 4.55 10*6/uL (ref 3.80–5.10)
RDW: 13.9 % (ref 11.0–15.0)
TOTAL LYMPHOCYTE: 54.1 %
WBC: 6.1 10*3/uL (ref 3.8–10.8)
WBCMIX: 360 {cells}/uL (ref 200–950)

## 2017-05-05 LAB — HEPATIC FUNCTION PANEL
AG Ratio: 1.7 (calc) (ref 1.0–2.5)
ALBUMIN MSPROF: 4.1 g/dL (ref 3.6–5.1)
ALT: 29 U/L (ref 6–29)
AST: 22 U/L (ref 10–35)
Alkaline phosphatase (APISO): 63 U/L (ref 33–130)
Bilirubin, Direct: 0.1 mg/dL (ref 0.0–0.2)
Globulin: 2.4 g/dL (calc) (ref 1.9–3.7)
Indirect Bilirubin: 0.3 mg/dL (calc) (ref 0.2–1.2)
Total Bilirubin: 0.4 mg/dL (ref 0.2–1.2)
Total Protein: 6.5 g/dL (ref 6.1–8.1)

## 2017-05-05 LAB — TSH: TSH: 9.23 m[IU]/L — AB (ref 0.40–4.50)

## 2017-05-05 LAB — MAGNESIUM: MAGNESIUM: 1.7 mg/dL (ref 1.5–2.5)

## 2017-05-05 LAB — HEMOGLOBIN A1C
Hgb A1c MFr Bld: 5.3 % of total Hgb (ref <5.7)
Mean Plasma Glucose: 105 (calc)
eAG (mmol/L): 5.8 (calc)

## 2017-05-05 LAB — BASIC METABOLIC PANEL WITH GFR
BUN: 11 mg/dL (ref 7–25)
CALCIUM: 9.3 mg/dL (ref 8.6–10.4)
CHLORIDE: 102 mmol/L (ref 98–110)
CO2: 29 mmol/L (ref 20–32)
Creat: 0.62 mg/dL (ref 0.50–1.05)
GFR, Est African American: 117 mL/min/{1.73_m2} (ref >=60)
GFR, Est Non African American: 101 mL/min/{1.73_m2} (ref >=60)
Glucose, Bld: 82 mg/dL (ref 65–139)
Potassium: 4.1 mmol/L (ref 3.5–5.3)
Sodium: 137 mmol/L (ref 135–146)

## 2017-05-06 ENCOUNTER — Ambulatory Visit
Admission: RE | Admit: 2017-05-06 | Discharge: 2017-05-06 | Disposition: A | Discharge status: Home / Self Care | Payer: Medicare Other | Source: Ambulatory Visit | Attending: Neurological Surgery | Admitting: Neurological Surgery

## 2017-05-06 DIAGNOSIS — M545 Low back pain: Secondary | ICD-10-CM | POA: Diagnosis not present

## 2017-05-06 DIAGNOSIS — M4727 Other spondylosis with radiculopathy, lumbosacral region: Secondary | ICD-10-CM

## 2017-05-15 ENCOUNTER — Ambulatory Visit
Admission: RE | Admit: 2017-05-15 | Discharge: 2017-05-15 | Disposition: A | Discharge status: Home / Self Care | Payer: Medicare Other | Source: Ambulatory Visit | Attending: Neurological Surgery | Admitting: Neurological Surgery

## 2017-05-15 DIAGNOSIS — M4722 Other spondylosis with radiculopathy, cervical region: Secondary | ICD-10-CM

## 2017-05-15 DIAGNOSIS — M4727 Other spondylosis with radiculopathy, lumbosacral region: Secondary | ICD-10-CM

## 2017-05-15 DIAGNOSIS — M4802 Spinal stenosis, cervical region: Secondary | ICD-10-CM | POA: Diagnosis not present

## 2017-05-15 DIAGNOSIS — M48061 Spinal stenosis, lumbar region without neurogenic claudication: Secondary | ICD-10-CM | POA: Diagnosis not present

## 2017-05-15 MED ORDER — GADOBENATE DIMEGLUMINE 529 MG/ML IV SOLN
20.0000 mL | Freq: Once | INTRAVENOUS | Status: AC | PRN
  Administered 2017-05-15: 20 mL via INTRAVENOUS

## 2017-05-18 ENCOUNTER — Other Ambulatory Visit: Payer: Self-pay | Admitting: Neurological Surgery

## 2017-05-18 DIAGNOSIS — M4722 Other spondylosis with radiculopathy, cervical region: Secondary | ICD-10-CM | POA: Diagnosis not present

## 2017-05-25 NOTE — Pre-Procedure Instructions (Signed)
Autumn Frye  05/25/2017      [facility] - [place], [place] AT [place] [address] [place] [address] Phone: [phone] Fax: [phone]    Your procedure is scheduled on Monday September 24.  Report to [facility] Admitting at 10:00 A.M.  Call this number if you have problems the morning of surgery:  [phone]   Remember:  Do not eat food or drink liquids after midnight.  Take these medicines the morning of surgery with A SIP OF WATER: levothyroxine (synthroid), albuterol if needed (please bring inhaler to hospital with you), eye drops  7 days prior to surgery STOP taking any Aspirin, Aleve, Naproxen, Ibuprofen, Motrin, Advil, Goody's, BC's, all herbal medications, fish oil, and all vitamins    Do not wear jewelry, make-up or nail polish.  Do not wear lotions, powders, or perfumes, or deoderant.  Do not shave 48 hours prior to surgery.  Men may shave face and neck.  Do not bring valuables to the hospital.  [facility] is not responsible for any belongings or valuables.  Contacts, dentures or bridgework may not be worn into surgery.  Leave your suitcase in the car.  After surgery it may be brought to your room.  For patients admitted to the hospital, discharge time will be determined by your treatment team.  Patients discharged the day of surgery will not be allowed to drive home.   Special instructions:    [place]- Preparing For Surgery  Before surgery, you can play an important role. Because skin is not sterile, your skin needs to be as free of germs as possible. You can reduce the number of germs on your skin by washing with CHG (chlorahexidine gluconate) Soap before surgery.  CHG is an antiseptic cleaner which kills germs and bonds with the skin to continue killing germs even after washing.  Please do not use if you have an allergy to CHG or antibacterial soaps. If your skin  becomes reddened/irritated stop using the CHG.  Do not shave (including legs and underarms) for at least 48 hours prior to first CHG shower. It is OK to shave your face.  Please follow these instructions carefully.   1. Shower the NIGHT BEFORE SURGERY and the MORNING OF SURGERY with CHG.   2. If you chose to wash your hair, wash your hair first as usual with your normal shampoo.  3. After you shampoo, rinse your hair and body thoroughly to remove the shampoo.  4. Use CHG as you would any other liquid soap. You can apply CHG directly to the skin and wash gently with a scrungie or a clean washcloth.   5. Apply the CHG Soap to your body ONLY FROM THE NECK DOWN.  Do not use on open wounds or open sores. Avoid contact with your eyes, ears, mouth and genitals (private parts). Wash genitals (private parts) with your normal soap.  6. Wash thoroughly, paying special attention to the area where your surgery will be performed.  7. Thoroughly rinse your body with warm water from the neck down.  8. DO NOT shower/wash with your normal soap after using and rinsing off the CHG Soap.  9. Pat yourself dry with a CLEAN TOWEL.   10. Wear CLEAN PAJAMAS   11. Place CLEAN SHEETS on your bed the night of your first shower and DO NOT SLEEP WITH PETS.    Day of Surgery:  Do not apply any deodorants/lotions. Please wear clean clothes to the hospital/surgery center.      Please read over the following fact sheets that you were given. MRSA Information

## 2017-05-26 ENCOUNTER — Encounter (HOSPITAL_COMMUNITY)
Admission: RE | Admit: 2017-05-26 | Discharge: 2017-05-26 | Disposition: A | Discharge status: Home / Self Care | Payer: Medicare Other | Source: Ambulatory Visit | Attending: Neurological Surgery | Admitting: Neurological Surgery

## 2017-05-26 ENCOUNTER — Encounter (HOSPITAL_COMMUNITY): Payer: Self-pay

## 2017-05-26 DIAGNOSIS — Z01812 Encounter for preprocedural laboratory examination: Secondary | ICD-10-CM | POA: Insufficient documentation

## 2017-05-26 DIAGNOSIS — M4722 Other spondylosis with radiculopathy, cervical region: Secondary | ICD-10-CM | POA: Insufficient documentation

## 2017-05-26 HISTORY — DX: Fibromyalgia: M79.7

## 2017-05-26 HISTORY — DX: Family history of other specified conditions: Z84.89

## 2017-05-26 HISTORY — DX: Inflammatory liver disease, unspecified: K75.9

## 2017-05-26 LAB — COMPREHENSIVE METABOLIC PANEL
ALK PHOS: 57 U/L (ref 38–126)
ALT: 32 U/L (ref 14–54)
ANION GAP: 6 (ref 5–15)
AST: 29 U/L (ref 15–41)
Albumin: 3.8 g/dL (ref 3.5–5.0)
BILIRUBIN TOTAL: 0.8 mg/dL (ref 0.3–1.2)
BUN: 12 mg/dL (ref 6–20)
CALCIUM: 8.9 mg/dL (ref 8.9–10.3)
CO2: 26 mmol/L (ref 22–32)
Chloride: 103 mmol/L (ref 101–111)
Creatinine, Ser: 0.63 mg/dL (ref 0.44–1.00)
GLUCOSE: 93 mg/dL (ref 65–99)
Potassium: 4.2 mmol/L (ref 3.5–5.1)
Sodium: 135 mmol/L (ref 135–145)
TOTAL PROTEIN: 6.6 g/dL (ref 6.5–8.1)

## 2017-05-26 LAB — CBC
HCT: 39.7 % (ref 36.0–46.0)
HEMOGLOBIN: 13.2 g/dL (ref 12.0–15.0)
MCH: 28.8 pg (ref 26.0–34.0)
MCHC: 33.2 g/dL (ref 30.0–36.0)
MCV: 86.7 fL (ref 78.0–100.0)
Platelets: 283 10*3/uL (ref 150–400)
RBC: 4.58 MIL/uL (ref 3.87–5.11)
RDW: 14.5 % (ref 11.5–15.5)
WBC: 6 10*3/uL (ref 4.0–10.5)

## 2017-05-26 LAB — TYPE AND SCREEN
ABO/RH(D): A POS
ANTIBODY SCREEN: NEGATIVE

## 2017-05-26 LAB — SURGICAL PCR SCREEN
MRSA, PCR: NEGATIVE
Staphylococcus aureus: NEGATIVE

## 2017-05-26 NOTE — Pre-Procedure Instructions (Addendum)
Autumn Frye  05/26/2017      [facility] - [place], [place] AT [place] [address] [place] [address] Phone: [phone] Fax: [phone]    Your procedure is scheduled on Monday September 24.  Report to [facility] Admitting at 10:00 A.M.  Call this number if you have problems the morning of surgery:  [phone]   Remember:  Do not eat food or drink liquids after midnight.  Take these medicines the morning of surgery with A SIP OF WATER: levothyroxine (synthroid), albuterol if needed (please bring inhaler to hospital with you), eye drops  7 days prior to surgery STOP taking any Aspirin, Aleve, Naproxen, Ibuprofen, Motrin, Advil, Goody's, BC's, all herbal medications, fish oil, and all vitamins    Do not wear jewelry, make-up or nail polish.  Do not wear lotions, powders, or perfumes, or deoderant.  Do not shave 48 hours prior to surgery.  Men may shave face and neck.  Do not bring valuables to the hospital.  [facility] is not responsible for any belongings or valuables.  Contacts, dentures or bridgework may not be worn into surgery.  Leave your suitcase in the car.  After surgery it may be brought to your room.  For patients admitted to the hospital, discharge time will be determined by your treatment team.  Patients discharged the day of surgery will not be allowed to drive home.   Special instructions:    - Preparing For Surgery  Before surgery, you can play an important role. Because skin is not sterile, your skin needs to be as free of germs as possible. You can reduce the number of germs on your skin by washing with CHG (chlorahexidine gluconate) Soap before surgery.  CHG is an antiseptic cleaner which kills germs and bonds with the skin to continue killing germs even after washing.  Please do not use if you have an allergy to CHG or antibacterial soaps. If your skin  becomes reddened/irritated stop using the CHG.  Do not shave (including legs and underarms) for at least 48 hours prior to first CHG shower. It is OK to shave your face.  Please follow these instructions carefully.   1. Shower the NIGHT BEFORE SURGERY and the MORNING OF SURGERY with CHG.   2. If you chose to wash your hair, wash your hair first as usual with your normal shampoo.  3. After you shampoo, rinse your hair and body thoroughly to remove the shampoo.  4. Use CHG as you would any other liquid soap. You can apply CHG directly to the skin and wash gently with a scrungie or a clean washcloth.   5. Apply the CHG Soap to your body ONLY FROM THE NECK DOWN.  Do not use on open wounds or open sores. Avoid contact with your eyes, ears, mouth and genitals (private parts). Wash genitals (private parts) with your normal soap.  6. Wash thoroughly, paying special attention to the area where your surgery will be performed.  7. Thoroughly rinse your body with warm water from the neck down.  8. DO NOT shower/wash with your normal soap after using and rinsing off the CHG Soap.  9. Pat yourself dry with a CLEAN TOWEL.   10. Wear CLEAN PAJAMAS   11. Place CLEAN SHEETS on your bed the night of your first shower and DO NOT SLEEP WITH PETS.    Day of Surgery:  Do not apply any deodorants/lotions. Please wear clean clothes to the hospital/surgery center.      Please read over the following fact sheets that you were given. MRSA Information

## 2017-05-26 NOTE — Progress Notes (Signed)
PCP: Dr. Magnus Sinning  Rheum: Dr.Syed.   Pt. Will stop Butrans 3 days prior to surgery.  Last dose of Orencia was 05/11/17 per pt.

## 2017-05-27 DIAGNOSIS — Z85850 Personal history of malignant neoplasm of thyroid: Secondary | ICD-10-CM | POA: Diagnosis not present

## 2017-05-27 DIAGNOSIS — Z87891 Personal history of nicotine dependence: Secondary | ICD-10-CM | POA: Diagnosis not present

## 2017-05-27 DIAGNOSIS — K219 Gastro-esophageal reflux disease without esophagitis: Secondary | ICD-10-CM | POA: Diagnosis not present

## 2017-05-27 DIAGNOSIS — E890 Postprocedural hypothyroidism: Secondary | ICD-10-CM | POA: Diagnosis not present

## 2017-05-30 ENCOUNTER — Ambulatory Visit (HOSPITAL_COMMUNITY)
Admission: RE | Admit: 2017-05-30 | Discharge: 2017-05-31 | Disposition: A | Discharge status: Home / Self Care | Payer: Medicare Other | Source: Ambulatory Visit | Attending: Neurological Surgery | Admitting: Neurological Surgery

## 2017-05-30 ENCOUNTER — Ambulatory Visit (HOSPITAL_COMMUNITY): Payer: Medicare Other | Admitting: Emergency Medicine

## 2017-05-30 ENCOUNTER — Encounter (HOSPITAL_COMMUNITY): Payer: Self-pay | Admitting: Certified Registered Nurse Anesthetist

## 2017-05-30 ENCOUNTER — Encounter (HOSPITAL_COMMUNITY)
Admission: RE | Disposition: A | Discharge status: Home / Self Care | Payer: Self-pay | Source: Ambulatory Visit | Attending: Neurological Surgery

## 2017-05-30 ENCOUNTER — Ambulatory Visit (HOSPITAL_COMMUNITY): Payer: Medicare Other | Admitting: Certified Registered Nurse Anesthetist

## 2017-05-30 ENCOUNTER — Ambulatory Visit (HOSPITAL_COMMUNITY): Payer: Medicare Other

## 2017-05-30 DIAGNOSIS — Z79891 Long term (current) use of opiate analgesic: Secondary | ICD-10-CM | POA: Diagnosis not present

## 2017-05-30 DIAGNOSIS — Z79899 Other long term (current) drug therapy: Secondary | ICD-10-CM | POA: Diagnosis not present

## 2017-05-30 DIAGNOSIS — E890 Postprocedural hypothyroidism: Secondary | ICD-10-CM | POA: Insufficient documentation

## 2017-05-30 DIAGNOSIS — K219 Gastro-esophageal reflux disease without esophagitis: Secondary | ICD-10-CM | POA: Diagnosis not present

## 2017-05-30 DIAGNOSIS — M4802 Spinal stenosis, cervical region: Secondary | ICD-10-CM | POA: Insufficient documentation

## 2017-05-30 DIAGNOSIS — Z96653 Presence of artificial knee joint, bilateral: Secondary | ICD-10-CM | POA: Diagnosis not present

## 2017-05-30 DIAGNOSIS — M4722 Other spondylosis with radiculopathy, cervical region: Secondary | ICD-10-CM | POA: Diagnosis present

## 2017-05-30 DIAGNOSIS — M4322 Fusion of spine, cervical region: Secondary | ICD-10-CM | POA: Diagnosis not present

## 2017-05-30 DIAGNOSIS — E785 Hyperlipidemia, unspecified: Secondary | ICD-10-CM | POA: Insufficient documentation

## 2017-05-30 DIAGNOSIS — Z87891 Personal history of nicotine dependence: Secondary | ICD-10-CM | POA: Insufficient documentation

## 2017-05-30 DIAGNOSIS — M542 Cervicalgia: Secondary | ICD-10-CM | POA: Diagnosis present

## 2017-05-30 DIAGNOSIS — Z7982 Long term (current) use of aspirin: Secondary | ICD-10-CM | POA: Insufficient documentation

## 2017-05-30 DIAGNOSIS — M069 Rheumatoid arthritis, unspecified: Secondary | ICD-10-CM | POA: Diagnosis not present

## 2017-05-30 DIAGNOSIS — Z419 Encounter for procedure for purposes other than remedying health state, unspecified: Secondary | ICD-10-CM

## 2017-05-30 DIAGNOSIS — I10 Essential (primary) hypertension: Secondary | ICD-10-CM | POA: Insufficient documentation

## 2017-05-30 HISTORY — PX: ANTERIOR CERVICAL DECOMP/DISCECTOMY FUSION: SHX1161

## 2017-05-30 SURGERY — ANTERIOR CERVICAL DECOMPRESSION/DISCECTOMY FUSION 2 LEVELS
Anesthesia: General | Site: Neck

## 2017-05-30 MED ORDER — FENTANYL CITRATE (PF) 250 MCG/5ML IJ SOLN
INTRAMUSCULAR | Status: AC
  Filled 2017-05-30: qty 5

## 2017-05-30 MED ORDER — PHENYLEPHRINE HCL 10 MG/ML IJ SOLN
INTRAVENOUS | Status: DC | PRN
  Administered 2017-05-30: 15 ug/min via INTRAVENOUS

## 2017-05-30 MED ORDER — ONDANSETRON HCL 4 MG/2ML IJ SOLN
4.0000 mg | Freq: Once | INTRAMUSCULAR | Status: DC | PRN
Start: 2017-05-30 — End: 2017-05-30

## 2017-05-30 MED ORDER — LEVOTHYROXINE SODIUM 100 MCG PO TABS
150.0000 ug | ORAL_TABLET | Freq: Every day | ORAL | Status: DC
  Administered 2017-05-31: 150 ug via ORAL
  Filled 2017-05-30: qty 2

## 2017-05-30 MED ORDER — ONDANSETRON HCL 4 MG/2ML IJ SOLN
INTRAMUSCULAR | Status: DC | PRN
  Administered 2017-05-30: 4 mg via INTRAVENOUS

## 2017-05-30 MED ORDER — HYDROMORPHONE HCL 1 MG/ML IJ SOLN
INTRAMUSCULAR | Status: AC
  Filled 2017-05-30: qty 1

## 2017-05-30 MED ORDER — SENNA 8.6 MG PO TABS
1.0000 | ORAL_TABLET | Freq: Two times a day (BID) | ORAL | Status: DC
  Administered 2017-05-30 (×2): 8.6 mg via ORAL
  Filled 2017-05-30 (×2): qty 1

## 2017-05-30 MED ORDER — GELATIN ABSORBABLE MT POWD
OROMUCOSAL | Status: DC | PRN
  Administered 2017-05-30: 5 mL via TOPICAL

## 2017-05-30 MED ORDER — CHLORHEXIDINE GLUCONATE CLOTH 2 % EX PADS: 6.0000 | MEDICATED_PAD | Freq: Once | CUTANEOUS | Status: DC

## 2017-05-30 MED ORDER — DIAZEPAM 5 MG PO TABS
5.0000 mg | ORAL_TABLET | Freq: Four times a day (QID) | ORAL | Status: DC | PRN
  Administered 2017-05-31: 5 mg via ORAL
  Filled 2017-05-30: qty 1

## 2017-05-30 MED ORDER — FE FUMARATE-B12-VIT C-FA-IFC PO CAPS
1.0000 | ORAL_CAPSULE | Freq: Every day | ORAL | Status: DC
  Filled 2017-05-30: qty 1

## 2017-05-30 MED ORDER — ASPIRIN EC 81 MG PO TBEC
81.0000 mg | DELAYED_RELEASE_TABLET | Freq: Every day | ORAL | Status: DC
  Administered 2017-05-30: 81 mg via ORAL
  Filled 2017-05-30: qty 1

## 2017-05-30 MED ORDER — FENTANYL CITRATE (PF) 100 MCG/2ML IJ SOLN
INTRAMUSCULAR | Status: DC | PRN
  Administered 2017-05-30: 100 ug via INTRAVENOUS
  Administered 2017-05-30 (×4): 50 ug via INTRAVENOUS

## 2017-05-30 MED ORDER — DOCUSATE SODIUM 100 MG PO CAPS
100.0000 mg | ORAL_CAPSULE | Freq: Two times a day (BID) | ORAL | Status: DC
  Administered 2017-05-30 (×2): 100 mg via ORAL
  Filled 2017-05-30 (×2): qty 1

## 2017-05-30 MED ORDER — THROMBIN 20000 UNITS EX SOLR
CUTANEOUS | Status: DC | PRN
  Administered 2017-05-30: 20 mL via TOPICAL

## 2017-05-30 MED ORDER — ROCURONIUM BROMIDE 10 MG/ML (PF) SYRINGE
PREFILLED_SYRINGE | INTRAVENOUS | Status: DC | PRN
  Administered 2017-05-30: 50 mg via INTRAVENOUS
  Administered 2017-05-30 (×2): 20 mg via INTRAVENOUS

## 2017-05-30 MED ORDER — EPHEDRINE SULFATE-NACL 50-0.9 MG/10ML-% IV SOSY
PREFILLED_SYRINGE | INTRAVENOUS | Status: DC | PRN
  Administered 2017-05-30 (×2): 5 mg via INTRAVENOUS

## 2017-05-30 MED ORDER — ONDANSETRON HCL 4 MG PO TABS: 4.0000 mg | ORAL_TABLET | Freq: Four times a day (QID) | ORAL | Status: DC | PRN

## 2017-05-30 MED ORDER — THROMBIN 5000 UNITS EX SOLR
CUTANEOUS | Status: AC
  Filled 2017-05-30: qty 5000

## 2017-05-30 MED ORDER — PHENOL 1.4 % MT LIQD: 1.0000 | OROMUCOSAL | Status: DC | PRN

## 2017-05-30 MED ORDER — LIDOCAINE 2% (20 MG/ML) 5 ML SYRINGE
INTRAMUSCULAR | Status: AC
  Filled 2017-05-30: qty 5

## 2017-05-30 MED ORDER — METHOCARBAMOL 750 MG PO TABS
750.0000 mg | ORAL_TABLET | Freq: Four times a day (QID) | ORAL | Status: DC
  Administered 2017-05-30 (×3): 750 mg via ORAL
  Filled 2017-05-30 (×3): qty 1

## 2017-05-30 MED ORDER — BUPRENORPHINE 10 MCG/HR TD PTWK
10.0000 ug | MEDICATED_PATCH | TRANSDERMAL | Status: DC
  Filled 2017-05-30: qty 1

## 2017-05-30 MED ORDER — MENTHOL 3 MG MT LOZG
1.0000 | LOZENGE | OROMUCOSAL | Status: DC | PRN
  Administered 2017-05-30: 3 mg via ORAL
  Filled 2017-05-30: qty 9

## 2017-05-30 MED ORDER — ALBUTEROL SULFATE (2.5 MG/3ML) 0.083% IN NEBU: 2.5000 mg | INHALATION_SOLUTION | Freq: Four times a day (QID) | RESPIRATORY_TRACT | Status: DC | PRN

## 2017-05-30 MED ORDER — GABAPENTIN 300 MG PO CAPS
300.0000 mg | ORAL_CAPSULE | Freq: Three times a day (TID) | ORAL | Status: DC
  Filled 2017-05-30 (×2): qty 1

## 2017-05-30 MED ORDER — HYDROMORPHONE HCL 1 MG/ML IJ SOLN
0.2500 mg | INTRAMUSCULAR | Status: DC | PRN
  Administered 2017-05-30 (×3): 0.5 mg via INTRAVENOUS

## 2017-05-30 MED ORDER — PANTOPRAZOLE SODIUM 40 MG IV SOLR
40.0000 mg | Freq: Every day | INTRAVENOUS | Status: DC
  Administered 2017-05-30: 40 mg via INTRAVENOUS
  Filled 2017-05-30: qty 40

## 2017-05-30 MED ORDER — CYCLOSPORINE 0.05 % OP EMUL
1.0000 [drp] | Freq: Every day | OPHTHALMIC | Status: DC
  Administered 2017-05-30: 1 [drp] via OPHTHALMIC
  Filled 2017-05-30 (×2): qty 1

## 2017-05-30 MED ORDER — ROCURONIUM BROMIDE 10 MG/ML (PF) SYRINGE
PREFILLED_SYRINGE | INTRAVENOUS | Status: AC
  Filled 2017-05-30: qty 5

## 2017-05-30 MED ORDER — CELECOXIB 200 MG PO CAPS
200.0000 mg | ORAL_CAPSULE | Freq: Two times a day (BID) | ORAL | Status: DC
  Administered 2017-05-30 (×2): 200 mg via ORAL
  Filled 2017-05-30 (×2): qty 1

## 2017-05-30 MED ORDER — ONDANSETRON HCL 4 MG/2ML IJ SOLN: 4.0000 mg | Freq: Four times a day (QID) | INTRAMUSCULAR | Status: DC | PRN

## 2017-05-30 MED ORDER — LIDOCAINE-EPINEPHRINE 2 %-1:100000 IJ SOLN
INTRAMUSCULAR | Status: DC | PRN
  Administered 2017-05-30: 5 mL

## 2017-05-30 MED ORDER — EPHEDRINE SULFATE 50 MG/ML IJ SOLN
INTRAMUSCULAR | Status: AC
  Filled 2017-05-30: qty 1

## 2017-05-30 MED ORDER — SODIUM CHLORIDE 0.9 % IJ SOLN
INTRAMUSCULAR | Status: AC
  Filled 2017-05-30: qty 10

## 2017-05-30 MED ORDER — PROPOFOL 10 MG/ML IV BOLUS
INTRAVENOUS | Status: DC | PRN
  Administered 2017-05-30: 120 mg via INTRAVENOUS

## 2017-05-30 MED ORDER — SUGAMMADEX SODIUM 200 MG/2ML IV SOLN
INTRAVENOUS | Status: DC | PRN
  Administered 2017-05-30: 200 mg via INTRAVENOUS

## 2017-05-30 MED ORDER — DEXAMETHASONE SODIUM PHOSPHATE 10 MG/ML IJ SOLN
INTRAMUSCULAR | Status: AC
  Filled 2017-05-30: qty 1

## 2017-05-30 MED ORDER — FUROSEMIDE 40 MG PO TABS: 40.0000 mg | ORAL_TABLET | Freq: Three times a day (TID) | ORAL | Status: DC | PRN

## 2017-05-30 MED ORDER — DEXAMETHASONE SODIUM PHOSPHATE 4 MG/ML IJ SOLN
2.0000 mg | Freq: Three times a day (TID) | INTRAMUSCULAR | Status: AC
  Administered 2017-05-30 – 2017-05-31 (×3): 2 mg via INTRAVENOUS
  Filled 2017-05-30 (×3): qty 1

## 2017-05-30 MED ORDER — BUPIVACAINE-EPINEPHRINE (PF) 0.5% -1:200000 IJ SOLN
INTRAMUSCULAR | Status: DC | PRN
  Administered 2017-05-30: 5 mL

## 2017-05-30 MED ORDER — ACETAMINOPHEN 500 MG PO TABS
1000.0000 mg | ORAL_TABLET | Freq: Four times a day (QID) | ORAL | Status: DC
  Administered 2017-05-30 – 2017-05-31 (×4): 1000 mg via ORAL
  Filled 2017-05-30 (×4): qty 2

## 2017-05-30 MED ORDER — FLEET ENEMA 7-19 GM/118ML RE ENEM: 1.0000 | ENEMA | Freq: Once | RECTAL | Status: DC | PRN

## 2017-05-30 MED ORDER — THROMBIN 20000 UNITS EX SOLR
CUTANEOUS | Status: AC
  Filled 2017-05-30: qty 20000

## 2017-05-30 MED ORDER — VANCOMYCIN HCL IN DEXTROSE 1-5 GM/200ML-% IV SOLN
1000.0000 mg | Freq: Two times a day (BID) | INTRAVENOUS | Status: DC
  Administered 2017-05-30 – 2017-05-31 (×2): 1000 mg via INTRAVENOUS
  Filled 2017-05-30 (×2): qty 200

## 2017-05-30 MED ORDER — ALBUTEROL SULFATE HFA 108 (90 BASE) MCG/ACT IN AERS: 1.0000 | INHALATION_SPRAY | Freq: Four times a day (QID) | RESPIRATORY_TRACT | Status: DC | PRN

## 2017-05-30 MED ORDER — BISACODYL 10 MG RE SUPP: 10.0000 mg | Freq: Every day | RECTAL | Status: DC | PRN

## 2017-05-30 MED ORDER — 0.9 % SODIUM CHLORIDE (POUR BTL) OPTIME
TOPICAL | Status: DC | PRN
  Administered 2017-05-30: 1000 mL

## 2017-05-30 MED ORDER — LOTEPREDNOL ETABONATE 0.5 % OP SUSP
1.0000 [drp] | Freq: Three times a day (TID) | OPHTHALMIC | Status: DC
  Administered 2017-05-30 (×2): 1 [drp] via OPHTHALMIC
  Filled 2017-05-30: qty 5

## 2017-05-30 MED ORDER — LACTATED RINGERS IV SOLN
INTRAVENOUS | Status: DC | PRN
  Administered 2017-05-30 (×2): via INTRAVENOUS

## 2017-05-30 MED ORDER — BACITRACIN 50000 UNITS IM SOLR
INTRAMUSCULAR | Status: DC | PRN
  Administered 2017-05-30: 500 mL

## 2017-05-30 MED ORDER — PROPOFOL 10 MG/ML IV BOLUS
INTRAVENOUS | Status: AC
  Filled 2017-05-30: qty 40

## 2017-05-30 MED ORDER — PHENYLEPHRINE 40 MCG/ML (10ML) SYRINGE FOR IV PUSH (FOR BLOOD PRESSURE SUPPORT)
PREFILLED_SYRINGE | INTRAVENOUS | Status: DC | PRN
  Administered 2017-05-30 (×2): 40 ug via INTRAVENOUS
  Administered 2017-05-30 (×2): 80 ug via INTRAVENOUS

## 2017-05-30 MED ORDER — VITAMIN K2 100 MCG PO CAPS: 1.0000 | ORAL_CAPSULE | Freq: Every day | ORAL | Status: DC

## 2017-05-30 MED ORDER — OXYCODONE HCL 5 MG PO TABS
5.0000 mg | ORAL_TABLET | ORAL | Status: DC | PRN
  Administered 2017-05-30 – 2017-05-31 (×6): 10 mg via ORAL
  Filled 2017-05-30 (×6): qty 2

## 2017-05-30 MED ORDER — AVENOVA 0.01 % EX SOLN: 1.0000 "application " | Freq: Two times a day (BID) | CUTANEOUS | Status: DC

## 2017-05-30 MED ORDER — AMITRIPTYLINE HCL 75 MG PO TABS
75.0000 mg | ORAL_TABLET | Freq: Every day | ORAL | Status: DC
  Administered 2017-05-30: 75 mg via ORAL
  Filled 2017-05-30 (×2): qty 1

## 2017-05-30 MED ORDER — ONDANSETRON HCL 4 MG/2ML IJ SOLN
INTRAMUSCULAR | Status: AC
  Filled 2017-05-30: qty 2

## 2017-05-30 MED ORDER — MIDAZOLAM HCL 2 MG/2ML IJ SOLN
INTRAMUSCULAR | Status: AC
  Filled 2017-05-30: qty 2

## 2017-05-30 MED ORDER — PHENYLEPHRINE 40 MCG/ML (10ML) SYRINGE FOR IV PUSH (FOR BLOOD PRESSURE SUPPORT)
PREFILLED_SYRINGE | INTRAVENOUS | Status: AC
  Filled 2017-05-30: qty 10

## 2017-05-30 MED ORDER — SODIUM CHLORIDE 0.9 % IV SOLN: 250.0000 mL | INTRAVENOUS | Status: DC

## 2017-05-30 MED ORDER — VITAMIN D3 25 MCG (1000 UNIT) PO TABS
5000.0000 [IU] | ORAL_TABLET | Freq: Every day | ORAL | Status: DC
  Filled 2017-05-30: qty 5

## 2017-05-30 MED ORDER — SUGAMMADEX SODIUM 200 MG/2ML IV SOLN
INTRAVENOUS | Status: AC
  Filled 2017-05-30: qty 2

## 2017-05-30 MED ORDER — MEPERIDINE HCL 25 MG/ML IJ SOLN: 6.2500 mg | INTRAMUSCULAR | Status: DC | PRN

## 2017-05-30 MED ORDER — DEXAMETHASONE SODIUM PHOSPHATE 10 MG/ML IJ SOLN
INTRAMUSCULAR | Status: DC | PRN
  Administered 2017-05-30: 10 mg via INTRAVENOUS

## 2017-05-30 MED ORDER — MIDAZOLAM HCL 5 MG/5ML IJ SOLN
INTRAMUSCULAR | Status: DC | PRN
  Administered 2017-05-30: 2 mg via INTRAVENOUS

## 2017-05-30 MED ORDER — SODIUM CHLORIDE 0.9% FLUSH: 3.0000 mL | INTRAVENOUS | Status: DC | PRN

## 2017-05-30 MED ORDER — MAGNESIUM OXIDE 400 (241.3 MG) MG PO TABS: 400.0000 mg | ORAL_TABLET | Freq: Every day | ORAL | Status: DC

## 2017-05-30 MED ORDER — LIDOCAINE 2% (20 MG/ML) 5 ML SYRINGE
INTRAMUSCULAR | Status: DC | PRN
  Administered 2017-05-30: 100 mg via INTRAVENOUS

## 2017-05-30 MED ORDER — LIDOCAINE-EPINEPHRINE 2 %-1:100000 IJ SOLN
INTRAMUSCULAR | Status: AC
  Filled 2017-05-30: qty 1

## 2017-05-30 MED ORDER — BUPIVACAINE-EPINEPHRINE (PF) 0.5% -1:200000 IJ SOLN
INTRAMUSCULAR | Status: AC
  Filled 2017-05-30: qty 30

## 2017-05-30 MED ORDER — ATORVASTATIN CALCIUM 20 MG PO TABS: 40.0000 mg | ORAL_TABLET | ORAL | Status: DC

## 2017-05-30 MED ORDER — ZOLPIDEM TARTRATE 5 MG PO TABS: 5.0000 mg | ORAL_TABLET | Freq: Every evening | ORAL | Status: DC | PRN

## 2017-05-30 MED ORDER — POLYVINYL ALCOHOL 1.4 % OP SOLN
1.0000 [drp] | Freq: Four times a day (QID) | OPHTHALMIC | Status: DC
  Administered 2017-05-30 (×2): 1 [drp] via OPHTHALMIC
  Filled 2017-05-30: qty 15

## 2017-05-30 MED ORDER — VANCOMYCIN HCL IN DEXTROSE 1-5 GM/200ML-% IV SOLN
1000.0000 mg | INTRAVENOUS | Status: AC
  Administered 2017-05-30: 1000 mg via INTRAVENOUS
  Filled 2017-05-30: qty 200

## 2017-05-30 MED ORDER — SODIUM CHLORIDE 0.9% FLUSH: 3.0000 mL | Freq: Two times a day (BID) | INTRAVENOUS | Status: DC

## 2017-05-30 SURGICAL SUPPLY — 75 items
ADH SKN CLS APL DERMABOND .7 (GAUZE/BANDAGES/DRESSINGS) ×1
BIT DRILL INVIZIA (BIT) IMPLANT
BLADE ULTRA TIP 2M (BLADE) IMPLANT
BUR MATCHSTICK NEURO 3.0 LAGG (BURR) ×3 IMPLANT
CAGE PEEK VISTA-S CERV 11X14X5 (Cage) ×4 IMPLANT
CANISTER SUCT 3000ML PPV (MISCELLANEOUS) ×3 IMPLANT
CARTRIDGE OIL MAESTRO DRILL (MISCELLANEOUS) ×1 IMPLANT
CHLORAPREP W/TINT 26ML (MISCELLANEOUS) ×3 IMPLANT
DECANTER SPIKE VIAL GLASS SM (MISCELLANEOUS) ×3 IMPLANT
DERMABOND ADVANCED (GAUZE/BANDAGES/DRESSINGS) ×2
DERMABOND ADVANCED .7 DNX12 (GAUZE/BANDAGES/DRESSINGS) ×1 IMPLANT
DIFFUSER DRILL AIR PNEUMATIC (MISCELLANEOUS) ×3 IMPLANT
DRAPE C-ARM 42X72 X-RAY (DRAPES) ×6 IMPLANT
DRAPE HALF SHEET 40X57 (DRAPES) IMPLANT
DRAPE INCISE IOBAN 66X45 STRL (DRAPES) ×2 IMPLANT
DRAPE LAPAROTOMY 100X72 PEDS (DRAPES) ×3 IMPLANT
DRAPE MICROSCOPE LEICA (MISCELLANEOUS) ×3 IMPLANT
DRAPE POUCH INSTRU U-SHP 10X18 (DRAPES) ×3 IMPLANT
DRAPE SHEET LG 3/4 BI-LAMINATE (DRAPES) ×3 IMPLANT
DRILL BIT INVIZIA (BIT) ×3
DRSG OPSITE POSTOP 3X4 (GAUZE/BANDAGES/DRESSINGS) ×2 IMPLANT
ELECT COATED BLADE 2.86 ST (ELECTRODE) ×3 IMPLANT
ELECT REM PT RETURN 9FT ADLT (ELECTROSURGICAL) ×3
ELECTRODE REM PT RTRN 9FT ADLT (ELECTROSURGICAL) ×1 IMPLANT
EVACUATOR 1/8 PVC DRAIN (DRAIN) ×2 IMPLANT
GAUZE SPONGE 4X4 12PLY STRL (GAUZE/BANDAGES/DRESSINGS) IMPLANT
GAUZE SPONGE 4X4 16PLY XRAY LF (GAUZE/BANDAGES/DRESSINGS) IMPLANT
GLOVE BIO SURGEON STRL SZ7 (GLOVE) IMPLANT
GLOVE BIO SURGEON STRL SZ8 (GLOVE) ×2 IMPLANT
GLOVE BIO SURGEON STRL SZ8.5 (GLOVE) ×2 IMPLANT
GLOVE BIOGEL PI IND STRL 7.0 (GLOVE) IMPLANT
GLOVE BIOGEL PI IND STRL 7.5 (GLOVE) ×1 IMPLANT
GLOVE BIOGEL PI INDICATOR 7.0 (GLOVE)
GLOVE BIOGEL PI INDICATOR 7.5 (GLOVE) ×2
GLOVE SS BIOGEL STRL SZ 7.5 (GLOVE) ×2 IMPLANT
GLOVE SUPERSENSE BIOGEL SZ 7.5 (GLOVE) ×8
GOWN STRL REUS W/ TWL LRG LVL3 (GOWN DISPOSABLE) ×1 IMPLANT
GOWN STRL REUS W/ TWL XL LVL3 (GOWN DISPOSABLE) ×1 IMPLANT
GOWN STRL REUS W/TWL LRG LVL3 (GOWN DISPOSABLE) ×6
GOWN STRL REUS W/TWL XL LVL3 (GOWN DISPOSABLE) ×6
HEMOSTAT POWDER KIT SURGIFOAM (HEMOSTASIS) ×3 IMPLANT
KIT BASIN OR (CUSTOM PROCEDURE TRAY) ×3 IMPLANT
KIT ROOM TURNOVER OR (KITS) ×3 IMPLANT
NDL HYPO 21X1.5 SAFETY (NEEDLE) ×1 IMPLANT
NDL SPNL 18GX3.5 QUINCKE PK (NEEDLE) ×1 IMPLANT
NEEDLE HYPO 21X1.5 SAFETY (NEEDLE) ×3 IMPLANT
NEEDLE SPNL 18GX3.5 QUINCKE PK (NEEDLE) ×3 IMPLANT
NS IRRIG 1000ML POUR BTL (IV SOLUTION) ×3 IMPLANT
OIL CARTRIDGE MAESTRO DRILL (MISCELLANEOUS) ×3
PACK LAMINECTOMY NEURO (CUSTOM PROCEDURE TRAY) ×3 IMPLANT
PAD ARMBOARD 7.5X6 YLW CONV (MISCELLANEOUS) ×3 IMPLANT
PATTIES SURGICAL .5X1.5 (GAUZE/BANDAGES/DRESSINGS) ×3 IMPLANT
PIN DISTRACTION 14MM (PIN) IMPLANT
PIN THREADED TEMP FIX INVIZIA (PIN) ×2 IMPLANT
PLATE INVIZIA 2 LEV 38MM (Plate) ×2 IMPLANT
PUTTY BONE GRAFT KIT 2.5ML (Bone Implant) ×2 IMPLANT
RUBBERBAND STERILE (MISCELLANEOUS) ×6 IMPLANT
SCREW 14MM (Screw) ×9 IMPLANT
SCREW BN 14X4.2XST VA NS (Screw) IMPLANT
SCREW FIXED ST 14MM (Screw) ×4 IMPLANT
SCREW SELF DRILL FIXED 14MM (Screw) ×2 IMPLANT
SPONGE INTESTINAL PEANUT (DISPOSABLE) ×3 IMPLANT
SPONGE SURGIFOAM ABS GEL 100 (HEMOSTASIS) ×2 IMPLANT
SPONGE SURGIFOAM ABS GEL SZ50 (HEMOSTASIS) IMPLANT
STAPLER VISISTAT 35W (STAPLE) ×3 IMPLANT
STOCKINETTE 6  STRL (DRAPES) ×2
STOCKINETTE 6 STRL (DRAPES) ×1 IMPLANT
SUT STRATAFIX MNCRL+ 3-0 PS-2 (SUTURE) ×2
SUT STRATAFIX MONOCRYL 3-0 (SUTURE) ×4
SUT VIC AB 3-0 SH 8-18 (SUTURE) ×3 IMPLANT
SUTURE STRATFX MNCRL+ 3-0 PS-2 (SUTURE) ×1 IMPLANT
SYR 30ML LL (SYRINGE) ×3 IMPLANT
TOWEL GREEN STERILE (TOWEL DISPOSABLE) ×3 IMPLANT
TOWEL GREEN STERILE FF (TOWEL DISPOSABLE) ×6 IMPLANT
WATER STERILE IRR 1000ML POUR (IV SOLUTION) ×3 IMPLANT

## 2017-05-30 NOTE — Transfer of Care (Signed)
Immediate Anesthesia Transfer of Care Note  Patient: Autumn Frye  Procedure(s) Performed: Procedure(s): Cervical five-six, Cervical six-seven Anterior discectomy with fusion and plate fixation (N/A)  Patient Location: PACU  Anesthesia Type:General  Level of Consciousness: alert , oriented, drowsy and patient cooperative  Airway & Oxygen Therapy: Patient Spontanous Breathing and Patient connected to nasal cannula oxygen  Post-op Assessment: Report given to RN, Post -op Vital signs reviewed and stable and Patient moving all extremities X 4  Post vital signs: Reviewed and stable  Last Vitals:  Vitals:   05/30/17 0704 05/30/17 1036  BP: (!) 147/73   Pulse: 92   Resp: 20   Temp: 36.9 C 36.7 C  SpO2: 97%     Last Pain:  Vitals:   05/30/17 0704  TempSrc: Oral         Complications: No apparent anesthesia complications

## 2017-05-30 NOTE — Evaluation (Signed)
Physical Therapy Evaluation and Discharge Patient Details Name: Autumn Frye MRN: [id number] DOB: Jul 02, 1961 Today's Date: 05/30/2017   History of Present Illness  Pt is a 56 y/o female who presents s/p C5-C7 ACDF on 05/30/17.  Clinical Impression  Patient evaluated by Physical Therapy with no further acute PT needs identified. All education has been completed and the patient has no further questions. At the time of PT eval pt was able to perform transfers and ambulation with gross mod I and no AD. Pt was educated on precautions and safety with activity progression. See below for any follow-up Physical Therapy or equipment needs. PT is signing off. Thank you for this referral.     Follow Up Recommendations No PT follow up;Supervision for mobility/OOB    Equipment Recommendations  None recommended by PT    Recommendations for Other Services       Precautions / Restrictions Precautions Precautions: Fall;Cervical Precaution Booklet Issued: Yes (comment) Precaution Comments: Reviewed handout and pt was cued for precautions during functional mobility.  Restrictions Weight Bearing Restrictions: No      Mobility  Bed Mobility Overal bed mobility: Modified Independent             General bed mobility comments: VC's for log roll. Pt was able to transition to/from EOB without assist, with HOB flat and rails lowered.  Transfers Overall transfer level: Modified independent Equipment used: None             General transfer comment: Initially reaching for wall upon first stand however pt progressed to mod I by end of session  Ambulation/Gait Ambulation/Gait assistance: Modified independent (Device/Increase time);Supervision Ambulation Distance (Feet): 350 Feet Assistive device: None Gait Pattern/deviations: Step-through pattern;Decreased stride length;Trunk flexed Gait velocity: Decreased Gait velocity interpretation: Below normal speed for age/gender General Gait  Details: VC's for improved posture. Initially pt appears stiff and guarded, but progressed to light supervision to mod I by end of gait training.   Stairs Stairs: Yes Stairs assistance: Min guard Stair Management: No rails;Step to pattern;Forwards Number of Stairs: 2 General stair comments: HHA for min guard support but no actual physical assist required.   Wheelchair Mobility    Modified Rankin (Stroke Patients Only)       Balance Overall balance assessment: Needs assistance Sitting-balance support: Feet supported;No upper extremity supported Sitting balance-Leahy Scale: Fair     Standing balance support: No upper extremity supported;During functional activity Standing balance-Leahy Scale: Fair                               Pertinent Vitals/Pain      Home Living Family/patient expects to be discharged to:: Private residence Living Arrangements: Spouse/significant other Available Help at Discharge: Family;Available 24 hours/day Type of Home: House Home Access: Stairs to enter Entrance Stairs-Rails: None Entrance Stairs-Number of Steps: 2 Home Layout: One level Home Equipment: Cane - single point;Walker - 2 wheels      Prior Function Level of Independence: Independent               Hand Dominance   Dominant Hand: Right    Extremity/Trunk Assessment   Upper Extremity Assessment Upper Extremity Assessment: Defer to OT evaluation    Lower Extremity Assessment Lower Extremity Assessment: Overall WFL for tasks assessed    Cervical / Trunk Assessment Cervical / Trunk Assessment: Other exceptions Cervical / Trunk Exceptions: s/p cervical surgery  Communication   Communication: No difficulties  Cognition Arousal/Alertness:  Awake/alert Behavior During Therapy: WFL for tasks assessed/performed Overall Cognitive Status: Within Functional Limits for tasks assessed                                        General Comments       Exercises     Assessment/Plan    PT Assessment Patent does not need any further PT services  PT Problem List Decreased strength;Decreased range of motion;Decreased activity tolerance;Decreased balance;Decreased mobility;Decreased knowledge of use of DME;Decreased safety awareness;Decreased knowledge of precautions;Pain       PT Treatment Interventions      PT Goals (Current goals can be found in the Care Plan section)  Acute Rehab PT Goals Patient Stated Goal: Home today PT Goal Formulation: All assessment and education complete, DC therapy    Frequency     Barriers to discharge        Co-evaluation               AM-PAC PT "6 Clicks" Daily Activity  Outcome Measure Difficulty turning over in bed (including adjusting bedclothes, sheets and blankets)?: None Difficulty moving from lying on back to sitting on the side of the bed? : None Difficulty sitting down on and standing up from a chair with arms (e.g., wheelchair, bedside commode, etc,.)?: None Help needed moving to and from a bed to chair (including a wheelchair)?: None Help needed walking in hospital room?: None Help needed climbing 3-5 steps with a railing? : A Little 6 Click Score: 23    End of Session Equipment Utilized During Treatment: Gait belt Activity Tolerance: Patient tolerated treatment well Patient left: in bed;with call bell/phone within reach;with SCD's reapplied Nurse Communication: Mobility status PT Visit Diagnosis: Unsteadiness on feet (R26.81);Other symptoms and signs involving the nervous system (R29.898);Pain Pain - part of body:  (middle of shoulder)    Time: 9735-3299 PT Time Calculation (min) (ACUTE ONLY): 28 min   Charges:   PT Evaluation $PT Eval Low Complexity: 1 Low PT Treatments $Gait Training: 8-22 mins   PT G Codes:   PT G-Codes **NOT FOR INPATIENT CLASS** Functional Assessment Tool Used: Clinical judgement Functional Limitation: Mobility: Walking and moving  around Mobility: Walking and Moving Around Current Status (M4268): At least 1 percent but less than 20 percent impaired, limited or restricted Mobility: Walking and Moving Around Goal Status [phone]): At least 1 percent but less than 20 percent impaired, limited or restricted Mobility: Walking and Moving Around Discharge Status [phone]): At least 1 percent but less than 20 percent impaired, limited or restricted    Rolinda Roan, PT, DPT Acute Rehabilitation Services Pager: [place] 05/30/2017, 3:20 PM

## 2017-05-30 NOTE — Anesthesia Procedure Notes (Signed)
Procedure Name: Intubation Date/Time: 05/30/2017 8:00 AM Performed by: Everlean Cherry A Pre-anesthesia Checklist: Patient identified, Emergency Drugs available, Suction available and Patient being monitored Patient Re-evaluated:Patient Re-evaluated prior to induction Oxygen Delivery Method: Circle system utilized Preoxygenation: Pre-oxygenation with 100% oxygen Induction Type: IV induction Ventilation: Mask ventilation without difficulty Laryngoscope Size: Miller and 2 Grade View: Grade II Tube type: Oral Tube size: 7.0 mm Number of attempts: 1 Airway Equipment and Method: Stylet Placement Confirmation: ETT inserted through vocal cords under direct vision,  positive ETCO2 and breath sounds checked- equal and bilateral Secured at: 22 cm Tube secured with: Tape Dental Injury: Teeth and Oropharynx as per pre-operative assessment

## 2017-05-30 NOTE — Evaluation (Signed)
Occupational Therapy Evaluation Patient Details Name: Autumn Frye MRN: [id number] DOB: [birth year] Today's Date: 05/30/2017    History of Present Illness Pt is a 56 y/o female who presents s/p C5-C7 ACDF on 05/30/17.   Clinical Impression   PTA, pt was living with her husband and was independent. Pt currently performing ADLs and functional mobility at supervision level. Provided education on cervical precautions, compensatory techniques, dressing, bathing, and toileting; pt verbalizing and demonstrating understanding. Recommend dc home once medically stable per physician. All acute OT need met and pt's questions answered. Will sign off. Thank you.     Follow Up Recommendations  No OT follow up;Supervision/Assistance - 24 hour    Equipment Recommendations  None recommended by OT    Recommendations for Other Services       Precautions / Restrictions Precautions Precautions: Fall;Cervical Precaution Booklet Issued: Yes (comment) Precaution Comments: Reviewed handout and pt was cued for precautions during ADLs Restrictions Weight Bearing Restrictions: No      Mobility Bed Mobility Overal bed mobility: Modified Independent             General bed mobility comments: VC's for log roll. Pt was able to transition to/from EOB without assist, with HOB flat and rails lowered.  Transfers Overall transfer level: Modified independent Equipment used: None             General transfer comment: Able to perform transfers with increased time    Balance Overall balance assessment: Needs assistance Sitting-balance support: Feet supported;No upper extremity supported Sitting balance-Leahy Scale: Fair     Standing balance support: No upper extremity supported;During functional activity Standing balance-Leahy Scale: Fair                             ADL either performed or assessed with clinical judgement   ADL Overall ADL's : Needs  assistance/impaired Eating/Feeding: Set up;Sitting   Grooming: Oral care;Wash/dry face;Supervision/safety;Set up;Standing Grooming Details (indicate cue type and reason): Min VCs for using compensatory techniques to adhere to cervical precautions Upper Body Bathing: Supervision/ safety;Set up;Standing   Lower Body Bathing: Supervison/ safety;Set up;Sit to/from stand   Upper Body Dressing : Supervision/safety;Set up;Standing Upper Body Dressing Details (indicate cue type and reason): Pt donned shirt with supervision and Min VCs adherance to cervical precautions Lower Body Dressing: Set up;Supervision/safety;Sit to/from stand Lower Body Dressing Details (indicate cue type and reason): Donned underwear, pants, and shoes with supervision and Min VCs for compensatory techniques Toilet Transfer: Supervision/safety;Ambulation;Regular Toilet   Toileting- Water quality scientist and Hygiene: Supervision/safety;Sit to/from stand       Functional mobility during ADLs: Supervision/safety General ADL Comments: Pt performing at supervision level. Provided education on cervical precautions, dressing, bathing, and toileting.     Vision Baseline Vision/History: Wears glasses Wears Glasses: Reading only Patient Visual Report: No change from baseline       Perception     Praxis      Pertinent Vitals/Pain Pain Assessment: Faces Faces Pain Scale: Hurts little more Pain Location: Neck Pain Descriptors / Indicators: Constant;Discomfort;Grimacing Pain Intervention(s): Monitored during session     Hand Dominance Right   Extremity/Trunk Assessment Upper Extremity Assessment Upper Extremity Assessment: Overall WFL for tasks assessed   Lower Extremity Assessment Lower Extremity Assessment: Defer to PT evaluation   Cervical / Trunk Assessment Cervical / Trunk Assessment: Other exceptions Cervical / Trunk Exceptions: s/p cervical surgery   Communication Communication Communication: No  difficulties   Cognition Arousal/Alertness: Awake/alert Behavior  During Therapy: WFL for tasks assessed/performed Overall Cognitive Status: Within Functional Limits for tasks assessed                                     General Comments       Exercises     Shoulder Instructions      Home Living Family/patient expects to be discharged to:: Private residence Living Arrangements: Spouse/significant other Available Help at Discharge: Family;Available 24 hours/day Type of Home: House Home Access: Stairs to enter [employer] of Steps: 2 Entrance Stairs-Rails: None Home Layout: One level     Bathroom Shower/Tub: Occupational psychologist: Standard Bathroom Accessibility: Yes   Home Equipment: Cane - single point;Walker - 2 wheels;Shower seat - built in          Prior Functioning/Environment Level of Independence: Independent                 OT Problem List: Decreased range of motion;Decreased knowledge of precautions;Pain      OT Treatment/Interventions:      OT Goals(Current goals can be found in the care plan section) Acute Rehab OT Goals Patient Stated Goal: Home today OT Goal Formulation: With patient Time For Goal Achievement: 06/13/17 Potential to Achieve Goals: Good  OT Frequency:     Barriers to D/C:            Co-evaluation              AM-PAC PT "6 Clicks" Daily Activity     Outcome Measure Help from another person eating meals?: None Help from another person taking care of personal grooming?: None Help from another person toileting, which includes using toliet, bedpan, or urinal?: None Help from another person bathing (including washing, rinsing, drying)?: None Help from another person to put on and taking off regular upper body clothing?: None Help from another person to put on and taking off regular lower body clothing?: None 6 Click Score: 24   End of Session Nurse Communication: Mobility  status  Activity Tolerance: Patient tolerated treatment well Patient left: in chair;with call bell/phone within reach  OT Visit Diagnosis: Unsteadiness on feet (R26.81);Other (comment) (Cervical surgery with precautions)                Time: 0272-5366 OT Time Calculation (min): 23 min Charges:  OT General Charges $OT Visit: 1 Visit OT Evaluation $OT Eval Low Complexity: 1 Low G-Codes: OT G-codes **NOT FOR INPATIENT CLASS** Functional Assessment Tool Used: Clinical judgement Functional Limitation: Self care Self Care Current Status (Y4034): At least 1 percent but less than 20 percent impaired, limited or restricted Self Care Goal Status (V4259): 0 percent impaired, limited or restricted Self Care Discharge Status [phone]): At least 1 percent but less than 20 percent impaired, limited or restricted   [place], OTR/L Acute Rehab Pager: [phone] Office: [place] 05/30/2017, 5:04 PM

## 2017-05-30 NOTE — Op Note (Signed)
05/30/2017  10:18 AM  PATIENT:  Autumn Frye  56 y.o. female  PRE-OPERATIVE DIAGNOSIS:  Cervical spondylosis with radiculopathy; cervical stenosis C5-6, C6-7  POST-OPERATIVE DIAGNOSIS:  Same  PROCEDURE:  C5-7 anterior discectomy with fusion and plate fixation  SURGEON:  Aldean Ast, MD  ASSISTANTS: Newman Pies, MD  ANESTHESIA:   General  DRAINS: Medium Hemovac   SPECIMEN:  None  INDICATION FOR PROCEDURE: 56 year old woman with neck and arm pain.  She has not improved with medical management.  I recommended the above operation.   Patient understood the risks, benefits, and alternatives and potential outcomes and wished to proceed.  PROCEDURE DETAILS: Patient was brought to the operating room placed under general endotracheal anesthesia. Patient was placed in the supine position on the operating room table. The neck was prepped with betadine and chloraprep and draped in a sterile fashion.   A transverse incision was made on the right side of the neck. Dissection was carried down thru the subcutaneous tissue and the platysma was elevated, opened vertically, and undermined with Metzenbaum scissors. Dissection was then carried out thru an avascular plane leaving the sternocleidomastoid, carotid artery, and jugular vein laterally and the trachea and esophagus medially. The ventral aspect of the vertebral column was identified and a localizing x-ray was taken. The C5-6 level was identified. The longus colli muscles were then elevated and the retractor was placed. The annulus was incised and the disc space entered. Discectomy was performed with micro-curettes and pituitary and kerrison rongeurs. I then used the high-speed drill to drill the endplates down to the level of the posterior longitudinal ligament. The operating microscope was draped and brought into the field provided additional magnification, illumination and visualization. Utilizing microsurgical technique, discectomy  was continued posteriorly thru the disc space. Posterior longitudinal ligament was opened with a nerve hook, and then removed along with disc herniation and osteophytes, decompressing the spinal canal and thecal sac. We then continued to remove osteophytic overgrowth and disc material decompressing the neural foramina and exiting nerve roots bilaterally. So by both visualization and palpation we felt we had an adequate decompression of the neural elements. We then measured the height of the intravertebral disc space and selected a Peek interbody cage packed with equivabone. It was then gently positioned in the intravertebral disc space and countersunk.   The superior distraction pin was then removed and bone bleeding was stopped with bone wax. The distraction pin was inserted at the inferior level. The annulus was incised and the disc space entered. Discectomy was performed with micro-curettes and pituitary and kerrison rongeurs. I then used the high-speed drill to drill the endplates down to the level of the posterior longitudinal ligament. The operating microscope was returned to the field.  The discectomy was continued posteriorly thru the disc space. Posterior longitudinal ligament was opened with a nerve hook, and then removed along with disc herniation and osteophytes, decompressing the spinal canal and thecal sac. We then continued to remove osteophytic overgrowth and disc material decompressing the neural foramina and exiting nerve roots bilaterally. So by both visualization and palpation we felt we had an adequate decompression of the neural elements. We then measured the height of the intravertebral disc space and selected a second Peek interbody cage which was packed with equivabone. It was then gently positioned in the intravertebral disc space and countersunk.   I then inserted a titanium plate and placed four variable angle screws into the two superior vertebral bodies and two fixed  angle screws  at the inferior level and locked them into position. The wound was irrigated with bacitracin solution, checked for hemostasis which was established and confirmed. Once meticulous hemostasis was achieved a medium hemovac drain was placed. The platysma was closed with interrupted 3-0 undyed Vicryl suture, the subcuticular layer was closed with running undyed Vicryl suture. The skin edges were approximated with dermabond. The drapes were removed. A sterile dressing was applied. The patient was then awakened from general anesthesia and transferred to the recovery room in stable condition. At the end of the procedure all sponge, needle and instrument counts were correct.   PATIENT DISPOSITION:  PACU - hemodynamically stable.   Delay start of Pharmacological VTE agent (>24hrs) due to surgical blood loss or risk of bleeding:  yes

## 2017-05-30 NOTE — Anesthesia Postprocedure Evaluation (Signed)
Anesthesia Post Note  Patient: CHELCEY CAPUTO  Procedure(s) Performed: Procedure(s) (LRB): Cervical five-six, Cervical six-seven Anterior discectomy with fusion and plate fixation (N/A)     Patient location during evaluation: PACU Anesthesia Type: General Level of consciousness: awake and alert Pain management: pain level controlled Vital Signs Assessment: post-procedure vital signs reviewed and stable Respiratory status: spontaneous breathing, nonlabored ventilation, respiratory function stable and patient connected to nasal cannula oxygen Cardiovascular status: blood pressure returned to baseline and stable Postop Assessment: no apparent nausea or vomiting Anesthetic complications: no    Last Vitals:  Vitals:   05/30/17 1235 05/30/17 1305  BP: 131/70 (!) 142/87  Pulse: 79 89  Resp: 12 18  Temp: 36.5 C 36.6 C  SpO2: 99% 95%    Last Pain:  Vitals:   05/30/17 1100  TempSrc:   PainSc: 10-Worst pain ever                 Joshoa Shawler DAVID

## 2017-05-30 NOTE — Anesthesia Preprocedure Evaluation (Signed)
Anesthesia Evaluation  Patient identified by MRN, date of birth, ID band Patient awake    Reviewed: Allergy & Precautions, NPO status , Patient's Chart, lab work & pertinent test results  Airway Mallampati: I  TM Distance: >3 FB Neck ROM: Full    Dental   Pulmonary former smoker,    Pulmonary exam normal        Cardiovascular hypertension, Pt. on medications Normal cardiovascular exam     Neuro/Psych    GI/Hepatic GERD  Medicated and Controlled,  Endo/Other    Renal/GU      Musculoskeletal  (+) Arthritis , Rheumatoid disorders,    Abdominal   Peds  Hematology   Anesthesia Other Findings   Reproductive/Obstetrics                             Anesthesia Physical Anesthesia Plan  ASA: II  Anesthesia Plan: General   Post-op Pain Management:    Induction: Intravenous  PONV Risk Score and Plan: 3 and Ondansetron, Dexamethasone and Midazolam  Airway Management Planned: Oral ETT  Additional Equipment:   Intra-op Plan:   Post-operative Plan: Extubation in OR  Informed Consent: I have reviewed the patients History and Physical, chart, labs and discussed the procedure including the risks, benefits and alternatives for the proposed anesthesia with the patient or authorized representative who has indicated his/her understanding and acceptance.     Plan Discussed with: CRNA and Surgeon  Anesthesia Plan Comments:         Anesthesia Quick Evaluation

## 2017-05-30 NOTE — Progress Notes (Signed)
Pharmacy Antibiotic Note  Autumn Frye is a 56 y.o. female admitted on 05/30/2017 for planned spinal surgery. Pharmacy has been consulted for Vancomycin dosing post-op for surgical prophylaxis.   Per RN report, the patient DOES have a drain in place. Pre-op Vancomycin given at 0800. SCr 0.63 (9/20), CrCl~80 ml/min.    Plan: 1. Vancomycin 1g IV every 12 hours 2. F/u BMET in AM 3. Will f/u drain removal and plans to discontinue Vancomycin  Weight: 201 lb (91.2 kg)  Temp (24hrs), Avg:98 F (36.7 C), Min:97.7 F (36.5 C), Max:98.4 F (36.9 C)   Recent Labs Lab 05/26/17 1051  WBC 6.0  CREATININE 0.63    Estimated Creatinine Clearance: 85 mL/min (by C-G formula based on SCr of 0.63 mg/dL).    Allergies  Allergen Reactions  . Ciprofloxacin Hives and Swelling    SWELLING REACTION UNSPECIFIED   . Levaquin [Levofloxacin] Hives  . Lyrica [Pregabalin] Other (See Comments)    ALTERED MENTAL STATUS HALLUCINATIONS  . Compazine [Prochlorperazine Edisylate]     UNSPECIFIED REACTION   . Hydrocodone Itching  . Morphine And Related Itching  . Penicillins Itching  . Sulfa Antibiotics Itching    Thank you for allowing pharmacy to be a part of this patient's care.  Lawson Radar 05/30/2017 1:24 PM

## 2017-05-30 NOTE — H&P (Signed)
CC:  No chief complaint on file. Neck and arm pain  HPI: Autumn Frye is a 56 y.o. female with neck and arm pain due to cervical spondylosis presents for elective C5-6 and C6-7 anterior discectomy with fusion and plate fixation.  She denies new symptoms since I saw her last.  PMH: Past Medical History:  Diagnosis Date  . Arthritis    OA, RA  . Asthma    with a cold  . Autoimmune deficiency syndrome ([place])   . Back pain   . Cancer ([place])    thyroid-1995  . Family history of adverse reaction to anesthesia    mother problems with n/v  . Fibromyalgia   . GERD (gastroesophageal reflux disease)   . Hepatitis    'A" when pt. was 12 yrs. old  . Hiatal hernia   . Hyperlipidemia   . Hypertension   . Hypothyroidism   . Pneumonia     PSH: Past Surgical History:  Procedure Laterality Date  . ANTERIOR LAT LUMBAR FUSION Left 01/26/2013   Procedure: ANTERIOR LATERAL LUMBAR FUSION 1 LEVEL;  Surgeon: Faythe Ghee, MD;  Location: [place] [facility];  Service: Neurosurgery;  Laterality: Left;  lumbar four-five  . KNEE ARTHROPLASTY Left 02/16/2016   Procedure: COMPUTER ASSISTED TOTAL KNEE ARTHROPLASTY;  Surgeon: Dereck Leep, MD;  Location: [facility] ORS;  Service: Orthopedics;  Laterality: Left;  . KNEE SURGERY     3 on right knee, 1 on left-prior to TKR  . LUMBAR PERCUTANEOUS PEDICLE SCREW 1 LEVEL Left 01/26/2013   Procedure: LUMBAR PERCUTANEOUS PEDICLE SCREW 1 LEVEL;  Surgeon: Faythe Ghee, MD;  Location: [place] [facility];  Service: Neurosurgery;  Laterality: Left;  lumbar four-five  . PILONIDAL CYST EXCISION    . THYROIDECTOMY    . TONSILLECTOMY    . TOTAL KNEE ARTHROPLASTY     right  . UPPER GASTROINTESTINAL ENDOSCOPY    . VAGINAL HYSTERECTOMY      SH: Social History  Substance Use Topics  . Smoking status: Former Smoker    Packs/day: 0.50    Years: 40.00    Types: Cigarettes    Quit date: 07/05/2015  . Smokeless tobacco: Never Used  . Alcohol use 0.0 oz/week     Comment: rare     MEDS: Prior to Admission medications   Medication Sig Start Date End Date Taking? Authorizing Provider  Acetylcysteine (N-ACETYL-L-CYSTEINE PO) Take 1 capsule by mouth daily.   Yes [provider]  albuterol (PROAIR HFA) 108 (90 Base) MCG/ACT inhaler Inhale 1-2 puffs into the lungs every 6 (six) hours as needed for wheezing or shortness of breath. Reported on 03/12/2016 03/12/16  Yes Forcucci, Courtney, PA-C  albuterol (PROVENTIL) (2.5 MG/3ML) 0.083% nebulizer solution Take 3 mLs (2.5 mg total) by nebulization every 6 (six) hours as needed for wheezing or shortness of breath. 09/12/13  Yes Vicie Mutters, PA-C  amitriptyline (ELAVIL) 25 MG tablet Take 75 mg by mouth at bedtime.    Yes [provider]  aspirin EC 81 MG tablet Take 81 mg by mouth daily.   Yes [provider]  atorvastatin (LIPITOR) 80 MG tablet Take 40 mg by mouth 3 (three) times a week.   Yes [provider]  AVENOVA 0.01 % SOLN Place 1 application into both eyes 2 (two) times daily. Apply to eyelids BID 09/04/14  Yes [provider]  buprenorphine (BUTRANS) 10 MCG/HR PTWK patch Place 10 mcg onto the skin once a week.   Yes [provider]  Cholecalciferol (VITAMIN D3) 5000 UNITS TABS Take 5,000 Units by mouth daily.    Yes [provider]  CRANBERRY CONCENTRATE PO Take 1 capsule by mouth daily.   Yes [provider]  cyclobenzaprine (FLEXERIL) 10 MG tablet TAKE 1 TABLET(10 MG) BY MOUTH AT BEDTIME 09/11/16  Yes Vicie Mutters, PA-C  FEMRING 0.1 MG/24HR RING INSERT 1 RING VAGINALLY EVERY 3 MONTHS 08/29/16  Yes Unk Pinto, MD  furosemide (LASIX) 40 MG tablet TAKE 1 TABLET BY MOUTH THREE TIMES DAILY FOR FLUID RETENTION Patient taking differently: TAKE 1 TABLET BY MOUTH daily      takes as needed 05/11/15  Yes Unk Pinto, MD  L-GLUTAMINE PO Take 1 capsule by mouth daily.   Yes [provider]  l-methylfolate-B6-B12 (METANX) 3-35-2 MG TABS tablet  Take 1 tablet by mouth daily.   Yes [provider]  levothyroxine (SYNTHROID, LEVOTHROID) 150 MCG tablet TAKE 1 TABLET BY MOUTH EVERY DAY 03/24/17  Yes Vicie Mutters, PA-C  LOTEMAX 0.5 % GEL Place 1 drop into both eyes 3 (three) times daily.  07/26/14  Yes [provider]  Magnesium Oxide 500 MG TABS Take 500 mg by mouth daily.   Yes [provider]  Menaquinone-7 (VITAMIN K2) 100 MCG CAPS Take 1 capsule by mouth daily.   Yes [provider]  MILK THISTLE EXTRACT PO Take 2 capsules by mouth daily.   Yes [provider]  Omega-3 Fatty Acids (FISH OIL) 1200 MG CAPS Take 2 capsules by mouth daily.    Yes [provider]  Polyethyl Glycol-Propyl Glycol (SYSTANE) 0.4-0.3 % SOLN Place 1 drop into both eyes 4 (four) times daily.    Yes [provider]  RESTASIS 0.05 % ophthalmic emulsion Place 1 drop into both eyes daily.  08/07/14  Yes [provider]  TURMERIC CURCUMIN PO Take 900 mg elemental calcium/kg/hr by mouth 2 (two) times daily.   Yes [provider]    ALLERGY: Allergies  Allergen Reactions  . Ciprofloxacin Hives and Swelling    SWELLING REACTION UNSPECIFIED   . Levaquin [Levofloxacin] Hives  . Lyrica [Pregabalin] Other (See Comments)    ALTERED MENTAL STATUS HALLUCINATIONS  . Compazine [Prochlorperazine Edisylate]     UNSPECIFIED REACTION   . Hydrocodone Itching  . Morphine And Related Itching  . Penicillins Itching  . Sulfa Antibiotics Itching    ROS: ROS  NEUROLOGIC EXAM: Awake, alert, oriented Memory and concentration grossly intact Speech fluent, appropriate CN grossly intact Motor exam: Upper Extremities Deltoid Bicep Tricep Grip  Right 5/5 5/5 5/5 5/5  Left 5/5 5/5 5/5 5/5   Lower Extremity IP Quad PF DF EHL  Right 5/5 5/5 5/5 5/5 5/5  Left 5/5 5/5 5/5 5/5 5/5   Sensation grossly intact to LT  IMAGING: No new imaging  IMPRESSION: - 56 y.o. female with cervical spondylosis  with radiculopathy.  She complains of neck and arm pain.  She has not responded adequately to medical management.  PLAN: - I have recommended C5-7 ACDF - We have discussed the risks, benefits, and alternatives to surgery and she wishes to proceed.

## 2017-05-31 ENCOUNTER — Encounter (HOSPITAL_COMMUNITY): Payer: Self-pay | Admitting: Neurological Surgery

## 2017-05-31 DIAGNOSIS — M4722 Other spondylosis with radiculopathy, cervical region: Secondary | ICD-10-CM | POA: Diagnosis not present

## 2017-05-31 DIAGNOSIS — I10 Essential (primary) hypertension: Secondary | ICD-10-CM | POA: Diagnosis not present

## 2017-05-31 DIAGNOSIS — Z79891 Long term (current) use of opiate analgesic: Secondary | ICD-10-CM | POA: Diagnosis not present

## 2017-05-31 DIAGNOSIS — Z79899 Other long term (current) drug therapy: Secondary | ICD-10-CM | POA: Diagnosis not present

## 2017-05-31 DIAGNOSIS — Z96653 Presence of artificial knee joint, bilateral: Secondary | ICD-10-CM | POA: Diagnosis not present

## 2017-05-31 DIAGNOSIS — E785 Hyperlipidemia, unspecified: Secondary | ICD-10-CM | POA: Diagnosis not present

## 2017-05-31 DIAGNOSIS — Z87891 Personal history of nicotine dependence: Secondary | ICD-10-CM | POA: Diagnosis not present

## 2017-05-31 DIAGNOSIS — Z7982 Long term (current) use of aspirin: Secondary | ICD-10-CM | POA: Diagnosis not present

## 2017-05-31 DIAGNOSIS — M4802 Spinal stenosis, cervical region: Secondary | ICD-10-CM | POA: Diagnosis not present

## 2017-05-31 DIAGNOSIS — K219 Gastro-esophageal reflux disease without esophagitis: Secondary | ICD-10-CM | POA: Diagnosis not present

## 2017-05-31 DIAGNOSIS — E890 Postprocedural hypothyroidism: Secondary | ICD-10-CM | POA: Diagnosis not present

## 2017-05-31 LAB — BASIC METABOLIC PANEL
ANION GAP: 8 (ref 5–15)
BUN: 8 mg/dL (ref 6–20)
CALCIUM: 9 mg/dL (ref 8.9–10.3)
CO2: 24 mmol/L (ref 22–32)
CREATININE: 0.61 mg/dL (ref 0.44–1.00)
Chloride: 102 mmol/L (ref 101–111)
Glucose, Bld: 142 mg/dL — ABNORMAL HIGH (ref 65–99)
Potassium: 4.7 mmol/L (ref 3.5–5.1)
SODIUM: 134 mmol/L — AB (ref 135–145)

## 2017-05-31 MED ORDER — METHOCARBAMOL 750 MG PO TABS: 750.0000 mg | ORAL_TABLET | Freq: Four times a day (QID) | ORAL | 2 refills | Status: DC

## 2017-05-31 MED ORDER — GABAPENTIN 300 MG PO CAPS: 300.0000 mg | ORAL_CAPSULE | Freq: Three times a day (TID) | ORAL | 2 refills | Status: DC

## 2017-05-31 MED ORDER — OXYCODONE-ACETAMINOPHEN 7.5-325 MG PO TABS: 1.0000 | ORAL_TABLET | ORAL | 0 refills | Status: DC | PRN

## 2017-05-31 NOTE — Progress Notes (Signed)
Pt given D/C instructions with Rx, verbal understanding was provided. Pt's incision is clean and dry with no sign of infection. Pt's IV and Hemovac were removed prior to D/C. Pt D/C'd home via wheelchair @ [phone] per MD order. Pt is stable @ D/c and has no other needs at this time. Holli Humbles, RN

## 2017-05-31 NOTE — Discharge Summary (Signed)
Physician Discharge Summary  Patient ID: Autumn Frye MRN: [id number] DOB/AGE: April 02, 1961 56 y.o.  Admit date: 05/30/2017 Discharge date: 05/31/2017  Admission Diagnoses:  Cervical spondylosis with radiculopathy  Discharge Diagnoses:  Same Active Problems:   Cervical spondylosis with radiculopathy   Discharged Condition: Stable  Hospital Course:  Autumn Frye is a 56 y.o. female who was admitted for the below procedure. There were no post operative complications. At time of discharge, pain was well controlled, ambulating with Pt/OT, tolerating po, voiding normal. Ready for discharge.  Treatments: Surgery - C5-7 anterior discectomy with fusion and plate fixation  Discharge Exam: Blood pressure 121/80, pulse 68, temperature 97.7 F (36.5 C), temperature source Oral, resp. rate 18, weight 91.2 kg (201 lb), SpO2 99 %. Awake, alert, oriented Speech fluent, appropriate CN grossly intact 5/5 BUE/BLE Wound c/d/i  Disposition: 01-Home or Self Care  Discharge Instructions    Call MD for:  difficulty breathing, headache or visual disturbances    Complete by:  As directed    Call MD for:  persistant dizziness or light-headedness    Complete by:  As directed    Call MD for:  redness, tenderness, or signs of infection (pain, swelling, redness, odor or green/yellow discharge around incision site)    Complete by:  As directed    Call MD for:  severe uncontrolled pain    Complete by:  As directed    Call MD for:  temperature >100.4    Complete by:  As directed    Diet general    Complete by:  As directed    Driving Restrictions    Complete by:  As directed    Do not drive until given clearance.   Increase activity slowly    Complete by:  As directed    Lifting restrictions    Complete by:  As directed    Do not lift anything >10lbs. Avoid bending and twisting in awkward positions. Avoid bending at the back.   May shower / Bathe    Complete by:  As directed    In 24  hours. Okay to wash wound with warm soapy water. Avoid scrubbing the wound. Pat dry.   Remove dressing in 24 hours    Complete by:  As directed      Allergies as of 05/31/2017      Reactions   Ciprofloxacin Hives, Swelling   SWELLING REACTION UNSPECIFIED    Levaquin [levofloxacin] Hives   Lyrica [pregabalin] Other (See Comments)   ALTERED MENTAL STATUS HALLUCINATIONS   Compazine [prochlorperazine Edisylate]    UNSPECIFIED REACTION    Hydrocodone Itching   Morphine And Related Itching   Penicillins Itching   Sulfa Antibiotics Itching      Medication List    TAKE these medications   albuterol (2.5 MG/3ML) 0.083% nebulizer solution Commonly known as:  PROVENTIL Take 3 mLs (2.5 mg total) by nebulization every 6 (six) hours as needed for wheezing or shortness of breath.   albuterol 108 (90 Base) MCG/ACT inhaler Commonly known as:  PROAIR HFA Inhale 1-2 puffs into the lungs every 6 (six) hours as needed for wheezing or shortness of breath. Reported on 03/12/2016   amitriptyline 25 MG tablet Commonly known as:  ELAVIL Take 75 mg by mouth at bedtime.   aspirin EC 81 MG tablet Take 81 mg by mouth daily.   atorvastatin 80 MG tablet Commonly known as:  LIPITOR Take 40 mg by mouth 3 (three) times a week.   AVENOVA 0.01 %  Soln Place 1 application into both eyes 2 (two) times daily. Apply to eyelids BID   BUTRANS 10 MCG/HR Ptwk patch Generic drug:  buprenorphine Place 10 mcg onto the skin once a week.   cyclobenzaprine 10 MG tablet Commonly known as:  FLEXERIL TAKE 1 TABLET(10 MG) BY MOUTH AT BEDTIME   FEMRING 0.1 MG/24HR Ring Generic drug:  Estradiol Acetate INSERT 1 RING VAGINALLY EVERY 3 MONTHS   Fish Oil 1200 MG Caps Take 2 capsules by mouth daily.   furosemide 40 MG tablet Commonly known as:  LASIX TAKE 1 TABLET BY MOUTH THREE TIMES DAILY FOR FLUID RETENTION What changed:  See the new instructions.   gabapentin 300 MG capsule Commonly known as:  NEURONTIN Take  1 capsule (300 mg total) by mouth 3 (three) times daily.   L-GLUTAMINE PO Take 1 capsule by mouth daily.   l-methylfolate-B6-B12 3-35-2 MG Tabs tablet Commonly known as:  METANX Take 1 tablet by mouth daily.   levothyroxine 150 MCG tablet Commonly known as:  SYNTHROID, LEVOTHROID TAKE 1 TABLET BY MOUTH EVERY DAY   LOTEMAX 0.5 % Gel Generic drug:  Loteprednol Etabonate Place 1 drop into both eyes 3 (three) times daily.   Magnesium Oxide 500 MG Tabs Take 500 mg by mouth daily.   methocarbamol 750 MG tablet Commonly known as:  ROBAXIN Take 1 tablet (750 mg total) by mouth 4 (four) times daily.   MILK THISTLE EXTRACT PO Take 2 capsules by mouth daily.   N-ACETYL-L-CYSTEINE PO Take 1 capsule by mouth daily.   oxyCODONE-acetaminophen 7.5-325 MG tablet Commonly known as:  PERCOCET Take 1 tablet by mouth every 4 (four) hours as needed for severe pain.   RESTASIS 0.05 % ophthalmic emulsion Generic drug:  cycloSPORINE Place 1 drop into both eyes daily.   SYSTANE 0.4-0.3 % Soln Generic drug:  Polyethyl Glycol-Propyl Glycol Place 1 drop into both eyes 4 (four) times daily.   TURMERIC CURCUMIN PO Take 900 mg elemental calcium/kg/hr by mouth 2 (two) times daily.   Vitamin D3 5000 units Tabs Take 5,000 Units by mouth daily.   Vitamin K2 100 MCG Caps Take 1 capsule by mouth daily.            Discharge Care Instructions        Start     Ordered   05/31/17 0000  gabapentin (NEURONTIN) 300 MG capsule  3 times daily     05/31/17 0740   05/31/17 0000  methocarbamol (ROBAXIN) 750 MG tablet  4 times daily     05/31/17 0740   05/31/17 0000  oxyCODONE-acetaminophen (PERCOCET) 7.5-325 MG tablet  Every 4 hours PRN     05/31/17 0740   05/31/17 0000  Increase activity slowly     05/31/17 0740   05/31/17 0000  May shower / Bathe    Comments:  In 24 hours. Okay to wash wound with warm soapy water. Avoid scrubbing the wound. Pat dry.   05/31/17 0740   05/31/17 0000  Driving  Restrictions    Comments:  Do not drive until given clearance.   05/31/17 0740   05/31/17 0000  Lifting restrictions    Comments:  Do not lift anything >10lbs. Avoid bending and twisting in awkward positions. Avoid bending at the back.   05/31/17 0740   05/31/17 0000  Diet general     05/31/17 0740   05/31/17 0000  Remove dressing in 24 hours     05/31/17 0740   05/31/17 0000  Call MD  for:  temperature >100.4     05/31/17 0740   05/31/17 0000  Call MD for:  severe uncontrolled pain     05/31/17 0740   05/31/17 0000  Call MD for:  redness, tenderness, or signs of infection (pain, swelling, redness, odor or green/yellow discharge around incision site)     05/31/17 0740   05/31/17 0000  Call MD for:  difficulty breathing, headache or visual disturbances     05/31/17 0740   05/31/17 0000  Call MD for:  persistant dizziness or light-headedness     05/31/17 0740     Follow-up Information    Ditty, Loura Halt, MD. Schedule an appointment as soon as possible for a visit in 3 week(s).   Specialty:  Neurosurgery Contact information: [address] [place] [address] [phone]           Signed: Alyson Ingles 05/31/2017, 7:40 AM

## 2017-06-02 ENCOUNTER — Other Ambulatory Visit: Payer: Self-pay | Admitting: Otolaryngology

## 2017-06-02 ENCOUNTER — Other Ambulatory Visit: Payer: Medicare Other

## 2017-06-02 DIAGNOSIS — E039 Hypothyroidism, unspecified: Secondary | ICD-10-CM

## 2017-06-02 DIAGNOSIS — Z85850 Personal history of malignant neoplasm of thyroid: Secondary | ICD-10-CM

## 2017-06-02 DIAGNOSIS — E782 Mixed hyperlipidemia: Secondary | ICD-10-CM

## 2017-06-02 LAB — LIPID PANEL
CHOL/HDL RATIO: 3.7 (calc) (ref <5.0)
Cholesterol: 236 mg/dL — ABNORMAL HIGH (ref <200)
HDL: 63 mg/dL (ref >50)
LDL Cholesterol (Calc): 143 mg/dL (calc) — ABNORMAL HIGH
NON-HDL CHOLESTEROL (CALC): 173 mg/dL — AB (ref <130)
TRIGLYCERIDES: 165 mg/dL — AB (ref <150)

## 2017-06-02 LAB — TSH: TSH: 1.29 m[IU]/L (ref 0.40–4.50)

## 2017-06-03 NOTE — Progress Notes (Signed)
Future Appointments Date Time Provider [place]  07/05/2017 2:00 PM [facility] [place] [facility]  08/02/2017 10:30 AM Unk Pinto, MD [facility] None  02/14/2018 9:00 AM Unk Pinto, MD [facility] None

## 2017-06-16 ENCOUNTER — Emergency Department (HOSPITAL_COMMUNITY)
Admission: EM | Admit: 2017-06-16 | Discharge: 2017-06-17 | Disposition: A | Discharge status: Home / Self Care | Payer: Medicare Other | Attending: Emergency Medicine | Admitting: Emergency Medicine

## 2017-06-16 ENCOUNTER — Encounter (HOSPITAL_COMMUNITY): Payer: Self-pay

## 2017-06-16 ENCOUNTER — Emergency Department (HOSPITAL_COMMUNITY): Payer: Medicare Other

## 2017-06-16 DIAGNOSIS — R7303 Prediabetes: Secondary | ICD-10-CM | POA: Diagnosis not present

## 2017-06-16 DIAGNOSIS — N3001 Acute cystitis with hematuria: Secondary | ICD-10-CM | POA: Insufficient documentation

## 2017-06-16 DIAGNOSIS — Z79899 Other long term (current) drug therapy: Secondary | ICD-10-CM | POA: Insufficient documentation

## 2017-06-16 DIAGNOSIS — Z7982 Long term (current) use of aspirin: Secondary | ICD-10-CM | POA: Diagnosis not present

## 2017-06-16 DIAGNOSIS — I10 Essential (primary) hypertension: Secondary | ICD-10-CM | POA: Diagnosis not present

## 2017-06-16 DIAGNOSIS — Z87891 Personal history of nicotine dependence: Secondary | ICD-10-CM | POA: Insufficient documentation

## 2017-06-16 DIAGNOSIS — J45909 Unspecified asthma, uncomplicated: Secondary | ICD-10-CM | POA: Insufficient documentation

## 2017-06-16 DIAGNOSIS — R310 Gross hematuria: Secondary | ICD-10-CM | POA: Diagnosis not present

## 2017-06-16 DIAGNOSIS — Z96653 Presence of artificial knee joint, bilateral: Secondary | ICD-10-CM | POA: Diagnosis not present

## 2017-06-16 DIAGNOSIS — Z85850 Personal history of malignant neoplasm of thyroid: Secondary | ICD-10-CM | POA: Diagnosis not present

## 2017-06-16 DIAGNOSIS — R1030 Lower abdominal pain, unspecified: Secondary | ICD-10-CM | POA: Diagnosis present

## 2017-06-16 DIAGNOSIS — E039 Hypothyroidism, unspecified: Secondary | ICD-10-CM | POA: Insufficient documentation

## 2017-06-16 LAB — URINALYSIS, MICROSCOPIC (REFLEX)
BACTERIA UA: NONE SEEN
Squamous Epithelial / LPF: NONE SEEN
WBC UA: NONE SEEN WBC/hpf (ref 0–5)

## 2017-06-16 LAB — URINALYSIS, ROUTINE W REFLEX MICROSCOPIC

## 2017-06-16 MED ORDER — SODIUM CHLORIDE 0.9 % IV BOLUS (SEPSIS)
1000.0000 mL | Freq: Once | INTRAVENOUS | Status: AC
  Administered 2017-06-16: 1000 mL via INTRAVENOUS

## 2017-06-16 NOTE — ED Provider Notes (Signed)
[facility] DEPT Provider Note   CSN: [id number] Arrival date & time: 06/16/17  2217     History   Chief Complaint Chief Complaint  Patient presents with  . Urinary Retention    HPI Autumn Frye is a 56 y.o. female.  56 year old female with a history of reflux, dyslipidemia, hypertension, rheumatoid arthritis presents to the emergency department for evaluation of suprapubic pressure. She reports pressure onset this evening with hematuria and urinary urgency and frequency. She states that symptoms were preceded by right flank pain 2 days ago. She initially attributed her back pain to chronic pain which she experiences and is followed and orthopedist for. She denies any fevers, vomiting, vaginal bleeding or discharge. No medications taken prior to arrival for symptoms. She denies the use of chronic anticoagulation. No hx of kidney stones. Abdominal surgical hx significant for vaginal hysterectomy.      Past Medical History:  Diagnosis Date  . Arthritis    OA, RA  . Asthma    with a cold  . Autoimmune deficiency syndrome ([place])   . Back pain   . Cancer ([place])    thyroid-1995  . Family history of adverse reaction to anesthesia    mother problems with n/v  . Fibromyalgia   . GERD (gastroesophageal reflux disease)   . Hepatitis    'A" when pt. was 12 yrs. old  . Hiatal hernia   . Hyperlipidemia   . Hypertension   . Hypothyroidism   . Pneumonia     Patient Active Problem List   Diagnosis Date Noted  . Cervical spondylosis with radiculopathy 05/30/2017  . Rheumatoid arthritis ([place]) 11/14/2015  . Medicare annual wellness visit, subsequent 04/24/2015  . Axillary hidradenitis suppurativa 07/05/2014  . Sjogren's disease ([place]) 07/05/2014  . Medication management 03/26/2014  . Mixed hyperlipidemia 09/24/2013  . Prediabetes 09/24/2013  . Vitamin D deficiency 09/24/2013  . Intrinsic asthma   . Hypertension   . Hypothyroidism   . Degeneration of intervertebral disc of  lumbosacral region 05/23/2008    Past Surgical History:  Procedure Laterality Date  . ANTERIOR CERVICAL DECOMP/DISCECTOMY FUSION N/A 05/30/2017   Procedure: Cervical five-six, Cervical six-seven Anterior discectomy with fusion and plate fixation;  Surgeon: Ditty, Kevan Ny, MD;  Location: [facility];  Service: Neurosurgery;  Laterality: N/A;  . ANTERIOR LAT LUMBAR FUSION Left 01/26/2013   Procedure: ANTERIOR LATERAL LUMBAR FUSION 1 LEVEL;  Surgeon: Faythe Ghee, MD;  Location: [facility];  Service: Neurosurgery;  Laterality: Left;  lumbar four-five  . KNEE ARTHROPLASTY Left 02/16/2016   Procedure: COMPUTER ASSISTED TOTAL KNEE ARTHROPLASTY;  Surgeon: Dereck Leep, MD;  Location: [facility];  Service: Orthopedics;  Laterality: Left;  . KNEE SURGERY     3 on right knee, 1 on left-prior to TKR  . LUMBAR PERCUTANEOUS PEDICLE SCREW 1 LEVEL Left 01/26/2013   Procedure: LUMBAR PERCUTANEOUS PEDICLE SCREW 1 LEVEL;  Surgeon: Faythe Ghee, MD;  Location: [facility];  Service: Neurosurgery;  Laterality: Left;  lumbar four-five  . PILONIDAL CYST EXCISION    . THYROIDECTOMY    . TONSILLECTOMY    . TOTAL KNEE ARTHROPLASTY     right  . UPPER GASTROINTESTINAL ENDOSCOPY    . VAGINAL HYSTERECTOMY      OB History    No data available       Home Medications    Prior to Admission medications   Medication Sig Start Date End Date Taking? Authorizing Provider  Acetylcysteine (N-ACETYL-L-CYSTEINE PO) Take 1  capsule by mouth daily.   Yes [provider]  albuterol (PROAIR HFA) 108 (90 Base) MCG/ACT inhaler Inhale 1-2 puffs into the lungs every 6 (six) hours as needed for wheezing or shortness of breath. Reported on 03/12/2016 03/12/16  Yes Forcucci, Courtney, PA-C  albuterol (PROVENTIL) (2.5 MG/3ML) 0.083% nebulizer solution Take 3 mLs (2.5 mg total) by nebulization every 6 (six) hours as needed for wheezing or shortness of breath. 09/12/13  Yes Vicie Mutters, PA-C  amitriptyline (ELAVIL) 25 MG  tablet Take 75 mg by mouth at bedtime.    Yes [provider]  aspirin EC 81 MG tablet Take 81 mg by mouth daily.   Yes [provider]  atorvastatin (LIPITOR) 80 MG tablet Take 40 mg by mouth 3 (three) times a week.   Yes [provider]  AVENOVA 0.01 % SOLN Place 1 application into both eyes 2 (two) times daily. Apply to eyelids BID 09/04/14  Yes [provider]  buprenorphine (BUTRANS) 10 MCG/HR PTWK patch Place 10 mcg onto the skin once a week.   Yes [provider]  Calcium Citrate-Vitamin D 250-200 MG-UNIT TABS Take 1 tablet by mouth daily.   Yes [provider]  Cholecalciferol (VITAMIN D3) 5000 UNITS TABS Take 5,000 Units by mouth daily.    Yes [provider]  cyclobenzaprine (FLEXERIL) 10 MG tablet TAKE 1 TABLET(10 MG) BY MOUTH AT BEDTIME 09/11/16  Yes Vicie Mutters, PA-C  FEMRING 0.1 MG/24HR RING INSERT 1 RING VAGINALLY EVERY 3 MONTHS 08/29/16  Yes Unk Pinto, MD  furosemide (LASIX) 40 MG tablet TAKE 1 TABLET BY MOUTH THREE TIMES DAILY FOR FLUID RETENTION Patient taking differently: TAKE 1 TABLET BY MOUTH daily      takes as needed 05/11/15  Yes Unk Pinto, MD  L-GLUTAMINE PO Take 1 capsule by mouth daily.   Yes [provider]  l-methylfolate-B6-B12 (METANX) 3-35-2 MG TABS tablet Take 1 tablet by mouth daily.   Yes [provider]  levothyroxine (SYNTHROID, LEVOTHROID) 150 MCG tablet TAKE 1 TABLET BY MOUTH EVERY DAY 03/24/17  Yes Vicie Mutters, PA-C  LOTEMAX 0.5 % GEL Place 1 drop into both eyes 3 (three) times daily.  07/26/14  Yes [provider]  Magnesium Oxide 500 MG TABS Take 500 mg by mouth daily.   Yes [provider]  Menaquinone-7 (VITAMIN K2) 100 MCG CAPS Take 1 capsule by mouth daily.   Yes [provider]  methocarbamol (ROBAXIN) 750 MG tablet Take 1 tablet (750 mg total) by mouth 4 (four) times daily. 05/31/17  Yes Costella, Vista Mink, PA-C  MILK THISTLE  EXTRACT PO Take 2 capsules by mouth daily.   Yes [provider]  Omega-3 Fatty Acids (FISH OIL) 1200 MG CAPS Take 2 capsules by mouth daily.    Yes [provider]  oxyCODONE-acetaminophen (PERCOCET) 7.5-325 MG tablet Take 1 tablet by mouth every 4 (four) hours as needed for severe pain. 05/31/17  Yes Costella, Vista Mink, PA-C  Polyethyl Glycol-Propyl Glycol (SYSTANE) 0.4-0.3 % SOLN Place 1 drop into both eyes 4 (four) times daily.    Yes [provider]  RESTASIS 0.05 % ophthalmic emulsion Place 1 drop into both eyes daily.  08/07/14  Yes [provider]  TURMERIC CURCUMIN PO Take 900 mg elemental calcium/kg/hr by mouth 2 (two) times daily.   Yes [provider]  Abatacept 125 MG/ML SOSY Inject 1 Syringe into the skin every 7 (seven) days.    [provider]  acetaminophen (TYLENOL)  500 MG tablet Take 1 tablet (500 mg total) by mouth every 6 (six) hours as needed. 06/17/17   Antonietta Breach, PA-C  cephALEXin (KEFLEX) 500 MG capsule Take 1 capsule (500 mg total) by mouth 4 (four) times daily. 06/17/17   Antonietta Breach, PA-C  phenazopyridine (PYRIDIUM) 200 MG tablet Take 1 tablet (200 mg total) by mouth 3 (three) times daily. 06/17/17   Antonietta Breach, PA-C    Family History Family History  Problem Relation Age of Onset  . Hypertension Father   . Stroke Father   . Diabetes Brother   . Asthma Brother   . Other Daughter        XED CONNECTIVE TISSUE  . Cancer Maternal Grandmother        BREAST  . Breast cancer Maternal Grandmother 51  . Colon cancer Neg Hx   . Esophageal cancer Neg Hx   . Stomach cancer Neg Hx   . Rectal cancer Neg Hx     Social History Social History  Substance Use Topics  . Smoking status: Former Smoker    Packs/day: 0.50    Years: 40.00    Types: Cigarettes    Quit date: 07/05/2015  . Smokeless tobacco: Never Used  . Alcohol use 0.0 oz/week     Comment: rare     Allergies   Ciprofloxacin; Levaquin  [levofloxacin]; Lyrica [pregabalin]; Compazine [prochlorperazine edisylate]; Hydrocodone; Morphine and related; Penicillins; and Sulfa antibiotics   Review of Systems Review of Systems Ten systems reviewed and are negative for acute change, except as noted in the HPI.    Physical Exam Updated Vital Signs BP 116/88   Pulse 88   Temp 97.8 F (36.6 C) (Oral)   Resp 18   Ht 5\' 3"  (1.6 m)   Wt 89.4 kg (197 lb)   LMP  (LMP Unknown)   SpO2 98%   BMI 34.90 kg/m   Physical Exam  Constitutional: She is oriented to person, place, and time. She appears well-developed and well-nourished. No distress.  Patient nontoxic, tearful.  HENT:  Head: Normocephalic and atraumatic.  Eyes: Conjunctivae and EOM are normal. No scleral icterus.  Neck: Normal range of motion.  Pulmonary/Chest: Effort normal. No respiratory distress.  Respirations even and unlabored  Abdominal: Soft. She exhibits no mass. There is tenderness. There is no guarding.  No CVA TTP. Suprapubic TTP noted. No peritoneal signs. Abdomen obese, soft.  Musculoskeletal: Normal range of motion.  Neurological: She is alert and oriented to person, place, and time. She exhibits normal muscle tone. Coordination normal.  Ambulatory with steady gait.  Skin: Skin is warm and dry. No rash noted. She is not diaphoretic. No erythema. No pallor.  Psychiatric: She has a normal mood and affect. Her behavior is normal.  Nursing note and vitals reviewed.    ED Treatments / Results  Labs (all labs ordered are listed, but only abnormal results are displayed) Labs Reviewed  URINALYSIS, ROUTINE W REFLEX MICROSCOPIC - Abnormal; Notable for the following:       Result Value   Color, Urine RED (*)    APPearance TURBID (*)    Glucose, UA   (*)    Value: TEST NOT REPORTED DUE TO COLOR INTERFERENCE OF URINE PIGMENT   Hgb urine dipstick   (*)    Value: TEST NOT REPORTED DUE TO COLOR INTERFERENCE OF URINE PIGMENT   Bilirubin Urine   (*)    Value:  TEST NOT REPORTED DUE TO COLOR INTERFERENCE OF URINE PIGMENT  Ketones, ur   (*)    Value: TEST NOT REPORTED DUE TO COLOR INTERFERENCE OF URINE PIGMENT   Protein, ur   (*)    Value: TEST NOT REPORTED DUE TO COLOR INTERFERENCE OF URINE PIGMENT   Nitrite   (*)    Value: TEST NOT REPORTED DUE TO COLOR INTERFERENCE OF URINE PIGMENT   Leukocytes, UA   (*)    Value: TEST NOT REPORTED DUE TO COLOR INTERFERENCE OF URINE PIGMENT   All other components within normal limits  CBC - Abnormal; Notable for the following:    WBC 10.6 (*)    All other components within normal limits  URINE CULTURE  BASIC METABOLIC PANEL  URINALYSIS, MICROSCOPIC (REFLEX)    EKG  EKG Interpretation None       Radiology Ct Renal Stone Study  Result Date: 06/17/2017 CLINICAL DATA:  Acute onset of gross hematuria and pelvic pressure. Initial encounter. EXAM: CT ABDOMEN AND PELVIS WITHOUT CONTRAST TECHNIQUE: Multidetector CT imaging of the abdomen and pelvis was performed following the standard protocol without IV contrast. COMPARISON:  CT of the abdomen and pelvis performed 10/11/2014, and abdominal ultrasound performed 01/29/2015. MRI of the lumbar spine performed 05/15/2017 FINDINGS: Lower chest: The visualized lung bases are grossly clear. The visualized portions of the mediastinum are unremarkable. Hepatobiliary: The liver is unremarkable in appearance. The gallbladder is unremarkable in appearance. The common bile duct remains normal in caliber. Pancreas: The pancreas is within normal limits. Spleen: The spleen is unremarkable in appearance. Adrenals/Urinary Tract: The adrenal glands are unremarkable in appearance. The kidneys are within normal limits. There is no evidence of hydronephrosis. No renal or ureteral stones are identified. No perinephric stranding is seen. Stomach/Bowel: The stomach is unremarkable in appearance. The small bowel is within normal limits. The appendix is not visualized; there is no evidence for  appendicitis. Mild diverticulosis is noted along the sigmoid colon, without evidence of diverticulitis. Vascular/Lymphatic: Minimal calcification is noted along the distal abdominal aorta and its branches. No retroperitoneal or pelvic sidewall lymphadenopathy is seen. The inferior vena cava is grossly unremarkable in appearance. Reproductive: The bladder is decompressed and not well assessed. The patient is status post hysterectomy. A pessary is noted at the vagina. No suspicious adnexal masses are seen. Other: No additional soft tissue abnormalities are seen. Musculoskeletal: No acute osseous abnormalities are identified. The patient is status post lumbar spinal fusion at L4-L5. The visualized musculature is unremarkable in appearance. IMPRESSION: No evidence of hydronephrosis.  No renal or ureteral stones seen. Electronically Signed   By: Garald Balding M.D.   On: 06/17/2017 00:09    Procedures Procedures (including critical care time)  Medications Ordered in ED Medications  sodium chloride 0.9 % bolus 1,000 mL (0 mLs Intravenous Stopped 06/17/17 0214)  cefTRIAXone (ROCEPHIN) 1 g in dextrose 5 % 50 mL IVPB (0 g Intravenous Stopped 06/17/17 0120)  phenazopyridine (PYRIDIUM) tablet 200 mg (200 mg Oral Given 06/17/17 0120)  sodium chloride 0.9 % bolus 1,000 mL (0 mLs Intravenous Stopped 06/17/17 0317)  cephALEXin (KEFLEX) capsule 500 mg (500 mg Oral Given 06/17/17 0359)     Initial Impression / Assessment and Plan / ED Course  I have reviewed the triage vital signs and the nursing notes.  Pertinent labs & imaging results that were available during my care of the patient were reviewed by me and considered in my medical decision making (see chart for details).     56 year old female presents to the emergenc and frequency.  She further notes hematuria with passage of blood clots. Patient not on chronic anticoagulation. She does believe that she recently had a Foley placed during surgery at the end  of September. She has a history of postoperative urinary tract infection after left knee surgery.  Patient with mild leukocytosis today consistent with potential infection. Unfortunately, degrees of hematuria makes urinalysis difficult. Symptoms are clinically consistent with acute cystitis with hematuria. A CT scan was obtained to rule out kidney stone. Noncontrasted CT shows no evidence of hydronephrosis, renal stone, or ureteral stone.  Patient treated in the emergency department with IV fluids as well as IV Rocephin. She has had improvement in her abdominal pressure and frequency following Pyridium and Toradol. Despite clots, patient has been a weight multiple times in the ED. Kidney function preserved. Will continue with outpatient management on a course of Keflex. PCP follow up advised to ensure resolution of symptoms. Return precautions discussed and provided. Patient discharged in stable condition with no unaddressed concerns.   Vitals:   06/17/17 0220 06/17/17 0230 06/17/17 0300 06/17/17 0353  BP: 140/88 (!) 149/91 (!) 141/91 116/88  Pulse: 90 86 87 88  Resp: 20   18  Temp: 97.8 F (36.6 C)     TempSrc: Oral     SpO2: 99% 99% 99% 98%  Weight:      Height:        Final Clinical Impressions(s) / ED Diagnoses   Final diagnoses:  Acute cystitis with hematuria    New Prescriptions Discharge Medication List as of 06/17/2017  3:30 AM    START taking these medications   Details  acetaminophen (TYLENOL) 500 MG tablet Take 1 tablet (500 mg total) by mouth every 6 (six) hours as needed., Starting Fri 06/17/2017, Print    cephALEXin (KEFLEX) 500 MG capsule Take 1 capsule (500 mg total) by mouth 4 (four) times daily., Starting Fri 06/17/2017, Print    phenazopyridine (PYRIDIUM) 200 MG tablet Take 1 tablet (200 mg total) by mouth 3 (three) times daily., Starting Fri 06/17/2017, Print         Antonietta Breach, PA-C 06/17/17 1941    Quintella Reichert, MD 06/22/17 [phone]

## 2017-06-16 NOTE — ED Triage Notes (Signed)
Pt complains of urinary retention and vaginal pain for two days

## 2017-06-16 NOTE — ED Notes (Signed)
Patient transported to CT 

## 2017-06-16 NOTE — ED Notes (Signed)
Bladder scanned , with 0 volume.  Pt. Attempted to use bathroom, noted with hematuria. Unable to send urine specimen for U/A.  Pt. Reported dysuria.

## 2017-06-17 LAB — BASIC METABOLIC PANEL
ANION GAP: 10 (ref 5–15)
BUN: 14 mg/dL (ref 6–20)
CHLORIDE: 104 mmol/L (ref 101–111)
CO2: 25 mmol/L (ref 22–32)
Calcium: 9.6 mg/dL (ref 8.9–10.3)
Creatinine, Ser: 0.75 mg/dL (ref 0.44–1.00)
GFR calc non Af Amer: >60 mL/min (ref >60)
Glucose, Bld: 98 mg/dL (ref 65–99)
POTASSIUM: 3.9 mmol/L (ref 3.5–5.1)
SODIUM: 139 mmol/L (ref 135–145)

## 2017-06-17 LAB — CBC
HEMATOCRIT: 41.5 % (ref 36.0–46.0)
HEMOGLOBIN: 14 g/dL (ref 12.0–15.0)
MCH: 29.4 pg (ref 26.0–34.0)
MCHC: 33.7 g/dL (ref 30.0–36.0)
MCV: 87 fL (ref 78.0–100.0)
Platelets: 368 10*3/uL (ref 150–400)
RBC: 4.77 MIL/uL (ref 3.87–5.11)
RDW: 14.2 % (ref 11.5–15.5)
WBC: 10.6 10*3/uL — ABNORMAL HIGH (ref 4.0–10.5)

## 2017-06-17 MED ORDER — ACETAMINOPHEN 500 MG PO TABS: 500.0000 mg | ORAL_TABLET | Freq: Four times a day (QID) | ORAL | 0 refills | Status: AC | PRN

## 2017-06-17 MED ORDER — PHENAZOPYRIDINE HCL 200 MG PO TABS: 200.0000 mg | ORAL_TABLET | Freq: Three times a day (TID) | ORAL | 0 refills | Status: DC

## 2017-06-17 MED ORDER — PHENAZOPYRIDINE HCL 200 MG PO TABS
200.0000 mg | ORAL_TABLET | Freq: Three times a day (TID) | ORAL | Status: AC
  Administered 2017-06-17: 200 mg via ORAL
  Filled 2017-06-17: qty 1

## 2017-06-17 MED ORDER — CEPHALEXIN 500 MG PO CAPS
500.0000 mg | ORAL_CAPSULE | Freq: Once | ORAL | Status: AC
  Administered 2017-06-17: 500 mg via ORAL
  Filled 2017-06-17: qty 1

## 2017-06-17 MED ORDER — CEPHALEXIN 500 MG PO CAPS: 500.0000 mg | ORAL_CAPSULE | Freq: Four times a day (QID) | ORAL | 0 refills | Status: DC

## 2017-06-17 MED ORDER — DEXTROSE 5 % IV SOLN
1.0000 g | Freq: Once | INTRAVENOUS | Status: AC
  Administered 2017-06-17: 1 g via INTRAVENOUS
  Filled 2017-06-17: qty 10

## 2017-06-17 MED ORDER — SODIUM CHLORIDE 0.9 % IV BOLUS (SEPSIS)
1000.0000 mL | Freq: Once | INTRAVENOUS | Status: AC
  Administered 2017-06-17: 1000 mL via INTRAVENOUS

## 2017-06-17 NOTE — Discharge Instructions (Signed)
Take Keflex as prescribed until finished. We recommend Pyridium and Tylenol for pain control. Drink plenty of water to prevent dehydration and to help clear infection. Follow-up with your primary care doctor to ensure resolution of symptoms.

## 2017-06-19 LAB — URINE CULTURE

## 2017-06-20 ENCOUNTER — Telehealth: Payer: Self-pay | Admitting: Emergency Medicine

## 2017-06-20 ENCOUNTER — Other Ambulatory Visit: Payer: Self-pay | Admitting: Physician Assistant

## 2017-06-20 NOTE — Telephone Encounter (Signed)
Post ED Visit - Positive Culture Follow-up  Culture report reviewed by antimicrobial stewardship pharmacist:  []  Elenor Quinones, Pharm.D. [x]  Heide Guile, Pharm.D., BCPS AQ-ID []  Parks Neptune, Pharm.D., BCPS []  Alycia Rossetti, Pharm.D., BCPS []  [place], Pharm.D., BCPS, AAHIVP []  Legrand Como, Pharm.D., BCPS, AAHIVP []  Salome Arnt, PharmD, BCPS []  Dimitri Ped, PharmD, BCPS []  Vincenza Hews, PharmD, BCPS  Positive urine culture Treated with cephalexin, organism sensitive to the same and no further patient follow-up is required at this time.  Hazle Nordmann 06/20/2017, 2:04 PM

## 2017-06-29 ENCOUNTER — Other Ambulatory Visit (INDEPENDENT_AMBULATORY_CARE_PROVIDER_SITE_OTHER): Payer: Medicare Other

## 2017-06-29 DIAGNOSIS — R8271 Bacteriuria: Secondary | ICD-10-CM | POA: Diagnosis not present

## 2017-06-29 NOTE — Progress Notes (Signed)
Pt reports she has finished her ABX & has no concerns at this time.

## 2017-06-30 LAB — URINE CULTURE
MICRO NUMBER: 81192027
Result:: NO GROWTH
SPECIMEN QUALITY: ADEQUATE

## 2017-06-30 LAB — URINALYSIS, ROUTINE W REFLEX MICROSCOPIC
BILIRUBIN URINE: NEGATIVE
GLUCOSE, UA: NEGATIVE
Hgb urine dipstick: NEGATIVE
Ketones, ur: NEGATIVE
LEUKOCYTES UA: NEGATIVE
Nitrite: NEGATIVE
PH: 6 (ref 5.0–8.0)
Protein, ur: NEGATIVE
Specific Gravity, Urine: 1.008 (ref 1.001–1.03)

## 2017-07-05 ENCOUNTER — Ambulatory Visit
Admission: RE | Admit: 2017-07-05 | Discharge: 2017-07-05 | Disposition: A | Discharge status: Home / Self Care | Payer: Medicare Other | Source: Ambulatory Visit | Attending: Otolaryngology | Admitting: Otolaryngology

## 2017-07-05 DIAGNOSIS — C73 Malignant neoplasm of thyroid gland: Secondary | ICD-10-CM | POA: Diagnosis not present

## 2017-07-05 DIAGNOSIS — Z85850 Personal history of malignant neoplasm of thyroid: Secondary | ICD-10-CM

## 2017-07-07 DIAGNOSIS — M47817 Spondylosis without myelopathy or radiculopathy, lumbosacral region: Secondary | ICD-10-CM | POA: Diagnosis not present

## 2017-07-14 DIAGNOSIS — M797 Fibromyalgia: Secondary | ICD-10-CM | POA: Diagnosis not present

## 2017-07-14 DIAGNOSIS — M25539 Pain in unspecified wrist: Secondary | ICD-10-CM | POA: Diagnosis not present

## 2017-07-14 DIAGNOSIS — Z79899 Other long term (current) drug therapy: Secondary | ICD-10-CM | POA: Diagnosis not present

## 2017-07-14 DIAGNOSIS — M0609 Rheumatoid arthritis without rheumatoid factor, multiple sites: Secondary | ICD-10-CM | POA: Diagnosis not present

## 2017-08-02 ENCOUNTER — Ambulatory Visit (INDEPENDENT_AMBULATORY_CARE_PROVIDER_SITE_OTHER): Payer: Medicare Other | Admitting: Internal Medicine

## 2017-08-02 VITALS — BP 114/68 | HR 76 | Temp 97.5°F | Resp 16 | Ht 63.5 in | Wt 208.6 lb

## 2017-08-02 DIAGNOSIS — R7303 Prediabetes: Secondary | ICD-10-CM | POA: Diagnosis not present

## 2017-08-02 DIAGNOSIS — Z23 Encounter for immunization: Secondary | ICD-10-CM | POA: Diagnosis not present

## 2017-08-02 DIAGNOSIS — R7309 Other abnormal glucose: Secondary | ICD-10-CM | POA: Diagnosis not present

## 2017-08-02 DIAGNOSIS — Z79899 Other long term (current) drug therapy: Secondary | ICD-10-CM | POA: Diagnosis not present

## 2017-08-02 DIAGNOSIS — I10 Essential (primary) hypertension: Secondary | ICD-10-CM | POA: Diagnosis not present

## 2017-08-02 DIAGNOSIS — E039 Hypothyroidism, unspecified: Secondary | ICD-10-CM

## 2017-08-02 DIAGNOSIS — M069 Rheumatoid arthritis, unspecified: Secondary | ICD-10-CM

## 2017-08-02 DIAGNOSIS — E782 Mixed hyperlipidemia: Secondary | ICD-10-CM

## 2017-08-02 DIAGNOSIS — E559 Vitamin D deficiency, unspecified: Secondary | ICD-10-CM

## 2017-08-02 NOTE — Patient Instructions (Signed)

## 2017-08-02 NOTE — Progress Notes (Signed)
This very nice 56 y.o. MWF presents for 6 month follow up with Hypertension, Hyperlipidemia, Pre-Diabetes and Vitamin D Deficiency. Patient has Rheumatoid Arthritis (2009) followed by Dr Dossie Der.      Patient is treated for HTN (2000) & BP has been controlled at home. Today's BP is at goal - 114/68. Patient has had no complaints of any cardiac type chest pain, palpitations, dyspnea / orthopnea / PND, dizziness, claudication, or dependent edema.     Hyperlipidemia is not controlled with diet & she alleges intolerance to both Statins & Zetia.  Last Lipids were not at goal: Lab Results  Component Value Date   CHOL 236 (H) 06/02/2017   HDL 63 06/02/2017   LDLCALC 206 (H) 01/28/2017   TRIG 165 (H) 06/02/2017   CHOLHDL 3.7 06/02/2017      Also, the patient has Morbid Obesity (BMI 33+) and  PreDiabetes (A1c 5.9% / 2010) and has had no symptoms of reactive hypoglycemia, diabetic polys, paresthesias or visual blurring.  Last A1c was normal & at goal: Lab Results  Component Value Date   HGBA1C 5.4 08/02/2017      Further, the patient also has history of Vitamin D Deficiency ("27"/2009 and "37"/2016) and supplements vitamin D without any suspected side-effects. Last vitamin D was improved (goal 70-100): Lab Results  Component Value Date   VD25OH 58 08/02/2017   Current Outpatient Medications on File Prior to Visit  Medication Sig  . Abatacept 125 MG/ML SOSY Inject 1 Syringe into the skin every 7 (seven) days.  Marland Kitchen acetaminophen (TYLENOL) 500 MG tablet Take 1 tablet (500 mg total) by mouth every 6 (six) hours as needed.  . Acetylcysteine (N-ACETYL-L-CYSTEINE PO) Take 1 capsule by mouth daily.  Marland Kitchen albuterol (PROAIR HFA) 108 (90 Base) MCG/ACT inhaler Inhale 1-2 puffs into the lungs every 6 (six) hours as needed for wheezing or shortness of breath. Reported on 03/12/2016  . albuterol (PROVENTIL) (2.5 MG/3ML) 0.083% nebulizer solution Take 3 mLs (2.5 mg total) by nebulization every 6 (six) hours as  needed for wheezing or shortness of breath.  Marland Kitchen amitriptyline (ELAVIL) 25 MG tablet Take 75 mg by mouth at bedtime.   Marland Kitchen aspirin EC 81 MG tablet Take 81 mg by mouth daily.  Marland Kitchen atorvastatin (LIPITOR) 80 MG tablet Take 40 mg by mouth 3 (three) times a week.  . AVENOVA 0.01 % SOLN Place 1 application into both eyes 2 (two) times daily. Apply to eyelids BID  . buprenorphine (BUTRANS) 10 MCG/HR PTWK patch Place 10 mcg onto the skin once a week.  . Calcium Citrate-Vitamin D 250-200 MG-UNIT TABS Take 1 tablet by mouth daily.  . cephALEXin (KEFLEX) 500 MG capsule Take 1 capsule (500 mg total) by mouth 4 (four) times daily.  . Cholecalciferol (VITAMIN D3) 5000 UNITS TABS Take 5,000 Units by mouth daily.   . cyclobenzaprine (FLEXERIL) 10 MG tablet TAKE 1 TABLET(10 MG) BY MOUTH AT BEDTIME  . furosemide (LASIX) 40 MG tablet TAKE 1 TABLET BY MOUTH THREE TIMES DAILY FOR FLUID RETENTION (Patient taking differently: TAKE 1 TABLET BY MOUTH daily      takes as needed)  . L-GLUTAMINE PO Take 1 capsule by mouth daily.  Marland Kitchen l-methylfolate-B6-B12 (METANX) 3-35-2 MG TABS tablet Take 1 tablet by mouth daily.  Marland Kitchen levothyroxine (SYNTHROID, LEVOTHROID) 150 MCG tablet TAKE 1 TABLET BY MOUTH EVERY DAY  . LOTEMAX 0.5 % GEL Place 1 drop into both eyes 3 (three) times daily.   . Magnesium Oxide 500  MG TABS Take 500 mg by mouth daily.  . Menaquinone-7 (VITAMIN K2) 100 MCG CAPS Take 1 capsule by mouth daily.  . methocarbamol (ROBAXIN) 750 MG tablet Take 1 tablet (750 mg total) by mouth 4 (four) times daily.  Marland Kitchen MILK THISTLE EXTRACT PO Take 2 capsules by mouth daily.  . Omega-3 Fatty Acids (FISH OIL) 1200 MG CAPS Take 2 capsules by mouth daily.   Marland Kitchen oxyCODONE-acetaminophen (PERCOCET) 7.5-325 MG tablet Take 1 tablet by mouth every 4 (four) hours as needed for severe pain.  Vladimir Faster Glycol-Propyl Glycol (SYSTANE) 0.4-0.3 % SOLN Place 1 drop into both eyes 4 (four) times daily.   . RESTASIS 0.05 % ophthalmic emulsion Place 1 drop into  both eyes daily.   . TURMERIC CURCUMIN PO Take 900 mg elemental calcium/kg/hr by mouth 2 (two) times daily.  Marland Kitchen FEMRING 0.1 MG/24HR RING INSERT 1 RING VAGINALLY EVERY 3 MONTHS (Patient not taking: Reported on 08/02/2017)   No current facility-administered medications on file prior to visit.    Allergies  Allergen Reactions  . Ciprofloxacin Hives and Swelling    SWELLING REACTION UNSPECIFIED   . Levaquin [Levofloxacin] Hives  . Lyrica [Pregabalin] Other (See Comments)    Altered mental status ALTERED MENTAL STATUS HALLUCINATIONS  . Compazine [Prochlorperazine Edisylate]     UNSPECIFIED REACTION   . Hydrocodone Itching  . Morphine And Related Itching  . Penicillins Itching  . Sulfa Antibiotics Itching   PMHx:   Past Medical History:  Diagnosis Date  . Arthritis    OA, RA  . Asthma    with a cold  . Autoimmune deficiency syndrome ([place])   . Back pain   . Cancer ([place])    thyroid-1995  . Family history of adverse reaction to anesthesia    mother problems with n/v  . Fibromyalgia   . GERD (gastroesophageal reflux disease)   . Hepatitis    'A" when pt. was 12 yrs. old  . Hiatal hernia   . Hyperlipidemia   . Hypertension   . Hypothyroidism   . Pneumonia    Immunization History  Administered Date(s) Administered  . DTaP 09/15/2012, 09/15/2012  . Influenza Inj Mdck Quad With Preservative 08/02/2017  . Influenza Whole 09/06/2012  . Influenza-Unspecified 05/07/2014, 05/20/2016  . Pneumococcal Conjugate-13 09/15/2012, 07/05/2014  . Zoster 09/03/2013   Past Surgical History:  Procedure Laterality Date  . ANTERIOR CERVICAL DECOMP/DISCECTOMY FUSION N/A 05/30/2017   Procedure: Cervical five-six, Cervical six-seven Anterior discectomy with fusion and plate fixation;  Surgeon: Ditty, Kevan Ny, MD;  Location: [facility];  Service: Neurosurgery;  Laterality: N/A;  . ANTERIOR LAT LUMBAR FUSION Left 01/26/2013   Procedure: ANTERIOR LATERAL LUMBAR FUSION 1 LEVEL;  Surgeon: Faythe Ghee, MD;  Location: [facility];  Service: Neurosurgery;  Laterality: Left;  lumbar four-five  . KNEE ARTHROPLASTY Left 02/16/2016   Procedure: COMPUTER ASSISTED TOTAL KNEE ARTHROPLASTY;  Surgeon: Dereck Leep, MD;  Location: [facility];  Service: Orthopedics;  Laterality: Left;  . KNEE SURGERY     3 on right knee, 1 on left-prior to TKR  . LUMBAR PERCUTANEOUS PEDICLE SCREW 1 LEVEL Left 01/26/2013   Procedure: LUMBAR PERCUTANEOUS PEDICLE SCREW 1 LEVEL;  Surgeon: Faythe Ghee, MD;  Location: [facility];  Service: Neurosurgery;  Laterality: Left;  lumbar four-five  . PILONIDAL CYST EXCISION    . THYROIDECTOMY    . TONSILLECTOMY    . TOTAL KNEE ARTHROPLASTY     right  . UPPER GASTROINTESTINAL ENDOSCOPY    .  VAGINAL HYSTERECTOMY     FHx:    Reviewed / unchanged  SHx:    Reviewed / unchanged  Systems Review:  Constitutional: Denies fever, chills, wt changes, headaches, insomnia, fatigue, night sweats, change in appetite. Eyes: Denies redness, blurred vision, diplopia, discharge, itchy, watery eyes.  ENT: Denies discharge, congestion, post nasal drip, epistaxis, sore throat, earache, hearing loss, dental pain, tinnitus, vertigo, sinus pain, snoring.  CV: Denies chest pain, palpitations, irregular heartbeat, syncope, dyspnea, diaphoresis, orthopnea, PND, claudication or edema. Respiratory: denies cough, dyspnea, DOE, pleurisy, hoarseness, laryngitis, wheezing.  Gastrointestinal: Denies dysphagia, odynophagia, heartburn, reflux, water brash, abdominal pain or cramps, nausea, vomiting, bloating, diarrhea, constipation, hematemesis, melena, hematochezia  or hemorrhoids. Genitourinary: Denies dysuria, frequency, urgency, nocturia, hesitancy, discharge, hematuria or flank pain. Musculoskeletal: Denies arthralgias, myalgias, stiffness, jt. swelling, pain, limping or strain/sprain.  Skin: Denies pruritus, rash, hives, warts, acne, eczema or change in skin lesion(s). Neuro: No weakness, tremor,  incoordination, spasms, paresthesia or pain. Psychiatric: Denies confusion, memory loss or sensory loss. Endo: Denies change in weight, skin or hair change.  Heme/Lymph: No excessive bleeding, bruising or enlarged lymph nodes.  Physical Exam  BP 114/68   Pulse 76   Temp (!) 97.5 F (36.4 C)   Resp 16   Ht 5' 3.5" (1.613 m)   Wt 208 lb 9.6 oz (94.6 kg)   LMP  (LMP Unknown)   BMI 36.37 kg/m   Appears over nourished, well groomed  and in no distress.  Eyes: PERRLA, EOMs, conjunctiva no swelling or erythema. Sinuses: No frontal/maxillary tenderness ENT/Mouth: EAC's clear, TM's nl w/o erythema, bulging. Nares clear w/o erythema, swelling, exudates. Oropharynx clear without erythema or exudates. Oral hygiene is good. Tongue normal, non obstructing. Hearing intact.  Neck: Supple. Thyroid nl. Car 2+/2+ without bruits, nodes or JVD. Chest: Respirations nl with BS clear & equal w/o rales, rhonchi, wheezing or stridor.  Cor: Heart sounds normal w/ regular rate and rhythm without sig. murmurs, gallops, clicks or rubs. Peripheral pulses normal and equal  without edema.  Abdomen: Soft & bowel sounds normal. Non-tender w/o guarding, rebound, hernias, masses or organomegaly.  Lymphatics: Unremarkable.  Musculoskeletal: Full ROM all peripheral extremities with early rheumatoid changed of the fingers. Normal gait.  Skin: Warm, dry without exposed rashes, lesions or ecchymosis apparent.  Neuro: Cranial nerves intact, reflexes equal bilaterally. Sensory-motor testing grossly intact. Tendon reflexes grossly intact.  Pysch: Alert & oriented x 3.  Insight and judgement nl & appropriate. No ideations.  Assessment and Plan:  1. Essential hypertension  - Continue medication, monitor blood pressure at home.  - Continue DASH diet. Reminder to go to the ER if any CP,  SOB, nausea, dizziness, severe HA, changes vision/speech.  - CBC with Differential/Platelet - BASIC METABOLIC PANEL WITH GFR -  Magnesium - TSH  2. Hyperlipidemia  - Continue diet/meds, exercise,& lifestyle modifications.  - Continue monitor periodic cholesterol/liver & renal functions   - Hepatic function panel - TSH  3. Prediabetes  - Continue diet, exercise, lifestyle modifications.  - Monitor appropriate labs.  - Hemoglobin A1c - Insulin, random  4. Vitamin D deficiency  - Continue supplementation. - VITAMIN D 25 Hydroxy  5. Abnormal glucose  - Hemoglobin A1c - Insulin, random  6. Hypothyroidism  - TSH  7. Rheumatoid arthritis involving multiple sites, unspecified rheumatoid factor presence (HCC)  - CBC with Differential/Platelet - BASIC METABOLIC PANEL WITH GFR - Hepatic function panel  8. Need for immunization against influenza  - FLU VACCINE MDCK QUAD  W/Preservative  9. Medication management  - CBC with Differential/Platelet - BASIC METABOLIC PANEL WITH GFR - Hepatic function panel - Magnesium - TSH - Hemoglobin A1c - Insulin, random - VITAMIN D 25 Hydroxy       Discussed  regular exercise, BP monitoring, weight control to achieve/maintain BMI less than 25 and discussed med and SE's. Recommended labs to assess and monitor clinical status with further disposition pending results of labs. Over 30 minutes of exam, counseling, chart review was performed.

## 2017-08-03 ENCOUNTER — Encounter (INDEPENDENT_AMBULATORY_CARE_PROVIDER_SITE_OTHER): Payer: Self-pay

## 2017-08-03 LAB — HEMOGLOBIN A1C
EAG (MMOL/L): 6 (calc)
Hgb A1c MFr Bld: 5.4 % of total Hgb (ref <5.7)
MEAN PLASMA GLUCOSE: 108 (calc)

## 2017-08-03 LAB — HEPATIC FUNCTION PANEL
AG Ratio: 1.6 (calc) (ref 1.0–2.5)
ALBUMIN MSPROF: 4.3 g/dL (ref 3.6–5.1)
ALT: 23 U/L (ref 6–29)
AST: 20 U/L (ref 10–35)
Alkaline phosphatase (APISO): 73 U/L (ref 33–130)
BILIRUBIN DIRECT: 0.1 mg/dL (ref 0.0–0.2)
BILIRUBIN TOTAL: 0.4 mg/dL (ref 0.2–1.2)
GLOBULIN: 2.7 g/dL (ref 1.9–3.7)
Indirect Bilirubin: 0.3 mg/dL (calc) (ref 0.2–1.2)
Total Protein: 7 g/dL (ref 6.1–8.1)

## 2017-08-03 LAB — CBC WITH DIFFERENTIAL/PLATELET
BASOS PCT: 0.3 %
Basophils Absolute: 18 cells/uL (ref 0–200)
Eosinophils Absolute: 42 cells/uL (ref 15–500)
Eosinophils Relative: 0.7 %
HEMATOCRIT: 40.1 % (ref 35.0–45.0)
Hemoglobin: 13.5 g/dL (ref 11.7–15.5)
LYMPHS ABS: 3228 {cells}/uL (ref 850–3900)
MCH: 28.8 pg (ref 27.0–33.0)
MCHC: 33.7 g/dL (ref 32.0–36.0)
MCV: 85.5 fL (ref 80.0–100.0)
MPV: 9.4 fL (ref 7.5–12.5)
Monocytes Relative: 5.5 %
NEUTROS PCT: 39.7 %
Neutro Abs: 2382 cells/uL (ref 1500–7800)
Platelets: 319 10*3/uL (ref 140–400)
RBC: 4.69 10*6/uL (ref 3.80–5.10)
RDW: 13.5 % (ref 11.0–15.0)
Total Lymphocyte: 53.8 %
WBC: 6 10*3/uL (ref 3.8–10.8)
WBCMIX: 330 {cells}/uL (ref 200–950)

## 2017-08-03 LAB — MAGNESIUM: Magnesium: 1.9 mg/dL (ref 1.5–2.5)

## 2017-08-03 LAB — BASIC METABOLIC PANEL WITH GFR
BUN: 14 mg/dL (ref 7–25)
CALCIUM: 9.6 mg/dL (ref 8.6–10.4)
CO2: 29 mmol/L (ref 20–32)
Chloride: 101 mmol/L (ref 98–110)
Creat: 0.72 mg/dL (ref 0.50–1.05)
GFR, EST AFRICAN AMERICAN: 108 mL/min/{1.73_m2} (ref >=60)
GFR, EST NON AFRICAN AMERICAN: 94 mL/min/{1.73_m2} (ref >=60)
Glucose, Bld: 88 mg/dL (ref 65–99)
Potassium: 4.5 mmol/L (ref 3.5–5.3)
Sodium: 138 mmol/L (ref 135–146)

## 2017-08-03 LAB — TSH: TSH: 0.61 mIU/L (ref 0.40–4.50)

## 2017-08-03 LAB — VITAMIN D 25 HYDROXY (VIT D DEFICIENCY, FRACTURES): Vit D, 25-Hydroxy: 58 ng/mL (ref 30–100)

## 2017-08-03 LAB — INSULIN, RANDOM: Insulin: 7.1 u[IU]/mL (ref 2.0–19.6)

## 2017-08-07 ENCOUNTER — Encounter: Payer: Self-pay | Admitting: Internal Medicine

## 2017-08-08 DIAGNOSIS — M47817 Spondylosis without myelopathy or radiculopathy, lumbosacral region: Secondary | ICD-10-CM | POA: Diagnosis not present

## 2017-08-09 DIAGNOSIS — R7301 Impaired fasting glucose: Secondary | ICD-10-CM | POA: Diagnosis not present

## 2017-08-09 DIAGNOSIS — C73 Malignant neoplasm of thyroid gland: Secondary | ICD-10-CM | POA: Diagnosis not present

## 2017-08-09 DIAGNOSIS — E890 Postprocedural hypothyroidism: Secondary | ICD-10-CM | POA: Diagnosis not present

## 2017-08-10 ENCOUNTER — Other Ambulatory Visit: Payer: Self-pay | Admitting: Endocrinology

## 2017-08-10 DIAGNOSIS — C73 Malignant neoplasm of thyroid gland: Secondary | ICD-10-CM

## 2017-08-25 DIAGNOSIS — M4722 Other spondylosis with radiculopathy, cervical region: Secondary | ICD-10-CM | POA: Diagnosis not present

## 2017-09-02 ENCOUNTER — Telehealth: Payer: Self-pay | Admitting: Physician Assistant

## 2017-09-02 MED ORDER — BENZONATATE 100 MG PO CAPS: 100.0000 mg | ORAL_CAPSULE | Freq: Three times a day (TID) | ORAL | 0 refills | Status: DC | PRN

## 2017-09-02 MED ORDER — PREDNISONE 20 MG PO TABS: ORAL_TABLET | ORAL | 0 refills | Status: DC

## 2017-09-02 MED ORDER — AZITHROMYCIN 250 MG PO TABS: ORAL_TABLET | ORAL | 1 refills | Status: DC

## 2017-09-02 NOTE — Telephone Encounter (Signed)
56 y.o. WF with history of RA on DMARD calls with sore throat, cough, without mucus x 3 days, has tried nothing.   Will send in prednisone, can wait to take antibiotic after 4-5 days but will send in since patient is on DMARD.  Get on allergy pill and will send in tessalon pills for cough.  If not better or getting worse call for an office visit or go to the ER.

## 2017-09-09 DIAGNOSIS — M47817 Spondylosis without myelopathy or radiculopathy, lumbosacral region: Secondary | ICD-10-CM | POA: Diagnosis not present

## 2017-09-16 DIAGNOSIS — M47817 Spondylosis without myelopathy or radiculopathy, lumbosacral region: Secondary | ICD-10-CM | POA: Diagnosis not present

## 2017-09-24 ENCOUNTER — Other Ambulatory Visit: Payer: Self-pay | Admitting: Physician Assistant

## 2017-10-28 DIAGNOSIS — Z79899 Other long term (current) drug therapy: Secondary | ICD-10-CM | POA: Diagnosis not present

## 2017-10-28 DIAGNOSIS — E890 Postprocedural hypothyroidism: Secondary | ICD-10-CM | POA: Diagnosis not present

## 2017-10-28 DIAGNOSIS — M0609 Rheumatoid arthritis without rheumatoid factor, multiple sites: Secondary | ICD-10-CM | POA: Diagnosis not present

## 2017-11-08 NOTE — Progress Notes (Signed)
FOLLOW UP  Assessment and Plan:   Hypertension Well controlled with current medications  Monitor blood pressure at home; patient to call if consistently greater than 130/80 Continue DASH diet.   Reminder to go to the ER if any CP, SOB, nausea, dizziness, severe HA, changes vision/speech, left arm numbness and tingling and jaw pain.  Cholesterol Currently above goal; has not tolerated higher dose in the past -  Continue low cholesterol diet and exercise.  Check lipid panel.   Other abnormal glucose Recent A1Cs at goal Continue diet and exercise.  Perform daily foot/skin check, notify office of any concerning changes.  Defer A1C; check BMP  Obesity with co morbidities Long discussion about weight loss, diet, and exercise Recommended diet heavy in fruits and veggies and low in animal meats, cheeses, and dairy products, appropriate calorie intake Discussed ideal weight for height - wt goal for this year is 140-145 lb Will follow up in 3 months  Hypothyroidism continue medications reminded to take on an empty stomach 30-53mins before food.  Defer TSH had checked by endocrinology recently   Vitamin D Def At goal at last visit; continue supplementation to maintain goal of 70-100 Defer Vit D level  Insect bite/cellulitis Of abdomen; doxycycline sent in   Continue diet and meds as discussed. Further disposition pending results of labs. Discussed med's effects and SE's.   Over 30 minutes of exam, counseling, chart review, and critical decision making was performed.   Future Appointments  Date Time Provider [place]  02/14/2018  9:00 AM Unk Pinto, MD [facility] None    ----------------------------------------------------------------------------------------------------------------------  HPI 57 y.o. female  presents for 3 month follow up on hypertension, cholesterol, glucose management, morbid obesity and vitamin D deficiency.  BMI is Body mass index is 34.87  kg/m., she has been working on diet and exercise. [facility], following very low carb but with vegetables.  Wt Readings from Last 3 Encounters:  11/09/17 200 lb (90.7 kg)  08/02/17 208 lb 9.6 oz (94.6 kg)  06/16/17 197 lb (89.4 kg)   Her blood pressure has been controlled at home, today their BP is BP: 112/72  She does workout. She denies chest pain, shortness of breath, dizziness.   She is on cholesterol medication (atorvastatin 40 mg three times weekly) and denies myalgias. Her cholesterol is not at goal. The cholesterol last visit was:   Lab Results  Component Value Date   CHOL 236 (H) 06/02/2017   HDL 63 06/02/2017   LDLCALC 206 (H) 01/28/2017   TRIG 165 (H) 06/02/2017   CHOLHDL 3.7 06/02/2017    She has been working on diet and exercise for glucose management, and denies increased appetite, nausea, paresthesia of the feet, polydipsia, polyuria, visual disturbances, vomiting and weight loss. Last A1C in the office was:  Lab Results  Component Value Date   HGBA1C 5.4 08/02/2017   She is on thyroid medication. Her medication was not changed last visit.  She just had her thyroid checked by endocrinology Dr. Chalmers Cater on 2/22 - was 0.916 Lab Results  Component Value Date   TSH 0.61 08/02/2017   Patient is on Vitamin D supplement but below goal at last check:   Lab Results  Component Value Date   VD25OH 58 08/02/2017        Current Medications:  Current Outpatient Medications on File Prior to Visit  Medication Sig  . Abatacept 125 MG/ML SOSY Inject 1 Syringe into the skin every 7 (seven) days.  Marland Kitchen  acetaminophen (TYLENOL) 500 MG tablet Take 1 tablet (500 mg total) by mouth every 6 (six) hours as needed.  . Acetylcysteine (N-ACETYL-L-CYSTEINE PO) Take 1 capsule by mouth daily.  Marland Kitchen albuterol (PROAIR HFA) 108 (90 Base) MCG/ACT inhaler Inhale 1-2 puffs into the lungs every 6 (six) hours as needed for wheezing or shortness of breath. Reported on 03/12/2016  . albuterol  (PROVENTIL) (2.5 MG/3ML) 0.083% nebulizer solution Take 3 mLs (2.5 mg total) by nebulization every 6 (six) hours as needed for wheezing or shortness of breath.  Marland Kitchen amitriptyline (ELAVIL) 25 MG tablet Take 25 mg by mouth at bedtime.   Marland Kitchen aspirin EC 81 MG tablet Take 81 mg by mouth daily.  Marland Kitchen atorvastatin (LIPITOR) 80 MG tablet Take 40 mg by mouth 3 (three) times a week.  . AVENOVA 0.01 % SOLN Place 1 application into both eyes 2 (two) times daily. Apply to eyelids BID  . buprenorphine (BUTRANS) 10 MCG/HR PTWK patch Place 10 mcg onto the skin once a week.  . Calcium Citrate-Vitamin D 250-200 MG-UNIT TABS Take 1 tablet by mouth daily.  . cephALEXin (KEFLEX) 500 MG capsule Take 1 capsule (500 mg total) by mouth 4 (four) times daily.  . Cholecalciferol (VITAMIN D3) 5000 UNITS TABS Take 5,000 Units by mouth daily.   . cyclobenzaprine (FLEXERIL) 10 MG tablet TAKE 1 TABLET(10 MG) BY MOUTH AT BEDTIME  . FEMRING 0.1 MG/24HR RING INSERT 1 RING VAGINALLY EVERY 3 MONTHS  . furosemide (LASIX) 40 MG tablet TAKE 1 TABLET BY MOUTH THREE TIMES DAILY FOR FLUID RETENTION (Patient taking differently: TAKE 1 TABLET BY MOUTH daily      takes as needed)  . L-GLUTAMINE PO Take 1 capsule by mouth daily.  Marland Kitchen l-methylfolate-B6-B12 (METANX) 3-35-2 MG TABS tablet Take 1 tablet by mouth daily.  Marland Kitchen levothyroxine (SYNTHROID, LEVOTHROID) 150 MCG tablet TAKE 1 TABLET BY MOUTH EVERY DAY  . LOTEMAX 0.5 % GEL Place 1 drop into both eyes 3 (three) times daily.   . Magnesium Oxide 500 MG TABS Take 500 mg by mouth daily.  . Menaquinone-7 (VITAMIN K2) 100 MCG CAPS Take 1 capsule by mouth daily.  . methocarbamol (ROBAXIN) 750 MG tablet Take 1 tablet (750 mg total) by mouth 4 (four) times daily. (Patient taking differently: Take 750 mg by mouth as needed. )  . MILK THISTLE EXTRACT PO Take 2 capsules by mouth daily.  . Omega-3 Fatty Acids (FISH OIL) 1200 MG CAPS Take 2 capsules by mouth daily.   Marland Kitchen oxyCODONE-acetaminophen (PERCOCET) 7.5-325 MG  tablet Take 1 tablet by mouth every 4 (four) hours as needed for severe pain.  Vladimir Faster Glycol-Propyl Glycol (SYSTANE) 0.4-0.3 % SOLN Place 1 drop into both eyes 4 (four) times daily.   . RESTASIS 0.05 % ophthalmic emulsion Place 1 drop into both eyes daily.   . TURMERIC CURCUMIN PO Take 900 mg elemental calcium/kg/hr by mouth 2 (two) times daily.  . benzonatate (TESSALON PERLES) 100 MG capsule Take 1 capsule (100 mg total) by mouth 3 (three) times daily as needed for cough (Max: 600mg  per day). (Patient not taking: Reported on 11/09/2017)  . predniSONE (DELTASONE) 20 MG tablet 2 tablets daily for 3 days, 1 tablet daily for 4 days. (Patient not taking: Reported on 11/09/2017)   No current facility-administered medications on file prior to visit.      Allergies:  Allergies  Allergen Reactions  . Ciprofloxacin Hives and Swelling    SWELLING REACTION UNSPECIFIED   . Levaquin [Levofloxacin] Hives  .  Lyrica [Pregabalin] Other (See Comments)    Altered mental status ALTERED MENTAL STATUS HALLUCINATIONS  . Compazine [Prochlorperazine Edisylate]     UNSPECIFIED REACTION   . Hydrocodone Itching  . Morphine And Related Itching  . Penicillins Itching  . Sulfa Antibiotics Itching     Medical History:  Past Medical History:  Diagnosis Date  . Arthritis    OA, RA  . Asthma    with a cold  . Autoimmune deficiency syndrome ([place])   . Back pain   . Cancer ([place])    thyroid-1995  . Family history of adverse reaction to anesthesia    mother problems with n/v  . Fibromyalgia   . GERD (gastroesophageal reflux disease)   . Hepatitis    'A" when pt. was 12 yrs. old  . Hiatal hernia   . Hyperlipidemia   . Hypertension   . Hypothyroidism   . Pneumonia    Family history- Reviewed and unchanged Social history- Reviewed and unchanged   Review of Systems:  Review of Systems  Constitutional: Negative for malaise/fatigue and weight loss.  HENT: Negative for hearing loss and tinnitus.    Eyes: Negative for blurred vision and double vision.  Respiratory: Negative for cough, shortness of breath and wheezing.   Cardiovascular: Negative for chest pain, palpitations, orthopnea, claudication and leg swelling.  Gastrointestinal: Negative for abdominal pain, blood in stool, constipation, diarrhea, heartburn, melena, nausea and vomiting.  Genitourinary: Negative.   Musculoskeletal: Negative for joint pain and myalgias.  Skin: Negative for rash.  Neurological: Negative for dizziness, tingling, sensory change, weakness and headaches.  Endo/Heme/Allergies: Negative for polydipsia.  Psychiatric/Behavioral: Negative.   All other systems reviewed and are negative.     Physical Exam: BP 112/72   Pulse 91   Temp (!) 97.3 F (36.3 C)   Ht 5' 3.5" (1.613 m)   Wt 200 lb (90.7 kg)   LMP  (LMP Unknown)   SpO2 96%   BMI 34.87 kg/m  Wt Readings from Last 3 Encounters:  11/09/17 200 lb (90.7 kg)  08/02/17 208 lb 9.6 oz (94.6 kg)  06/16/17 197 lb (89.4 kg)   General Appearance: Well nourished, in no apparent distress. Eyes: PERRLA, EOMs, conjunctiva no swelling or erythema Sinuses: No Frontal/maxillary tenderness ENT/Mouth: Ext aud canals clear, TMs without erythema, bulging. No erythema, swelling, or exudate on post pharynx.  Tonsils not swollen or erythematous. Hearing normal.  Neck: Supple, thyroid normal.  Respiratory: Respiratory effort normal, BS equal bilaterally without rales, rhonchi, wheezing or stridor.  Cardio: RRR with no MRGs. Brisk peripheral pulses without edema.  Abdomen: Soft, + BS.  Non tender, no guarding, rebound, hernias, masses. Lymphatics: Non tender without lymphadenopathy.  Musculoskeletal: Full ROM, 5/5 strength, Normal gait Skin: Warm, dry; reddened area with open center with scant purulent discharge; erythematous area approx 5 cm x 3 cm irregular -   Neuro: Cranial nerves intact. No cerebellar symptoms.  Psych: Awake and oriented X 3, normal affect,  Insight and Judgment appropriate.    Izora Ribas, NP 11:16 AM [facility]

## 2017-11-09 ENCOUNTER — Encounter: Payer: Self-pay | Admitting: Adult Health

## 2017-11-09 ENCOUNTER — Ambulatory Visit (INDEPENDENT_AMBULATORY_CARE_PROVIDER_SITE_OTHER): Payer: Medicare Other | Admitting: Adult Health

## 2017-11-09 VITALS — BP 112/72 | HR 91 | Temp 97.3°F | Ht 63.5 in | Wt 200.0 lb

## 2017-11-09 DIAGNOSIS — E890 Postprocedural hypothyroidism: Secondary | ICD-10-CM | POA: Diagnosis not present

## 2017-11-09 DIAGNOSIS — Z79899 Other long term (current) drug therapy: Secondary | ICD-10-CM

## 2017-11-09 DIAGNOSIS — R7309 Other abnormal glucose: Secondary | ICD-10-CM | POA: Diagnosis not present

## 2017-11-09 DIAGNOSIS — R7303 Prediabetes: Secondary | ICD-10-CM | POA: Diagnosis not present

## 2017-11-09 DIAGNOSIS — I10 Essential (primary) hypertension: Secondary | ICD-10-CM

## 2017-11-09 DIAGNOSIS — E559 Vitamin D deficiency, unspecified: Secondary | ICD-10-CM

## 2017-11-09 DIAGNOSIS — E782 Mixed hyperlipidemia: Secondary | ICD-10-CM | POA: Diagnosis not present

## 2017-11-09 DIAGNOSIS — W57XXXA Bitten or stung by nonvenomous insect and other nonvenomous arthropods, initial encounter: Secondary | ICD-10-CM

## 2017-11-09 MED ORDER — DOXYCYCLINE HYCLATE 100 MG PO CAPS: ORAL_CAPSULE | ORAL | 0 refills | Status: DC

## 2017-11-10 ENCOUNTER — Other Ambulatory Visit: Payer: Self-pay | Admitting: Adult Health

## 2017-11-10 DIAGNOSIS — E782 Mixed hyperlipidemia: Secondary | ICD-10-CM

## 2017-11-10 LAB — HEPATIC FUNCTION PANEL
AG RATIO: 1.7 (calc) (ref 1.0–2.5)
ALBUMIN MSPROF: 4.3 g/dL (ref 3.6–5.1)
ALT: 25 U/L (ref 6–29)
AST: 20 U/L (ref 10–35)
Alkaline phosphatase (APISO): 56 U/L (ref 33–130)
Bilirubin, Direct: 0.1 mg/dL (ref 0.0–0.2)
GLOBULIN: 2.5 g/dL (ref 1.9–3.7)
Indirect Bilirubin: 0.3 mg/dL (calc) (ref 0.2–1.2)
TOTAL PROTEIN: 6.8 g/dL (ref 6.1–8.1)
Total Bilirubin: 0.4 mg/dL (ref 0.2–1.2)

## 2017-11-10 LAB — LIPID PANEL
Cholesterol: 251 mg/dL — ABNORMAL HIGH
HDL: 69 mg/dL
LDL Cholesterol (Calc): 156 mg/dL — ABNORMAL HIGH
Non-HDL Cholesterol (Calc): 182 mg/dL — ABNORMAL HIGH
Total CHOL/HDL Ratio: 3.6 (calc)
Triglycerides: 133 mg/dL

## 2017-11-10 LAB — BASIC METABOLIC PANEL WITHOUT GFR
BUN: 9 mg/dL (ref 7–25)
CO2: 31 mmol/L (ref 20–32)
Calcium: 10.2 mg/dL (ref 8.6–10.4)
Chloride: 104 mmol/L (ref 98–110)
Creat: 0.62 mg/dL (ref 0.50–1.05)
GFR, Est African American: 117 mL/min/1.73m2
GFR, Est Non African American: 101 mL/min/1.73m2
Glucose, Bld: 95 mg/dL (ref 65–99)
Potassium: 4.9 mmol/L (ref 3.5–5.3)
Sodium: 140 mmol/L (ref 135–146)

## 2017-11-10 LAB — CBC WITH DIFFERENTIAL/PLATELET
BASOS PCT: 0.3 %
Basophils Absolute: 18 cells/uL (ref 0–200)
EOS ABS: 12 {cells}/uL — AB (ref 15–500)
EOS PCT: 0.2 %
HEMATOCRIT: 40.3 % (ref 35.0–45.0)
HEMOGLOBIN: 13.5 g/dL (ref 11.7–15.5)
LYMPHS ABS: 2464 {cells}/uL (ref 850–3900)
MCH: 28.5 pg (ref 27.0–33.0)
MCHC: 33.5 g/dL (ref 32.0–36.0)
MCV: 85.2 fL (ref 80.0–100.0)
MONOS PCT: 6.2 %
MPV: 10 fL (ref 7.5–12.5)
NEUTROS ABS: 3227 {cells}/uL (ref 1500–7800)
Neutrophils Relative %: 52.9 %
Platelets: 338 10*3/uL (ref 140–400)
RBC: 4.73 10*6/uL (ref 3.80–5.10)
RDW: 13.3 % (ref 11.0–15.0)
Total Lymphocyte: 40.4 %
WBC mixed population: 378 cells/uL (ref 200–950)
WBC: 6.1 10*3/uL (ref 3.8–10.8)

## 2017-11-10 LAB — VITAMIN D 25 HYDROXY (VIT D DEFICIENCY, FRACTURES): Vit D, 25-Hydroxy: 67 ng/mL (ref 30–100)

## 2017-11-10 MED ORDER — EZETIMIBE 10 MG PO TABS: ORAL_TABLET | ORAL | 1 refills | Status: DC

## 2017-11-23 ENCOUNTER — Other Ambulatory Visit: Payer: Self-pay

## 2017-11-24 MED ORDER — LEVOTHYROXINE SODIUM 125 MCG PO TABS: 125.0000 ug | ORAL_TABLET | Freq: Every day | ORAL | 11 refills | Status: DC

## 2017-11-24 MED ORDER — LIOTHYRONINE SODIUM 5 MCG PO TABS: 5.0000 ug | ORAL_TABLET | Freq: Every day | ORAL | 0 refills | Status: DC

## 2017-11-26 ENCOUNTER — Other Ambulatory Visit: Payer: Self-pay | Admitting: Adult Health

## 2017-11-26 ENCOUNTER — Other Ambulatory Visit: Payer: Self-pay | Admitting: Internal Medicine

## 2017-12-13 DIAGNOSIS — Z79899 Other long term (current) drug therapy: Secondary | ICD-10-CM | POA: Diagnosis not present

## 2017-12-13 DIAGNOSIS — R7301 Impaired fasting glucose: Secondary | ICD-10-CM | POA: Diagnosis not present

## 2017-12-13 DIAGNOSIS — E890 Postprocedural hypothyroidism: Secondary | ICD-10-CM | POA: Diagnosis not present

## 2017-12-20 DIAGNOSIS — L732 Hidradenitis suppurativa: Secondary | ICD-10-CM | POA: Diagnosis not present

## 2017-12-20 DIAGNOSIS — C73 Malignant neoplasm of thyroid gland: Secondary | ICD-10-CM | POA: Diagnosis not present

## 2017-12-20 DIAGNOSIS — R7301 Impaired fasting glucose: Secondary | ICD-10-CM | POA: Diagnosis not present

## 2017-12-20 DIAGNOSIS — E890 Postprocedural hypothyroidism: Secondary | ICD-10-CM | POA: Diagnosis not present

## 2017-12-28 DIAGNOSIS — N951 Menopausal and female climacteric states: Secondary | ICD-10-CM | POA: Diagnosis not present

## 2017-12-28 DIAGNOSIS — Z79890 Hormone replacement therapy (postmenopausal): Secondary | ICD-10-CM | POA: Diagnosis not present

## 2017-12-29 DIAGNOSIS — L732 Hidradenitis suppurativa: Secondary | ICD-10-CM | POA: Diagnosis not present

## 2017-12-29 DIAGNOSIS — L821 Other seborrheic keratosis: Secondary | ICD-10-CM | POA: Diagnosis not present

## 2017-12-29 DIAGNOSIS — D2371 Other benign neoplasm of skin of right lower limb, including hip: Secondary | ICD-10-CM | POA: Diagnosis not present

## 2018-01-11 DIAGNOSIS — M0609 Rheumatoid arthritis without rheumatoid factor, multiple sites: Secondary | ICD-10-CM | POA: Diagnosis not present

## 2018-01-11 DIAGNOSIS — Z79899 Other long term (current) drug therapy: Secondary | ICD-10-CM | POA: Diagnosis not present

## 2018-01-11 DIAGNOSIS — M797 Fibromyalgia: Secondary | ICD-10-CM | POA: Diagnosis not present

## 2018-01-11 DIAGNOSIS — M25539 Pain in unspecified wrist: Secondary | ICD-10-CM | POA: Diagnosis not present

## 2018-02-08 ENCOUNTER — Other Ambulatory Visit: Payer: Self-pay | Admitting: Internal Medicine

## 2018-02-08 DIAGNOSIS — Z1231 Encounter for screening mammogram for malignant neoplasm of breast: Secondary | ICD-10-CM

## 2018-02-09 ENCOUNTER — Ambulatory Visit
Admission: RE | Admit: 2018-02-09 | Discharge: 2018-02-09 | Disposition: A | Discharge status: Home / Self Care | Payer: Medicare Other | Source: Ambulatory Visit | Attending: Internal Medicine | Admitting: Internal Medicine

## 2018-02-09 ENCOUNTER — Ambulatory Visit: Payer: Medicare Other

## 2018-02-09 DIAGNOSIS — Z1231 Encounter for screening mammogram for malignant neoplasm of breast: Secondary | ICD-10-CM

## 2018-02-13 ENCOUNTER — Encounter: Payer: Self-pay | Admitting: Internal Medicine

## 2018-02-13 NOTE — Patient Instructions (Signed)

## 2018-02-13 NOTE — Progress Notes (Signed)
[facility] Unk Pinto, M.D.     Uvaldo Bristle. Silverio Lay, P.A.-C Liane Comber, [place] [address] [place], [place] [address] Telephone [phone] Telefax [phone] Annual Screening/Preventative Visit & Comprehensive Evaluation &  Examination     This very nice 56 y.o.  MWF presents for a Screening/Preventative Visit & comprehensive evaluation and management of multiple medical co-morbidities.  Patient has been followed for HTN, HLD, Prediabetes  and Vitamin D Deficiency. Patient is followed by Dr Dossie Der for Rheumatoid Arthritis 1st dx'd in 2009.       HTN predates circa 1980. Patient's BP has been controlled at home and patient denies any cardiac symptoms as chest pain, palpitations, shortness of breath, dizziness or ankle swelling. Today's BP is at goal -  114/82.      Patient's hyperlipidemia is not controlled with as she endorsed intolerance to both Statins & ezetamibe. Patient denies myalgias or other medication SE's. Last lipids were not at goal: Lab Results  Component Value Date   CHOL 251 (H) 11/09/2017   HDL 69 11/09/2017   LDLCALC 156 (H) 11/09/2017   TRIG 133 11/09/2017   CHOLHDL 3.6 11/09/2017      Patient has Morbid Obesity (BMI 33+) and prediabetes (A1c 5.9%/2010). Patient relates that she previously had  A fasting Insulin measured at 300 and was started on Metformin by Dr Chalmers Cater for Insulin Resistance. She  denies reactive hypoglycemic symptoms, visual blurring, diabetic polys, or paresthesias. Last A1c was Normal & at goal: Lab Results  Component Value Date   HGBA1C 5.4 08/02/2017      Finally, patient has history of Vitamin D Deficiency ("27"/2009 ans "37"/2016) and last Vitamin D was at goal: Lab Results  Component Value Date   VD25OH 67 11/09/2017   Current Outpatient Medications on File Prior to Visit  Medication Sig  . Abatacept 125 MG/ML SOSY Inject 1 Syringe into the skin every  7  days.  Marland Kitchen acetaminophen  500 MG tablet Take 1 tablet  every 6 (six) hours as needed.  . Acetylcysteine  Take 1 capsule by mouth daily.  Marland Kitchen albuterol HFA  inhaler 1-2 puffs  every 6  hours as needed for wheezing  . albuterol  0.083% nebulizer solution Take 3 mLs  by neb  every 6 (six) hours as needed   . amitriptyline  25 MG tablet Take 25 mg by mouth at bedtime.   . AVENOVA 0.01 % SOLN Place 1 application into both eyes 2 (two) times daily.   . buprenorphine ([place]) 10 mcg/hr patch Place 10 mcg onto the skin once a week.  Marland Kitchen VITAMIN D 5000 UNITS TABS Take 5,000 Units by mouth daily.   . cyclobenzaprine 10 MG tablet TAKE 1 TABLET(10 MG) BY MOUTH AT BEDTIME  . ezetimibe  10 MG tablet Take 1 tablet daily for Cholesterol  . FEMRING 0.1 MG/24HR RING INSERT 1 RING VAGINALLY EVERY 3 MONTHS  . Furosemide 40 MG tablet TAKE 1 TABLETdaily as needed)  . L-GLUTAMINE PO Take 1 capsule by mouth daily.  Marland Kitchen l-methylfolate-B6-B12 (METANX)  Take 1 tablet by mouth daily.  Marland Kitchen levothyroxine 125 MCG tablet Take 1 tablet (125 mcg total) by mouth daily.  Marland Kitchen liothyronine (CYTOMEL) 5 MCG tablet TAKE 1 TAB (5 MCG) DAILY  . LOTEMAX 0.5 % GEL Place 1 drop into both eyes 3 (three) times daily.   . Magnesium Oxide 500 MG TABS Take 500 mg by mouth daily.   Metformin 500 ER  Takes 1 tablet daily  . Menaquinone-7 (VIT K2) 100 MCG  Take 1 capsule  daily.  . methocarbamol750 MG tablet Take 1 tab 4 x/ daily as needed.   Marland Kitchen MILK THISTLE EXTRACT  Take 2 capsules by mouth daily.  . Omega-3 FISH OIL 1200 MG CAPS Take 2 capsules by mouth daily.   Marland Kitchen PERCOCET 7.5-325 MG tablet Take 1 tablet by mouth every 4 (four) hours as needed for severe pain.  Marland Kitchen SYSTANE 0.4-0.3 % SOLN Place 1 drop into both eyes 4 (four) times daily.   . RESTASIS 0.05 % ophth emulsion Place 1 drop into both eyes daily.   . TURMERIC CURCUMIN Take 900 mg elemental calcium/kg/hr by mouth 2 (two) times daily.  Marland Kitchen atorvastatin  80 MG tablet Take 40 mg by mouth 3 (three)  times a week.   Allergies  Allergen Reactions  . Ciprofloxacin Hives and Swelling    SWELLING REACTION UNSPECIFIED   . Levaquin [Levofloxacin] Hives  . Lyrica [Pregabalin] Other (See Comments)    Altered mental status ALTERED MENTAL STATUS HALLUCINATIONS  . Compazine [Prochlorperazine Edisylate]     UNSPECIFIED REACTION   . Hydrocodone Itching  . Morphine And Related Itching  . Penicillins Itching  . Sulfa Antibiotics Itching   Past Medical History:  Diagnosis Date  . Arthritis    OA, RA  . Asthma    with a cold  . Autoimmune deficiency syndrome ([place])   . Back pain   . Cancer ([place])    thyroid-1995  . Family history of adverse reaction to anesthesia    mother problems with n/v  . Fibromyalgia   . GERD (gastroesophageal reflux disease)   . Hepatitis    'A" when pt. was 12 yrs. old  . Hiatal hernia   . Hyperlipidemia   . Hypertension   . Hypothyroidism   . Pneumonia    Health Maintenance  Topic Date Due  . PAP SMEAR  04/03/1982  . INFLUENZA VACCINE  04/06/2018  . MAMMOGRAM  02/10/2020  . COLONOSCOPY  10/12/2021  . TETANUS/TDAP  09/15/2022  . Hepatitis C Screening  Completed  . HIV Screening  Completed   Immunization History  Administered Date(s) Administered  . DTaP 09/15/2012, 09/15/2012  . Influenza Inj Mdck Quad With Preservative 08/02/2017  . Influenza Whole 09/06/2012  . Influenza-Unspecified 05/07/2014, 05/20/2016  . Pneumococcal Conjugate-13 09/15/2012, 07/05/2014  . Zoster 09/03/2013   Last Colon - 10/13/2011 - Dr Fuller Plan - recc 10 yr f/u due feb 2023.   Last MGM - 02/09/2018  Past Surgical History:  Procedure Laterality Date  . ANTERIOR CERVICAL DECOMP/DISCECTOMY FUSION N/A 05/30/2017   Procedure: Cervical five-six, Cervical six-seven Anterior discectomy with fusion and plate fixation;  Surgeon: Ditty, Kevan Ny, MD;  Location: [facility];  Service: Neurosurgery;  Laterality: N/A;  . ANTERIOR LAT LUMBAR FUSION Left 01/26/2013   Procedure: ANTERIOR  LATERAL LUMBAR FUSION 1 LEVEL;  Surgeon: Faythe Ghee, MD;  Location: [facility];  Service: Neurosurgery;  Laterality: Left;  lumbar four-five  . KNEE ARTHROPLASTY Left 02/16/2016   Procedure: COMPUTER ASSISTED TOTAL KNEE ARTHROPLASTY;  Surgeon: Dereck Leep, MD;  Location: [facility];  Service: Orthopedics;  Laterality: Left;  . KNEE SURGERY     3 on right knee, 1 on left-prior to TKR  . LUMBAR PERCUTANEOUS PEDICLE SCREW 1 LEVEL Left 01/26/2013   Procedure: LUMBAR PERCUTANEOUS PEDICLE SCREW 1 LEVEL;  Surgeon: Faythe Ghee, MD;  Location: [facility];  Service: Neurosurgery;  Laterality:  Left;  lumbar four-five  . PILONIDAL CYST EXCISION    . THYROIDECTOMY    . TONSILLECTOMY    . TOTAL KNEE ARTHROPLASTY     right  . UPPER GASTROINTESTINAL ENDOSCOPY    . VAGINAL HYSTERECTOMY     Family History  Problem Relation Age of Onset  . Hypertension Father   . Stroke Father   . Diabetes Brother   . Asthma Brother   . Other Daughter        XED CONNECTIVE TISSUE  . Cancer Maternal Grandmother        BREAST  . Breast cancer Maternal Grandmother 28  . Colon cancer Neg Hx   . Esophageal cancer Neg Hx   . Stomach cancer Neg Hx   . Rectal cancer Neg Hx    Social History   Tobacco Use  . Smoking status: Former Smoker    Packs/day: 0.50    Years: 40.00    Pack years: 20.00    Types: Cigarettes    Last attempt to quit: 07/05/2015    Years since quitting: 2.6  . Smokeless tobacco: Never Used  Substance Use Topics  . Alcohol use: Yes    Alcohol/week: 0.0 oz    Comment: rare  . Drug use: No    ROS Constitutional: Denies fever, chills, weight loss/gain, headaches, insomnia,  night sweats, and change in appetite. Does c/o fatigue. Eyes: Denies redness, blurred vision, diplopia, discharge, itchy, watery eyes.  ENT: Denies discharge, congestion, post nasal drip, epistaxis, sore throat, earache, hearing loss, dental pain, Tinnitus, Vertigo, Sinus pain, snoring.  Cardio: Denies chest  pain, palpitations, irregular heartbeat, syncope, dyspnea, diaphoresis, orthopnea, PND, claudication, edema Respiratory: denies cough, dyspnea, DOE, pleurisy, hoarseness, laryngitis, wheezing.  Gastrointestinal: Denies dysphagia, heartburn, reflux, water brash, pain, cramps, nausea, vomiting, bloating, diarrhea, constipation, hematemesis, melena, hematochezia, jaundice, hemorrhoids Genitourinary: Denies dysuria, frequency, urgency, nocturia, hesitancy, discharge, hematuria, flank pain Breast: Breast lumps, nipple discharge, bleeding.  Musculoskeletal: Denies arthralgia, myalgia, stiffness, Jt. Swelling, pain, limp, and strain/sprain. Denies falls. Skin: Denies puritis, rash, hives, warts, acne, eczema, changing in skin lesion Neuro: No weakness, tremor, incoordination, spasms, paresthesia, pain Psychiatric: Denies confusion, memory loss, sensory loss. Denies Depression. Endocrine: Denies change in weight, skin, hair change, nocturia, and paresthesia, diabetic polys, visual blurring, hyper / hypo glycemic episodes.  Heme/Lymph: No excessive bleeding, bruising, enlarged lymph nodes.  Physical Exam  BP 114/82   Pulse 76   Temp (!) 97.5 F (36.4 C)   Resp 16   Ht 5' 2.5" (1.588 m)   Wt 197 lb (89.4 kg)   LMP  (LMP Unknown)   BMI 35.46 kg/m   General Appearance: Well nourished, well groomed and in no apparent distress.  Eyes: PERRLA, EOMs, conjunctiva no swelling or erythema, normal fundi and vessels. Sinuses: No frontal/maxillary tenderness ENT/Mouth: EACs patent / TMs  nl. Nares clear without erythema, swelling, mucoid exudates. Oral hygiene is good. No erythema, swelling, or exudate. Tongue normal, non-obstructing. Tonsils not swollen or erythematous. Hearing normal.  Neck: Supple, thyroid not palpable. No bruits, nodes or JVD. Respiratory: Respiratory effort normal.  BS equal and clear bilateral without rales, rhonci, wheezing or stridor. Cardio: Heart sounds are normal with regular  rate and rhythm and no murmurs, rubs or gallops. Peripheral pulses are normal and equal bilaterally without edema. No aortic or femoral bruits. Chest: symmetric with normal excursions and percussion. Breasts: Symmetric, without lumps, nipple discharge, retractions, or fibrocystic changes.  Abdomen: Flat, soft with bowel sounds active. Nontender, no guarding,  rebound, hernias, masses, or organomegaly.  Lymphatics: Non tender without lymphadenopathy.  Genitourinary:  Musculoskeletal: Full ROM all peripheral extremities, joint stability, 5/5 strength, and normal gait. Skin: Warm and dry without rashes, lesions, cyanosis, clubbing or  ecchymosis.  Neuro: Cranial nerves intact, reflexes equal bilaterally. Normal muscle tone, no cerebellar symptoms. Sensation intact.  Pysch: Alert and oriented X 3, normal affect, Insight and Judgment appropriate.   Assessment and Plan  1. Annual Preventative Screening Examination  2. Essential hypertension  - EKG 12-Lead - Urinalysis, Routine w reflex microscopic - Microalbumin / creatinine urine ratio - CBC with Differential/Platelet - COMPLETE METABOLIC PANEL WITH GFR - Magnesium - TSH  3. Hyperlipidemia, mixed  - EKG 12-Lead - Lipid panel - TSH  4. Abnormal glucose  - EKG 12-Lead - Insulin, random  5. Vitamin D deficiency  - VITAMIN D 25 Hydroxyl  6. Prediabetes  - EKG 12-Lead - Insulin, random  7. Hypothyroidism  - TSH  8. Rheumatoid arthritis involving multiple sites  ([place])  9. Screening for colorectal cancer  - POC Hemoccult Bld/Stl  10. Screening for ischemic heart disease  11. Former smoker  - EKG 12-Lead  12. Family history of cardiovascular disease  - EKG 12-Lead  13. Medication management  - Urinalysis, Routine w reflex microscopic - POC Hemoccult Bld/Stl - CBC with Differential/Platelet - COMPLETE METABOLIC PANEL WITH GFR - Magnesium - Lipid panel - TSH - Hemoglobin A1c - Insulin, random - VITAMIN D 25  Hydroxy           Patient was counseled in prudent diet to achieve/maintain BMI less than 25 for weight control, BP monitoring, regular exercise and medications. Discussed med's effects and SE's. Screening labs and tests as requested with regular follow-up as recommended. Over 40 minutes of exam, counseling, chart review and high complex critical decision making was performed.

## 2018-02-14 ENCOUNTER — Encounter (INDEPENDENT_AMBULATORY_CARE_PROVIDER_SITE_OTHER): Payer: Self-pay

## 2018-02-14 ENCOUNTER — Encounter: Payer: Self-pay | Admitting: Internal Medicine

## 2018-02-14 ENCOUNTER — Ambulatory Visit (INDEPENDENT_AMBULATORY_CARE_PROVIDER_SITE_OTHER): Payer: Medicare Other | Admitting: Internal Medicine

## 2018-02-14 VITALS — BP 114/82 | HR 76 | Temp 97.5°F | Resp 16 | Ht 62.5 in | Wt 197.0 lb

## 2018-02-14 DIAGNOSIS — Z0000 Encounter for general adult medical examination without abnormal findings: Secondary | ICD-10-CM

## 2018-02-14 DIAGNOSIS — M797 Fibromyalgia: Secondary | ICD-10-CM | POA: Diagnosis not present

## 2018-02-14 DIAGNOSIS — M545 Low back pain: Secondary | ICD-10-CM | POA: Diagnosis not present

## 2018-02-14 DIAGNOSIS — M069 Rheumatoid arthritis, unspecified: Secondary | ICD-10-CM

## 2018-02-14 DIAGNOSIS — Z136 Encounter for screening for cardiovascular disorders: Secondary | ICD-10-CM | POA: Diagnosis not present

## 2018-02-14 DIAGNOSIS — E559 Vitamin D deficiency, unspecified: Secondary | ICD-10-CM

## 2018-02-14 DIAGNOSIS — E039 Hypothyroidism, unspecified: Secondary | ICD-10-CM

## 2018-02-14 DIAGNOSIS — Z1211 Encounter for screening for malignant neoplasm of colon: Secondary | ICD-10-CM

## 2018-02-14 DIAGNOSIS — Z79891 Long term (current) use of opiate analgesic: Secondary | ICD-10-CM | POA: Diagnosis not present

## 2018-02-14 DIAGNOSIS — G894 Chronic pain syndrome: Secondary | ICD-10-CM | POA: Diagnosis not present

## 2018-02-14 DIAGNOSIS — E782 Mixed hyperlipidemia: Secondary | ICD-10-CM | POA: Diagnosis not present

## 2018-02-14 DIAGNOSIS — Z0001 Encounter for general adult medical examination with abnormal findings: Secondary | ICD-10-CM

## 2018-02-14 DIAGNOSIS — E8881 Metabolic syndrome: Secondary | ICD-10-CM

## 2018-02-14 DIAGNOSIS — I10 Essential (primary) hypertension: Secondary | ICD-10-CM | POA: Diagnosis not present

## 2018-02-14 DIAGNOSIS — Z8249 Family history of ischemic heart disease and other diseases of the circulatory system: Secondary | ICD-10-CM

## 2018-02-14 DIAGNOSIS — Z87891 Personal history of nicotine dependence: Secondary | ICD-10-CM

## 2018-02-14 DIAGNOSIS — M0609 Rheumatoid arthritis without rheumatoid factor, multiple sites: Secondary | ICD-10-CM | POA: Diagnosis not present

## 2018-02-14 DIAGNOSIS — R7309 Other abnormal glucose: Secondary | ICD-10-CM

## 2018-02-14 DIAGNOSIS — Z1212 Encounter for screening for malignant neoplasm of rectum: Secondary | ICD-10-CM

## 2018-02-14 DIAGNOSIS — M25559 Pain in unspecified hip: Secondary | ICD-10-CM | POA: Diagnosis not present

## 2018-02-14 DIAGNOSIS — Z79899 Other long term (current) drug therapy: Secondary | ICD-10-CM

## 2018-02-14 DIAGNOSIS — M25539 Pain in unspecified wrist: Secondary | ICD-10-CM | POA: Diagnosis not present

## 2018-02-14 DIAGNOSIS — R7303 Prediabetes: Secondary | ICD-10-CM | POA: Diagnosis not present

## 2018-02-14 MED ORDER — METFORMIN HCL ER 500 MG PO TB24: ORAL_TABLET | ORAL | Status: DC

## 2018-02-15 LAB — COMPLETE METABOLIC PANEL WITH GFR
AG RATIO: 1.6 (calc) (ref 1.0–2.5)
ALBUMIN MSPROF: 4.4 g/dL (ref 3.6–5.1)
ALT: 18 U/L (ref 6–29)
AST: 18 U/L (ref 10–35)
Alkaline phosphatase (APISO): 73 U/L (ref 33–130)
BILIRUBIN TOTAL: 0.3 mg/dL (ref 0.2–1.2)
BUN: 14 mg/dL (ref 7–25)
CALCIUM: 9.8 mg/dL (ref 8.6–10.4)
CHLORIDE: 100 mmol/L (ref 98–110)
CO2: 30 mmol/L (ref 20–32)
Creat: 0.62 mg/dL (ref 0.50–1.05)
GFR, EST AFRICAN AMERICAN: 117 mL/min/{1.73_m2} (ref >=60)
GFR, EST NON AFRICAN AMERICAN: 101 mL/min/{1.73_m2} (ref >=60)
GLOBULIN: 2.7 g/dL (ref 1.9–3.7)
Glucose, Bld: 91 mg/dL (ref 65–99)
POTASSIUM: 4.3 mmol/L (ref 3.5–5.3)
SODIUM: 137 mmol/L (ref 135–146)
TOTAL PROTEIN: 7.1 g/dL (ref 6.1–8.1)

## 2018-02-15 LAB — CBC WITH DIFFERENTIAL/PLATELET
BASOS PCT: 0.2 %
Basophils Absolute: 13 cells/uL (ref 0–200)
Eosinophils Absolute: 13 cells/uL — ABNORMAL LOW (ref 15–500)
Eosinophils Relative: 0.2 %
HEMATOCRIT: 41.9 % (ref 35.0–45.0)
Hemoglobin: 14.2 g/dL (ref 11.7–15.5)
LYMPHS ABS: 3424 {cells}/uL (ref 850–3900)
MCH: 28.4 pg (ref 27.0–33.0)
MCHC: 33.9 g/dL (ref 32.0–36.0)
MCV: 83.8 fL (ref 80.0–100.0)
MPV: 9.9 fL (ref 7.5–12.5)
Monocytes Relative: 5.2 %
NEUTROS PCT: 40.9 %
Neutro Abs: 2618 cells/uL (ref 1500–7800)
Platelets: 357 10*3/uL (ref 140–400)
RBC: 5 10*6/uL (ref 3.80–5.10)
RDW: 13.3 % (ref 11.0–15.0)
TOTAL LYMPHOCYTE: 53.5 %
WBC: 6.4 10*3/uL (ref 3.8–10.8)
WBCMIX: 333 {cells}/uL (ref 200–950)

## 2018-02-15 LAB — LIPID PANEL
CHOL/HDL RATIO: 4.6 (calc) (ref <5.0)
Cholesterol: 337 mg/dL — ABNORMAL HIGH (ref <200)
HDL: 73 mg/dL (ref >50)
LDL Cholesterol (Calc): 233 mg/dL (calc) — ABNORMAL HIGH
NON-HDL CHOLESTEROL (CALC): 264 mg/dL — AB (ref <130)
Triglycerides: 150 mg/dL — ABNORMAL HIGH (ref <150)

## 2018-02-15 LAB — MICROALBUMIN / CREATININE URINE RATIO
CREATININE, URINE: 58 mg/dL (ref 20–275)
Microalb Creat Ratio: 69 mcg/mg creat — ABNORMAL HIGH (ref <30)
Microalb, Ur: 4 mg/dL

## 2018-02-15 LAB — MAGNESIUM: Magnesium: 2 mg/dL (ref 1.5–2.5)

## 2018-02-15 LAB — HEMOGLOBIN A1C
Hgb A1c MFr Bld: 5.3 % of total Hgb (ref <5.7)
Mean Plasma Glucose: 105 (calc)
eAG (mmol/L): 5.8 (calc)

## 2018-02-15 LAB — URINALYSIS, ROUTINE W REFLEX MICROSCOPIC
BILIRUBIN URINE: NEGATIVE
Glucose, UA: NEGATIVE
Hgb urine dipstick: NEGATIVE
KETONES UR: NEGATIVE
Leukocytes, UA: NEGATIVE
Nitrite: NEGATIVE
Protein, ur: NEGATIVE
SPECIFIC GRAVITY, URINE: 1.014 (ref 1.001–1.03)
pH: 5.5 (ref 5.0–8.0)

## 2018-02-15 LAB — TSH: TSH: 0.7 m[IU]/L (ref 0.40–4.50)

## 2018-02-15 LAB — INSULIN, RANDOM: Insulin: 10.1 u[IU]/mL (ref 2.0–19.6)

## 2018-02-15 LAB — VITAMIN D 25 HYDROXY (VIT D DEFICIENCY, FRACTURES): Vit D, 25-Hydroxy: 52 ng/mL (ref 30–100)

## 2018-02-28 DIAGNOSIS — Z79891 Long term (current) use of opiate analgesic: Secondary | ICD-10-CM | POA: Diagnosis not present

## 2018-02-28 DIAGNOSIS — Z79899 Other long term (current) drug therapy: Secondary | ICD-10-CM | POA: Diagnosis not present

## 2018-02-28 DIAGNOSIS — M792 Neuralgia and neuritis, unspecified: Secondary | ICD-10-CM | POA: Diagnosis not present

## 2018-02-28 DIAGNOSIS — G894 Chronic pain syndrome: Secondary | ICD-10-CM | POA: Diagnosis not present

## 2018-03-13 ENCOUNTER — Other Ambulatory Visit: Payer: Self-pay | Admitting: Internal Medicine

## 2018-03-16 DIAGNOSIS — M25559 Pain in unspecified hip: Secondary | ICD-10-CM | POA: Diagnosis not present

## 2018-03-16 DIAGNOSIS — M25539 Pain in unspecified wrist: Secondary | ICD-10-CM | POA: Diagnosis not present

## 2018-03-16 DIAGNOSIS — G894 Chronic pain syndrome: Secondary | ICD-10-CM | POA: Diagnosis not present

## 2018-03-16 DIAGNOSIS — Z79899 Other long term (current) drug therapy: Secondary | ICD-10-CM | POA: Diagnosis not present

## 2018-03-16 DIAGNOSIS — M545 Low back pain: Secondary | ICD-10-CM | POA: Diagnosis not present

## 2018-03-27 DIAGNOSIS — M47817 Spondylosis without myelopathy or radiculopathy, lumbosacral region: Secondary | ICD-10-CM | POA: Diagnosis not present

## 2018-03-30 ENCOUNTER — Encounter (INDEPENDENT_AMBULATORY_CARE_PROVIDER_SITE_OTHER): Payer: Self-pay

## 2018-04-10 DIAGNOSIS — Z79891 Long term (current) use of opiate analgesic: Secondary | ICD-10-CM | POA: Diagnosis not present

## 2018-04-10 DIAGNOSIS — M47817 Spondylosis without myelopathy or radiculopathy, lumbosacral region: Secondary | ICD-10-CM | POA: Diagnosis not present

## 2018-04-10 DIAGNOSIS — Z79899 Other long term (current) drug therapy: Secondary | ICD-10-CM | POA: Diagnosis not present

## 2018-04-10 DIAGNOSIS — G894 Chronic pain syndrome: Secondary | ICD-10-CM | POA: Diagnosis not present

## 2018-05-04 DIAGNOSIS — M797 Fibromyalgia: Secondary | ICD-10-CM | POA: Diagnosis not present

## 2018-05-04 DIAGNOSIS — L820 Inflamed seborrheic keratosis: Secondary | ICD-10-CM | POA: Diagnosis not present

## 2018-05-04 DIAGNOSIS — D2371 Other benign neoplasm of skin of right lower limb, including hip: Secondary | ICD-10-CM | POA: Diagnosis not present

## 2018-05-04 DIAGNOSIS — M0609 Rheumatoid arthritis without rheumatoid factor, multiple sites: Secondary | ICD-10-CM | POA: Diagnosis not present

## 2018-05-04 DIAGNOSIS — L814 Other melanin hyperpigmentation: Secondary | ICD-10-CM | POA: Diagnosis not present

## 2018-05-04 DIAGNOSIS — M7551 Bursitis of right shoulder: Secondary | ICD-10-CM | POA: Diagnosis not present

## 2018-05-04 DIAGNOSIS — L821 Other seborrheic keratosis: Secondary | ICD-10-CM | POA: Diagnosis not present

## 2018-05-04 DIAGNOSIS — Z79899 Other long term (current) drug therapy: Secondary | ICD-10-CM | POA: Diagnosis not present

## 2018-05-04 DIAGNOSIS — L732 Hidradenitis suppurativa: Secondary | ICD-10-CM | POA: Diagnosis not present

## 2018-05-04 DIAGNOSIS — M25539 Pain in unspecified wrist: Secondary | ICD-10-CM | POA: Diagnosis not present

## 2018-05-08 ENCOUNTER — Other Ambulatory Visit: Payer: Self-pay | Admitting: Adult Health

## 2018-05-08 DIAGNOSIS — E782 Mixed hyperlipidemia: Secondary | ICD-10-CM

## 2018-05-11 DIAGNOSIS — M069 Rheumatoid arthritis, unspecified: Secondary | ICD-10-CM | POA: Diagnosis not present

## 2018-05-11 DIAGNOSIS — M545 Low back pain: Secondary | ICD-10-CM | POA: Diagnosis not present

## 2018-05-11 DIAGNOSIS — G894 Chronic pain syndrome: Secondary | ICD-10-CM | POA: Diagnosis not present

## 2018-05-11 DIAGNOSIS — M25511 Pain in right shoulder: Secondary | ICD-10-CM | POA: Diagnosis not present

## 2018-05-29 ENCOUNTER — Ambulatory Visit: Payer: Self-pay | Admitting: Adult Health

## 2018-06-02 NOTE — Progress Notes (Signed)
MEDICARE ANNUAL WELLNESS VISIT AND FOLLOW UP  Assessment:    Diagnoses and all orders for this visit:  Encounter for Medicare annual wellness exam  Essential hypertension Continue medication Monitor blood pressure at home; call if consistently over 130/80 Continue DASH diet.   Reminder to go to the ER if any CP, SOB, nausea, dizziness, severe HA, changes vision/speech, left arm numbness and tingling and jaw pain.  Intrinsic asthma Stable on inhalers/meds  Sjogren's syndrome, with unspecified organ involvement ([place]) On DMARD, followed by rheumatology  Postoperative hypothyroidism continue medications the same pending lab results reminded to take on an empty stomach 30-56mins before food.  check TSH level  Cervical spondylosis with radiculopathy Currently fairly managed; followed by Dr. Dossie Der and Dr. Cyndy Freeze  Rheumatoid arthritis involving multiple sites, unspecified rheumatoid factor presence ([place]) Managed by Dr. Dossie Der, on DMARD  Degeneration of intervertebral disc of lumbosacral region Continue NSAID PRN, refer to ortho PRN  Vitamin D deficiency At goal at recent check; continue to recommend supplementation for goal of 70-100 Defer vitamin D level  Prediabetes Controlled on metformin for hx of severe fasting glucose elevation Discussed disease and risks Discussed diet/exercise, weight management  A1C  Morbid obesity ([place]) Long discussion about weight loss, diet, and exercise Recommended diet heavy in fruits and veggies and low in animal meats, cheeses, and dairy products, appropriate calorie intake Discussed appropriate weight for height and initial goal (185lb) Follow up at next visit  Mixed hyperlipidemia Continue medications Continue low cholesterol diet and exercise.  Check lipid panel.   Medication management CBC, CMP/GFR, magnesium    Over 40 minutes of exam, counseling, chart review and critical decision making was performed Future Appointments   Date Time Provider [place]  09/01/2018 10:30 AM Unk Pinto, MD [facility] None  03/13/2019  9:00 AM Unk Pinto, MD [facility] None     Plan:   During the course of the visit the patient was educated and counseled about appropriate screening and preventive services including:    Pneumococcal vaccine   Prevnar 13  Influenza vaccine  Td vaccine  Screening electrocardiogram  Bone densitometry screening  Colorectal cancer screening  Diabetes screening  Glaucoma screening  Nutrition counseling   Advanced directives: requested   Subjective:  Autumn Frye is a 57 y.o. female who presents for Medicare Annual Wellness Visit and 3 month follow up. Patient is followed by Dr Dossie Der  for Rheumatoid Arthritis since 2009.   BMI is Body mass index is 35.82 kg/m., she has been working on diet and exercise, going to the [facility] 3 days a week, would like to get down to 140 lb or so, cutting out starch and sugar.  Wt Readings from Last 3 Encounters:  06/07/18 199 lb (90.3 kg)  02/14/18 197 lb (89.4 kg)  11/09/17 200 lb (90.7 kg)   Her blood pressure has been controlled at home, today their BP is BP: 120/80 She does workout. She denies chest pain, shortness of breath, dizziness.   She is on cholesterol medication (atorvastatin 40 mg three times weekly, zetia 10 mg daily) and denies myalgias. Her LDL cholesterol is at goal. The cholesterol last visit was:   Lab Results  Component Value Date   CHOL 337 (H) 02/14/2018   HDL 73 02/14/2018   LDLCALC 233 (H) 02/14/2018   TRIG 150 (H) 02/14/2018   CHOLHDL 4.6 02/14/2018    She has been working on diet and exercise for prediabetes controlled on metformin 500 mg once daily (initiated by Dr.  Balan for hx of fasting glucose 300+), and denies foot ulcerations, increased appetite, nausea, paresthesia of the feet, polydipsia, polyuria and visual disturbances. Last A1C in the office was:  Lab Results  Component Value Date    HGBA1C 5.3 02/14/2018   She is on thyroid medication. Her medication was not changed last visit.   Lab Results  Component Value Date   TSH 0.70 02/14/2018   Last GFR: Lab Results  Component Value Date   GFRNONAA 101 02/14/2018   Patient is on Vitamin D supplement.   Lab Results  Component Value Date   VD25OH 52 02/14/2018      Medication Review: Current Outpatient Medications on File Prior to Visit  Medication Sig Dispense Refill  . Abatacept 125 MG/ML SOSY Inject 1 Syringe into the skin every 7 (seven) days.    Marland Kitchen acetaminophen (TYLENOL) 500 MG tablet Take 1 tablet (500 mg total) by mouth every 6 (six) hours as needed. 30 tablet 0  . Acetylcysteine (N-ACETYL-L-CYSTEINE PO) Take 1 capsule by mouth daily.    Marland Kitchen albuterol (PROAIR HFA) 108 (90 Base) MCG/ACT inhaler Inhale 1-2 puffs into the lungs every 6 (six) hours as needed for wheezing or shortness of breath. Reported on 03/12/2016 18 g 3  . albuterol (PROVENTIL) (2.5 MG/3ML) 0.083% nebulizer solution Take 3 mLs (2.5 mg total) by nebulization every 6 (six) hours as needed for wheezing or shortness of breath. 60 vial 1  . amitriptyline (ELAVIL) 25 MG tablet Take 25 mg by mouth at bedtime.     Marland Kitchen atorvastatin (LIPITOR) 80 MG tablet Take 40 mg by mouth 3 (three) times a week.    . AVENOVA 0.01 % SOLN Place 1 application into both eyes 2 (two) times daily. Apply to eyelids BID  1  . buprenorphine (BUTRANS) 10 MCG/HR PTWK patch Place 15 mcg onto the skin once a week.     . Cholecalciferol (VITAMIN D3) 5000 UNITS TABS Take 5,000 Units by mouth daily.     Marland Kitchen ezetimibe (ZETIA) 10 MG tablet TAKE 1 TABLET BY MOUTH DAILY FOR CHOLESTEROL 90 tablet 3  . FEMRING 0.1 MG/24HR RING INSERT 1 RING VAGINALLY EVERY 3 MONTHS 1 each 3  . furosemide (LASIX) 40 MG tablet TAKE 1 TABLET BY MOUTH THREE TIMES DAILY FOR FLUID RETENTION (Patient taking differently: TAKE 1 TABLET BY MOUTH daily      takes as needed) 270 tablet 1  . L-GLUTAMINE PO Take 1 capsule by mouth  daily.    Marland Kitchen l-methylfolate-B6-B12 (METANX) 3-35-2 MG TABS tablet Take 1 tablet by mouth daily.    Marland Kitchen levothyroxine (SYNTHROID) 125 MCG tablet Take 1 tablet (125 mcg total) by mouth daily. 30 tablet 11  . liothyronine (CYTOMEL) 5 MCG tablet TAKE 1 TABLET BY MOUTH EVERY DAY 60 tablet 0  . LOTEMAX 0.5 % GEL Place 1 drop into both eyes 3 (three) times daily.   2  . Magnesium Oxide 500 MG TABS Take 500 mg by mouth daily.    . Menaquinone-7 (VITAMIN K2) 100 MCG CAPS Take 1 capsule by mouth daily.    . metFORMIN (GLUCOPHAGE-XR) 500 MG 24 hr tablet Take 2 tablets 2 x/ day for Diabetes (Patient taking differently: Take 1 tablet by mouth after dinner) 360 tablet   . methocarbamol (ROBAXIN) 750 MG tablet Take 1 tablet (750 mg total) by mouth 4 (four) times daily. (Patient taking differently: Take 750 mg by mouth as needed. ) 90 tablet 2  . MILK THISTLE EXTRACT PO Take 2 capsules  by mouth daily.    . Omega-3 Fatty Acids (FISH OIL) 1200 MG CAPS Take 2 capsules by mouth daily.     Vladimir Faster Glycol-Propyl Glycol (SYSTANE) 0.4-0.3 % SOLN Place 1 drop into both eyes 4 (four) times daily.     . RESTASIS 0.05 % ophthalmic emulsion Place 1 drop into both eyes daily.   3  . TURMERIC CURCUMIN PO Take 900 mg elemental calcium/kg/hr by mouth 2 (two) times daily.    . cyclobenzaprine (FLEXERIL) 10 MG tablet TAKE 1 TABLET(10 MG) BY MOUTH AT BEDTIME 90 tablet 0  . oxyCODONE-acetaminophen (PERCOCET) 7.5-325 MG tablet Take 1 tablet by mouth every 4 (four) hours as needed for severe pain. 30 tablet 0   No current facility-administered medications on file prior to visit.     Allergies  Allergen Reactions  . Ciprofloxacin Hives and Swelling    SWELLING REACTION UNSPECIFIED   . Levaquin [Levofloxacin] Hives  . Lyrica [Pregabalin] Other (See Comments)    Altered mental status ALTERED MENTAL STATUS HALLUCINATIONS  . Compazine [Prochlorperazine Edisylate]     UNSPECIFIED REACTION   . Hydrocodone Itching  . Morphine And  Related Itching  . Penicillins Itching  . Sulfa Antibiotics Itching    Current Problems (verified) Patient Active Problem List   Diagnosis Date Noted  . Morbid obesity ([place]) 11/08/2017  . Abnormal glucose 08/02/2017  . Cervical spondylosis with radiculopathy 05/30/2017  . Rheumatoid arthritis involving multiple sites ([place]) 11/14/2015  . Encounter for Medicare annual wellness exam 04/24/2015  . Sjogren's disease ([place]) 07/05/2014  . Medication management 03/26/2014  . Mixed hyperlipidemia 09/24/2013  . Prediabetes 09/24/2013  . Vitamin D deficiency 09/24/2013  . Intrinsic asthma   . Hypertension   . Hypothyroidism   . Degeneration of intervertebral disc of lumbosacral region 05/23/2008    Screening Tests Immunization History  Administered Date(s) Administered  . DTaP 09/15/2012, 09/15/2012  . Influenza Inj Mdck Quad With Preservative 08/02/2017  . Influenza Whole 09/06/2012  . Influenza-Unspecified 05/07/2014, 05/20/2016  . Pneumococcal Conjugate-13 09/15/2012, 07/05/2014  . Zoster 09/03/2013    Colonoscopy: 2013 - 10 year follow up MGM: 02/09/2018 Stress: 2004 DEXA: 2013 Echo: 2004 CXr: 02/2016 PAP: had in the last 6 months by GYN - report requested  Tdap: 2014 Flu vaccine: 2018, declines, will get at pharmacy  All other vaccines completed   Names of Other Physician/Practitioners you currently use: 1. [facility] here for primary care 2. Dr Sherral Hammers, eye doctor, last visit Jan 2019 - mild cataracts 3. Dr Posey Pronto, dentist, last visit 2019, goes q1m   Patient Care Team: Unk Pinto, MD as PCP - General (Internal Medicine) Ladene Artist, MD as Consulting Physician (Gastroenterology) Newt Minion, MD as Consulting Physician (Orthopedic Surgery) Unice Bailey, MD as Consulting Physician (Rheumatology) Karie Chimera, MD as Consulting Physician (Neurosurgery) Druscilla Brownie, MD as Consulting Physician  (Dermatology) Druscilla Brownie, MD as Consulting Physician (Dermatology)  SURGICAL HISTORY She  has a past surgical history that includes Thyroidectomy; Upper gastrointestinal endoscopy; Total knee arthroplasty; Vaginal hysterectomy; Knee surgery; Pilonidal cyst excision; Tonsillectomy; Anterior lat lumbar fusion (Left, 01/26/2013); Lumbar percutaneous pedicle screw 1 level (Left, 01/26/2013); Knee Arthroplasty (Left, 02/16/2016); and Anterior cervical decomp/discectomy fusion (N/A, 05/30/2017). FAMILY HISTORY Her family history includes Asthma in her brother; Breast cancer (age of onset: 69) in her maternal grandmother; Cancer in her maternal grandmother; Diabetes in her brother; Hypertension in her father; Other in her daughter; Stroke in her father. SOCIAL HISTORY She  reports that she quit smoking about 2 years ago. Her smoking use included cigarettes. She has a 20.00 pack-year smoking history. She has never used smokeless tobacco. She reports that she drinks alcohol. She reports that she does not use drugs.   MEDICARE WELLNESS OBJECTIVES: Physical activity: Current Exercise Habits: Structured exercise class, Type of exercise: yoga;strength training/weights;Other - see comments;walking(swimming), Time (Minutes): 60, Frequency (Times/Week): 3, Weekly Exercise (Minutes/Week): 180, Intensity: Mild, Exercise limited by: orthopedic condition(s) Cardiac risk factors: Cardiac Risk Factors include: dyslipidemia;hypertension;smoking/ tobacco exposure;obesity (BMI >30kg/m2) Depression/mood screen:   Depression screen [facility] 2/9 06/07/2018  Decreased Interest 0  Down, Depressed, Hopeless 0  PHQ - 2 Score 0    ADLs:  In your present state of health, do you have any difficulty performing the following activities: 06/07/2018 02/13/2018  Hearing? N N  Vision? N N  Difficulty concentrating or making decisions? N N  Walking or climbing stairs? N N  Dressing or bathing? N N  Doing errands, shopping? N N  Some  recent data might be hidden     Cognitive Testing  Alert? Yes  Normal Appearance?Yes  Oriented to person? Yes  Place? Yes   Time? Yes  Recall of three objects?  Yes  Can perform simple calculations? Yes  Displays appropriate judgment?Yes  Can read the correct time from a watch face?Yes  EOL planning: Does Patient Have a Medical Advance Directive?: No Would patient like information on creating a medical advance directive?: Yes (MAU/Ambulatory/Procedural Areas - Information given)  Review of Systems  Constitutional: Negative for malaise/fatigue and weight loss.  HENT: Negative for hearing loss and tinnitus.   Eyes: Negative for blurred vision and double vision.  Respiratory: Negative for cough, sputum production, shortness of breath and wheezing.   Cardiovascular: Negative for chest pain, palpitations, orthopnea, claudication, leg swelling and PND.  Gastrointestinal: Negative for abdominal pain, blood in stool, constipation, diarrhea, heartburn, melena, nausea and vomiting.  Genitourinary: Negative.   Musculoskeletal: Negative for falls, joint pain and myalgias.  Skin: Negative for rash.  Neurological: Negative for dizziness, tingling, sensory change, weakness and headaches.  Endo/Heme/Allergies: Negative for polydipsia.  Psychiatric/Behavioral: Negative.  Negative for depression, memory loss, substance abuse and suicidal ideas. The patient is not nervous/anxious and does not have insomnia.   All other systems reviewed and are negative.    Objective:     Today's Vitals   06/07/18 1051  BP: 120/80  Pulse: 73  Temp: (!) 96.4 F (35.8 C)  SpO2: 97%  Weight: 199 lb (90.3 kg)  Height: 5' 2.5" (1.588 m)  PainSc: 4   PainLoc: Ankle   Body mass index is 35.82 kg/m.  General appearance: alert, no distress, WD/WN, female HEENT: normocephalic, sclerae anicteric, TMs pearly, nares patent, no discharge or erythema, pharynx normal Oral cavity: MMM, no lesions Neck: supple, no  lymphadenopathy, no thyromegaly, no masses Heart: RRR, normal S1, S2, no murmurs Lungs: CTA bilaterally, no wheezes, rhonchi, or rales Abdomen: +bs, soft, non tender, non distended, no masses, no hepatomegaly, no splenomegaly Musculoskeletal: nontender, no swelling, no obvious deformity Extremities: no edema, no cyanosis, no clubbing Pulses: 2+ symmetric, upper and lower extremities, normal cap refill Neurological: alert, oriented x 3, CN2-12 intact, strength normal upper extremities and lower extremities, sensation normal throughout, DTRs 2+ throughout, no cerebellar signs, gait normal Psychiatric: normal affect, behavior normal, pleasant   Medicare Attestation I have personally reviewed: The patient's medical and social history Their use of alcohol, tobacco or illicit drugs Their current medications and supplements  The patient's functional ability including ADLs,fall risks, home safety risks, cognitive, and hearing and visual impairment Diet and physical activities Evidence for depression or mood disorders  The patient's weight, height, BMI, and visual acuity have been recorded in the chart.  I have made referrals, counseling, and provided education to the patient based on review of the above and I have provided the patient with a written personalized care plan for preventive services.     Izora Ribas, NP   06/07/2018

## 2018-06-05 ENCOUNTER — Ambulatory Visit (HOSPITAL_COMMUNITY)
Admission: RE | Admit: 2018-06-05 | Discharge: 2018-06-05 | Disposition: A | Discharge status: Home / Self Care | Payer: Medicare Other | Source: Ambulatory Visit | Attending: Physician Assistant | Admitting: Physician Assistant

## 2018-06-05 ENCOUNTER — Other Ambulatory Visit (HOSPITAL_COMMUNITY): Payer: Self-pay | Admitting: Physician Assistant

## 2018-06-05 DIAGNOSIS — M25511 Pain in right shoulder: Secondary | ICD-10-CM | POA: Insufficient documentation

## 2018-06-05 DIAGNOSIS — S4991XA Unspecified injury of right shoulder and upper arm, initial encounter: Secondary | ICD-10-CM | POA: Diagnosis not present

## 2018-06-07 ENCOUNTER — Encounter: Payer: Self-pay | Admitting: Adult Health

## 2018-06-07 ENCOUNTER — Ambulatory Visit (INDEPENDENT_AMBULATORY_CARE_PROVIDER_SITE_OTHER): Payer: Medicare Other | Admitting: Adult Health

## 2018-06-07 VITALS — BP 120/80 | HR 73 | Temp 96.4°F | Ht 62.5 in | Wt 199.0 lb

## 2018-06-07 DIAGNOSIS — R7303 Prediabetes: Secondary | ICD-10-CM | POA: Diagnosis not present

## 2018-06-07 DIAGNOSIS — R6889 Other general symptoms and signs: Secondary | ICD-10-CM

## 2018-06-07 DIAGNOSIS — E890 Postprocedural hypothyroidism: Secondary | ICD-10-CM | POA: Diagnosis not present

## 2018-06-07 DIAGNOSIS — E559 Vitamin D deficiency, unspecified: Secondary | ICD-10-CM

## 2018-06-07 DIAGNOSIS — Z79899 Other long term (current) drug therapy: Secondary | ICD-10-CM | POA: Diagnosis not present

## 2018-06-07 DIAGNOSIS — J45909 Unspecified asthma, uncomplicated: Secondary | ICD-10-CM

## 2018-06-07 DIAGNOSIS — M4722 Other spondylosis with radiculopathy, cervical region: Secondary | ICD-10-CM

## 2018-06-07 DIAGNOSIS — M3500 Sicca syndrome, unspecified: Secondary | ICD-10-CM

## 2018-06-07 DIAGNOSIS — Z0001 Encounter for general adult medical examination with abnormal findings: Secondary | ICD-10-CM

## 2018-06-07 DIAGNOSIS — M5137 Other intervertebral disc degeneration, lumbosacral region: Secondary | ICD-10-CM

## 2018-06-07 DIAGNOSIS — M069 Rheumatoid arthritis, unspecified: Secondary | ICD-10-CM

## 2018-06-07 DIAGNOSIS — I10 Essential (primary) hypertension: Secondary | ICD-10-CM

## 2018-06-07 DIAGNOSIS — R7309 Other abnormal glucose: Secondary | ICD-10-CM

## 2018-06-07 DIAGNOSIS — E782 Mixed hyperlipidemia: Secondary | ICD-10-CM | POA: Diagnosis not present

## 2018-06-07 DIAGNOSIS — Z0000 Encounter for general adult medical examination without abnormal findings: Secondary | ICD-10-CM

## 2018-06-07 MED ORDER — ROSUVASTATIN CALCIUM 40 MG PO TABS: ORAL_TABLET | ORAL | 1 refills | Status: DC

## 2018-06-07 NOTE — Patient Instructions (Addendum)
[name] , Thank you for taking time to come for your Medicare Wellness Visit. I appreciate your ongoing commitment to your health goals. Please review the following plan we discussed and let me know if I can assist you in the future.   These are the goals we discussed: Goals    . Exercise 150 min/wk Moderate Activity    . LDL CALC < 130    . Weight (lb) < 185 lb (83.9 kg)       This is a list of the screening recommended for you and due dates:  Health Maintenance  Topic Date Due  . Flu Shot  04/06/2018  . Pap Smear  07/08/2018*  . Mammogram  02/10/2020  . Colon Cancer Screening  10/12/2021  . Tetanus Vaccine  09/15/2022  .  Hepatitis C: One time screening is recommended by Center for Disease Control  (CDC) for  adults born from [birth year] through 1965.   Completed  . HIV Screening  Completed  *Topic was postponed. The date shown is not the original due date.     Stop atorvastatin, take 40 mg (1 tab) rosuvastatin three times a week in the evenings for cholesterol. Please let us know if you experience any new muscle aches/severe fatigue.      Preventing High Cholesterol Cholesterol is a waxy, fat-like substance that your body needs in small amounts. Your liver makes all the cholesterol that your body needs. Having high cholesterol (hypercholesterolemia) increases your risk for heart disease and stroke. Extra (excess) cholesterol comes from the food you eat, such as animal-based fat (saturated fat) from meat and some dairy products. High cholesterol can often be prevented with diet and lifestyle changes. If you already have high cholesterol, you can control it with diet and lifestyle changes, as well as medicine. What nutrition changes can be made?  Eat less saturated fat. Foods that contain saturated fat include red meat and some dairy products.  Avoid processed meats, like bacon and lunch meats.  Avoid trans fats, which are found in margarine and some baked goods.  Avoid foods  and beverages that have added sugars.  Eat more fruits, vegetables, and whole grains.  Choose healthy sources of protein, such as fish, poultry, and nuts.  Choose healthy sources of fat, such as: ? Nuts. ? Vegetable oils, especially olive oil. ? Fish that have healthy fats (omega-3 fatty acids), such as mackerel or salmon. What lifestyle changes can be made?  Lose weight if you are overweight. Losing 5-10 lb (2.3-4.5 kg) can help prevent or control high cholesterol and reduce your risk for diabetes and high blood pressure. Ask your health care provider to help you with a diet and exercise plan to safely lose weight.  Get enough exercise. Do at least 150 minutes of moderate-intensity exercise each week. ? You could do this in short exercise sessions several times a day, or you could do longer exercise sessions a few times a week. For example, you could take a brisk 10-minute walk or bike ride, 3 times a day, for 5 days a week.  Do not smoke. If you need help quitting, ask your health care provider.  Limit your alcohol intake. If you drink alcohol, limit alcohol intake to no more than 1 drink a day for nonpregnant women and 2 drinks a day for men. One drink equals 12 oz of beer, 5 oz of wine, or 1 oz of hard liquor. Why are these changes important? If you have high cholesterol, deposits (  plaques) may build up on the walls of your blood vessels. Plaques make the arteries narrower and stiffer, which can restrict or block blood flow and cause blood clots to form. This greatly increases your risk for heart attack and stroke. Making diet and lifestyle changes can reduce your risk for these life-threatening conditions. What can I do to lower my risk?  Manage your risk factors for high cholesterol. Talk with your health care provider about all of your risk factors and how to lower your risk.  Manage other conditions that you have, such as diabetes or high blood pressure (hypertension).  Have your  cholesterol checked at regular intervals.  Keep all follow-up visits as told by your health care provider. This is important. How is this treated? In addition to diet and lifestyle changes, your health care provider may recommend medicines to help lower cholesterol, such as a medicine to reduce the amount of cholesterol made in your liver. You may need medicine if:  Diet and lifestyle changes do not lower your cholesterol enough.  You have high cholesterol and other risk factors for heart disease or stroke.  Take over-the-counter and prescription medicines only as told by your health care provider. Where to find more information:  American Heart Association: ThisTune.com.pt.jsp  National Heart, Lung, and Blood Institute: FrenchToiletries.com.cy Summary  High cholesterol increases your risk for heart disease and stroke. By keeping your cholesterol level low, you can reduce your risk for these conditions.  Diet and lifestyle changes are the most important steps in preventing high cholesterol.  Work with your health care provider to manage your risk factors, and have your blood tested regularly. This information is not intended to replace advice given to you by your health care provider. Make sure you discuss any questions you have with your health care provider. Document Released: 09/07/2015 Document Revised: 05/01/2016 Document Reviewed: 05/01/2016 Elsevier Interactive Patient Education  2018 [place].   Rosuvastatin Tablets What is this medicine? ROSUVASTATIN (roe SOO va sta tin) is known as a HMG-CoA reductase inhibitor or 'statin'. It lowers cholesterol and triglycerides in the blood. This drug may also reduce the risk of heart attack, stroke, or other health problems in patients with risk factors for heart disease. Diet and lifestyle changes are often used with this  drug. This medicine may be used for other purposes; ask your health care provider or pharmacist if you have questions. COMMON BRAND NAME(S): Crestor What should I tell my health care provider before I take this medicine? They need to know if you have any of these conditions: -frequently drink alcoholic beverages -kidney disease -liver disease -muscle aches or weakness -other medical condition -an unusual or allergic reaction to rosuvastatin, other medicines, foods, dyes, or preservatives -pregnant or trying to get pregnant -breast-feeding How should I use this medicine? Take this medicine by mouth with a glass of water. Follow the directions on the prescription label. Do not cut, crush or chew this medicine. You can take this medicine with or without food. Take your doses at regular intervals. Do not take your medicine more often than directed. Talk to your pediatrician regarding the use of this medicine in children. While this drug may be prescribed for children as young as 44 years old for selected conditions, precautions do apply. Overdosage: If you think you have taken too much of this medicine contact a poison control center or emergency room at once. NOTE: This medicine is only for you. Do not share this medicine with others. What  if I miss a dose? If you miss a dose, take it as soon as you can. Do not take 2 doses within 12 hours of each other. If there are less than 12 hours until your next dose, take only that dose. Do not take double or extra doses. What may interact with this medicine? Do not take this medicine with any of the following medications: -herbal medicines like red yeast rice This medicine may also interact with the following medications: -alcohol -antacids containing aluminum hydroxide or magnesium hydroxide -cyclosporine -other medicines for high cholesterol -some medicines for HIV infection -warfarin This list may not describe all possible interactions. Give your  health care provider a list of all the medicines, herbs, non-prescription drugs, or dietary supplements you use. Also tell them if you smoke, drink alcohol, or use illegal drugs. Some items may interact with your medicine. What should I watch for while using this medicine? Visit your doctor or health care professional for regular check-ups. You may need regular tests to make sure your liver is working properly. Tell your doctor or health care professional right away if you get any unexplained muscle pain, tenderness, or weakness, especially if you also have a fever and tiredness. Your doctor or health care professional may tell you to stop taking this medicine if you develop muscle problems. If your muscle problems do not go away after stopping this medicine, contact your health care professional. This medicine may affect blood sugar levels. If you have diabetes, check with your doctor or health care professional before you change your diet or the dose of your diabetic medicine. Avoid taking antacids containing aluminum, calcium or magnesium within 2 hours of taking this medicine. This drug is only part of a total heart-health program. Your doctor or a dietician can suggest a low-cholesterol and low-fat diet to help. Avoid alcohol and smoking, and keep a proper exercise schedule. Do not use this drug if you are pregnant or breast-feeding. Serious side effects to an unborn child or to an infant are possible. Talk to your doctor or pharmacist for more information. What side effects may I notice from receiving this medicine? Side effects that you should report to your doctor or health care professional as soon as possible: -allergic reactions like skin rash, itching or hives, swelling of the face, lips, or tongue -dark urine -fever -joint pain -muscle cramps, pain -redness, blistering, peeling or loosening of the skin, including inside the mouth -trouble passing urine or change in the amount of  urine -unusually weak or tired -yellowing of the eyes or skin Side effects that usually do not require medical attention (report to your doctor or health care professional if they continue or are bothersome): -constipation -heartburn -nausea -stomach gas, pain, upset This list may not describe all possible side effects. Call your doctor for medical advice about side effects. You may report side effects to FDA at 1-800-FDA-1088. Where should I keep my medicine? Keep out of the reach of children. Store at room temperature between 20 and 25 degrees C (68 and 77 degrees F). Keep container tightly closed (protect from moisture). Throw away any unused medicine after the expiration date. NOTE: This sheet is a summary. It may not cover all possible information. If you have questions about this medicine, talk to your doctor, pharmacist, or health care provider.  2018 Elsevier/Gold Standard (2015-02-06 13:33:08)

## 2018-06-08 DIAGNOSIS — G894 Chronic pain syndrome: Secondary | ICD-10-CM | POA: Diagnosis not present

## 2018-06-08 DIAGNOSIS — M545 Low back pain: Secondary | ICD-10-CM | POA: Diagnosis not present

## 2018-06-08 DIAGNOSIS — Z79899 Other long term (current) drug therapy: Secondary | ICD-10-CM | POA: Diagnosis not present

## 2018-06-08 DIAGNOSIS — Z79891 Long term (current) use of opiate analgesic: Secondary | ICD-10-CM | POA: Diagnosis not present

## 2018-06-08 DIAGNOSIS — M25511 Pain in right shoulder: Secondary | ICD-10-CM | POA: Diagnosis not present

## 2018-06-08 DIAGNOSIS — M069 Rheumatoid arthritis, unspecified: Secondary | ICD-10-CM | POA: Diagnosis not present

## 2018-06-08 LAB — LIPID PANEL
CHOL/HDL RATIO: 4 (calc) (ref <5.0)
CHOLESTEROL: 274 mg/dL — AB (ref <200)
HDL: 68 mg/dL (ref >50)
LDL Cholesterol (Calc): 187 mg/dL (calc) — ABNORMAL HIGH
Non-HDL Cholesterol (Calc): 206 mg/dL (calc) — ABNORMAL HIGH (ref <130)
Triglycerides: 84 mg/dL (ref <150)

## 2018-06-08 LAB — CBC WITH DIFFERENTIAL/PLATELET
BASOS PCT: 0.3 %
Basophils Absolute: 18 cells/uL (ref 0–200)
EOS ABS: 18 {cells}/uL (ref 15–500)
Eosinophils Relative: 0.3 %
HCT: 39.7 % (ref 35.0–45.0)
HEMOGLOBIN: 13.2 g/dL (ref 11.7–15.5)
LYMPHS ABS: 2850 {cells}/uL (ref 850–3900)
MCH: 28.6 pg (ref 27.0–33.0)
MCHC: 33.2 g/dL (ref 32.0–36.0)
MCV: 85.9 fL (ref 80.0–100.0)
MONOS PCT: 5.2 %
MPV: 10.1 fL (ref 7.5–12.5)
NEUTROS ABS: 2802 {cells}/uL (ref 1500–7800)
Neutrophils Relative %: 46.7 %
Platelets: 348 10*3/uL (ref 140–400)
RBC: 4.62 10*6/uL (ref 3.80–5.10)
RDW: 13.6 % (ref 11.0–15.0)
Total Lymphocyte: 47.5 %
WBC: 6 10*3/uL (ref 3.8–10.8)
WBCMIX: 312 {cells}/uL (ref 200–950)

## 2018-06-08 LAB — COMPLETE METABOLIC PANEL WITH GFR
AG RATIO: 1.7 (calc) (ref 1.0–2.5)
ALKALINE PHOSPHATASE (APISO): 63 U/L (ref 33–130)
ALT: 20 U/L (ref 6–29)
AST: 19 U/L (ref 10–35)
Albumin: 4.3 g/dL (ref 3.6–5.1)
BILIRUBIN TOTAL: 0.3 mg/dL (ref 0.2–1.2)
BUN: 15 mg/dL (ref 7–25)
CO2: 27 mmol/L (ref 20–32)
Calcium: 9.8 mg/dL (ref 8.6–10.4)
Chloride: 101 mmol/L (ref 98–110)
Creat: 0.51 mg/dL (ref 0.50–1.05)
GFR, EST NON AFRICAN AMERICAN: 107 mL/min/{1.73_m2} (ref >=60)
GFR, Est African American: 124 mL/min/{1.73_m2} (ref >=60)
GLOBULIN: 2.6 g/dL (ref 1.9–3.7)
Glucose, Bld: 87 mg/dL (ref 65–99)
Potassium: 5.2 mmol/L (ref 3.5–5.3)
SODIUM: 138 mmol/L (ref 135–146)
Total Protein: 6.9 g/dL (ref 6.1–8.1)

## 2018-06-08 LAB — MAGNESIUM: MAGNESIUM: 1.8 mg/dL (ref 1.5–2.5)

## 2018-06-08 LAB — HEMOGLOBIN A1C
EAG (MMOL/L): 6 (calc)
Hgb A1c MFr Bld: 5.4 % of total Hgb (ref <5.7)
MEAN PLASMA GLUCOSE: 108 (calc)

## 2018-06-08 LAB — TSH: TSH: 1.56 m[IU]/L (ref 0.40–4.50)

## 2018-06-13 ENCOUNTER — Other Ambulatory Visit: Payer: Self-pay | Admitting: Pain Medicine

## 2018-06-13 DIAGNOSIS — M25511 Pain in right shoulder: Secondary | ICD-10-CM

## 2018-06-16 DIAGNOSIS — C73 Malignant neoplasm of thyroid gland: Secondary | ICD-10-CM | POA: Diagnosis not present

## 2018-06-16 DIAGNOSIS — E890 Postprocedural hypothyroidism: Secondary | ICD-10-CM | POA: Diagnosis not present

## 2018-06-16 DIAGNOSIS — R7301 Impaired fasting glucose: Secondary | ICD-10-CM | POA: Diagnosis not present

## 2018-06-20 ENCOUNTER — Ambulatory Visit (INDEPENDENT_AMBULATORY_CARE_PROVIDER_SITE_OTHER): Payer: Medicare Other | Admitting: Adult Health

## 2018-06-20 ENCOUNTER — Encounter: Payer: Self-pay | Admitting: Adult Health

## 2018-06-20 VITALS — BP 126/74 | HR 76 | Temp 97.3°F | Ht 62.5 in | Wt 199.0 lb

## 2018-06-20 DIAGNOSIS — C73 Malignant neoplasm of thyroid gland: Secondary | ICD-10-CM | POA: Diagnosis not present

## 2018-06-20 DIAGNOSIS — R7301 Impaired fasting glucose: Secondary | ICD-10-CM | POA: Diagnosis not present

## 2018-06-20 DIAGNOSIS — E890 Postprocedural hypothyroidism: Secondary | ICD-10-CM | POA: Diagnosis not present

## 2018-06-20 DIAGNOSIS — R10813 Right lower quadrant abdominal tenderness: Secondary | ICD-10-CM

## 2018-06-20 DIAGNOSIS — R1031 Right lower quadrant pain: Secondary | ICD-10-CM

## 2018-06-20 MED ORDER — METRONIDAZOLE 500 MG PO TABS: 500.0000 mg | ORAL_TABLET | Freq: Three times a day (TID) | ORAL | 0 refills | Status: DC

## 2018-06-20 MED ORDER — DOXYCYCLINE HYCLATE 100 MG PO CAPS: ORAL_CAPSULE | ORAL | 0 refills | Status: DC

## 2018-06-20 NOTE — Patient Instructions (Signed)
We are checking for diverticulitis, UTI, abscess, appendicitis  Please go to ER if worsening symptoms prior to Friday, severe fever, emesis   Diverticulitis Diverticulitis is infection or inflammation of small pouches (diverticula) in the colon that form due to a condition called diverticulosis. Diverticula can trap stool (feces) and bacteria, causing infection and inflammation. Diverticulitis may cause severe stomach pain and diarrhea. It may lead to tissue damage in the colon that causes bleeding. The diverticula may also burst (rupture) and cause infected stool to enter other areas of the abdomen. Complications of diverticulitis can include:  Bleeding.  Severe infection.  Severe pain.  Rupture (perforation) of the colon.  Blockage (obstruction) of the colon.  What are the causes? This condition is caused by stool becoming trapped in the diverticula, which allows bacteria to grow in the diverticula. This leads to inflammation and infection. What increases the risk? You are more likely to develop this condition if:  You have diverticulosis. The risk for diverticulosis increases if: ? You are overweight or obese. ? You use tobacco products. ? You do not get enough exercise.  You eat a diet that does not include enough fiber. High-fiber foods include fruits, vegetables, beans, nuts, and whole grains.  What are the signs or symptoms? Symptoms of this condition may include:  Pain and tenderness in the abdomen. The pain is normally located on the left side of the abdomen, but it may occur in other areas.  Fever and chills.  Bloating.  Cramping.  Nausea.  Vomiting.  Changes in bowel routines.  Blood in your stool.  How is this diagnosed? This condition is diagnosed based on:  Your medical history.  A physical exam.  Tests to make sure there is nothing else causing your condition. These tests may include: ? Blood tests. ? Urine tests. ? Imaging tests of the  abdomen, including X-rays, ultrasounds, MRIs, or CT scans.  How is this treated? Most cases of this condition are mild and can be treated at home. Treatment may include:  Taking over-the-counter pain medicines.  Following a clear liquid diet.  Taking antibiotic medicines by mouth.  Rest.  More severe cases may need to be treated at a hospital. Treatment may include:  Not eating or drinking.  Taking prescription pain medicine.  Receiving antibiotic medicines through an IV tube.  Receiving fluids and nutrition through an IV tube.  Surgery.  When your condition is under control, your health care provider may recommend that you have a colonoscopy. This is an exam to look at the entire large intestine. During the exam, a lubricated, bendable tube is inserted into the anus and then passed into the rectum, colon, and other parts of the large intestine. A colonoscopy can show how severe your diverticula are and whether something else may be causing your symptoms. Follow these instructions at home: Medicines  Take over-the-counter and prescription medicines only as told by your health care provider. These include fiber supplements, probiotics, and stool softeners.  If you were prescribed an antibiotic medicine, take it as told by your health care provider. Do not stop taking the antibiotic even if you start to feel better.  Do not drive or use heavy machinery while taking prescription pain medicine. General instructions  Follow a full liquid diet or another diet as directed by your health care provider. After your symptoms improve, your health care provider may tell you to change your diet. He or she may recommend that you eat a diet that contains at  least 25 g (25 grams) of fiber daily. Fiber makes it easier to pass stool. Healthy sources of fiber include: ? Berries. One cup contains 4-8 grams of fiber. ? Beans or lentils. One half cup contains 5-8 grams of fiber. ? Green vegetables.  One cup contains 4 grams of fiber.  Exercise for at least 30 minutes, 3 times each week. You should exercise hard enough to raise your heart rate and break a sweat.  Keep all follow-up visits as told by your health care provider. This is important. You may need a colonoscopy. Contact a health care provider if:  Your pain does not improve.  You have a hard time drinking or eating food.  Your bowel movements do not return to normal. Get help right away if:  Your pain gets worse.  Your symptoms do not get better with treatment.  Your symptoms suddenly get worse.  You have a fever.  You vomit more than one time.  You have stools that are bloody, black, or tarry. Summary  Diverticulitis is infection or inflammation of small pouches (diverticula) in the colon that form due to a condition called diverticulosis. Diverticula can trap stool (feces) and bacteria, causing infection and inflammation.  You are at higher risk for this condition if you have diverticulosis and you eat a diet that does not include enough fiber.  Most cases of this condition are mild and can be treated at home. More severe cases may need to be treated at a hospital.  When your condition is under control, your health care provider may recommend that you have an exam called a colonoscopy. This exam can show how severe your diverticula are and whether something else may be causing your symptoms. This information is not intended to replace advice given to you by your health care provider. Make sure you discuss any questions you have with your health care provider. Document Released: 06/02/2005 Document Revised: 09/25/2016 Document Reviewed: 09/25/2016 Elsevier Interactive Patient Education  [employer].

## 2018-06-20 NOTE — Progress Notes (Signed)
Assessment and Plan:  Autumn Frye was seen today for abdominal pain.  Diagnoses and all orders for this visit:  Right lower quadrant abdominal tenderness without rebound tenderness ? Appendicitis vs diverticulosis/abscess, will proceed with diverticulitis meds Recommended mild diet, will obtain imaging Please go to the ER if you have any severe AB pain, unable to hold down food/water, blood in stool or vomit, chest pain, shortness of breath, or any worsening symptoms.  -     CBC with Differential/Platelet -     Urinalysis w microscopic + reflex cultur -     COMPLETE METABOLIC PANEL WITH GFR -     metroNIDAZOLE (FLAGYL) 500 MG tablet; Take 1 tablet (500 mg total) by mouth 3 (three) times daily. -     doxycycline (VIBRAMYCIN) 100 MG capsule; Take 1 capsule twice daily with food -     CT Abdomen Pelvis W Contrast; Future  Further disposition pending results of labs. Discussed med's effects and SE's.   Over 15 minutes of exam, counseling, chart review, and critical decision making was performed.   Future Appointments  Date Time Provider [place]  06/20/2018  2:45 PM Liane Comber, NP [facility] None  06/22/2018  8:10 AM [facility] MR 1 [facility] [address] [facility]  09/15/2018 10:30 AM Unk Pinto, MD [facility] None  03/13/2019  9:00 AM Unk Pinto, MD [facility] None    ------------------------------------------------------------------------------------------------------------------  HPI BP 126/74   Pulse 76   Temp (!) 97.3 F (36.3 C)   Ht 5' 2.5" (1.588 m)   Wt 199 lb (90.3 kg)   LMP  (LMP Unknown)   SpO2 97%   BMI 35.82 kg/m   57 y.o.female presents for lower abdominal pain, worse on RLQ side but spreading to suprapubic area, has had severe gas, loose stools, then very thin stools x 1 week. She endorse she was more constipated than normal and increased. She endorses feeling "terrible," feeling constantly exhausted. She also endorses nausea. She has been eating light -  cottage cheese and blueberries today. She denies She reports pain is achy/stabbing pain, constant, non-radiating, worse with BMs or any straining or jostling. She denies vaginal discharge, changes in urinary character. She reports having mild chills in the evenings. Denies fever. Endorses nausea without vomiting.   Last colonoscopy 10/2011 with non-adenomatous polyps, mild diverticulosis  Last CT 06/2017 - overall unremarkable excepting diverticula, s/p vaginal hysterectomy, ovaries intact.   Past Medical History:  Diagnosis Date  . Arthritis    OA, RA  . Asthma    with a cold  . Autoimmune deficiency syndrome ([place])   . Back pain   . Cancer ([place])    thyroid-1995  . Family history of adverse reaction to anesthesia    mother problems with n/v  . Fibromyalgia   . GERD (gastroesophageal reflux disease)   . Hepatitis    'A" when pt. was 12 yrs. old  . Hiatal hernia   . Hyperlipidemia   . Hypertension   . Hypothyroidism   . Pneumonia      Allergies  Allergen Reactions  . Ciprofloxacin Hives and Swelling    SWELLING REACTION UNSPECIFIED   . Levaquin [Levofloxacin] Hives  . Lyrica [Pregabalin] Other (See Comments)    Altered mental status ALTERED MENTAL STATUS HALLUCINATIONS  . Compazine [Prochlorperazine Edisylate]     UNSPECIFIED REACTION   . Hydrocodone Itching  . Morphine And Related Itching  . Penicillins Itching  . Sulfa Antibiotics Itching    Current Outpatient Medications on File Prior to  Visit  Medication Sig  . Abatacept 125 MG/ML SOSY Inject 1 Syringe into the skin every 7 (seven) days.  Marland Kitchen acetaminophen (TYLENOL) 500 MG tablet Take 1 tablet (500 mg total) by mouth every 6 (six) hours as needed.  . Acetylcysteine (N-ACETYL-L-CYSTEINE PO) Take 1 capsule by mouth daily.  Marland Kitchen albuterol (PROAIR HFA) 108 (90 Base) MCG/ACT inhaler Inhale 1-2 puffs into the lungs every 6 (six) hours as needed for wheezing or shortness of breath. Reported on 03/12/2016  . albuterol  (PROVENTIL) (2.5 MG/3ML) 0.083% nebulizer solution Take 3 mLs (2.5 mg total) by nebulization every 6 (six) hours as needed for wheezing or shortness of breath.  Marland Kitchen amitriptyline (ELAVIL) 25 MG tablet Take 25 mg by mouth at bedtime.   . AVENOVA 0.01 % SOLN Place 1 application into both eyes 2 (two) times daily. Apply to eyelids BID  . buprenorphine (BUTRANS) 10 MCG/HR PTWK patch Place 15 mcg onto the skin once a week.   . Cholecalciferol (VITAMIN D3) 5000 UNITS TABS Take 5,000 Units by mouth daily.   Marland Kitchen ezetimibe (ZETIA) 10 MG tablet TAKE 1 TABLET BY MOUTH DAILY FOR CHOLESTEROL  . FEMRING 0.1 MG/24HR RING INSERT 1 RING VAGINALLY EVERY 3 MONTHS  . furosemide (LASIX) 40 MG tablet TAKE 1 TABLET BY MOUTH THREE TIMES DAILY FOR FLUID RETENTION (Patient taking differently: TAKE 1 TABLET BY MOUTH daily      takes as needed)  . L-GLUTAMINE PO Take 1 capsule by mouth daily.  Marland Kitchen l-methylfolate-B6-B12 (METANX) 3-35-2 MG TABS tablet Take 1 tablet by mouth daily.  Marland Kitchen levothyroxine (SYNTHROID) 125 MCG tablet Take 1 tablet (125 mcg total) by mouth daily.  Marland Kitchen liothyronine (CYTOMEL) 5 MCG tablet TAKE 1 TABLET BY MOUTH EVERY DAY  . LOTEMAX 0.5 % GEL Place 1 drop into both eyes 3 (three) times daily.   . Magnesium Oxide 500 MG TABS Take 500 mg by mouth daily.  . Menaquinone-7 (VITAMIN K2) 100 MCG CAPS Take 1 capsule by mouth daily.  . metFORMIN (GLUCOPHAGE-XR) 500 MG 24 hr tablet Take 2 tablets 2 x/ day for Diabetes (Patient taking differently: Take 1 tablet by mouth after dinner)  . methocarbamol (ROBAXIN) 750 MG tablet Take 1 tablet (750 mg total) by mouth 4 (four) times daily. (Patient taking differently: Take 750 mg by mouth as needed. )  . MILK THISTLE EXTRACT PO Take 2 capsules by mouth daily.  . Omega-3 Fatty Acids (FISH OIL) 1200 MG CAPS Take 2 capsules by mouth daily.   Vladimir Faster Glycol-Propyl Glycol (SYSTANE) 0.4-0.3 % SOLN Place 1 drop into both eyes 4 (four) times daily.   . RESTASIS 0.05 % ophthalmic  emulsion Place 1 drop into both eyes daily.   . rosuvastatin (CRESTOR) 40 MG tablet Take 1/2-1 tab daily or as directed for cholesterol.  . TURMERIC CURCUMIN PO Take 900 mg elemental calcium/kg/hr by mouth 2 (two) times daily.   No current facility-administered medications on file prior to visit.     ROS: all negative except above.   Physical Exam:  LMP  (LMP Unknown)   General Appearance: Well nourished, in no apparent distress. Eyes: PERRLA, conjunctiva no swelling or erythema ENT/Mouth: No erythema, swelling, or exudate on post pharynx.  Tonsils not swollen or erythematous. Hearing normal.  Neck: Supple, thyroid normal.  Respiratory: Respiratory effort normal, BS equal bilaterally without rales, rhonchi, wheezing or stridor.  Cardio: RRR with no MRGs. Brisk peripheral pulses without edema.  Abdomen: Soft, + BS.  RLQ tenderness with light palpation  without rebound tenderness, palpable mass, hernia.  Lymphatics: Non tender without lymphadenopathy.  Musculoskeletal: normal gait.  Skin: Warm, dry without rashes, lesions, ecchymosis.  Psych: Awake and oriented X 3, normal affect, Insight and Judgment appropriate.     Izora Ribas, NP 2:41 PM [facility]

## 2018-06-21 LAB — COMPLETE METABOLIC PANEL WITH GFR
AG Ratio: 1.7 (calc) (ref 1.0–2.5)
ALT: 18 U/L (ref 6–29)
AST: 19 U/L (ref 10–35)
Albumin: 4.2 g/dL (ref 3.6–5.1)
Alkaline phosphatase (APISO): 63 U/L (ref 33–130)
BUN: 12 mg/dL (ref 7–25)
CALCIUM: 9.4 mg/dL (ref 8.6–10.4)
CO2: 31 mmol/L (ref 20–32)
Chloride: 103 mmol/L (ref 98–110)
Creat: 0.6 mg/dL (ref 0.50–1.05)
GFR, EST AFRICAN AMERICAN: 117 mL/min/{1.73_m2} (ref >=60)
GFR, Est Non African American: 101 mL/min/{1.73_m2} (ref >=60)
GLUCOSE: 84 mg/dL (ref 65–99)
Globulin: 2.5 g/dL (calc) (ref 1.9–3.7)
Potassium: 4.6 mmol/L (ref 3.5–5.3)
Sodium: 137 mmol/L (ref 135–146)
TOTAL PROTEIN: 6.7 g/dL (ref 6.1–8.1)
Total Bilirubin: 0.3 mg/dL (ref 0.2–1.2)

## 2018-06-21 LAB — CBC WITH DIFFERENTIAL/PLATELET
BASOS ABS: 20 {cells}/uL (ref 0–200)
Basophils Relative: 0.3 %
EOS ABS: 7 {cells}/uL — AB (ref 15–500)
Eosinophils Relative: 0.1 %
HEMATOCRIT: 38.7 % (ref 35.0–45.0)
Hemoglobin: 12.9 g/dL (ref 11.7–15.5)
LYMPHS ABS: 3477 {cells}/uL (ref 850–3900)
MCH: 28.6 pg (ref 27.0–33.0)
MCHC: 33.3 g/dL (ref 32.0–36.0)
MCV: 85.8 fL (ref 80.0–100.0)
MPV: 10 fL (ref 7.5–12.5)
Monocytes Relative: 6.3 %
Neutro Abs: 2774 cells/uL (ref 1500–7800)
Neutrophils Relative %: 41.4 %
Platelets: 341 10*3/uL (ref 140–400)
RBC: 4.51 10*6/uL (ref 3.80–5.10)
RDW: 13.1 % (ref 11.0–15.0)
Total Lymphocyte: 51.9 %
WBC mixed population: 422 cells/uL (ref 200–950)
WBC: 6.7 10*3/uL (ref 3.8–10.8)

## 2018-06-21 LAB — URINALYSIS W MICROSCOPIC + REFLEX CULTURE
BILIRUBIN URINE: NEGATIVE
Bacteria, UA: NONE SEEN /HPF
Glucose, UA: NEGATIVE
HYALINE CAST: NONE SEEN /LPF
Ketones, ur: NEGATIVE
Leukocyte Esterase: NEGATIVE
Nitrites, Initial: NEGATIVE
Specific Gravity, Urine: 1.021 (ref 1.001–1.03)
WBC, UA: NONE SEEN /HPF (ref 0–5)
pH: 7 (ref 5.0–8.0)

## 2018-06-21 LAB — NO CULTURE INDICATED

## 2018-06-22 ENCOUNTER — Ambulatory Visit
Admission: RE | Admit: 2018-06-22 | Discharge: 2018-06-22 | Disposition: A | Discharge status: Home / Self Care | Payer: Medicare Other | Source: Ambulatory Visit | Attending: Pain Medicine | Admitting: Pain Medicine

## 2018-06-22 ENCOUNTER — Ambulatory Visit
Admission: RE | Admit: 2018-06-22 | Discharge: 2018-06-22 | Disposition: A | Discharge status: Home / Self Care | Payer: Medicare Other | Source: Ambulatory Visit | Attending: Adult Health | Admitting: Adult Health

## 2018-06-22 DIAGNOSIS — K5730 Diverticulosis of large intestine without perforation or abscess without bleeding: Secondary | ICD-10-CM | POA: Diagnosis not present

## 2018-06-22 DIAGNOSIS — R1031 Right lower quadrant pain: Secondary | ICD-10-CM

## 2018-06-22 DIAGNOSIS — M25511 Pain in right shoulder: Secondary | ICD-10-CM

## 2018-06-22 DIAGNOSIS — R10813 Right lower quadrant abdominal tenderness: Secondary | ICD-10-CM

## 2018-06-22 MED ORDER — IOPAMIDOL (ISOVUE-300) INJECTION 61%
100.0000 mL | Freq: Once | INTRAVENOUS | Status: AC | PRN
  Administered 2018-06-22: 100 mL via INTRAVENOUS

## 2018-07-04 DIAGNOSIS — M65831 Other synovitis and tenosynovitis, right forearm: Secondary | ICD-10-CM | POA: Diagnosis not present

## 2018-07-06 ENCOUNTER — Other Ambulatory Visit: Payer: Self-pay | Admitting: Internal Medicine

## 2018-07-06 ENCOUNTER — Other Ambulatory Visit: Payer: Self-pay | Admitting: Physician Assistant

## 2018-07-06 DIAGNOSIS — M069 Rheumatoid arthritis, unspecified: Secondary | ICD-10-CM | POA: Diagnosis not present

## 2018-07-06 DIAGNOSIS — Z79891 Long term (current) use of opiate analgesic: Secondary | ICD-10-CM | POA: Diagnosis not present

## 2018-07-06 DIAGNOSIS — M25511 Pain in right shoulder: Secondary | ICD-10-CM | POA: Diagnosis not present

## 2018-07-06 DIAGNOSIS — Z79899 Other long term (current) drug therapy: Secondary | ICD-10-CM | POA: Diagnosis not present

## 2018-07-06 DIAGNOSIS — M545 Low back pain: Secondary | ICD-10-CM | POA: Diagnosis not present

## 2018-07-06 DIAGNOSIS — G894 Chronic pain syndrome: Secondary | ICD-10-CM | POA: Diagnosis not present

## 2018-07-27 DIAGNOSIS — M25511 Pain in right shoulder: Secondary | ICD-10-CM | POA: Diagnosis not present

## 2018-07-27 DIAGNOSIS — M545 Low back pain: Secondary | ICD-10-CM | POA: Diagnosis not present

## 2018-07-27 DIAGNOSIS — M069 Rheumatoid arthritis, unspecified: Secondary | ICD-10-CM | POA: Diagnosis not present

## 2018-07-27 DIAGNOSIS — G894 Chronic pain syndrome: Secondary | ICD-10-CM | POA: Diagnosis not present

## 2018-08-11 DIAGNOSIS — M65831 Other synovitis and tenosynovitis, right forearm: Secondary | ICD-10-CM | POA: Diagnosis not present

## 2018-08-21 DIAGNOSIS — E890 Postprocedural hypothyroidism: Secondary | ICD-10-CM | POA: Diagnosis not present

## 2018-08-31 DIAGNOSIS — M545 Low back pain: Secondary | ICD-10-CM | POA: Diagnosis not present

## 2018-08-31 DIAGNOSIS — M25511 Pain in right shoulder: Secondary | ICD-10-CM | POA: Diagnosis not present

## 2018-08-31 DIAGNOSIS — G894 Chronic pain syndrome: Secondary | ICD-10-CM | POA: Diagnosis not present

## 2018-08-31 DIAGNOSIS — Z79891 Long term (current) use of opiate analgesic: Secondary | ICD-10-CM | POA: Diagnosis not present

## 2018-08-31 DIAGNOSIS — M069 Rheumatoid arthritis, unspecified: Secondary | ICD-10-CM | POA: Diagnosis not present

## 2018-08-31 DIAGNOSIS — Z79899 Other long term (current) drug therapy: Secondary | ICD-10-CM | POA: Diagnosis not present

## 2018-09-01 ENCOUNTER — Ambulatory Visit: Payer: Self-pay | Admitting: Internal Medicine

## 2018-09-14 ENCOUNTER — Encounter: Payer: Self-pay | Admitting: Internal Medicine

## 2018-09-14 NOTE — Patient Instructions (Signed)

## 2018-09-14 NOTE — Progress Notes (Signed)
This very nice 58 y.o. MWF presents for 6 month follow up with HTN, HLD, Pre-Diabetes and Vitamin D Deficiency. Patient has hx/o Rheumatoid Arthritis dx'd 2009 & is followed by Dr Dossie Der.     Patient is treated for HTN (2000) & BP has been controlled at home. Today's BP is at goal - 118/80. Patient has had no complaints of any cardiac type chest pain, palpitations, dyspnea / orthopnea / PND, dizziness, claudication, or dependent edema.     Her Hyperlipidemia is uncontrolled with her intolerance to cholesterol meds. Patient denies myalgias or other med SE's. Last Lipids were not at goal: Lab Results  Component Value Date   CHOL 248 (H) 09/15/2018   HDL 61 09/15/2018   LDLCALC 157 (H) 09/15/2018   TRIG 163 (H) 09/15/2018   CHOLHDL 4.1 09/15/2018      Also, the patient has Morbid Obesity (BMI 35+) and history of PreDiabetes  (A1c 5.9% / 2010)  and has had no symptoms of reactive hypoglycemia, diabetic polys, paresthesias or visual blurring.  Last A1c was Normal & at goal: Lab Results  Component Value Date   HGBA1C 5.4 09/15/2018      Patient has been on Thyroid Replacement since Goiter surgery in 1995.     Further, the patient also has history of Vitamin D Deficiency("27" / 2009)  and supplements vitamin D without any suspected side-effects. Last vitamin D was not at goal (70-100):  Lab Results  Component Value Date   VD25OH 65 09/15/2018   Current Outpatient Medications on File Prior to Visit  Medication Sig  . Abatacept 125 MG/ML SOSY Inject 1 Syringe into the skin every 7 (seven) days.  Marland Kitchen acetaminophen (TYLENOL) 500 MG tablet Take 1 tablet (500 mg total) by mouth every 6 (six) hours as needed.  . Acetylcysteine (N-ACETYL-L-CYSTEINE PO) Take 1 capsule by mouth daily.  Marland Kitchen albuterol (PROAIR HFA) 108 (90 Base) MCG/ACT inhaler Inhale 1-2 puffs into the lungs every 6 (six) hours as needed for wheezing or shortness of breath. Reported on 03/12/2016  . albuterol (PROVENTIL) (2.5 MG/3ML)  0.083% nebulizer solution Take 3 mLs (2.5 mg total) by nebulization every 6 (six) hours as needed for wheezing or shortness of breath.  Marland Kitchen amitriptyline (ELAVIL) 25 MG tablet Take 25 mg by mouth at bedtime.   . AVENOVA 0.01 % SOLN Place 1 application into both eyes 2 (two) times daily. Apply to eyelids BID  . buprenorphine (BUTRANS) 10 MCG/HR PTWK patch Place 15 mcg onto the skin once a week.   . Cholecalciferol (VITAMIN D3) 5000 UNITS TABS Take 10,000 Units by mouth daily.   Marland Kitchen ezetimibe (ZETIA) 10 MG tablet TAKE 1 TABLET BY MOUTH DAILY FOR CHOLESTEROL  . FEMRING 0.1 MG/24HR RING INSERT 1 RING VAGINALLY EVERY 3 MONTHS  . furosemide (LASIX) 40 MG tablet TAKE 1 TABLET BY MOUTH THREE TIMES DAILY FOR FLUID RETENTION (Patient taking differently: TAKE 1 TABLET BY MOUTH daily      takes as needed)  . L-GLUTAMINE PO Take 1 capsule by mouth daily.  Marland Kitchen l-methylfolate-B6-B12 (METANX) 3-35-2 MG TABS tablet Take 1 tablet by mouth daily.  Marland Kitchen levothyroxine (SYNTHROID) 125 MCG tablet Take 1 tablet (125 mcg total) by mouth daily.  Marland Kitchen liothyronine (CYTOMEL) 5 MCG tablet TAKE 1 TABLET BY MOUTH EVERY DAY  . LOTEMAX 0.5 % GEL Place 1 drop into both eyes 3 (three) times daily.   . Magnesium Oxide 500 MG TABS Take 500 mg by mouth daily.  Marland Kitchen  Menaquinone-7 (VITAMIN K2) 100 MCG CAPS Take 1 capsule by mouth daily.  . metFORMIN (GLUCOPHAGE-XR) 500 MG 24 hr tablet Take 2 tablets 2 x/ day for Diabetes (Patient taking differently: Take 1 tablet by mouth after dinner)  . methocarbamol (ROBAXIN) 750 MG tablet Take 750 mg by mouth 4 (four) times daily.  Marland Kitchen MILK THISTLE EXTRACT PO Take 2 capsules by mouth daily.  . Omega-3 Fatty Acids (FISH OIL) 1200 MG CAPS Take 2 capsules by mouth daily.   Vladimir Faster Glycol-Propyl Glycol (SYSTANE) 0.4-0.3 % SOLN Place 1 drop into both eyes 4 (four) times daily.   . RESTASIS 0.05 % ophthalmic emulsion Place 1 drop into both eyes daily.   . TURMERIC CURCUMIN PO Take 900 mg elemental calcium/kg/hr by  mouth 2 (two) times daily.   No current facility-administered medications on file prior to visit.    Allergies  Allergen Reactions  . Ciprofloxacin Hives and Swelling    SWELLING REACTION UNSPECIFIED   . Levaquin [Levofloxacin] Hives  . Lyrica [Pregabalin] Other (See Comments)    Altered mental status ALTERED MENTAL STATUS HALLUCINATIONS  . Compazine [Prochlorperazine Edisylate]     UNSPECIFIED REACTION   . Hydrocodone Itching  . Morphine And Related Itching  . Penicillins Itching  . Sulfa Antibiotics Itching   PMHx:   Past Medical History:  Diagnosis Date  . Arthritis    OA, RA  . Asthma    with a cold  . Autoimmune deficiency syndrome ([place])   . Back pain   . Cancer ([place])    thyroid-1995  . Family history of adverse reaction to anesthesia    mother problems with n/v  . Fibromyalgia   . GERD (gastroesophageal reflux disease)   . Hepatitis    'A" when pt. was 12 yrs. old  . Hiatal hernia   . Hyperlipidemia   . Hypertension   . Hypothyroidism   . Pneumonia    Immunization History  Administered Date(s) Administered  . DTaP 09/15/2012, 09/15/2012  . Influenza Inj Mdck Quad With Preservative 08/02/2017  . Influenza Whole 09/06/2012  . Influenza-Unspecified 05/07/2014, 05/20/2016  . Pneumococcal Conjugate-13 09/15/2012, 07/05/2014  . Zoster 09/03/2013   Past Surgical History:  Procedure Laterality Date  . ANTERIOR CERVICAL DECOMP/DISCECTOMY FUSION N/A 05/30/2017   Procedure: Cervical five-six, Cervical six-seven Anterior discectomy with fusion and plate fixation;  Surgeon: Ditty, Kevan Ny, MD;  Location: [place];  Service: Neurosurgery;  Laterality: N/A;  . ANTERIOR LAT LUMBAR FUSION Left 01/26/2013   Procedure: ANTERIOR LATERAL LUMBAR FUSION 1 LEVEL;  Surgeon: Faythe Ghee, MD;  Location: [facility];  Service: Neurosurgery;  Laterality: Left;  lumbar four-five  . KNEE ARTHROPLASTY Left 02/16/2016   Procedure: COMPUTER ASSISTED TOTAL KNEE ARTHROPLASTY;   Surgeon: Dereck Leep, MD;  Location: [facility];  Service: Orthopedics;  Laterality: Left;  . KNEE SURGERY     3 on right knee, 1 on left-prior to TKR  . LUMBAR PERCUTANEOUS PEDICLE SCREW 1 LEVEL Left 01/26/2013   Procedure: LUMBAR PERCUTANEOUS PEDICLE SCREW 1 LEVEL;  Surgeon: Faythe Ghee, MD;  Location: [facility];  Service: Neurosurgery;  Laterality: Left;  lumbar four-five  . PILONIDAL CYST EXCISION    . THYROIDECTOMY    . TONSILLECTOMY    . TOTAL KNEE ARTHROPLASTY     right  . UPPER GASTROINTESTINAL ENDOSCOPY    . VAGINAL HYSTERECTOMY     FHx:    Reviewed / unchanged  SHx:    Reviewed / unchanged  Systems Review:  Constitutional: Denies fever, chills, wt changes, headaches, insomnia, fatigue, night sweats, change in appetite. Eyes: Denies redness, blurred vision, diplopia, discharge, itchy, watery eyes.  ENT: Denies discharge, congestion, post nasal drip, epistaxis, sore throat, earache, hearing loss, dental pain, tinnitus, vertigo, sinus pain, snoring.  CV: Denies chest pain, palpitations, irregular heartbeat, syncope, dyspnea, diaphoresis, orthopnea, PND, claudication or edema. Respiratory: denies cough, dyspnea, DOE, pleurisy, hoarseness, laryngitis, wheezing.  Gastrointestinal: Denies dysphagia, odynophagia, heartburn, reflux, water brash, abdominal pain or cramps, nausea, vomiting, bloating, diarrhea, constipation, hematemesis, melena, hematochezia  or hemorrhoids. Genitourinary: Denies dysuria, frequency, urgency, nocturia, hesitancy, discharge, hematuria or flank pain. Musculoskeletal: Denies arthralgias, myalgias, stiffness, jt. swelling, pain, limping or strain/sprain.  Skin: Denies pruritus, rash, hives, warts, acne, eczema or change in skin lesion(s). Neuro: No weakness, tremor, incoordination, spasms, paresthesia or pain. Psychiatric: Denies confusion, memory loss or sensory loss. Endo: Denies change in weight, skin or hair change.  Heme/Lymph: No excessive  bleeding, bruising or enlarged lymph nodes.  Physical Exam  BP 118/80   Pulse 72   Temp (!) 97.4 F (36.3 C)   Resp 16   Ht 5' 2.5" (1.588 m)   Wt 204 lb 3.2 oz (92.6 kg)   LMP  (LMP Unknown)   BMI 36.75 kg/m   Appears  well nourished, well groomed  and in no distress.  Eyes: PERRLA, EOMs, conjunctiva no swelling or erythema. Sinuses: No frontal/maxillary tenderness ENT/Mouth: EAC's clear, TM's nl w/o erythema, bulging. Nares clear w/o erythema, swelling, exudates. Oropharynx clear without erythema or exudates. Oral hygiene is good. Tongue normal, non obstructing. Hearing intact.  Neck: Supple. Thyroid not palpable. Car 2+/2+ without bruits, nodes or JVD. Chest: Respirations nl with BS clear & equal w/o rales, rhonchi, wheezing or stridor.  Cor: Heart sounds normal w/ regular rate and rhythm without sig. murmurs, gallops, clicks or rubs. Peripheral pulses normal and equal  without edema.  Abdomen: Soft & bowel sounds normal. Non-tender w/o guarding, rebound, hernias, masses or organomegaly.  Lymphatics: Unremarkable.  Musculoskeletal: Full ROM all peripheral extremities, joint stability, 5/5 strength and normal gait.  Skin: Warm, dry without exposed rashes, lesions or ecchymosis apparent.  Neuro: Cranial nerves intact, reflexes equal bilaterally. Sensory-motor testing grossly intact. Tendon reflexes grossly intact.  Pysch: Alert & oriented x 3.  Insight and judgement nl & appropriate. No ideations.  Assessment and Plan:  1. Essential hypertension  - Continue medication, monitor blood pressure at home.  - Continue DASH diet.  Reminder to go to the ER if any CP,  SOB, nausea, dizziness, severe HA, changes vision/speech.  - CBC with Differential/Platelet - COMPLETE METABOLIC PANEL WITH GFR - Magnesium - TSH  2. Hyperlipidemia, mixed  - Continue diet/meds, exercise,& lifestyle modifications.  - Continue monitor periodic cholesterol/liver & renal functions   - Lipid  panel - TSH  3. Abnormal glucose  - Continue diet, exercise  - Lifestyle modifications.  - Monitor appropriate labs.  - Hemoglobin A1c - Insulin, random  4. Vitamin D deficiency  - Continue supplementation.  - VITAMIN D 25 Hydroxyl  5. Hypothyroidism  - TSH  6. Rheumatoid arthritis involving multiple sites ([place])   7. Medication management  - CBC with Differential/Platelet - COMPLETE METABOLIC PANEL WITH GFR - Magnesium - Lipid panel - TSH - Hemoglobin A1c - Insulin, random - VITAMIN D 25 Hydroxyl       Discussed  regular exercise, BP monitoring, weight control to achieve/maintain BMI less than 25 and discussed med and SE's. Recommended  labs to assess and monitor clinical status with further disposition pending results of labs. Over 30 minutes of exam, counseling, chart review was performed.

## 2018-09-15 ENCOUNTER — Ambulatory Visit (INDEPENDENT_AMBULATORY_CARE_PROVIDER_SITE_OTHER): Payer: Medicare Other | Admitting: Internal Medicine

## 2018-09-15 VITALS — BP 118/80 | HR 72 | Temp 97.4°F | Resp 16 | Ht 62.5 in | Wt 204.2 lb

## 2018-09-15 DIAGNOSIS — E782 Mixed hyperlipidemia: Secondary | ICD-10-CM | POA: Diagnosis not present

## 2018-09-15 DIAGNOSIS — E559 Vitamin D deficiency, unspecified: Secondary | ICD-10-CM | POA: Diagnosis not present

## 2018-09-15 DIAGNOSIS — R7309 Other abnormal glucose: Secondary | ICD-10-CM | POA: Diagnosis not present

## 2018-09-15 DIAGNOSIS — I10 Essential (primary) hypertension: Secondary | ICD-10-CM

## 2018-09-15 DIAGNOSIS — M069 Rheumatoid arthritis, unspecified: Secondary | ICD-10-CM

## 2018-09-15 DIAGNOSIS — Z79899 Other long term (current) drug therapy: Secondary | ICD-10-CM

## 2018-09-15 DIAGNOSIS — E039 Hypothyroidism, unspecified: Secondary | ICD-10-CM

## 2018-09-16 ENCOUNTER — Other Ambulatory Visit: Payer: Self-pay | Admitting: Internal Medicine

## 2018-09-16 DIAGNOSIS — E782 Mixed hyperlipidemia: Secondary | ICD-10-CM

## 2018-09-16 MED ORDER — GEMFIBROZIL 600 MG PO TABS: ORAL_TABLET | ORAL | 1 refills | Status: DC

## 2018-09-18 LAB — LIPID PANEL
CHOL/HDL RATIO: 4.1 (calc) (ref <5.0)
Cholesterol: 248 mg/dL — ABNORMAL HIGH (ref <200)
HDL: 61 mg/dL (ref >50)
LDL Cholesterol (Calc): 157 mg/dL (calc) — ABNORMAL HIGH
NON-HDL CHOLESTEROL (CALC): 187 mg/dL — AB (ref <130)
TRIGLYCERIDES: 163 mg/dL — AB (ref <150)

## 2018-09-18 LAB — COMPLETE METABOLIC PANEL WITH GFR
AG Ratio: 1.7 (calc) (ref 1.0–2.5)
ALT: 27 U/L (ref 6–29)
AST: 21 U/L (ref 10–35)
Albumin: 4.1 g/dL (ref 3.6–5.1)
Alkaline phosphatase (APISO): 64 U/L (ref 33–130)
BUN: 11 mg/dL (ref 7–25)
CALCIUM: 9.5 mg/dL (ref 8.6–10.4)
CHLORIDE: 104 mmol/L (ref 98–110)
CO2: 27 mmol/L (ref 20–32)
Creat: 0.58 mg/dL (ref 0.50–1.05)
GFR, EST AFRICAN AMERICAN: 119 mL/min/{1.73_m2} (ref >=60)
GFR, EST NON AFRICAN AMERICAN: 102 mL/min/{1.73_m2} (ref >=60)
GLUCOSE: 84 mg/dL (ref 65–99)
Globulin: 2.4 g/dL (calc) (ref 1.9–3.7)
Potassium: 4.3 mmol/L (ref 3.5–5.3)
Sodium: 139 mmol/L (ref 135–146)
TOTAL PROTEIN: 6.5 g/dL (ref 6.1–8.1)
Total Bilirubin: 0.3 mg/dL (ref 0.2–1.2)

## 2018-09-18 LAB — HEMOGLOBIN A1C
Hgb A1c MFr Bld: 5.4 % of total Hgb (ref <5.7)
Mean Plasma Glucose: 108 (calc)
eAG (mmol/L): 6 (calc)

## 2018-09-18 LAB — CBC WITH DIFFERENTIAL/PLATELET
Absolute Monocytes: 402 cells/uL (ref 200–950)
BASOS ABS: 18 {cells}/uL (ref 0–200)
BASOS PCT: 0.3 %
EOS ABS: 18 {cells}/uL (ref 15–500)
Eosinophils Relative: 0.3 %
HEMATOCRIT: 39.7 % (ref 35.0–45.0)
Hemoglobin: 12.9 g/dL (ref 11.7–15.5)
LYMPHS ABS: 3492 {cells}/uL (ref 850–3900)
MCH: 28.6 pg (ref 27.0–33.0)
MCHC: 32.5 g/dL (ref 32.0–36.0)
MCV: 88 fL (ref 80.0–100.0)
MPV: 10.1 fL (ref 7.5–12.5)
Monocytes Relative: 6.7 %
Neutro Abs: 2070 cells/uL (ref 1500–7800)
Neutrophils Relative %: 34.5 %
Platelets: 332 10*3/uL (ref 140–400)
RBC: 4.51 10*6/uL (ref 3.80–5.10)
RDW: 13.3 % (ref 11.0–15.0)
Total Lymphocyte: 58.2 %
WBC: 6 10*3/uL (ref 3.8–10.8)

## 2018-09-18 LAB — MAGNESIUM: Magnesium: 1.6 mg/dL (ref 1.5–2.5)

## 2018-09-18 LAB — INSULIN, RANDOM: Insulin: 8.3 u[IU]/mL (ref 2.0–19.6)

## 2018-09-18 LAB — TSH: TSH: 1.23 m[IU]/L (ref 0.40–4.50)

## 2018-09-18 LAB — VITAMIN D 25 HYDROXY (VIT D DEFICIENCY, FRACTURES): VIT D 25 HYDROXY: 65 ng/mL (ref 30–100)

## 2018-09-22 DIAGNOSIS — E785 Hyperlipidemia, unspecified: Secondary | ICD-10-CM | POA: Diagnosis not present

## 2018-09-22 DIAGNOSIS — N951 Menopausal and female climacteric states: Secondary | ICD-10-CM | POA: Diagnosis not present

## 2018-09-22 DIAGNOSIS — E890 Postprocedural hypothyroidism: Secondary | ICD-10-CM | POA: Diagnosis not present

## 2018-09-22 DIAGNOSIS — E119 Type 2 diabetes mellitus without complications: Secondary | ICD-10-CM | POA: Diagnosis not present

## 2018-09-26 DIAGNOSIS — E119 Type 2 diabetes mellitus without complications: Secondary | ICD-10-CM | POA: Diagnosis not present

## 2018-09-26 DIAGNOSIS — E890 Postprocedural hypothyroidism: Secondary | ICD-10-CM | POA: Diagnosis not present

## 2018-09-26 DIAGNOSIS — E785 Hyperlipidemia, unspecified: Secondary | ICD-10-CM | POA: Diagnosis not present

## 2018-09-26 DIAGNOSIS — N951 Menopausal and female climacteric states: Secondary | ICD-10-CM | POA: Diagnosis not present

## 2018-09-27 DIAGNOSIS — N951 Menopausal and female climacteric states: Secondary | ICD-10-CM | POA: Diagnosis not present

## 2018-09-27 DIAGNOSIS — R232 Flushing: Secondary | ICD-10-CM | POA: Diagnosis not present

## 2018-09-27 DIAGNOSIS — E119 Type 2 diabetes mellitus without complications: Secondary | ICD-10-CM | POA: Diagnosis not present

## 2018-09-28 DIAGNOSIS — M069 Rheumatoid arthritis, unspecified: Secondary | ICD-10-CM | POA: Diagnosis not present

## 2018-09-28 DIAGNOSIS — M545 Low back pain: Secondary | ICD-10-CM | POA: Diagnosis not present

## 2018-09-28 DIAGNOSIS — M25559 Pain in unspecified hip: Secondary | ICD-10-CM | POA: Diagnosis not present

## 2018-09-28 DIAGNOSIS — G894 Chronic pain syndrome: Secondary | ICD-10-CM | POA: Diagnosis not present

## 2018-10-26 DIAGNOSIS — M069 Rheumatoid arthritis, unspecified: Secondary | ICD-10-CM | POA: Diagnosis not present

## 2018-10-26 DIAGNOSIS — M25511 Pain in right shoulder: Secondary | ICD-10-CM | POA: Diagnosis not present

## 2018-10-26 DIAGNOSIS — Z79891 Long term (current) use of opiate analgesic: Secondary | ICD-10-CM | POA: Diagnosis not present

## 2018-10-26 DIAGNOSIS — G894 Chronic pain syndrome: Secondary | ICD-10-CM | POA: Diagnosis not present

## 2018-10-26 DIAGNOSIS — Z79899 Other long term (current) drug therapy: Secondary | ICD-10-CM | POA: Diagnosis not present

## 2018-11-20 DIAGNOSIS — M47817 Spondylosis without myelopathy or radiculopathy, lumbosacral region: Secondary | ICD-10-CM | POA: Diagnosis not present

## 2018-11-20 DIAGNOSIS — M25511 Pain in right shoulder: Secondary | ICD-10-CM | POA: Diagnosis not present

## 2018-11-20 DIAGNOSIS — M47816 Spondylosis without myelopathy or radiculopathy, lumbar region: Secondary | ICD-10-CM | POA: Diagnosis not present

## 2018-11-20 DIAGNOSIS — M069 Rheumatoid arthritis, unspecified: Secondary | ICD-10-CM | POA: Diagnosis not present

## 2018-11-20 DIAGNOSIS — G894 Chronic pain syndrome: Secondary | ICD-10-CM | POA: Diagnosis not present

## 2018-12-03 ENCOUNTER — Other Ambulatory Visit: Payer: Self-pay | Admitting: Internal Medicine

## 2018-12-13 DIAGNOSIS — Z79899 Other long term (current) drug therapy: Secondary | ICD-10-CM | POA: Diagnosis not present

## 2018-12-13 DIAGNOSIS — M0609 Rheumatoid arthritis without rheumatoid factor, multiple sites: Secondary | ICD-10-CM | POA: Diagnosis not present

## 2018-12-13 DIAGNOSIS — M25539 Pain in unspecified wrist: Secondary | ICD-10-CM | POA: Diagnosis not present

## 2018-12-13 DIAGNOSIS — M7551 Bursitis of right shoulder: Secondary | ICD-10-CM | POA: Diagnosis not present

## 2018-12-13 DIAGNOSIS — M797 Fibromyalgia: Secondary | ICD-10-CM | POA: Diagnosis not present

## 2018-12-17 NOTE — Progress Notes (Signed)
FOLLOW UP  Assessment and Plan:    Hypertension Well controlled at this time without medications Monitor blood pressure at home; patient to call if consistently greater than 130/80 Continue DASH diet.   Reminder to go to the ER if any CP, SOB, nausea, dizziness, severe HA, changes vision/speech, left arm numbness and tingling and jaw pain.  Cholesterol Currently above goal; she is off of all meds at this time; cannot recall why atorvastatin was d/c'd but she denies myalgias, elevated LFTs; discussed trial of rosuvastatin 40 mg which she is in agreement  Continue low cholesterol diet and exercise.  Check lipid panel.   Other abnormal glucose/ insulin resistance Recent A1Cs at goal on low dose metformin per endocrinology Continue diet and exercise.  Perform daily foot/skin check, notify office of any concerning changes.  Monitor A1C  Obesity with co morbidities Long discussion about weight loss, diet, and exercise Recommended diet heavy in fruits and veggies and low in animal meats, cheeses, and dairy products, appropriate calorie intake Discussed ideal weight for height - wt goal for this year is 140-145 lb Will follow up in 3 months  Hypothyroidism continue medications reminded to take on an empty stomach 30-41mins before food.  Defer TSH had checked by endocrinology recently   Vitamin D Def At goal at last visit; continue supplementation to maintain goal of 60-100 Defer Vit D level  Asthma Currently poorly controlled during allergy season;  Will trial rotating allergy medication, add singulair 10 mg daily x 1 week Breo sample given to try if still using albuterol more than twice a week She will message to report progress and can send in script for Breo 200/25 mg    Continue diet and meds as discussed. Further disposition pending results of labs. Discussed med's effects and SE's.   Over 30 minutes of exam, counseling, chart review, and critical decision making was  performed.   Future Appointments  Date Time Provider [place]  04/04/2019 11:00 AM Unk Pinto, MD [facility] None    ----------------------------------------------------------------------------------------------------------------------  HPI 58 y.o. female  presents for 3 month follow up on hypertension, cholesterol, glucose management, morbid obesity and vitamin D deficiency.  Patient is followed by Dr Dossie Der  for Rheumatoid Arthritis since 2009. Doing weekly abtacept weekly injections, also with buprenorphine weekly patch. She is discussing possible implanted stimulator (? Medtronic) for chronic back pain with Dr. Vira Blanco.   She is on PRN albuterol only for asthma; she is on claritin daily for allergies and vertigo. She admits since allergy season has been using albuterol BID most days these past few weeks. She is not on singulair or any daily inhaler.   BMI is Body mass index is 35.93 kg/m., she has been working on diet and exercise. Following very low carb but with vegetables. [facility], ankles hurt with walking, doing best to be active but limited. Seeing Dr. Sharol Given and Dr. Dossie Der. Has had injections.  Wt Readings from Last 3 Encounters:  12/18/18 199 lb 9.6 oz (90.5 kg)  09/15/18 204 lb 3.2 oz (92.6 kg)  06/20/18 199 lb (90.3 kg)   Her blood pressure has been controlled at home, today their BP is BP: 110/68  She does workout. She denies chest pain, shortness of breath, dizziness.   She is on cholesterol medication (gemfibrozil ran out a few weeks ago, hasn't picked up, previously on atorvastatin 40 mg and zetia 10 mg but hasn't been taking, no hx of myalgias with statin) and  denies myalgias. Her cholesterol is not at goal. The cholesterol last visit was:   Lab Results  Component Value Date   CHOL 248 (H) 09/15/2018   HDL 61 09/15/2018   LDLCALC 157 (H) 09/15/2018   TRIG 163 (H) 09/15/2018   CHOLHDL 4.1 09/15/2018    She has been working on diet and  exercise for glucose management (hx of prediabetes controlled in normal range on metformin 500 mg daily), and denies increased appetite, nausea, paresthesia of the feet, polydipsia, polyuria, visual disturbances, vomiting and weight loss. Last A1C in the office was:  Lab Results  Component Value Date   HGBA1C 5.4 09/15/2018   She is on thyroid medication. Her medication was not changed last visit.   Lab Results  Component Value Date   TSH 1.23 09/15/2018   Patient is on Vitamin D supplement and at goal at last check:   Lab Results  Component Value Date   VD25OH 65 09/15/2018        Current Medications:  Current Outpatient Medications on File Prior to Visit  Medication Sig  . Abatacept 125 MG/ML SOSY Inject 1 Syringe into the skin every 7 (seven) days.  Marland Kitchen acetaminophen (TYLENOL) 500 MG tablet Take 1 tablet (500 mg total) by mouth every 6 (six) hours as needed.  . Acetylcysteine (N-ACETYL-L-CYSTEINE PO) Take 1 capsule by mouth daily.  Marland Kitchen amitriptyline (ELAVIL) 25 MG tablet Take 25 mg by mouth at bedtime.   . buprenorphine (BUTRANS) 10 MCG/HR PTWK patch Place 15 mcg onto the skin once a week.   . Cholecalciferol (VITAMIN D3) 5000 UNITS TABS Take 10,000 Units by mouth daily.   Marland Kitchen FEMRING 0.1 MG/24HR RING INSERT 1 RING VAGINALLY EVERY 3 MONTHS  . furosemide (LASIX) 40 MG tablet TAKE 1 TABLET BY MOUTH THREE TIMES DAILY FOR FLUID RETENTION (Patient taking differently: TAKE 1 TABLET BY MOUTH daily      takes as needed)  . gemfibrozil (LOPID) 600 MG tablet Take 1 tablet 2 x /day with meals for Cholesterol  . L-GLUTAMINE PO Take 1 capsule by mouth daily.  Marland Kitchen l-methylfolate-B6-B12 (METANX) 3-35-2 MG TABS tablet Take 1 tablet by mouth daily.  Marland Kitchen levothyroxine (SYNTHROID) 125 MCG tablet Take 1 tablet (125 mcg total) by mouth daily.  Marland Kitchen liothyronine (CYTOMEL) 5 MCG tablet Take 1 tablet daily on an empty stomach with only water for 30 minutes & no Antacid meds, Calcium or Magnesium for 4 hours & avoid  Biotin  . loratadine (CLARITIN) 10 MG tablet Take 10 mg by mouth daily.  . Magnesium Oxide 500 MG TABS Take 500 mg by mouth daily.  . Menaquinone-7 (VITAMIN K2) 100 MCG CAPS Take 1 capsule by mouth daily.  . metFORMIN (GLUCOPHAGE-XR) 500 MG 24 hr tablet Take 2 tablets 2 x/ day for Diabetes (Patient taking differently: Take 1 tablet by mouth after dinner)  . methocarbamol (ROBAXIN) 750 MG tablet Take 750 mg by mouth 4 (four) times daily.  . Omega-3 Fatty Acids (FISH OIL) 1200 MG CAPS Take 2 capsules by mouth daily.   Vladimir Faster Glycol-Propyl Glycol (SYSTANE) 0.4-0.3 % SOLN Place 1 drop into both eyes 4 (four) times daily.   . RESTASIS 0.05 % ophthalmic emulsion Place 1 drop into both eyes daily.   . TURMERIC CURCUMIN PO Take 900 mg elemental calcium/kg/hr by mouth 2 (two) times daily.  Marland Kitchen ezetimibe (ZETIA) 10 MG tablet TAKE 1 TABLET BY MOUTH DAILY FOR CHOLESTEROL (Patient not taking: Reported on 12/18/2018)  . MILK THISTLE EXTRACT PO  Take 2 capsules by mouth daily.   No current facility-administered medications on file prior to visit.      Allergies:  Allergies  Allergen Reactions  . Ciprofloxacin Hives and Swelling    SWELLING REACTION UNSPECIFIED   . Levaquin [Levofloxacin] Hives  . Lyrica [Pregabalin] Other (See Comments)    Altered mental status ALTERED MENTAL STATUS HALLUCINATIONS  . Compazine [Prochlorperazine Edisylate]     UNSPECIFIED REACTION   . Hydrocodone Itching  . Morphine And Related Itching  . Penicillins Itching  . Sulfa Antibiotics Itching     Medical History:  Past Medical History:  Diagnosis Date  . Arthritis    OA, RA  . Asthma    with a cold  . Autoimmune deficiency syndrome ([place])   . Back pain   . Cancer ([place])    thyroid-1995  . Family history of adverse reaction to anesthesia    mother problems with n/v  . Fibromyalgia   . GERD (gastroesophageal reflux disease)   . Hepatitis    'A" when pt. was 12 yrs. old  . Hiatal hernia   . Hyperlipidemia    . Hypertension   . Hypothyroidism   . Pneumonia    Family history- Reviewed and unchanged Social history- Reviewed and unchanged   Review of Systems:  Review of Systems  Constitutional: Negative for malaise/fatigue and weight loss.  HENT: Negative for hearing loss and tinnitus.   Eyes: Negative for blurred vision and double vision.  Respiratory: Negative for cough, shortness of breath and wheezing.   Cardiovascular: Negative for chest pain, palpitations, orthopnea, claudication and leg swelling.  Gastrointestinal: Negative for abdominal pain, blood in stool, constipation, diarrhea, heartburn, melena, nausea and vomiting.  Genitourinary: Negative.   Musculoskeletal: Positive for back pain and joint pain. Negative for myalgias.  Skin: Negative for rash.  Neurological: Negative for dizziness, tingling, sensory change, weakness and headaches.  Endo/Heme/Allergies: Negative for polydipsia.  Psychiatric/Behavioral: Negative.   All other systems reviewed and are negative.    Physical Exam: BP 110/68   Pulse 84   Temp (!) 97 F (36.1 C)   Ht 5' 2.5" (1.588 m)   Wt 199 lb 9.6 oz (90.5 kg)   LMP  (LMP Unknown)   SpO2 97%   BMI 35.93 kg/m  Wt Readings from Last 3 Encounters:  12/18/18 199 lb 9.6 oz (90.5 kg)  09/15/18 204 lb 3.2 oz (92.6 kg)  06/20/18 199 lb (90.3 kg)   General Appearance: Well nourished, in no apparent distress. Eyes: PERRLA, EOMs, conjunctiva no swelling or erythema Sinuses: No Frontal/maxillary tenderness ENT/Mouth: Ext aud canals clear, TMs without erythema, bulging. No erythema, swelling, or exudate on post pharynx.  Tonsils not swollen or erythematous. Hearing normal.  Neck: Supple, thyroid normal.  Respiratory: Respiratory effort normal, BS equal bilaterally without rales, rhonchi, wheezing or stridor.  Cardio: RRR with no MRGs. Brisk peripheral pulses without edema.  Abdomen: Soft, + BS.  Non tender, no guarding, rebound, hernias, masses. Lymphatics:  Non tender without lymphadenopathy.  Musculoskeletal: Full ROM, 5/5 strength, Mildly antalgic gait. L wrist with some warmth; non-erythematous, no obvious deformity Skin: Warm, dry; intact without rashes, lesions, ecchynosis Neuro: Cranial nerves intact. No cerebellar symptoms.  Psych: Awake and oriented X 3, normal affect, Insight and Judgment appropriate.    Izora Ribas, NP 3:14 PM [facility]

## 2018-12-18 ENCOUNTER — Ambulatory Visit (INDEPENDENT_AMBULATORY_CARE_PROVIDER_SITE_OTHER): Payer: Medicare Other | Admitting: Adult Health

## 2018-12-18 ENCOUNTER — Other Ambulatory Visit: Payer: Self-pay

## 2018-12-18 ENCOUNTER — Encounter: Payer: Self-pay | Admitting: Adult Health

## 2018-12-18 VITALS — BP 110/68 | HR 84 | Temp 97.0°F | Ht 62.5 in | Wt 199.6 lb

## 2018-12-18 DIAGNOSIS — I10 Essential (primary) hypertension: Secondary | ICD-10-CM | POA: Diagnosis not present

## 2018-12-18 DIAGNOSIS — Z79899 Other long term (current) drug therapy: Secondary | ICD-10-CM

## 2018-12-18 DIAGNOSIS — E8881 Metabolic syndrome: Secondary | ICD-10-CM

## 2018-12-18 DIAGNOSIS — E890 Postprocedural hypothyroidism: Secondary | ICD-10-CM | POA: Diagnosis not present

## 2018-12-18 DIAGNOSIS — M79606 Pain in leg, unspecified: Secondary | ICD-10-CM | POA: Diagnosis not present

## 2018-12-18 DIAGNOSIS — R7309 Other abnormal glucose: Secondary | ICD-10-CM | POA: Diagnosis not present

## 2018-12-18 DIAGNOSIS — M47817 Spondylosis without myelopathy or radiculopathy, lumbosacral region: Secondary | ICD-10-CM | POA: Diagnosis not present

## 2018-12-18 DIAGNOSIS — J45909 Unspecified asthma, uncomplicated: Secondary | ICD-10-CM

## 2018-12-18 DIAGNOSIS — E559 Vitamin D deficiency, unspecified: Secondary | ICD-10-CM

## 2018-12-18 DIAGNOSIS — J301 Allergic rhinitis due to pollen: Secondary | ICD-10-CM

## 2018-12-18 DIAGNOSIS — E782 Mixed hyperlipidemia: Secondary | ICD-10-CM | POA: Diagnosis not present

## 2018-12-18 DIAGNOSIS — G894 Chronic pain syndrome: Secondary | ICD-10-CM | POA: Diagnosis not present

## 2018-12-18 DIAGNOSIS — M545 Low back pain: Secondary | ICD-10-CM | POA: Diagnosis not present

## 2018-12-18 MED ORDER — ROSUVASTATIN CALCIUM 40 MG PO TABS: 40.0000 mg | ORAL_TABLET | Freq: Every day | ORAL | 1 refills | Status: DC

## 2018-12-18 MED ORDER — AVENOVA 0.01 % EX SOLN: 1.0000 "application " | Freq: Two times a day (BID) | CUTANEOUS | 1 refills | Status: AC

## 2018-12-18 MED ORDER — ALBUTEROL SULFATE (2.5 MG/3ML) 0.083% IN NEBU: 2.5000 mg | INHALATION_SOLUTION | Freq: Four times a day (QID) | RESPIRATORY_TRACT | 1 refills | Status: AC | PRN

## 2018-12-18 MED ORDER — ALBUTEROL SULFATE HFA 108 (90 BASE) MCG/ACT IN AERS: 1.0000 | INHALATION_SPRAY | Freq: Four times a day (QID) | RESPIRATORY_TRACT | 3 refills | Status: DC | PRN

## 2018-12-18 MED ORDER — LOTEMAX 0.5 % OP GEL: 1.0000 [drp] | Freq: Three times a day (TID) | OPHTHALMIC | 2 refills | Status: DC

## 2018-12-18 MED ORDER — MONTELUKAST SODIUM 10 MG PO TABS: 10.0000 mg | ORAL_TABLET | Freq: Every day | ORAL | 2 refills | Status: DC

## 2018-12-18 MED ORDER — METFORMIN HCL ER 500 MG PO TB24: ORAL_TABLET | ORAL | 1 refills | Status: DC

## 2018-12-18 NOTE — Patient Instructions (Addendum)
Goals    . Exercise 150 min/wk Moderate Activity    . LDL CALC < 130    . Weight (lb) < 185 lb (83.9 kg)       STOP all cholesterol medications - start rosuvastatin 40 mg daily in the evening instead  Try switching from claritin to allegra (fexofenadine) 180 mg daily   Try adding singulair 10 mg daily in the evening (helps with asthma AND allergies)  If no improvement in 1 week, try BREO 1 puff daily inhaler (rinse mouth after)    Montelukast oral tablets What is this medicine? MONTELUKAST (mon te LOO kast) is used to prevent and treat the symptoms of asthma. It is also used to treat allergies. Do not use for an acute asthma attack. This medicine may be used for other purposes; ask your health care provider or pharmacist if you have questions. COMMON BRAND NAME(S): Singulair What should I tell my health care provider before I take this medicine? They need to know if you have any of these conditions: -liver disease -an unusual or allergic reaction to montelukast, other medicines, foods, dyes, or preservatives -pregnant or trying to get pregnant -breast-feeding How should I use this medicine? This medicine should be given by mouth. Follow the directions on the prescription label. Take this medicine at the same time every day. You may take this medicine with or without meals. Do not chew the tablets. Do not stop taking your medicine unless your doctor tells you to. Talk to your pediatrician regarding the use of this medicine in children. Special care may be needed. While this drug may be prescribed for children as young as 55 years of age for selected conditions, precautions do apply. Overdosage: If you think you have taken too much of this medicine contact a poison control center or emergency room at once. NOTE: This medicine is only for you. Do not share this medicine with others. What if I miss a dose? If you miss a dose, take it as soon as you can. If it is almost time for your  next dose, take only that dose. Do not take double or extra doses. What may interact with this medicine? -anti-infectives like rifampin and rifabutin -medicines for seizures like phenytoin, phenobarbital, and carbamazepine This list may not describe all possible interactions. Give your health care provider a list of all the medicines, herbs, non-prescription drugs, or dietary supplements you use. Also tell them if you smoke, drink alcohol, or use illegal drugs. Some items may interact with your medicine. What should I watch for while using this medicine? Visit your doctor or health care professional for regular checks on your progress. Tell your doctor or health care professional if your allergy or asthma symptoms do not improve. Take your medicine even when you do not have symptoms. Do not stop taking any of your medicine(s) unless your doctor tells you to. If you have asthma, talk to your doctor about what to do in an acute asthma attack. Always have your inhaled rescue medicine for asthma attacks with you. Patients and their families should watch for new or worsening thoughts of suicide or depression. Also watch for sudden changes in feelings such as feeling anxious, agitated, panicky, irritable, hostile, aggressive, impulsive, severely restless, overly excited and hyperactive, or not being able to sleep. Any worsening of mood or thoughts of suicide or dying should be reported to your health care professional right away. What side effects may I notice from receiving this medicine? Side effects  that you should report to your doctor or health care professional as soon as possible: -allergic reactions like skin rash or hives, or swelling of the face, lips, or tongue -breathing problems -changes in emotions or moods -confusion -depressed mood -fever or infection -hallucinations -joint pain -painful lumps under the skin -pain, tingling, numbness in the hands or feet -redness, blistering, peeling,  or loosening of the skin, including inside the mouth -restlessness -seizures -sleep walking -signs and symptoms of infection like fever; chills; cough; sore throat; flu-like illness -signs and symptoms of liver injury like dark yellow or brown urine; general ill feeling or flu-like symptoms; light-colored stools; loss of appetite; nausea; right upper belly pain; unusually weak or tired; yellowing of the eyes or skin -sinus pain or swelling -stuttering -suicidal thoughts or other mood changes -tremors -trouble sleeping -uncontrolled muscle movements -unusual bleeding or bruising -vivid or bad dreams Side effects that usually do not require medical attention (report to your doctor or health care professional if they continue or are bothersome): -dizziness -drowsiness -headache -runny nose -stomach upset -tiredness This list may not describe all possible side effects. Call your doctor for medical advice about side effects. You may report side effects to FDA at 1-800-FDA-1088. Where should I keep my medicine? Keep out of the reach of children. Store at room temperature between 15 and 30 degrees C (59 and 86 degrees F). Protect from light and moisture. Keep this medicine in the original bottle. Throw away any unused medicine after the expiration date. NOTE: This sheet is a summary. It may not cover all possible information. If you have questions about this medicine, talk to your doctor, pharmacist, or health care provider.  2019 [employer] (2018-04-18 13:51:04)    Fluticasone; Vilanterol inhalation powder What is this medicine? FLUTICASONE; VILANTEROL (floo TIK a sone; vye LAN ter ol) inhalation is a combination of two medicines that decrease inflammation and help to open up the airways of your lungs. It is for chronic obstructive pulmonary disease (COPD), including chronic bronchitis or emphysema. It is also used for asthma in adults to help control symptoms. Do NOT use for  an acute asthma attack or COPD attack. This medicine may be used for other purposes; ask your health care provider or pharmacist if you have questions. COMMON BRAND NAME(S): BREO ELLIPTA What should I tell my health care provider before I take this medicine? They need to know if you have any of these conditions: -bone problems -diabetes -eye disease, vision problems -immune system problems -heart disease or irregular heartbeat -high blood pressure -infection -pheochromocytoma -seizures -thyroid disease -an unusual or allergic reaction to fluticasone, vilanterol, milk proteins, corticosteroids, other medicines, foods, dyes, or preservatives -pregnant or trying to get pregnant -breast-feeding How should I use this medicine? This medicine is inhaled through the mouth. It is used once per day. Follow the directions on the prescription label. Do not use a spacer device with this inhaler. Take your medicine at regular intervals. Do not take your medicine more often than directed. Do not stop taking except on your doctor's advice. Make sure that you are using your inhaler correctly. Ask you doctor or health care provider if you have any questions. A special MedGuide will be given to you by the pharmacist with each prescription and refill. Be sure to read this information carefully each time. Talk to your pediatrician regarding the use of this medicine in children. Special care may be needed. This medicine is not approved for use in children under 18  years of age. Overdosage: If you think you have taken too much of this medicine contact a poison control center or emergency room at once. NOTE: This medicine is only for you. Do not share this medicine with others. What if I miss a dose? If you miss a dose, use it as soon as you can. If it is almost time for your next dose, use only that dose and continue with your regular schedule. Do not use double or extra doses. What may interact with this  medicine? Do not take this medicine with any of the following medications: -cisapride -dofetilide -dronedarone -MAOIs like Carbex, Eldepryl, Marplan, Nardil, and Parnate -pimozide -thioridazine -ziprasidone This medicine may also interact with the following medications: -antiviral medicines for HIV or AIDS -beta-blockers like metoprolol and propranolol -certain medicines for depression, anxiety, or psychotic disturbances -certain medicines for fungal infections like ketoconazole, itraconazole, posaconazole, voriconazole -conivaptan -diuretics -medicines for colds -nefazodone -other medicines for breathing problems -other medicines that prolong the QT interval (cause an abnormal heart rhythm) This list may not describe all possible interactions. Give your health care provider a list of all the medicines, herbs, non-prescription drugs, or dietary supplements you use. Also tell them if you smoke, drink alcohol, or use illegal drugs. Some items may interact with your medicine. What should I watch for while using this medicine? Visit your doctor or health care professional for regular checkups. Tell your doctor or health care professional if your symptoms do not get better. Do not use this medicine more than once every 24 hours. NEVER use this medicine for an acute asthma or COPD attack. You should use your short-acting rescue inhalers for this purpose. If your symptoms get worse or if you need your short-acting inhalers more often, call your doctor right away. If you are going to have surgery tell your doctor or health care professional that you are using this medicine. Try not to come in contact with people with the chicken pox or measles. If you do, call your doctor. What side effects may I notice from receiving this medicine? Side effects that you should report to your doctor or health care professional as soon as possible: -allergic reactions like skin rash or hives, swelling of the face,  lips, or tongue -breathing problems right after inhaling your medicine -changes in vision -chest pain -fast, irregular heartbeat -feeling faint or lightheaded, falls -fever or chills -nausea, vomiting -tiredness Side effects that usually do not require medical attention (report to your doctor or health care professional if they continue or are bothersome): -cough -headache -nervousness -sore throat -tremor This list may not describe all possible side effects. Call your doctor for medical advice about side effects. You may report side effects to FDA at 1-800-FDA-1088. Where should I keep my medicine? Keep out of the reach of children. Store at room temperature between 15 and 30 degrees C (59 and 86 degrees F). Store in a dry place away from direct heat or sunlight. Throw away 6 weeks after you remove the inhaler from the foil tray, or after the dose indicator reads 0, whichever comes first. Throw away any unopened packages after the expiration date. NOTE: This sheet is a summary. It may not cover all possible information. If you have questions about this medicine, talk to your doctor, pharmacist, or health care provider.  2019 [employer] Standard (2017-09-15 14:04:51)    Rosuvastatin capsules What is this medicine? ROSUVASTATIN (roe SOO va sta tin) is known as an HMG-CoA reductase inhibitor or 'statin'.  It lowers cholesterol and triglycerides in the blood. Diet and lifestyle changes are often used with this drug. This medicine may be used for other purposes; ask your health care provider or pharmacist if you have questions. COMMON BRAND NAME(S): Ezallor What should I tell my health care provider before I take this medicine? They need to know if you have any of these conditions: -diabetes -if you often drink alcohol -history of stroke -kidney disease -liver disease -muscle aches or weakness -thyroid disease -an unusual or allergic reaction to rosuvastatin, other medicines,  foods, dyes, or preservatives -pregnant or trying to get pregnant -breast-feeding How should I use this medicine? Take this medicine by mouth with a glass of water. Follow the directions on the prescription label. Swallow the capsules whole. Do not crush or chew this medicine. You may open the capsule and put the contents in 1 teaspoon of applesauce. Swallow the medicine and applesauce right away. Do not chew the medicine or applesauce. You can take this medicine with or without food. Take your doses at regular intervals. Do not take your medicine more often than directed. Talk to your pediatrician about the use of this medicine in children. Special care may be needed. Overdosage: If you think you have taken too much of this medicine contact a poison control center or emergency room at once. NOTE: This medicine is only for you. Do not share this medicine with others. What if I miss a dose? If you miss a dose, take it as soon as you can. If your next dose is to be taken in less than 12 hours, then do not take the missed dose. Take the next dose at your regular time. Do not take double or extra doses. What may interact with this medicine? Do not take this medicine with any of the following medications: -herbal medicines like red yeast rice This medicine may also interact with the following medications: -alcohol -antacids containing aluminum hydroxide or magnesium hydroxide -cyclosporine -other medicines for high cholesterol -some medicines for HIV infection -warfarin This list may not describe all possible interactions. Give your health care provider a list of all the medicines, herbs, non-prescription drugs, or dietary supplements you use. Also tell them if you smoke, drink alcohol, or use illegal drugs. Some items may interact with your medicine. What should I watch for while using this medicine? Visit your doctor or health care professional for regular check-ups. You may need regular tests to  make sure your liver is working properly. Your health care professional may tell you to stop taking this medicine if you develop muscle problems. If your muscle problems do not go away after stopping this medicine, contact your health care professional. Do not become pregnant while taking this medicine. Women should inform their health care professional if they wish to become pregnant or think they might be pregnant. There is a potential for serious side effects to an unborn child. Talk to your health care professional or pharmacist for more information. Do not breast-feed an infant while taking this medicine. This medicine may affect blood sugar levels. If you have diabetes, check with your doctor or health care professional before you change your diet or the dose of your diabetic medicine. If you are going to need surgery or other procedure, tell your doctor that you are using this medicine. This drug is only part of a total heart-health program. Your doctor or a dietician can suggest a low-cholesterol and low-fat diet to help. Avoid alcohol and smoking, and  keep a proper exercise schedule. This medicine may cause a decrease in Co-Enzyme Q-10. You should make sure that you get enough Co-Enzyme Q-10 while you are taking this medicine. Discuss the foods you eat and the vitamins you take with your health care professional. What side effects may I notice from receiving this medicine? Side effects that you should report to your doctor or health care professional as soon as possible: -allergic reactions like skin rash, itching or hives, swelling of the face, lips, or tongue -dark urine -fever -joint pain -muscle cramps, pain -redness, blistering, peeling or loosening of the skin, including inside the mouth -trouble passing urine or change in the amount of urine -unusually weak or tired -yellowing of the eyes or skin Side effects that usually do not require medical attention (report these to your doctor  or health care professional if they continue or are bothersome): -constipation -heartburn -nausea -stomach gas, pain, upset This list may not describe all possible side effects. Call your doctor for medical advice about side effects. You may report side effects to FDA at 1-800-FDA-1088. Where should I keep my medicine? Keep out of the reach of children. Store at room temperature between 20 and 25 degrees C (68 and 77 degrees F). Keep container tightly closed (protect from moisture). Throw away any unused medicine after the expiration date. NOTE: This sheet is a summary. It may not cover all possible information. If you have questions about this medicine, talk to your doctor, pharmacist, or health care provider.  2019 [employer] Standard (2017-12-29 14:59:10)

## 2018-12-19 LAB — CBC WITH DIFFERENTIAL/PLATELET
Absolute Monocytes: 360 cells/uL (ref 200–950)
Basophils Absolute: 18 cells/uL (ref 0–200)
Basophils Relative: 0.3 %
Eosinophils Absolute: 18 cells/uL (ref 15–500)
Eosinophils Relative: 0.3 %
HCT: 41.2 % (ref 35.0–45.0)
Hemoglobin: 14 g/dL (ref 11.7–15.5)
Lymphs Abs: 3408 cells/uL (ref 850–3900)
MCH: 28.9 pg (ref 27.0–33.0)
MCHC: 34 g/dL (ref 32.0–36.0)
MCV: 85.1 fL (ref 80.0–100.0)
MPV: 9.7 fL (ref 7.5–12.5)
Monocytes Relative: 6 %
Neutro Abs: 2196 cells/uL (ref 1500–7800)
Neutrophils Relative %: 36.6 %
Platelets: 337 10*3/uL (ref 140–400)
RBC: 4.84 10*6/uL (ref 3.80–5.10)
RDW: 13 % (ref 11.0–15.0)
Total Lymphocyte: 56.8 %
WBC: 6 10*3/uL (ref 3.8–10.8)

## 2018-12-19 LAB — COMPLETE METABOLIC PANEL WITH GFR
AG Ratio: 1.9 (calc) (ref 1.0–2.5)
ALT: 23 U/L (ref 6–29)
AST: 20 U/L (ref 10–35)
Albumin: 4.5 g/dL (ref 3.6–5.1)
Alkaline phosphatase (APISO): 66 U/L (ref 37–153)
BUN: 11 mg/dL (ref 7–25)
CO2: 30 mmol/L (ref 20–32)
Calcium: 9.8 mg/dL (ref 8.6–10.4)
Chloride: 100 mmol/L (ref 98–110)
Creat: 0.67 mg/dL (ref 0.50–1.05)
GFR, Est African American: 113 mL/min/{1.73_m2} (ref >=60)
GFR, Est Non African American: 98 mL/min/{1.73_m2} (ref >=60)
Globulin: 2.4 g/dL (calc) (ref 1.9–3.7)
Glucose, Bld: 91 mg/dL (ref 65–99)
Potassium: 4.9 mmol/L (ref 3.5–5.3)
Sodium: 136 mmol/L (ref 135–146)
Total Bilirubin: 0.3 mg/dL (ref 0.2–1.2)
Total Protein: 6.9 g/dL (ref 6.1–8.1)

## 2018-12-19 LAB — LIPID PANEL
Cholesterol: 236 mg/dL — ABNORMAL HIGH (ref <200)
HDL: 65 mg/dL (ref >=50)
LDL Cholesterol (Calc): 147 mg/dL (calc) — ABNORMAL HIGH
Non-HDL Cholesterol (Calc): 171 mg/dL (calc) — ABNORMAL HIGH (ref <130)
Total CHOL/HDL Ratio: 3.6 (calc) (ref <5.0)
Triglycerides: 121 mg/dL (ref <150)

## 2018-12-19 LAB — TSH: TSH: 0.73 mIU/L (ref 0.40–4.50)

## 2018-12-19 LAB — HEMOGLOBIN A1C
Hgb A1c MFr Bld: 5.5 % of total Hgb (ref <5.7)
Mean Plasma Glucose: 111 (calc)
eAG (mmol/L): 6.2 (calc)

## 2018-12-19 LAB — MAGNESIUM: Magnesium: 1.9 mg/dL (ref 1.5–2.5)

## 2018-12-22 ENCOUNTER — Ambulatory Visit: Payer: Self-pay | Admitting: Adult Health

## 2018-12-26 NOTE — Progress Notes (Signed)
Virtual Visit via Telephone Note  I connected with Autumn Frye on 12/27/18 at 12:00 PM EDT by telephone and verified that I am speaking with the correct person using two identifiers.   I discussed the limitations, risks, security and privacy concerns of performing an evaluation and management service by telephone and the availability of in person appointments. I also discussed with the patient that there may be a patient responsible charge related to this service. The patient expressed understanding and agreed to proceed.    I discussed the assessment and treatment plan with the patient. The patient was provided an opportunity to ask questions and all were answered. The patient agreed with the plan and demonstrated an understanding of the instructions.   The patient was advised to call back or seek an in-person evaluation if the symptoms worsen or if the condition fails to improve as anticipated.  I provided 28 minutes of non-face-to-face time during this encounter.   Izora Ribas, NP     MEDICARE ANNUAL WELLNESS VISIT AND FOLLOW UP  Assessment:    Diagnoses and all orders for this visit:  Encounter for Medicare annual wellness exam  Essential hypertension Continue medication Monitor blood pressure at home; call if consistently over 130/80 Continue DASH diet.   Reminder to go to the ER if any CP, SOB, nausea, dizziness, severe HA, changes vision/speech, left arm numbness and tingling and jaw pain.  Intrinsic asthma Stable on inhalers/meds  Sjogren's syndrome, with unspecified organ involvement ([place]) On DMARD, followed by rheumatology  Postoperative hypothyroidism continue medications the same pending lab results reminded to take on an empty stomach 30-1mins before food.  check TSH level  Cervical spondylosis with radiculopathy Currently fairly managed; followed by Dr. Dossie Der and Dr. Cyndy Freeze  Rheumatoid arthritis involving multiple sites, unspecified rheumatoid  factor presence ([place]) Managed by Dr. Dossie Der, on DMARD  Degeneration of intervertebral disc of lumbosacral region Continue NSAID PRN, refer to ortho PRN  Vitamin D deficiency At goal at recent check; continue to recommend supplementation for goal of 70-100 Defer vitamin D level  Prediabetes Controlled on metformin for hx of severe fasting glucose elevation Discussed disease and risks Discussed diet/exercise, weight management  A1C  Morbid obesity ([place]) Long discussion about weight loss, diet, and exercise Recommended diet heavy in fruits and veggies and low in animal meats, cheeses, and dairy products, appropriate calorie intake Discussed appropriate weight for height and initial goal (185lb) Follow up at next visit  Mixed hyperlipidemia Continue medications; doing well with new rosuvastatin 40 mg daily  Continue low cholesterol diet and exercise.  Check lipid panel.   Medication management CBC, CMP/GFR, magnesium at routine visits    Over 40 minutes of exam, counseling, chart review and critical decision making was performed Future Appointments  Date Time Provider [place]  04/04/2019 11:00 AM Unk Pinto, MD [facility] None     Plan:   During the course of the visit the patient was educated and counseled about appropriate screening and preventive services including:    Pneumococcal vaccine   Prevnar 13  Influenza vaccine  Td vaccine  Screening electrocardiogram  Bone densitometry screening  Colorectal cancer screening  Diabetes screening  Glaucoma screening  Nutrition counseling   Advanced directives: requested   Subjective:  Autumn Frye is a 58 y.o. female who presents for Medicare Annual Wellness Visit.    She was recently started on rosuvastatin 40 mg, taking daily without myalgias.   She fractured L foot since last visit after  hitting on shovel, saw ortho and is in boot for 6 weeks. She is managing pain well.    --------------------------------------------------------------------  She just had 3 month follow up. Patient is followed by Dr Dossie Der  for Rheumatoid Arthritis since 2009.   BMI is Body mass index is 36.36 kg/m., she has been working on diet and exercise, going to the GYM 3 days a week, would like to get down to 140 lb or so, cutting out starch and sugar.  Wt Readings from Last 3 Encounters:  12/27/18 202 lb (91.6 kg)  12/27/18 199 lb 9.6 oz (90.5 kg)  12/18/18 199 lb 9.6 oz (90.5 kg)   Her blood pressure has been controlled at home, today their BP is   She does workout. She denies chest pain, shortness of breath, dizziness.   She is on cholesterol medication (rosuvastatin 40 mg daily since last visit) and denies myalgias. Her LDL cholesterol is not at goal. The cholesterol last visit was:   Lab Results  Component Value Date   CHOL 236 (H) 12/18/2018   HDL 65 12/18/2018   LDLCALC 147 (H) 12/18/2018   TRIG 121 12/18/2018   CHOLHDL 3.6 12/18/2018    She has been working on diet and exercise for prediabetes controlled on metformin 500 mg once daily (initiated by Dr. Chalmers Cater for hx of fasting glucose 300+), and denies foot ulcerations, increased appetite, nausea, paresthesia of the feet, polydipsia, polyuria and visual disturbances. Last A1C in the office was:  Lab Results  Component Value Date   HGBA1C 5.5 12/18/2018   She is on thyroid medication. Her medication was not changed last visit.   Lab Results  Component Value Date   TSH 0.73 12/18/2018   Last GFR: Lab Results  Component Value Date   GFRNONAA 98 12/18/2018   Patient is on Vitamin D supplement.   Lab Results  Component Value Date   VD25OH 65 09/15/2018      Medication Review: Current Outpatient Medications on File Prior to Visit  Medication Sig Dispense Refill  . Abatacept 125 MG/ML SOSY Inject 1 Syringe into the skin every 7 (seven) days.    Marland Kitchen acetaminophen (TYLENOL) 500 MG tablet Take 1 tablet (500 mg total)  by mouth every 6 (six) hours as needed. 30 tablet 0  . Acetylcysteine (N-ACETYL-L-CYSTEINE PO) Take 1 capsule by mouth daily.    Marland Kitchen albuterol (PROAIR HFA) 108 (90 Base) MCG/ACT inhaler Inhale 1-2 puffs into the lungs every 6 (six) hours as needed for wheezing or shortness of breath. Reported on 03/12/2016 18 g 3  . albuterol (PROVENTIL) (2.5 MG/3ML) 0.083% nebulizer solution Take 3 mLs (2.5 mg total) by nebulization every 6 (six) hours as needed for wheezing or shortness of breath. 60 vial 1  . amitriptyline (ELAVIL) 50 MG tablet Take 50 mg by mouth at bedtime.     . ASPIRIN 81 PO Take by mouth daily.    . buprenorphine (BUTRANS) 10 MCG/HR PTWK patch Place 15 mcg onto the skin once a week.     . Cholecalciferol (VITAMIN D3) 5000 UNITS TABS Take 10,000 Units by mouth daily.     Marland Kitchen CRANBERRY PO Take by mouth daily.    . Eyelid Cleansers (AVENOVA) 0.01 % SOLN Place 1 application into both eyes 2 (two) times daily. Apply to eyelids BID 1 Bottle 1  . FEMRING 0.1 MG/24HR RING INSERT 1 RING VAGINALLY EVERY 3 MONTHS 1 each 3  . fexofenadine (ALLEGRA) 180 MG tablet Take 180 mg by mouth daily.    Marland Kitchen  furosemide (LASIX) 40 MG tablet TAKE 1 TABLET BY MOUTH THREE TIMES DAILY FOR FLUID RETENTION (Patient taking differently: TAKE 1 TABLET BY MOUTH daily      takes as needed) 270 tablet 1  . L-GLUTAMINE PO Take 1 capsule by mouth daily.    Marland Kitchen l-methylfolate-B6-B12 (METANX) 3-35-2 MG TABS tablet Take 1 tablet by mouth daily.    . Levomefolate Glucosamine (METHYLFOLATE PO) Take 5,000 mg by mouth daily.    Marland Kitchen levothyroxine (SYNTHROID) 125 MCG tablet Take 1 tablet (125 mcg total) by mouth daily. 30 tablet 11  . liothyronine (CYTOMEL) 5 MCG tablet Take 1 tablet daily on an empty stomach with only water for 30 minutes & no Antacid meds, Calcium or Magnesium for 4 hours & avoid Biotin 90 tablet 0  . LOTEMAX 0.5 % GEL Place 1 drop into both eyes 3 (three) times daily. 1 Bottle 2  . Magnesium Oxide 500 MG TABS Take 500 mg by  mouth daily.    . Menaquinone-7 (VITAMIN K2) 100 MCG CAPS Take 1 capsule by mouth daily.    . metFORMIN (GLUCOPHAGE-XR) 500 MG 24 hr tablet Take 1 tablet by mouth after dinner 90 tablet 1  . methocarbamol (ROBAXIN) 750 MG tablet Take 750 mg by mouth 4 (four) times daily.    Marland Kitchen MILK THISTLE EXTRACT PO Take 2 capsules by mouth daily.    . montelukast (SINGULAIR) 10 MG tablet Take 1 tablet (10 mg total) by mouth at bedtime. 30 tablet 2  . Omega-3 Fatty Acids (FISH OIL) 1200 MG CAPS Take 2 capsules by mouth daily.     Vladimir Faster Glycol-Propyl Glycol (SYSTANE) 0.4-0.3 % SOLN Place 1 drop into both eyes 4 (four) times daily.     . RESTASIS 0.05 % ophthalmic emulsion Place 1 drop into both eyes daily.   3  . rosuvastatin (CRESTOR) 40 MG tablet Take 1 tablet (40 mg total) by mouth daily. 90 tablet 1  . TURMERIC CURCUMIN PO Take 900 mg elemental calcium/kg/hr by mouth 2 (two) times daily.     No current facility-administered medications on file prior to visit.     Allergies  Allergen Reactions  . Ciprofloxacin Hives and Swelling    SWELLING REACTION UNSPECIFIED   . Levaquin [Levofloxacin] Hives  . Lyrica [Pregabalin] Other (See Comments)    Altered mental status ALTERED MENTAL STATUS HALLUCINATIONS  . Compazine [Prochlorperazine Edisylate]     UNSPECIFIED REACTION   . Hydrocodone Itching  . Morphine And Related Itching  . Penicillins Itching  . Sulfa Antibiotics Itching    Current Problems (verified) Patient Active Problem List   Diagnosis Date Noted  . Morbid obesity ([place]) 11/08/2017  . Abnormal glucose 08/02/2017  . Cervical spondylosis with radiculopathy 05/30/2017  . Rheumatoid arthritis involving multiple sites ([place]) 11/14/2015  . Encounter for Medicare annual wellness exam 04/24/2015  . Sjogren's disease ([place]) 07/05/2014  . Medication management 03/26/2014  . Mixed hyperlipidemia 09/24/2013  . Vitamin D deficiency 09/24/2013  . Intrinsic asthma   . Hypertension   .  Hypothyroidism   . Degeneration of intervertebral disc of lumbosacral region 05/23/2008    Screening Tests Immunization History  Administered Date(s) Administered  . DTaP 09/15/2012, 09/15/2012  . Influenza Inj Mdck Quad With Preservative 08/02/2017  . Influenza Whole 09/06/2012  . Influenza-Unspecified 05/07/2014, 05/20/2016  . Pneumococcal Conjugate-13 09/15/2012, 07/05/2014  . Zoster 09/03/2013    Colonoscopy: 2013 - 10 year follow up MGM: 02/09/2018 Stress: 2004 DEXA: 2013 Echo: 2004 CXr: 02/2016 PAP: had  in the last 12 months by GYN - report requested- has scheduled 01/2019 Tdap: 2014 Flu vaccine: 2018  All other vaccines UTD  Names of Other Physician/Practitioners you currently use: 1. [facility] here for primary care 2. [place], eye doctor, last visit Jan 2019 - mild cataracts 3. [facility], dentist, last visit 2019, goes q72m   Patient Care Team: Unk Pinto, MD as PCP - General (Internal Medicine) Ladene Artist, MD as Consulting Physician (Gastroenterology) Newt Minion, MD as Consulting Physician (Orthopedic Surgery) Unice Bailey, MD as Consulting Physician (Rheumatology) Karie Chimera, MD as Consulting Physician (Neurosurgery) Druscilla Brownie, MD as Consulting Physician (Dermatology) Druscilla Brownie, MD as Consulting Physician (Dermatology)  SURGICAL HISTORY She  has a past surgical history that includes Thyroidectomy; Upper gastrointestinal endoscopy; Total knee arthroplasty; Vaginal hysterectomy; Knee surgery; Pilonidal cyst excision; Tonsillectomy; Anterior lat lumbar fusion (Left, 01/26/2013); Lumbar percutaneous pedicle screw 1 level (Left, 01/26/2013); Knee Arthroplasty (Left, 02/16/2016); and Anterior cervical decomp/discectomy fusion (N/A, 05/30/2017). FAMILY HISTORY Her family history includes Asthma in her brother; Breast cancer (age of onset: 20) in her maternal grandmother; Cancer in her  maternal grandmother; Diabetes in her brother; Hypertension in her father; Other in her daughter; Stroke in her father. SOCIAL HISTORY She  reports that she quit smoking about 3 years ago. Her smoking use included cigarettes. She has a 20.00 pack-year smoking history. She has never used smokeless tobacco. She reports current alcohol use. She reports that she does not use drugs.   MEDICARE WELLNESS OBJECTIVES: Physical activity: Current Exercise Habits: The patient does not participate in regular exercise at present(was going to the pool 3 days a week, cannot go since covid 19), Exercise limited by: orthopedic condition(s) Cardiac risk factors: Cardiac Risk Factors include: hypertension;dyslipidemia;obesity (BMI >30kg/m2);sedentary lifestyle;smoking/ tobacco exposure Depression/mood screen:   Depression screen [facility] 2/9 12/27/2018  Decreased Interest 0  Down, Depressed, Hopeless 0  PHQ - 2 Score 0    ADLs:  In your present state of health, do you have any difficulty performing the following activities: 12/27/2018 09/14/2018  Hearing? N N  Vision? N N  Difficulty concentrating or making decisions? N N  Walking or climbing stairs? N N  Dressing or bathing? N N  Doing errands, shopping? N N  Some recent data might be hidden     Cognitive Testing  Alert? Yes  Normal Appearance?Yes  Oriented to person? Yes  Place? Yes   Time? Yes  Recall of three objects?  Yes  Can perform simple calculations? Yes  Displays appropriate judgment?Yes  Can read the correct time from a watch face?Yes  EOL planning: Does Patient Have a Medical Advance Directive?: No Would patient like information on creating a medical advance directive?: Yes (MAU/Ambulatory/Procedural Areas - Information given)  Review of Systems  Constitutional: Negative for malaise/fatigue and weight loss.  HENT: Negative for hearing loss and tinnitus.   Eyes: Negative for blurred vision and double vision.  Respiratory: Negative for cough,  sputum production, shortness of breath and wheezing.   Cardiovascular: Negative for chest pain, palpitations, orthopnea, claudication, leg swelling and PND.  Gastrointestinal: Negative for abdominal pain, blood in stool, constipation, diarrhea, heartburn, melena, nausea and vomiting.  Genitourinary: Negative.   Musculoskeletal: Negative for falls, joint pain and myalgias.  Skin: Negative for rash.  Neurological: Negative for dizziness, tingling, sensory change, weakness and headaches.  Endo/Heme/Allergies: Negative for polydipsia.  Psychiatric/Behavioral: Negative.  Negative for depression, memory loss, substance abuse and suicidal ideas. The patient  is not nervous/anxious and does not have insomnia.   All other systems reviewed and are negative.    Objective:     Today's Vitals   12/27/18 1148  Temp: 98.8 F (37.1 C)  Weight: 202 lb (91.6 kg)   Body mass index is 36.36 kg/m.  General : Well sounding patient in no apparent distress HEENT: no hoarseness, no cough for duration of visit Lungs: speaks in complete sentences, no audible wheezing, no apparent distress Neurological: alert, oriented x 3 Psychiatric: pleasant, judgement appropriate    Medicare Attestation I have personally reviewed: The patient's medical and social history Their use of alcohol, tobacco or illicit drugs Their current medications and supplements The patient's functional ability including ADLs,fall risks, home safety risks, cognitive, and hearing and visual impairment Diet and physical activities Evidence for depression or mood disorders  The patient's weight, height, BMI, and visual acuity have been recorded in the chart.  I have made referrals, counseling, and provided education to the patient based on review of the above and I have provided the patient with a written personalized care plan for preventive services.     Izora Ribas, NP   12/27/2018

## 2018-12-27 ENCOUNTER — Ambulatory Visit (INDEPENDENT_AMBULATORY_CARE_PROVIDER_SITE_OTHER): Payer: Self-pay

## 2018-12-27 ENCOUNTER — Encounter: Payer: Self-pay | Admitting: Adult Health

## 2018-12-27 ENCOUNTER — Encounter (INDEPENDENT_AMBULATORY_CARE_PROVIDER_SITE_OTHER): Payer: Self-pay | Admitting: Family

## 2018-12-27 ENCOUNTER — Ambulatory Visit: Payer: Medicare Other | Admitting: Adult Health

## 2018-12-27 ENCOUNTER — Ambulatory Visit (INDEPENDENT_AMBULATORY_CARE_PROVIDER_SITE_OTHER): Payer: Medicare Other | Admitting: Family

## 2018-12-27 ENCOUNTER — Other Ambulatory Visit: Payer: Self-pay

## 2018-12-27 VITALS — Temp 98.8°F | Wt 202.0 lb

## 2018-12-27 VITALS — Ht 62.5 in | Wt 199.6 lb

## 2018-12-27 DIAGNOSIS — Z0001 Encounter for general adult medical examination with abnormal findings: Secondary | ICD-10-CM | POA: Diagnosis not present

## 2018-12-27 DIAGNOSIS — I10 Essential (primary) hypertension: Secondary | ICD-10-CM

## 2018-12-27 DIAGNOSIS — M3500 Sicca syndrome, unspecified: Secondary | ICD-10-CM

## 2018-12-27 DIAGNOSIS — M5137 Other intervertebral disc degeneration, lumbosacral region: Secondary | ICD-10-CM

## 2018-12-27 DIAGNOSIS — M4722 Other spondylosis with radiculopathy, cervical region: Secondary | ICD-10-CM

## 2018-12-27 DIAGNOSIS — M51379 Other intervertebral disc degeneration, lumbosacral region without mention of lumbar back pain or lower extremity pain: Secondary | ICD-10-CM

## 2018-12-27 DIAGNOSIS — M79672 Pain in left foot: Secondary | ICD-10-CM

## 2018-12-27 DIAGNOSIS — M25522 Pain in left elbow: Secondary | ICD-10-CM | POA: Diagnosis not present

## 2018-12-27 DIAGNOSIS — R7309 Other abnormal glucose: Secondary | ICD-10-CM

## 2018-12-27 DIAGNOSIS — Z0000 Encounter for general adult medical examination without abnormal findings: Secondary | ICD-10-CM

## 2018-12-27 DIAGNOSIS — R6889 Other general symptoms and signs: Secondary | ICD-10-CM | POA: Diagnosis not present

## 2018-12-27 DIAGNOSIS — Z79899 Other long term (current) drug therapy: Secondary | ICD-10-CM | POA: Diagnosis not present

## 2018-12-27 DIAGNOSIS — E559 Vitamin D deficiency, unspecified: Secondary | ICD-10-CM

## 2018-12-27 DIAGNOSIS — J45909 Unspecified asthma, uncomplicated: Secondary | ICD-10-CM

## 2018-12-27 DIAGNOSIS — E782 Mixed hyperlipidemia: Secondary | ICD-10-CM | POA: Diagnosis not present

## 2018-12-27 DIAGNOSIS — E890 Postprocedural hypothyroidism: Secondary | ICD-10-CM

## 2018-12-27 DIAGNOSIS — M069 Rheumatoid arthritis, unspecified: Secondary | ICD-10-CM

## 2018-12-27 MED ORDER — FLUTICASONE FUROATE-VILANTEROL 200-25 MCG/INH IN AEPB: 1.0000 | INHALATION_SPRAY | Freq: Every day | RESPIRATORY_TRACT | 99 refills | Status: DC

## 2019-01-03 ENCOUNTER — Encounter (INDEPENDENT_AMBULATORY_CARE_PROVIDER_SITE_OTHER): Payer: Self-pay | Admitting: Family

## 2019-01-03 NOTE — Progress Notes (Signed)
Office Visit Note   Patient: Autumn Frye           Date of Birth: Jun 17, 1961           MRN: [id number] Visit Date: 12/27/2018              Requested by: Unk Pinto, [place] [address], [place] [address] PCP: Unk Pinto, MD  Chief Complaint  Patient presents with  . Left Foot - Pain      HPI: The patient is a 58 year old woman seen today for evaluation of left foot pain. Has a history of RA. Was working in her garden in crocs and shoveled with foot about a week ago. Had immediate onset of pain to the lateral aspect of her left foot.  She had some easing of the pain of the last week but is still having pain with ambulation difficulty walking.  She is due to her history of RA she was unsure whether this was related to her RA or if she had truly injured herself.  Assessment & Plan: Visit Diagnoses:  1. Left foot pain     Plan: We will treat as fracture to the base of the fifth.  She will follow-up in the office in 2 weeks if having continued pain with repeat radiographs of her foot.  We will place her in a postop shoe today she will minimize her weightbearing if painful no weightbearing at all.  Discussed stiff walking for the next 6 to 8 weeks.  She will call the office with any concerns.  Follow-Up Instructions: No follow-ups on file.   Ortho Exam  Patient is alert, oriented, no adenopathy, well-dressed, normal affect, normal respiratory effort. On examination of the left foot there is no ecchymosis.  She does have some redness and swelling to the lateral aspect of her foot, to the base of her fifth metatarsal.  This is exquisitely tender.  Most tender to the plantar aspect.  Full range of motion.  Imaging: No results found. No images are attached to the encounter.  Labs: Lab Results  Component Value Date   HGBA1C 5.5 12/18/2018   HGBA1C 5.4 09/15/2018   HGBA1C 5.4 06/07/2018   ESRSEDRATE 26 02/05/2016   LABURIC 5.6 11/14/2015   REPTSTATUS 06/19/2017 FINAL 06/16/2017   CULT >=100,000 COLONIES/mL ESCHERICHIA COLI (A) 06/16/2017   LABORGA ESCHERICHIA COLI (A) 06/16/2017     Lab Results  Component Value Date   ALBUMIN 3.8 05/26/2017   ALBUMIN 4.0 01/28/2017   ALBUMIN 4.2 10/14/2016   LABURIC 5.6 11/14/2015    Body mass index is 35.93 kg/m.  Orders:  Orders Placed This Encounter  Procedures  . XR Foot Complete Left   No orders of the defined types were placed in this encounter.    Procedures: No procedures performed  Clinical Data: No additional findings.  ROS:  All other systems negative, except as noted in the HPI. Review of Systems  Constitutional: Negative for chills and fever.  Musculoskeletal: Positive for arthralgias and joint swelling.  Skin: Negative for wound.    Objective: Vital Signs: Ht 5' 2.5" (1.588 m)   Wt 199 lb 9.6 oz (90.5 kg)   LMP  (LMP Unknown)   BMI 35.93 kg/m   Specialty Comments:  No specialty comments available.  PMFS History: Patient Active Problem List   Diagnosis Date Noted  . Morbid obesity ([place]) 11/08/2017  . Abnormal glucose 08/02/2017  . Cervical spondylosis with radiculopathy 05/30/2017  .  Rheumatoid arthritis involving multiple sites ([place]) 11/14/2015  . Encounter for Medicare annual wellness exam 04/24/2015  . Sjogren's disease ([place]) 07/05/2014  . Medication management 03/26/2014  . Mixed hyperlipidemia 09/24/2013  . Vitamin D deficiency 09/24/2013  . Intrinsic asthma   . Hypertension   . Hypothyroidism   . Degeneration of intervertebral disc of lumbosacral region 05/23/2008   Past Medical History:  Diagnosis Date  . Arthritis    OA, RA  . Asthma    with a cold  . Autoimmune deficiency syndrome ([place])   . Back pain   . Cancer ([place])    thyroid-1995  . Family history of adverse reaction to anesthesia    mother problems with n/v  . Fibromyalgia   . GERD (gastroesophageal reflux disease)   . Hepatitis    'A" when pt. was 12 yrs. old   . Hiatal hernia   . Hyperlipidemia   . Hypertension   . Hypothyroidism   . Pneumonia     Family History  Problem Relation Age of Onset  . Hypertension Father   . Stroke Father   . Diabetes Brother   . Asthma Brother   . Other Daughter        XED CONNECTIVE TISSUE  . Cancer Maternal Grandmother        BREAST  . Breast cancer Maternal Grandmother 18  . Colon cancer Neg Hx   . Esophageal cancer Neg Hx   . Stomach cancer Neg Hx   . Rectal cancer Neg Hx     Past Surgical History:  Procedure Laterality Date  . ANTERIOR CERVICAL DECOMP/DISCECTOMY FUSION N/A 05/30/2017   Procedure: Cervical five-six, Cervical six-seven Anterior discectomy with fusion and plate fixation;  Surgeon: Ditty, Kevan Ny, MD;  Location: [facility];  Service: Neurosurgery;  Laterality: N/A;  . ANTERIOR LAT LUMBAR FUSION Left 01/26/2013   Procedure: ANTERIOR LATERAL LUMBAR FUSION 1 LEVEL;  Surgeon: Faythe Ghee, MD;  Location: [facility];  Service: Neurosurgery;  Laterality: Left;  lumbar four-five  . KNEE ARTHROPLASTY Left 02/16/2016   Procedure: COMPUTER ASSISTED TOTAL KNEE ARTHROPLASTY;  Surgeon: Dereck Leep, MD;  Location: [facility];  Service: Orthopedics;  Laterality: Left;  . KNEE SURGERY     3 on right knee, 1 on left-prior to TKR  . LUMBAR PERCUTANEOUS PEDICLE SCREW 1 LEVEL Left 01/26/2013   Procedure: LUMBAR PERCUTANEOUS PEDICLE SCREW 1 LEVEL;  Surgeon: Faythe Ghee, MD;  Location: [facility];  Service: Neurosurgery;  Laterality: Left;  lumbar four-five  . PILONIDAL CYST EXCISION    . THYROIDECTOMY    . TONSILLECTOMY    . TOTAL KNEE ARTHROPLASTY     right  . UPPER GASTROINTESTINAL ENDOSCOPY    . VAGINAL HYSTERECTOMY     Social History   Occupational History  . Not on file  Tobacco Use  . Smoking status: Former Smoker    Packs/day: 0.50    Years: 40.00    Pack years: 20.00    Types: Cigarettes    Last attempt to quit: 07/05/2015    Years since quitting: 3.5  . Smokeless tobacco:  Never Used  Substance and Sexual Activity  . Alcohol use: Yes    Alcohol/week: 0.0 standard drinks    Comment: rare  . Drug use: No  . Sexual activity: Not on file

## 2019-01-18 DIAGNOSIS — M25519 Pain in unspecified shoulder: Secondary | ICD-10-CM | POA: Diagnosis not present

## 2019-01-18 DIAGNOSIS — M069 Rheumatoid arthritis, unspecified: Secondary | ICD-10-CM | POA: Diagnosis not present

## 2019-01-18 DIAGNOSIS — M47816 Spondylosis without myelopathy or radiculopathy, lumbar region: Secondary | ICD-10-CM | POA: Diagnosis not present

## 2019-01-18 DIAGNOSIS — Z79891 Long term (current) use of opiate analgesic: Secondary | ICD-10-CM | POA: Diagnosis not present

## 2019-01-18 DIAGNOSIS — Z79899 Other long term (current) drug therapy: Secondary | ICD-10-CM | POA: Diagnosis not present

## 2019-01-18 DIAGNOSIS — G894 Chronic pain syndrome: Secondary | ICD-10-CM | POA: Diagnosis not present

## 2019-01-23 DIAGNOSIS — H04123 Dry eye syndrome of bilateral lacrimal glands: Secondary | ICD-10-CM | POA: Diagnosis not present

## 2019-02-05 DIAGNOSIS — G894 Chronic pain syndrome: Secondary | ICD-10-CM | POA: Diagnosis not present

## 2019-02-05 DIAGNOSIS — M47817 Spondylosis without myelopathy or radiculopathy, lumbosacral region: Secondary | ICD-10-CM | POA: Diagnosis not present

## 2019-02-05 DIAGNOSIS — M545 Low back pain: Secondary | ICD-10-CM | POA: Diagnosis not present

## 2019-02-05 DIAGNOSIS — M79606 Pain in leg, unspecified: Secondary | ICD-10-CM | POA: Diagnosis not present

## 2019-02-22 DIAGNOSIS — M47816 Spondylosis without myelopathy or radiculopathy, lumbar region: Secondary | ICD-10-CM | POA: Diagnosis not present

## 2019-02-22 DIAGNOSIS — M25559 Pain in unspecified hip: Secondary | ICD-10-CM | POA: Diagnosis not present

## 2019-02-22 DIAGNOSIS — G894 Chronic pain syndrome: Secondary | ICD-10-CM | POA: Diagnosis not present

## 2019-02-22 DIAGNOSIS — M069 Rheumatoid arthritis, unspecified: Secondary | ICD-10-CM | POA: Diagnosis not present

## 2019-03-04 ENCOUNTER — Other Ambulatory Visit: Payer: Self-pay | Admitting: Internal Medicine

## 2019-03-04 DIAGNOSIS — E890 Postprocedural hypothyroidism: Secondary | ICD-10-CM

## 2019-03-04 MED ORDER — LEVOTHYROXINE SODIUM 125 MCG PO TABS: ORAL_TABLET | ORAL | 3 refills | Status: DC

## 2019-03-07 DIAGNOSIS — M549 Dorsalgia, unspecified: Secondary | ICD-10-CM | POA: Diagnosis not present

## 2019-03-07 DIAGNOSIS — M25539 Pain in unspecified wrist: Secondary | ICD-10-CM | POA: Diagnosis not present

## 2019-03-07 DIAGNOSIS — M0609 Rheumatoid arthritis without rheumatoid factor, multiple sites: Secondary | ICD-10-CM | POA: Diagnosis not present

## 2019-03-07 DIAGNOSIS — M797 Fibromyalgia: Secondary | ICD-10-CM | POA: Diagnosis not present

## 2019-03-07 DIAGNOSIS — Z79899 Other long term (current) drug therapy: Secondary | ICD-10-CM | POA: Diagnosis not present

## 2019-03-13 ENCOUNTER — Encounter: Payer: Self-pay | Admitting: Internal Medicine

## 2019-03-17 DIAGNOSIS — G894 Chronic pain syndrome: Secondary | ICD-10-CM | POA: Diagnosis not present

## 2019-03-17 DIAGNOSIS — Z1159 Encounter for screening for other viral diseases: Secondary | ICD-10-CM | POA: Diagnosis not present

## 2019-03-17 DIAGNOSIS — Z01812 Encounter for preprocedural laboratory examination: Secondary | ICD-10-CM | POA: Diagnosis not present

## 2019-03-19 ENCOUNTER — Other Ambulatory Visit: Payer: Self-pay | Admitting: Adult Health

## 2019-03-19 DIAGNOSIS — J45909 Unspecified asthma, uncomplicated: Secondary | ICD-10-CM

## 2019-03-20 DIAGNOSIS — M5134 Other intervertebral disc degeneration, thoracic region: Secondary | ICD-10-CM | POA: Diagnosis not present

## 2019-03-20 DIAGNOSIS — E890 Postprocedural hypothyroidism: Secondary | ICD-10-CM | POA: Diagnosis not present

## 2019-03-20 DIAGNOSIS — J45909 Unspecified asthma, uncomplicated: Secondary | ICD-10-CM | POA: Diagnosis not present

## 2019-03-20 DIAGNOSIS — E785 Hyperlipidemia, unspecified: Secondary | ICD-10-CM | POA: Diagnosis not present

## 2019-03-20 DIAGNOSIS — Z881 Allergy status to other antibiotic agents status: Secondary | ICD-10-CM | POA: Diagnosis not present

## 2019-03-20 DIAGNOSIS — Z96653 Presence of artificial knee joint, bilateral: Secondary | ICD-10-CM | POA: Diagnosis not present

## 2019-03-20 DIAGNOSIS — Z79899 Other long term (current) drug therapy: Secondary | ICD-10-CM | POA: Diagnosis not present

## 2019-03-20 DIAGNOSIS — Z85850 Personal history of malignant neoplasm of thyroid: Secondary | ICD-10-CM | POA: Diagnosis not present

## 2019-03-20 DIAGNOSIS — Z885 Allergy status to narcotic agent status: Secondary | ICD-10-CM | POA: Diagnosis not present

## 2019-03-20 DIAGNOSIS — M47817 Spondylosis without myelopathy or radiculopathy, lumbosacral region: Secondary | ICD-10-CM | POA: Diagnosis not present

## 2019-03-20 DIAGNOSIS — Z880 Allergy status to penicillin: Secondary | ICD-10-CM | POA: Diagnosis not present

## 2019-03-20 DIAGNOSIS — Z981 Arthrodesis status: Secondary | ICD-10-CM | POA: Diagnosis not present

## 2019-03-20 DIAGNOSIS — Z7951 Long term (current) use of inhaled steroids: Secondary | ICD-10-CM | POA: Diagnosis not present

## 2019-03-20 DIAGNOSIS — Z882 Allergy status to sulfonamides status: Secondary | ICD-10-CM | POA: Diagnosis not present

## 2019-03-20 DIAGNOSIS — M961 Postlaminectomy syndrome, not elsewhere classified: Secondary | ICD-10-CM | POA: Diagnosis not present

## 2019-03-20 DIAGNOSIS — G894 Chronic pain syndrome: Secondary | ICD-10-CM | POA: Diagnosis not present

## 2019-03-31 ENCOUNTER — Other Ambulatory Visit: Payer: Self-pay | Admitting: Adult Health

## 2019-03-31 DIAGNOSIS — J45909 Unspecified asthma, uncomplicated: Secondary | ICD-10-CM

## 2019-04-03 ENCOUNTER — Encounter: Payer: Self-pay | Admitting: Internal Medicine

## 2019-04-03 NOTE — Patient Instructions (Signed)
- Vit D  And Vit C 1,000 mg  are recommended to help protect  against the Covid_19 and other Corona viruses.   - Also it's recommended to take Zinc 50 mg to help  protect against the Covid_19  And best place to get  is also on [email or web address] and don't pay more than 6-8 cents /pill !   ================================ Coronavirus (COVID-19) Are you at risk?  Are you at risk for the Coronavirus (COVID-19)?  To be considered HIGH RISK for Coronavirus (COVID-19), you have to meet the following criteria:  . Traveled to [place], [place], [place], [place] or [place]; or in the [place] to [place], [place], [place]  . or [place]; and have fever, cough, and shortness of breath within the last 2 weeks of travel OR . Been in close contact with a person diagnosed with COVID-19 within the last 2 weeks and have  . fever, cough,and shortness of breath .  . IF YOU DO NOT MEET THESE CRITERIA, YOU ARE CONSIDERED LOW RISK FOR COVID-19.  What to do if you are HIGH RISK for COVID-19?  Marland Kitchen If you are having a medical emergency, call 911. . Seek medical care right away. Before you go to a doctor's office, urgent care or emergency department, .  call ahead and tell them about your recent travel, contact with someone diagnosed with COVID-19  .  and your symptoms.  . You should receive instructions from your physician's office regarding next steps of care.  . When you arrive at healthcare provider, tell the healthcare staff immediately you have returned from  . visiting [place], [place], [place], [place] or [place]; or traveled in the [place] to [place], [place],  . [place] or [place] in the last two weeks or you have been in close contact with a person diagnosed with  . COVID-19 in the last 2 weeks.   . Tell the health care staff about your symptoms: fever, cough and shortness of breath. . After you have been seen by a medical provider, you will be either: o Tested for (COVID-19) and  discharged home on quarantine except to seek medical care if  o symptoms worsen, and asked to  - Stay home and avoid contact with others until you get your results (4-5 days)  - Avoid travel on public transportation if possible (such as bus, train, or airplane) or o Sent to the Emergency Department by EMS for evaluation, COVID-19 testing  and  o possible admission depending on your condition and test results.  What to do if you are LOW RISK for COVID-19?  Reduce your risk of any infection by using the same precautions used for avoiding the common cold or flu:  Marland Kitchen Wash your hands often with soap and warm water for at least 20 seconds.  If soap and water are not readily available,  . use an alcohol-based hand sanitizer with at least 60% alcohol.  . If coughing or sneezing, cover your mouth and nose by coughing or sneezing into the elbow areas of your shirt or coat, .  into a tissue or into your sleeve (not your hands). . Avoid shaking hands with others and consider head nods or verbal greetings only. . Avoid touching your eyes, nose, or mouth with unwashed hands.  . Avoid close contact with people who are sick. . Avoid places or events with large numbers of people in one location, like concerts or sporting events. . Carefully consider travel plans  you have or are making. . If you are planning any travel outside or inside the [place], visit the CDC's Travelers' Health webpage for the latest health notices. . If you have some symptoms but not all symptoms, continue to monitor at home and seek medical attention  . if your symptoms worsen. . If you are having a medical emergency, call 911. >>>>>>>>>>>>>>>>>>>>>>> Preventive Care for Adults  A healthy lifestyle and preventive care can promote health and wellness. Preventive health guidelines for women include the following key practices.  A routine yearly physical is a good way to check with your health care provider about your health and preventive  screening. It is a chance to share any concerns and updates on your health and to receive a thorough exam.  Visit your dentist for a routine exam and preventive care every 6 months. Brush your teeth twice a day and floss once a day. Good oral hygiene prevents tooth decay and gum disease.  The frequency of eye exams is based on your age, health, family medical history, use of contact lenses, and other factors. Follow your health care provider's recommendations for frequency of eye exams.  Eat a healthy diet. Foods like vegetables, fruits, whole grains, low-fat dairy products, and lean protein foods contain the nutrients you need without too many calories. Decrease your intake of foods high in solid fats, added sugars, and salt. Eat the right amount of calories for you. Get information about a proper diet from your health care provider, if necessary.  Regular physical exercise is one of the most important things you can do for your health. Most adults should get at least 150 minutes of moderate-intensity exercise (any activity that increases your heart rate and causes you to sweat) each week. In addition, most adults need muscle-strengthening exercises on 2 or more days a week.  Maintain a healthy weight. The body mass index (BMI) is a screening tool to identify possible weight problems. It provides an estimate of body fat based on height and weight. Your health care provider can find your BMI and can help you achieve or maintain a healthy weight. For adults 20 years and older:  A BMI below 18.5 is considered underweight.  A BMI of 18.5 to 24.9 is normal.  A BMI of 25 to 29.9 is considered overweight.  A BMI of 30 and above is considered obese.  Maintain normal blood lipids and cholesterol levels by exercising and minimizing your intake of saturated fat. Eat a balanced diet with plenty of fruit and vegetables. Blood tests for lipids and cholesterol should begin at age 66 and be repeated every 5  years. If your lipid or cholesterol levels are high, you are over 50, or you are at high risk for heart disease, you may need your cholesterol levels checked more frequently. Ongoing high lipid and cholesterol levels should be treated with medicines if diet and exercise are not working.  If you smoke, find out from your health care provider how to quit. If you do not use tobacco, do not start.  Lung cancer screening is recommended for adults aged 13-80 years who are at high risk for developing lung cancer because of a history of smoking. A yearly low-dose CT scan of the lungs is recommended for people who have at least a 30-pack-year history of smoking and are a current smoker or have quit within the past 15 years. A pack year of smoking is smoking an average of 1 pack of cigarettes a day  for 1 year (for example: 1 pack a day for 30 years or 2 packs a day for 15 years). Yearly screening should continue until the smoker has stopped smoking for at least 15 years. Yearly screening should be stopped for people who develop a health problem that would prevent them from having lung cancer treatment.  High blood pressure causes heart disease and increases the risk of stroke. Your blood pressure should be checked at least every 1 to 2 years. Ongoing high blood pressure should be treated with medicines if weight loss and exercise do not work.  If you are 1-46 years old, ask your health care provider if you should take aspirin to prevent strokes.  Diabetes screening involves taking a blood sample to check your fasting blood sugar level. This should be done once every 3 years, after age 66, if you are within normal weight and without risk factors for diabetes. Testing should be considered at a younger age or be carried out more frequently if you are overweight and have at least 1 risk factor for diabetes.  Breast cancer screening is essential preventive care for women. You should practice "breast self-awareness."  This means understanding the normal appearance and feel of your breasts and may include breast self-examination. Any changes detected, no matter how small, should be reported to a health care provider. Women in their 51s and 30s should have a clinical breast exam (CBE) by a health care provider as part of a regular health exam every 1 to 3 years. After age 3, women should have a CBE every year. Starting at age 40, women should consider having a mammogram (breast X-ray test) every year. Women who have a family history of breast cancer should talk to their health care provider about genetic screening. Women at a high risk of breast cancer should talk to their health care providers about having an MRI and a mammogram every year.  Breast cancer gene (BRCA)-related cancer risk assessment is recommended for women who have family members with BRCA-related cancers. BRCA-related cancers include breast, ovarian, tubal, and peritoneal cancers. Having family members with these cancers may be associated with an increased risk for harmful changes (mutations) in the breast cancer genes BRCA1 and BRCA2. Results of the assessment will determine the need for genetic counseling and BRCA1 and BRCA2 testing.  Routine pelvic exams to screen for cancer are no longer recommended for nonpregnant women who are considered low risk for cancer of the pelvic organs (ovaries, uterus, and vagina) and who do not have symptoms. Ask your health care provider if a screening pelvic exam is right for you.  If you have had past treatment for cervical cancer or a condition that could lead to cancer, you need Pap tests and screening for cancer for at least 20 years after your treatment. If Pap tests have been discontinued, your risk factors (such as having a new sexual partner) need to be reassessed to determine if screening should be resumed. Some women have medical problems that increase the chance of getting cervical cancer. In these cases, your  health care provider may recommend more frequent screening and Pap tests.  Colorectal cancer can be detected and often prevented. Most routine colorectal cancer screening begins at the age of 61 years and continues through age 26 years. However, your health care provider may recommend screening at an earlier age if you have risk factors for colon cancer. On a yearly basis, your health care provider may provide home test kits to check  for hidden blood in the stool. Use of a small camera at the end of a tube, to directly examine the colon (sigmoidoscopy or colonoscopy), can detect the earliest forms of colorectal cancer. Talk to your health care provider about this at age 50, when routine screening begins.  Direct exam of the colon should be repeated every 5-10 years through age 75 years, unless early forms of pre-cancerous polyps or small growths are found.  Hepatitis C blood testing is recommended for all people born from 1945 through 1965 and any individual with known risks for hepatitis C.  Pra  Osteoporosis is a disease in which the bones lose minerals and strength with aging. This can result in serious bone fractures or breaks. The risk of osteoporosis can be identified using a bone density scan. Women ages 65 years and over and women at risk for fractures or osteoporosis should discuss screening with their health care providers. Ask your health care provider whether you should take a calcium supplement or vitamin D to reduce the rate of osteoporosis.  Menopause can be associated with physical symptoms and risks. Hormone replacement therapy is available to decrease symptoms and risks. You should talk to your health care provider about whether hormone replacement therapy is right for you.  Use sunscreen. Apply sunscreen liberally and repeatedly throughout the day. You should seek shade when your shadow is shorter than you. Protect yourself by wearing long sleeves, pants, a wide-brimmed hat, and  sunglasses year round, whenever you are outdoors.  Once a month, do a whole body skin exam, using a mirror to look at the skin on your back. Tell your health care provider of new moles, moles that have irregular borders, moles that are larger than a pencil eraser, or moles that have changed in shape or color.  Stay current with required vaccines (immunizations).  Influenza vaccine. All adults should be immunized every year.  Tetanus, diphtheria, and acellular pertussis (Td, Tdap) vaccine. Pregnant women should receive 1 dose of Tdap vaccine during each pregnancy. The dose should be obtained regardless of the length of time since the last dose. Immunization is preferred during the 27th-36th week of gestation. An adult who has not previously received Tdap or who does not know her vaccine status should receive 1 dose of Tdap. This initial dose should be followed by tetanus and diphtheria toxoids (Td) booster doses every 10 years. Adults with an unknown or incomplete history of completing a 3-dose immunization series with Td-containing vaccines should begin or complete a primary immunization series including a Tdap dose. Adults should receive a Td booster every 10 years.  Varicella vaccine. An adult without evidence of immunity to varicella should receive 2 doses or a second dose if she has previously received 1 dose. Pregnant females who do not have evidence of immunity should receive the first dose after pregnancy. This first dose should be obtained before leaving the health care facility. The second dose should be obtained 4-8 weeks after the first dose.  Human papillomavirus (HPV) vaccine. Females aged 13-26 years who have not received the vaccine previously should obtain the 3-dose series. The vaccine is not recommended for use in pregnant females. However, pregnancy testing is not needed before receiving a dose. If a female is found to be pregnant after receiving a dose, no treatment is needed. In that  case, the remaining doses should be delayed until after the pregnancy. Immunization is recommended for any person with an immunocompromised condition through the age of 26 years   if she did not get any or all doses earlier. During the 3-dose series, the second dose should be obtained 4-8 weeks after the first dose. The third dose should be obtained 24 weeks after the first dose and 16 weeks after the second dose.  Zoster vaccine. One dose is recommended for adults aged 60 years or older unless certain conditions are present.  Measles, mumps, and rubella (MMR) vaccine. Adults born before 1957 generally are considered immune to measles and mumps. Adults born in 1957 or later should have 1 or more doses of MMR vaccine unless there is a contraindication to the vaccine or there is laboratory evidence of immunity to each of the three diseases. A routine second dose of MMR vaccine should be obtained at least 28 days after the first dose for students attending postsecondary schools, health care workers, or international travelers. People who received inactivated measles vaccine or an unknown type of measles vaccine during 1963-1967 should receive 2 doses of MMR vaccine. People who received inactivated mumps vaccine or an unknown type of mumps vaccine before 1979 and are at high risk for mumps infection should consider immunization with 2 doses of MMR vaccine. For females of childbearing age, rubella immunity should be determined. If there is no evidence of immunity, females who are not pregnant should be vaccinated. If there is no evidence of immunity, females who are pregnant should delay immunization until after pregnancy. Unvaccinated health care workers born before 1957 who lack laboratory evidence of measles, mumps, or rubella immunity or laboratory confirmation of disease should consider measles and mumps immunization with 2 doses of MMR vaccine or rubella immunization with 1 dose of MMR vaccine.  Pneumococcal  13-valent conjugate (PCV13) vaccine. When indicated, a person who is uncertain of her immunization history and has no record of immunization should receive the PCV13 vaccine. An adult aged 19 years or older who has certain medical conditions and has not been previously immunized should receive 1 dose of PCV13 vaccine. This PCV13 should be followed with a dose of pneumococcal polysaccharide (PPSV23) vaccine. The PPSV23 vaccine dose should be obtained at least 1 or more year(s) after the dose of PCV13 vaccine. An adult aged 19 years or older who has certain medical conditions and previously received 1 or more doses of PPSV23 vaccine should receive 1 dose of PCV13. The PCV13 vaccine dose should be obtained 1 or more years after the last PPSV23 vaccine dose.    Pneumococcal polysaccharide (PPSV23) vaccine. When PCV13 is also indicated, PCV13 should be obtained first. All adults aged 65 years and older should be immunized. An adult younger than age 65 years who has certain medical conditions should be immunized. Any person who resides in a nursing home or long-term care facility should be immunized. An adult smoker should be immunized. People with an immunocompromised condition and certain other conditions should receive both PCV13 and PPSV23 vaccines. People with human immunodeficiency virus (HIV) infection should be immunized as soon as possible after diagnosis. Immunization during chemotherapy or radiation therapy should be avoided. Routine use of PPSV23 vaccine is not recommended for American Indians, [place] Natives, or people younger than 65 years unless there are medical conditions that require PPSV23 vaccine. When indicated, people who have unknown immunization and have no record of immunization should receive PPSV23 vaccine. One-time revaccination 5 years after the first dose of PPSV23 is recommended for people aged 19-64 years who have chronic kidney failure, nephrotic syndrome, asplenia, or  immunocompromised conditions. People who received 1-2   doses of PPSV23 before age 65 years should receive another dose of PPSV23 vaccine at age 65 years or later if at least 5 years have passed since the previous dose. Doses of PPSV23 are not needed for people immunized with PPSV23 at or after age 65 years.  Preventive Services / Frequency   Ages 40 to 64 years  Blood pressure check.  Lipid and cholesterol check.  Lung cancer screening. / Every year if you are aged 55-80 years and have a 30-pack-year history of smoking and currently smoke or have quit within the past 15 years. Yearly screening is stopped once you have quit smoking for at least 15 years or develop a health problem that would prevent you from having lung cancer treatment.  Clinical breast exam.** / Every year after age 40 years.   BRCA-related cancer risk assessment.** / For women who have family members with a BRCA-related cancer (breast, ovarian, tubal, or peritoneal cancers).  Mammogram.** / Every year beginning at age 40 years and continuing for as long as you are in good health. Consult with your health care provider.  Pap test.** / Every 3 years starting at age 30 years through age 65 or 70 years with a history of 3 consecutive normal Pap tests.  HPV screening.** / Every 3 years from ages 30 years through ages 65 to 70 years with a history of 3 consecutive normal Pap tests.  Fecal occult blood test (FOBT) of stool. / Every year beginning at age 50 years and continuing until age 75 years. You may not need to do this test if you get a colonoscopy every 10 years.  Flexible sigmoidoscopy or colonoscopy.** / Every 5 years for a flexible sigmoidoscopy or every 10 years for a colonoscopy beginning at age 50 years and continuing until age 75 years.  Hepatitis C blood test.** / For all people born from 1945 through 1965 and any individual with known risks for hepatitis C.  Skin self-exam. / Monthly.  Influenza vaccine. /  Every year.  Tetanus, diphtheria, and acellular pertussis (Tdap/Td) vaccine.** / Consult your health care provider. Pregnant women should receive 1 dose of Tdap vaccine during each pregnancy. 1 dose of Td every 10 years.  Varicella vaccine.** / Consult your health care provider. Pregnant females who do not have evidence of immunity should receive the first dose after pregnancy.  Zoster vaccine.** / 1 dose for adults aged 60 years or older.  Pneumococcal 13-valent conjugate (PCV13) vaccine.** / Consult your health care provider.  Pneumococcal polysaccharide (PPSV23) vaccine.** / 1 to 2 doses if you smoke cigarettes or if you have certain conditions.  Meningococcal vaccine.** / Consult your health care provider.  Hepatitis A vaccine.** / Consult your health care provider.  Hepatitis B vaccine.** / Consult your health care provider. Screening for abdominal aortic aneurysm (AAA)  by ultrasound is recommended for people over 50 who have history of high blood pressure or who are current or former smokers. ++++++++++++++++++ Recommend Adult Low Dose Aspirin or  coated  Aspirin 81 mg daily  To reduce risk of Colon Cancer 40 %,  Skin Cancer 26 % ,  Melanoma 46%  and  Pancreatic cancer 60% +++++++++++++++++++ Vitamin D goal  is between 70-100.  Please make sure that you are taking your Vitamin D as directed.  It is very important as a natural anti-inflammatory  helping hair, skin, and nails, as well as reducing stroke and heart attack risk.  It helps your bones and helps with mood. It   also decreases numerous cancer risks so please take it as directed.  Low Vit D is associated with a 200-300% higher risk for CANCER  and 200-300% higher risk for HEART   ATTACK  &  STROKE.   .....................................Marland Kitchen It is also associated with higher death rate at younger ages,  autoimmune diseases like Rheumatoid arthritis, Lupus, Multiple Sclerosis.    Also many other serious conditions, like  depression, Alzheimer's Dementia, infertility, muscle aches, fatigue, fibromyalgia - just to name a few. ++++++++++++++++++ Recommend the book "The END of DIETING" by Dr Excell Seltzer  & the book "The END of DIABETES " by Dr Excell Seltzer At [email or web address] - get book & Audio CD's    Being diabetic has a  300% increased risk for heart attack, stroke, cancer, and alzheimer- type vascular dementia. It is very important that you work harder with diet by avoiding all foods that are white. Avoid white rice (brown & wild rice is OK), white potatoes (sweetpotatoes in moderation is OK), White bread or wheat bread or anything made out of white flour like bagels, donuts, rolls, buns, biscuits, cakes, pastries, cookies, pizza crust, and pasta (made from white flour & egg whites) - vegetarian pasta or spinach or wheat pasta is OK. Multigrain breads like Arnold's or [employer], or multigrain sandwich thins or flatbreads.  Diet, exercise and weight loss can reverse and cure diabetes in the early stages.  Diet, exercise and weight loss is very important in the control and prevention of complications of diabetes which affects every system in your body, ie. Brain - dementia/stroke, eyes - glaucoma/blindness, heart - heart attack/heart failure, kidneys - dialysis, stomach - gastric paralysis, intestines - malabsorption, nerves - severe painful neuritis, circulation - gangrene & loss of a leg(s), and finally cancer and Alzheimers.    I recommend avoid fried & greasy foods,  sweets/candy, white rice (brown or wild rice or Quinoa is OK), white potatoes (sweet potatoes are OK) - anything made from white flour - bagels, doughnuts, rolls, buns, biscuits,white and wheat breads, pizza crust and traditional pasta made of white flour & egg white(vegetarian pasta or spinach or wheat pasta is OK).  Multi-grain bread is OK - like multi-grain flat bread or sandwich thins. Avoid alcohol in excess. Exercise is also important.    Eat all the  vegetables you want - avoid meat, especially red meat and dairy - especially cheese.  Cheese is the most concentrated form of trans-fats which is the worst thing to clog up our arteries. Veggie cheese is OK which can be found in the fresh produce section at [employer] or [employer] or [employer]  ++++++++++++++++++++++ DASH Eating Plan  DASH stands for "Dietary Approaches to Stop Hypertension."   The DASH eating plan is a healthy eating plan that has been shown to reduce high blood pressure (hypertension). Additional health benefits may include reducing the risk of type 2 diabetes mellitus, heart disease, and stroke. The DASH eating plan may also help with weight loss. WHAT DO I NEED TO KNOW ABOUT THE DASH EATING PLAN? For the DASH eating plan, you will follow these general guidelines:  Choose foods with a percent daily value for sodium of less than 5% (as listed on the food label).  Use salt-free seasonings or herbs instead of table salt or sea salt.  Check with your health care provider or pharmacist before using salt substitutes.  Eat lower-sodium products, often labeled as "lower sodium" or "no salt added."  Eat fresh foods.  Eat  more vegetables, fruits, and low-fat dairy products.  Choose whole grains. Look for the word "whole" as the first word in the ingredient list.  Choose fish   Limit sweets, desserts, sugars, and sugary drinks.  Choose heart-healthy fats.  Eat veggie cheese   Eat more home-cooked food and less restaurant, buffet, and fast food.  Limit fried foods.  Cook foods using methods other than frying.  Limit canned vegetables. If you do use them, rinse them well to decrease the sodium.  When eating at a restaurant, ask that your food be prepared with less salt, or no salt if possible.                      WHAT FOODS CAN I EAT? Read Dr Fara Olden Fuhrman's books on The End of Dieting & The End of Diabetes  Grains Whole grain or whole wheat bread. Brown  rice. Whole grain or whole wheat pasta. Quinoa, bulgur, and whole grain cereals. Low-sodium cereals. Corn or whole wheat flour tortillas. Whole grain cornbread. Whole grain crackers. Low-sodium crackers.  Vegetables Fresh or frozen vegetables (raw, steamed, roasted, or grilled). Low-sodium or reduced-sodium tomato and vegetable juices. Low-sodium or reduced-sodium tomato sauce and paste. Low-sodium or reduced-sodium canned vegetables.   Fruits All fresh, canned (in natural juice), or frozen fruits.  Protein Products  All fish and seafood.  Dried beans, peas, or lentils. Unsalted nuts and seeds. Unsalted canned beans.  Dairy Low-fat dairy products, such as skim or 1% milk, 2% or reduced-fat cheeses, low-fat ricotta or cottage cheese, or plain low-fat yogurt. Low-sodium or reduced-sodium cheeses.  Fats and Oils Tub margarines without trans fats. Light or reduced-fat mayonnaise and salad dressings (reduced sodium). Avocado. Safflower, olive, or canola oils. Natural peanut or almond butter.  Other Unsalted popcorn and pretzels. The items listed above may not be a complete list of recommended foods or beverages. Contact your dietitian for more options.  ++++++++++++++++++  WHAT FOODS ARE NOT RECOMMENDED? Grains/ White flour or wheat flour White bread. White pasta. White rice. Refined cornbread. Bagels and croissants. Crackers that contain trans fat.  Vegetables  Creamed or fried vegetables. Vegetables in a . Regular canned vegetables. Regular canned tomato sauce and paste. Regular tomato and vegetable juices.  Fruits Dried fruits. Canned fruit in light or heavy syrup. Fruit juice.  Meat and Other Protein Products Meat in general - RED meat & White meat.  Fatty cuts of meat. Ribs, chicken wings, all processed meats as bacon, sausage, bologna, salami, fatback, hot dogs, bratwurst and packaged luncheon meats.  Dairy Whole or 2% milk, cream, half-and-half, and cream cheese. Whole-fat or  sweetened yogurt. Full-fat cheeses or blue cheese. Non-dairy creamers and whipped toppings. Processed cheese, cheese spreads, or cheese curds.  Condiments Onion and garlic salt, seasoned salt, table salt, and sea salt. Canned and packaged gravies. Worcestershire sauce. Tartar sauce. Barbecue sauce. Teriyaki sauce. Soy sauce, including reduced sodium. Steak sauce. Fish sauce. Oyster sauce. Cocktail sauce. Horseradish. Ketchup and mustard. Meat flavorings and tenderizers. Bouillon cubes. Hot sauce. Tabasco sauce. Marinades. Taco seasonings. Relishes.  Fats and Oils Butter, stick margarine, lard, shortening and bacon fat. Coconut, palm kernel, or palm oils. Regular salad dressings.  Pickles and olives. Salted popcorn and pretzels.  The items listed above may not be a complete list of foods and beverages to avoid.

## 2019-04-03 NOTE — Progress Notes (Signed)
Annual Screening/Preventative Visit & Comprehensive Evaluation &  Examination     This very nice 58 y.o.  MWF presents for a Screening /Preventative Visit & comprehensive evaluation and management of multiple medical co-morbidities.  Patient has been followed for HTN, HLD, T2_NIDDM  and Vitamin D Deficiency. Patient has  hx/o Rheumatoid Arthritis (2009) and is followed by Dr Dossie Der.       HTN predates since 37. Patient's BP has been controlled at home and patient denies any cardiac symptoms as chest pain, palpitations, shortness of breath, dizziness or ankle swelling. Today's BP is at goal - 140/84.      Patient's hyperlipidemia is controlled with diet and medications. Patient denies myalgias or other medication SE's. Last lipids were not at goal: Lab Results  Component Value Date   CHOL 236 (H) 12/18/2018   HDL 65 12/18/2018   LDLCALC 147 (H) 12/18/2018   TRIG 121 12/18/2018   CHOLHDL 3.6 12/18/2018      Patient has  Morbid Obesity (BMI 37+) and hx/o prediabetes  (A1c 5.9% / 2010)  & then with a fasting glucose of 300 mg% she was dx'd with Insulin Resistance in June 2019 and started on Metformin by Dr Suzette Battiest.  Patient denies reactive hypoglycemic symptoms, visual blurring, diabetic polys or paresthesias. Last A1c was Normal & at goal: Lab Results  Component Value Date   HGBA1C 5.5 12/18/2018      Finally, patient has history of Vitamin D Deficiency ("27" / 2009 & "37" / 2016) and last Vitamin D was at goal: Lab Results  Component Value Date   VD25OH 59 09/15/2018   Current Outpatient Medications on File Prior to Visit  Medication Sig  . Abatacept 125 MG/ML SOSY Inject 1 Syringe into the skin every 7 (seven) days.  Marland Kitchen acetaminophen (TYLENOL) 500 MG tablet Take 1 tablet (500 mg total) by mouth every 6 (six) hours as needed.  . Acetylcysteine (N-ACETYL-L-CYSTEINE PO) Take 1 capsule by mouth daily.  Marland Kitchen albuterol (PROVENTIL) (2.5 MG/3ML) 0.083% nebulizer solution Take 3 mLs (2.5 mg total)  by nebulization every 6 (six) hours as needed for wheezing or shortness of breath.  Marland Kitchen albuterol (VENTOLIN HFA) 108 (90 Base) MCG/ACT inhaler Use 2 inhalations 15 minutes apart every 4 hours as needed to rescue Asthma  . amitriptyline (ELAVIL) 50 MG tablet Take 50 mg by mouth at bedtime.   . ASPIRIN 81 PO Take by mouth daily.  . buprenorphine (BUTRANS) 10 MCG/HR PTWK patch Place 15 mcg onto the skin once a week.   . Cholecalciferol (VITAMIN D3) 5000 UNITS TABS Take 10,000 Units by mouth daily.   Marland Kitchen CRANBERRY PO Take by mouth daily.  . Eyelid Cleansers (AVENOVA) 0.01 % SOLN Place 1 application into both eyes 2 (two) times daily. Apply to eyelids BID  . FEMRING 0.1 MG/24HR RING INSERT 1 RING VAGINALLY EVERY 3 MONTHS  . fexofenadine (ALLEGRA) 180 MG tablet Take 180 mg by mouth daily.  . fluticasone furoate-vilanterol (BREO ELLIPTA) 200-25 MCG/INH AEPB Inhale 1 puff into the lungs daily. Use 1 inhalation daily. Rinse mouth afterwards.  . furosemide (LASIX) 40 MG tablet TAKE 1 TABLET BY MOUTH THREE TIMES DAILY FOR FLUID RETENTION (Patient taking differently: TAKE 1 TABLET BY MOUTH daily      takes as needed)  . L-GLUTAMINE PO Take 1 capsule by mouth daily.  Marland Kitchen l-methylfolate-B6-B12 (METANX) 3-35-2 MG TABS tablet Take 1 tablet by mouth daily.  . Levomefolate Glucosamine (METHYLFOLATE PO) Take 5,000 mg by mouth daily.  Marland Kitchen  levothyroxine (SYNTHROID) 125 MCG tablet Take 1 tablet daily on an empty stomach with only water for 30 minutes & no Antacid meds, Calcium or Magnesium for 4 hours & avoid Biotin  . liothyronine (CYTOMEL) 5 MCG tablet Take 1 tablet daily on an empty stomach with only water for 30 minutes & no Antacid meds, Calcium or Magnesium for 4 hours & avoid Biotin  . LOTEMAX 0.5 % GEL Place 1 drop into both eyes 3 (three) times daily.  . Magnesium Oxide 500 MG TABS Take 500 mg by mouth daily.  . Menaquinone-7 (VITAMIN K2) 100 MCG CAPS Take 1 capsule by mouth daily.  . metFORMIN (GLUCOPHAGE-XR) 500 MG  24 hr tablet Take 1 tablet by mouth after dinner  . methocarbamol (ROBAXIN) 750 MG tablet Take 750 mg by mouth 4 (four) times daily.  Marland Kitchen MILK THISTLE EXTRACT PO Take 2 capsules by mouth daily.  . montelukast (SINGULAIR) 10 MG tablet TAKE 1 TABLET(10 MG) BY MOUTH AT BEDTIME  . Omega-3 Fatty Acids (FISH OIL) 1200 MG CAPS Take 2 capsules by mouth daily.   Vladimir Faster Glycol-Propyl Glycol (SYSTANE) 0.4-0.3 % SOLN Place 1 drop into both eyes 4 (four) times daily.   . RESTASIS 0.05 % ophthalmic emulsion Place 1 drop into both eyes daily.   . rosuvastatin (CRESTOR) 40 MG tablet Take 1 tablet (40 mg total) by mouth daily.  . TURMERIC CURCUMIN PO Take 900 mg elemental calcium/kg/hr by mouth 2 (two) times daily.   No current facility-administered medications on file prior to visit.    Allergies  Allergen Reactions  . Ciprofloxacin Hives and Swelling    SWELLING REACTION UNSPECIFIED   . Levaquin [Levofloxacin] Hives  . Lyrica [Pregabalin] Other (See Comments)    Altered mental status ALTERED MENTAL STATUS HALLUCINATIONS  . Compazine [Prochlorperazine Edisylate]     UNSPECIFIED REACTION   . Hydrocodone Itching  . Morphine And Related Itching  . Penicillins Itching  . Sulfa Antibiotics Itching   Past Medical History:  Diagnosis Date  . Arthritis    OA, RA  . Asthma    with a cold  . Autoimmune deficiency syndrome ([place])   . Back pain   . Cancer ([place])    thyroid-1995  . Family history of adverse reaction to anesthesia    mother problems with n/v  . Fibromyalgia   . GERD (gastroesophageal reflux disease)   . Hepatitis    'A" when pt. was 12 yrs. old  . Hiatal hernia   . Hyperlipidemia   . Hypertension   . Hypothyroidism   . Pneumonia    Health Maintenance  Topic Date Due  . INFLUENZA VACCINE  04/07/2019  . MAMMOGRAM  02/10/2020  . COLONOSCOPY  10/12/2021  . TETANUS/TDAP  09/15/2022  . Hepatitis C Screening  Completed  . HIV Screening  Completed  . PAP SMEAR-Modifier   Discontinued   Immunization History  Administered Date(s) Administered  . DTaP 09/15/2012, 09/15/2012  . Influenza Inj Mdck Quad With Preservative 08/02/2017  . Influenza Whole 09/06/2012  . Influenza-Unspecified 05/07/2014, 05/20/2016  . Pneumococcal Conjugate-13 09/15/2012, 07/05/2014  . Zoster 09/03/2013   Last Colon - 10/13/2011 - Dr Fuller Plan - recc 10 yr f/u - due Feb 2023  Last MGM -  02/09/2018  Past Surgical History:  Procedure Laterality Date  . ANTERIOR CERVICAL DECOMP/DISCECTOMY FUSION N/A 05/30/2017   Procedure: Cervical five-six, Cervical six-seven Anterior discectomy with fusion and plate fixation;  Surgeon: Ditty, Kevan Ny, MD;  Location: [place];  Service:  Neurosurgery;  Laterality: N/A;  . ANTERIOR LAT LUMBAR FUSION Left 01/26/2013   Procedure: ANTERIOR LATERAL LUMBAR FUSION 1 LEVEL;  Surgeon: Faythe Ghee, MD;  Location: [place] [facility];  Service: Neurosurgery;  Laterality: Left;  lumbar four-five  . KNEE ARTHROPLASTY Left 02/16/2016   Procedure: COMPUTER ASSISTED TOTAL KNEE ARTHROPLASTY;  Surgeon: Dereck Leep, MD;  Location: [facility];  Service: Orthopedics;  Laterality: Left;  . KNEE SURGERY     3 on right knee, 1 on left-prior to TKR  . LUMBAR PERCUTANEOUS PEDICLE SCREW 1 LEVEL Left 01/26/2013   Procedure: LUMBAR PERCUTANEOUS PEDICLE SCREW 1 LEVEL;  Surgeon: Faythe Ghee, MD;  Location: [place] [facility];  Service: Neurosurgery;  Laterality: Left;  lumbar four-five  . PILONIDAL CYST EXCISION    . THYROIDECTOMY    . TONSILLECTOMY    . TOTAL KNEE ARTHROPLASTY     right  . UPPER GASTROINTESTINAL ENDOSCOPY    . VAGINAL HYSTERECTOMY     Family History  Problem Relation Age of Onset  . Hypertension Father   . Stroke Father   . Diabetes Brother   . Asthma Brother   . Other Daughter        XED CONNECTIVE TISSUE  . Cancer Maternal Grandmother        BREAST  . Breast cancer Maternal Grandmother 33  . Colon cancer Neg Hx   . Esophageal cancer Neg Hx   . Stomach  cancer Neg Hx   . Rectal cancer Neg Hx    Social History   Tobacco Use  . Smoking status: Former Smoker    Packs/day: 0.50    Years: 40.00    Pack years: 20.00    Types: Cigarettes    Quit date: 07/05/2015    Years since quitting: 3.7  . Smokeless tobacco: Never Used  Substance Use Topics  . Alcohol use: Yes    Alcohol/week: 0.0 standard drinks    Comment: rare  . Drug use: No    ROS Constitutional: Denies fever, chills, weight loss/gain, headaches, insomnia,  night sweats, and change in appetite. Does c/o fatigue. Eyes: Denies redness, blurred vision, diplopia, discharge, itchy, watery eyes.  ENT: Denies discharge, congestion, post nasal drip, epistaxis, sore throat, earache, hearing loss, dental pain, Tinnitus, Vertigo, Sinus pain, snoring.  Cardio: Denies chest pain, palpitations, irregular heartbeat, syncope, dyspnea, diaphoresis, orthopnea, PND, claudication, edema Respiratory: denies cough, dyspnea, DOE, pleurisy, hoarseness, laryngitis, wheezing.  Gastrointestinal: Denies dysphagia, heartburn, reflux, water brash, pain, cramps, nausea, vomiting, bloating, diarrhea, constipation, hematemesis, melena, hematochezia, jaundice, hemorrhoids Genitourinary: Denies dysuria, frequency, urgency, nocturia, hesitancy, discharge, hematuria, flank pain Breast: Breast lumps, nipple discharge, bleeding.  Musculoskeletal: Denies arthralgia, myalgia, stiffness, Jt. Swelling, pain, limp, and strain/sprain. Denies falls. Skin: Denies puritis, rash, hives, warts, acne, eczema, changing in skin lesion Neuro: No weakness, tremor, incoordination, spasms, paresthesia, pain Psychiatric: Denies confusion, memory loss, sensory loss. Denies Depression. Endocrine: Denies change in weight, skin, hair change, nocturia, and paresthesia, diabetic polys, visual blurring, hyper / hypo glycemic episodes.  Heme/Lymph: No excessive bleeding, bruising, enlarged lymph nodes.  Physical Exam  BP 140/84   Pulse 64    Temp 97.8 F (36.6 C)   Resp 16   Ht 5' 2.5" (1.588 m)   Wt 207 lb 3.2 oz (94 kg)   LMP  (LMP Unknown)   BMI 37.29 kg/m   General Appearance: Over  nourished, well groomed and in no apparent distress.  Eyes: PERRLA, EOMs, conjunctiva no swelling or erythema, normal fundi  and vessels. Sinuses: No frontal/maxillary tenderness ENT/Mouth: EACs patent / TMs  nl. Nares clear without erythema, swelling, mucoid exudates. Oral hygiene is good. No erythema, swelling, or exudate. Tongue normal, non-obstructing. Tonsils not swollen or erythematous. Hearing normal.  Neck: Supple, thyroid not palpable. No bruits, nodes or JVD. Respiratory: Respiratory effort normal.  BS equal and clear bilateral without rales, rhonci, wheezing or stridor. Cardio: Heart sounds are normal with regular rate and rhythm and no murmurs, rubs or gallops. Peripheral pulses are normal and equal bilaterally without edema. No aortic or femoral bruits. Chest: symmetric with normal excursions and percussion. Breasts: Symmetric, without lumps, nipple discharge, retractions, or fibrocystic changes.  Abdomen: Flat, soft with bowel sounds active. Nontender, no guarding, rebound, hernias, masses, or organomegaly.  Lymphatics: Non tender without lymphadenopathy.  Genitourinary:  Musculoskeletal: Full ROM all peripheral extremities, joint stability, 5/5 strength, and normal gait. Skin: Warm and dry without rashes, lesions, cyanosis, clubbing or  ecchymosis.  Neuro: Cranial nerves intact, reflexes equal bilaterally. Normal muscle tone, no cerebellar symptoms. Sensation intact to touch, vibratory and Monofilament to the toes bilaterally. Pysch: Alert and oriented x 3, normal affect, Insight and Judgment appropriate.   Assessment and Plan  1. Annual Preventative Screening Examination   2. Essential hypertension  - EKG 12-Lead - Urinalysis, Routine w reflex microscopic - Microalbumin / creatinine urine ratio - CBC with  Differential/Platelet - COMPLETE METABOLIC PANEL WITH GFR - Magnesium - TSH  3. Hyperlipidemia, mixed  - EKG 12-Lead - Lipid panel - TSH  4. Insulin resistance  - EKG 12-Lead - Urinalysis, Routine w reflex microscopic - Microalbumin / creatinine urine ratio - HM DIABETES FOOT EXAM - LOW EXTREMITY NEUR EXAM DOCUM - Hemoglobin A1c - Insulin, random  5. Vitamin D deficiency   6. Rheumatoid arthritis  ([place])   7. Hypothyroidism, unspecified type  - Lipid panel  8. Screening for colorectal cancer  - POC Hemoccult Bld/Stl  9. Screening for ischemic heart disease  - EKG 12-Lead  10. Family history of cardiovascular disease  - EKG 12-Lead  11. Former smoker  - EKG 12-Lead  12. Medication management  - Urinalysis, Routine w reflex microscopic - Microalbumin / creatinine urine ratio - CBC with Differential/Platelet - COMPLETE METABOLIC PANEL WITH GFR - Magnesium - Lipid panel - TSH - Hemoglobin A1c - Insulin, random - VITAMIN D 25 Hydroxyl       Patient was counseled in prudent diet to achieve/maintain BMI less than 25 for weight control, BP monitoring, regular exercise and medications. Discussed med's effects and SE's. Screening labs and tests as requested with regular follow-up as recommended. Over 40 minutes of exam, counseling, chart review and high complex critical decision making was performed.   Kirtland Bouchard, MD

## 2019-04-04 ENCOUNTER — Other Ambulatory Visit: Payer: Self-pay

## 2019-04-04 ENCOUNTER — Ambulatory Visit (INDEPENDENT_AMBULATORY_CARE_PROVIDER_SITE_OTHER): Payer: Medicare Other | Admitting: Internal Medicine

## 2019-04-04 VITALS — BP 140/84 | HR 64 | Temp 97.8°F | Resp 16 | Ht 62.5 in | Wt 207.2 lb

## 2019-04-04 DIAGNOSIS — E782 Mixed hyperlipidemia: Secondary | ICD-10-CM | POA: Diagnosis not present

## 2019-04-04 DIAGNOSIS — Z136 Encounter for screening for cardiovascular disorders: Secondary | ICD-10-CM

## 2019-04-04 DIAGNOSIS — E559 Vitamin D deficiency, unspecified: Secondary | ICD-10-CM | POA: Diagnosis not present

## 2019-04-04 DIAGNOSIS — M069 Rheumatoid arthritis, unspecified: Secondary | ICD-10-CM

## 2019-04-04 DIAGNOSIS — I10 Essential (primary) hypertension: Secondary | ICD-10-CM

## 2019-04-04 DIAGNOSIS — Z0000 Encounter for general adult medical examination without abnormal findings: Secondary | ICD-10-CM

## 2019-04-04 DIAGNOSIS — E039 Hypothyroidism, unspecified: Secondary | ICD-10-CM

## 2019-04-04 DIAGNOSIS — Z0001 Encounter for general adult medical examination with abnormal findings: Secondary | ICD-10-CM

## 2019-04-04 DIAGNOSIS — Z87891 Personal history of nicotine dependence: Secondary | ICD-10-CM | POA: Diagnosis not present

## 2019-04-04 DIAGNOSIS — E8881 Metabolic syndrome: Secondary | ICD-10-CM

## 2019-04-04 DIAGNOSIS — Z8249 Family history of ischemic heart disease and other diseases of the circulatory system: Secondary | ICD-10-CM | POA: Diagnosis not present

## 2019-04-04 DIAGNOSIS — Z1211 Encounter for screening for malignant neoplasm of colon: Secondary | ICD-10-CM

## 2019-04-04 DIAGNOSIS — Z1212 Encounter for screening for malignant neoplasm of rectum: Secondary | ICD-10-CM

## 2019-04-04 DIAGNOSIS — Z79899 Other long term (current) drug therapy: Secondary | ICD-10-CM | POA: Diagnosis not present

## 2019-04-05 ENCOUNTER — Other Ambulatory Visit: Payer: Self-pay | Admitting: Internal Medicine

## 2019-04-05 LAB — CBC WITH DIFFERENTIAL/PLATELET
Absolute Monocytes: 331 cells/uL (ref 200–950)
Basophils Absolute: 29 cells/uL (ref 0–200)
Basophils Relative: 0.5 %
Eosinophils Absolute: 17 cells/uL (ref 15–500)
Eosinophils Relative: 0.3 %
HCT: 40.3 % (ref 35.0–45.0)
Hemoglobin: 13.3 g/dL (ref 11.7–15.5)
Lymphs Abs: 2917 cells/uL (ref 850–3900)
MCH: 28.6 pg (ref 27.0–33.0)
MCHC: 33 g/dL (ref 32.0–36.0)
MCV: 86.7 fL (ref 80.0–100.0)
MPV: 9.9 fL (ref 7.5–12.5)
Monocytes Relative: 5.7 %
Neutro Abs: 2506 cells/uL (ref 1500–7800)
Neutrophils Relative %: 43.2 %
Platelets: 347 10*3/uL (ref 140–400)
RBC: 4.65 10*6/uL (ref 3.80–5.10)
RDW: 13.2 % (ref 11.0–15.0)
Total Lymphocyte: 50.3 %
WBC: 5.8 10*3/uL (ref 3.8–10.8)

## 2019-04-05 LAB — URINALYSIS, ROUTINE W REFLEX MICROSCOPIC
Bacteria, UA: NONE SEEN /HPF
Bilirubin Urine: NEGATIVE
Glucose, UA: NEGATIVE
Hyaline Cast: NONE SEEN /LPF
Ketones, ur: NEGATIVE
Leukocytes,Ua: NEGATIVE
Nitrite: NEGATIVE
Specific Gravity, Urine: 1.017 (ref 1.001–1.03)
WBC, UA: NONE SEEN /HPF (ref 0–5)
pH: 6 (ref 5.0–8.0)

## 2019-04-05 LAB — LIPID PANEL
Cholesterol: 275 mg/dL — ABNORMAL HIGH (ref <200)
HDL: 63 mg/dL (ref >=50)
LDL Cholesterol (Calc): 187 mg/dL (calc) — ABNORMAL HIGH
Non-HDL Cholesterol (Calc): 212 mg/dL (calc) — ABNORMAL HIGH (ref <130)
Total CHOL/HDL Ratio: 4.4 (calc) (ref <5.0)
Triglycerides: 119 mg/dL (ref <150)

## 2019-04-05 LAB — COMPLETE METABOLIC PANEL WITH GFR
AG Ratio: 1.8 (calc) (ref 1.0–2.5)
ALT: 25 U/L (ref 6–29)
AST: 22 U/L (ref 10–35)
Albumin: 4.2 g/dL (ref 3.6–5.1)
Alkaline phosphatase (APISO): 72 U/L (ref 37–153)
BUN: 11 mg/dL (ref 7–25)
CO2: 27 mmol/L (ref 20–32)
Calcium: 9.6 mg/dL (ref 8.6–10.4)
Chloride: 105 mmol/L (ref 98–110)
Creat: 0.54 mg/dL (ref 0.50–1.05)
GFR, Est African American: 121 mL/min/{1.73_m2} (ref >=60)
GFR, Est Non African American: 104 mL/min/{1.73_m2} (ref >=60)
Globulin: 2.4 g/dL (calc) (ref 1.9–3.7)
Glucose, Bld: 95 mg/dL (ref 65–99)
Potassium: 4.2 mmol/L (ref 3.5–5.3)
Sodium: 139 mmol/L (ref 135–146)
Total Bilirubin: 0.3 mg/dL (ref 0.2–1.2)
Total Protein: 6.6 g/dL (ref 6.1–8.1)

## 2019-04-05 LAB — INSULIN, RANDOM: Insulin: 2.6 u[IU]/mL

## 2019-04-05 LAB — MICROALBUMIN / CREATININE URINE RATIO
Creatinine, Urine: 91 mg/dL (ref 20–275)
Microalb Creat Ratio: 114 mcg/mg creat — ABNORMAL HIGH (ref <30)
Microalb, Ur: 10.4 mg/dL

## 2019-04-05 LAB — TSH: TSH: 0.2 mIU/L — ABNORMAL LOW (ref 0.40–4.50)

## 2019-04-05 LAB — HEMOGLOBIN A1C
Hgb A1c MFr Bld: 5.4 % of total Hgb (ref <5.7)
Mean Plasma Glucose: 108 (calc)
eAG (mmol/L): 6 (calc)

## 2019-04-05 LAB — MAGNESIUM: Magnesium: 1.8 mg/dL (ref 1.5–2.5)

## 2019-04-05 LAB — VITAMIN D 25 HYDROXY (VIT D DEFICIENCY, FRACTURES): Vit D, 25-Hydroxy: 72 ng/mL (ref 30–100)

## 2019-04-17 NOTE — Telephone Encounter (Signed)
Patient requesting rx to gym.

## 2019-04-19 ENCOUNTER — Telehealth: Payer: Self-pay | Admitting: *Deleted

## 2019-04-19 NOTE — Telephone Encounter (Signed)
Patient notified an RX for water exercise is available for her to pick up. (DX-rheumatoid arthritis-M06.9)

## 2019-05-02 DIAGNOSIS — G894 Chronic pain syndrome: Secondary | ICD-10-CM | POA: Diagnosis not present

## 2019-05-02 DIAGNOSIS — M47817 Spondylosis without myelopathy or radiculopathy, lumbosacral region: Secondary | ICD-10-CM | POA: Diagnosis not present

## 2019-05-02 DIAGNOSIS — M961 Postlaminectomy syndrome, not elsewhere classified: Secondary | ICD-10-CM | POA: Diagnosis not present

## 2019-05-02 DIAGNOSIS — Z9689 Presence of other specified functional implants: Secondary | ICD-10-CM | POA: Diagnosis not present

## 2019-05-09 DIAGNOSIS — Z1231 Encounter for screening mammogram for malignant neoplasm of breast: Secondary | ICD-10-CM | POA: Diagnosis not present

## 2019-05-09 LAB — HM MAMMOGRAPHY

## 2019-05-16 ENCOUNTER — Encounter: Payer: Self-pay | Admitting: Adult Health

## 2019-05-16 ENCOUNTER — Other Ambulatory Visit: Payer: Self-pay

## 2019-05-16 ENCOUNTER — Ambulatory Visit (INDEPENDENT_AMBULATORY_CARE_PROVIDER_SITE_OTHER): Payer: Medicare Other | Admitting: Adult Health

## 2019-05-16 VITALS — BP 122/80 | HR 89 | Temp 96.8°F | Ht 62.5 in | Wt 199.8 lb

## 2019-05-16 DIAGNOSIS — L0390 Cellulitis, unspecified: Secondary | ICD-10-CM | POA: Diagnosis not present

## 2019-05-16 DIAGNOSIS — L732 Hidradenitis suppurativa: Secondary | ICD-10-CM

## 2019-05-16 MED ORDER — FUROSEMIDE 40 MG PO TABS: ORAL_TABLET | ORAL | 1 refills | Status: DC

## 2019-05-16 MED ORDER — CEPHALEXIN 500 MG PO CAPS: 500.0000 mg | ORAL_CAPSULE | Freq: Four times a day (QID) | ORAL | 0 refills | Status: AC

## 2019-05-16 NOTE — Progress Notes (Signed)
Assessment and Plan:  Autumn Frye was seen today for cyst.  Diagnoses and all orders for this visit:  Hidradenitis suppurativa/Cellulitis, left thigh, left labia Chronic hidradenitis managed by endocrinology - on doxycycline 50 mg daily, orencia, metformin Exam not suggestive of abscess appropriate for I&D at this time though some concern of overlying cellulitis; after discussion will initiate keflex 500 mg QID x 10 days Continue maintenance therapies Encouraged her to proceed with GYN evaluation if no significant improvement within 2-3 days Follow up endocrinology for discussion of prophylaxis therapies if frequent flares The patient was advised to call immediately if she has any concerning symptoms in the interval. The patient voices understanding of current treatment options and is in agreement with the current care plan.The patient knows to call the clinic with any problems, questions or concerns or go to the ER if any further progression of symptoms.  -     cephALEXin (KEFLEX) 500 MG capsule; Take 1 capsule (500 mg total) by mouth 4 (four) times daily for 10 days.  Further disposition pending results of labs. Discussed med's effects and SE's.   Over 15 minutes of exam, counseling, chart review, and critical decision making was performed.   Future Appointments  Date Time Provider [place]  07/05/2019 11:30 AM Liane Comber, NP [facility] None  10/11/2019 11:00 AM Unk Pinto, MD [facility] None  01/08/2020 11:15 AM Liane Comber, NP [facility] None  05/14/2020 10:00 AM Unk Pinto, MD [facility] None    ------------------------------------------------------------------------------------------------------------------   HPI BP 122/80   Pulse 89   Temp (!) 96.8 F (36 C)   Ht 5' 2.5" (1.588 m)   Wt 199 lb 12.8 oz (90.6 kg)   LMP  (LMP Unknown)   SpO2 98%   BMI 35.96 kg/m   58 y.o.female with hx RA, hidradenitis suppurativa presents for evaluation of tender  "abcess" to left thigh and perineum; has area to left thigh with surrounding soft tissue swelling/erythema, area to left lower labia majora over the last 2 weeks, over the last week has been feeling vaguely unwell, feels she may have had a low grade temp but has been normal when measured. She is concerned about infection progressing to sepsis. She contacted GYN to try to be seen but hasn't heard back yet.  She has been on doxycycline 50 mg daily for many years as prophylaxis for this issue, has been prescribed via endocrinology.   She is also on orencia/abatacept via rheumatology, and metformin by endocrinology.     Her blood pressure has been controlled at home, today their BP is BP: 122/80  She does workout. She denies chest pain, shortness of breath, dizziness.  BMI is Body mass index is 35.96 kg/m., She has been working aggressively on weight loss, swimming regularly, following meal plan Wt Readings from Last 3 Encounters:  05/16/19 199 lb 12.8 oz (90.6 kg)  04/04/19 207 lb 3.2 oz (94 kg)  12/27/18 202 lb (91.6 kg)    Past Medical History:  Diagnosis Date  . Arthritis    OA, RA  . Asthma    with a cold  . Autoimmune deficiency syndrome ([place])   . Back pain   . Cancer ([place])    thyroid-1995  . Family history of adverse reaction to anesthesia    mother problems with n/v  . Fibromyalgia   . GERD (gastroesophageal reflux disease)   . Hepatitis    'A" when pt. was 12 yrs. old  . Hiatal hernia   . Hyperlipidemia   .  Hypertension   . Hypothyroidism   . Pneumonia      Allergies  Allergen Reactions  . Ciprofloxacin Hives and Swelling    SWELLING REACTION UNSPECIFIED   . Levaquin [Levofloxacin] Hives  . Lyrica [Pregabalin] Other (See Comments)    Altered mental status ALTERED MENTAL STATUS HALLUCINATIONS  . Compazine [Prochlorperazine Edisylate]     UNSPECIFIED REACTION   . Hydrocodone Itching  . Morphine And Related Itching  . Penicillins Itching  . Sulfa Antibiotics  Itching    Current Outpatient Medications on File Prior to Visit  Medication Sig  . Abatacept 125 MG/ML SOSY Inject 1 Syringe into the skin every 7 (seven) days.  Marland Kitchen acetaminophen (TYLENOL) 500 MG tablet Take 1 tablet (500 mg total) by mouth every 6 (six) hours as needed.  . Acetylcysteine (N-ACETYL-L-CYSTEINE PO) Take 1 capsule by mouth daily.  Marland Kitchen albuterol (PROVENTIL) (2.5 MG/3ML) 0.083% nebulizer solution Take 3 mLs (2.5 mg total) by nebulization every 6 (six) hours as needed for wheezing or shortness of breath.  Marland Kitchen albuterol (VENTOLIN HFA) 108 (90 Base) MCG/ACT inhaler Use 2 inhalations 15 minutes apart every 4 hours as needed to rescue Asthma  . amitriptyline (ELAVIL) 50 MG tablet Take 50 mg by mouth at bedtime.   . ASPIRIN 81 PO Take by mouth daily.  . buprenorphine (BUTRANS) 10 MCG/HR PTWK patch Place 15 mcg onto the skin once a week.   . Cholecalciferol (VITAMIN D3) 5000 UNITS TABS Take 10,000 Units by mouth daily.   Marland Kitchen CRANBERRY PO Take by mouth daily.  . Eyelid Cleansers (AVENOVA) 0.01 % SOLN Place 1 application into both eyes 2 (two) times daily. Apply to eyelids BID  . FEMRING 0.1 MG/24HR RING INSERT 1 RING VAGINALLY EVERY 3 MONTHS  . fexofenadine (ALLEGRA) 180 MG tablet Take 180 mg by mouth daily.  . fluticasone furoate-vilanterol (BREO ELLIPTA) 200-25 MCG/INH AEPB Inhale 1 puff into the lungs daily. Use 1 inhalation daily. Rinse mouth afterwards.  . L-GLUTAMINE PO Take 1 capsule by mouth daily.  Marland Kitchen l-methylfolate-B6-B12 (METANX) 3-35-2 MG TABS tablet Take 1 tablet by mouth daily.  . Levomefolate Glucosamine (METHYLFOLATE PO) Take 5,000 mg by mouth daily.  Marland Kitchen levothyroxine (SYNTHROID) 125 MCG tablet Take 1 tablet daily on an empty stomach with only water for 30 minutes & no Antacid meds, Calcium or Magnesium for 4 hours & avoid Biotin  . liothyronine (CYTOMEL) 5 MCG tablet Take 1 tablet daily on an empty stomach with only water for 30 minutes & no Antacid meds, Calcium or Magnesium for  4 hours & avoid Biotin  . LOTEMAX 0.5 % GEL Place 1 drop into both eyes 3 (three) times daily.  . Magnesium Oxide 500 MG TABS Take 500 mg by mouth daily.  . Menaquinone-7 (VITAMIN K2) 100 MCG CAPS Take 1 capsule by mouth daily.  . metFORMIN (GLUCOPHAGE-XR) 500 MG 24 hr tablet Take 1 tablet by mouth after dinner  . methocarbamol (ROBAXIN) 750 MG tablet Take 750 mg by mouth 4 (four) times daily.  Marland Kitchen MILK THISTLE EXTRACT PO Take 2 capsules by mouth daily.  . montelukast (SINGULAIR) 10 MG tablet TAKE 1 TABLET(10 MG) BY MOUTH AT BEDTIME  . Omega-3 Fatty Acids (FISH OIL) 1200 MG CAPS Take 2 capsules by mouth daily.   Vladimir Faster Glycol-Propyl Glycol (SYSTANE) 0.4-0.3 % SOLN Place 1 drop into both eyes 4 (four) times daily.   . RESTASIS 0.05 % ophthalmic emulsion Place 1 drop into both eyes daily.   . rosuvastatin (CRESTOR) 40  MG tablet Take 1 tablet (40 mg total) by mouth daily.  . TURMERIC CURCUMIN PO Take 900 mg elemental calcium/kg/hr by mouth 2 (two) times daily.   No current facility-administered medications on file prior to visit.     ROS: all negative except above.   Physical Exam:  BP 122/80   Pulse 89   Temp (!) 96.8 F (36 C)   Ht 5' 2.5" (1.588 m)   Wt 199 lb 12.8 oz (90.6 kg)   LMP  (LMP Unknown)   SpO2 98%   BMI 35.96 kg/m   General Appearance: Well nourished, in no apparent distress. Eyes: PERRLA, conjunctiva no swelling or erythema ENT/Mouth: Ext aud canals clear, TMs without erythema, bulging. No erythema, swelling, or exudate on post pharynx.  Tonsils not swollen or erythematous. Hearing normal.  Neck: Supple  Respiratory: Respiratory effort normal, BS equal bilaterally without rales, rhonchi, wheezing or stridor.  Cardio: RRR with no MRGs. Brisk peripheral pulses without edema.  Abdomen: Soft, + BS.  Non tender, no guarding, rebound, hernias, masses. Lymphatics: Non tender without lymphadenopathy.  Musculoskeletal: normal gait.  Skin: Warm, dry; she has  erythematous area to left thigh approx 3 cm with subcutaneous firm cystic area appxox 1 cm; she has similar area to left labia majora inferiorly without significant erythema; no fluctuance or other palpable cystic structures  Neuro: Normal muscle tone, no cerebellar symptoms. Sensation intact.  Psych: Awake and oriented X 3, normal affect, Insight and Judgment appropriate.     Izora Ribas, NP 1:12 PM [facility]

## 2019-05-16 NOTE — Patient Instructions (Addendum)
Sending in keflex to cover for cellulitis (skin infection)  Please schedule follow up with GYN if hidradenitis is not improving  ER for any severe symptoms     Hidradenitis Suppurativa Hidradenitis suppurativa is a long-term (chronic) skin disease. It is similar to a severe form of acne, but it affects areas of the body where acne would be unusual, especially areas of the body where skin rubs against skin and becomes moist. These include:  Underarms.  Groin.  Genital area.  Buttocks.  Upper thighs.  Breasts. Hidradenitis suppurativa may start out as small lumps or pimples caused by blocked sweat glands or hair follicles. Pimples may develop into deep sores that break open (rupture) and drain pus. Over time, affected areas of skin may thicken and become scarred. This condition is rare and does not spread from person to person (non-contagious). What are the causes? The exact cause of this condition is not known. It may be related to:  Female and female hormones.  An overactive disease-fighting system (immune system). The immune system may over-react to blocked hair follicles or sweat glands and cause swelling and pus-filled sores. What increases the risk? You are more likely to develop this condition if you:  Are female.  Are 27-27 years old.  Have a family history of hidradenitis suppurativa.  Have a personal history of acne.  Are overweight.  Smoke.  Take the medicine lithium. What are the signs or symptoms? The first symptoms are usually painful bumps in the skin, similar to pimples. The condition may get worse over time (progress), or it may only cause mild symptoms. If the disease progresses, symptoms may include:  Skin bumps getting bigger and growing deeper into the skin.  Bumps rupturing and draining pus.  Itchy, infected skin.  Skin getting thicker and scarred.  Tunnels under the skin (fistulas) where pus drains from a bump.  Pain during daily  activities, such as pain during walking if your groin area is affected.  Emotional problems, such as stress or depression. This condition may affect your appearance and your ability or willingness to wear certain clothes or do certain activities. How is this diagnosed? This condition is diagnosed by a health care provider who specializes in skin diseases (dermatologist). You may be diagnosed based on:  Your symptoms and medical history.  A physical exam.  Testing a pus sample for infection.  Blood tests. How is this treated? Your treatment will depend on how severe your symptoms are. The same treatment will not work for everybody with this condition. You may need to try several treatments to find what works best for you. Treatment may include:  Cleaning and bandaging (dressing) your wounds as needed.  Lifestyle changes, such as new skin care routines.  Taking medicines, such as: ? Antibiotics. ? Acne medicines. ? Medicines to reduce the activity of the immune system. ? A diabetes medicine (metformin). ? Birth control pills, for women. ? Steroids to reduce swelling and pain.  Working with a mental health care provider, if you experience emotional distress due to this condition. If you have severe symptoms that do not get better with medicine, you may need surgery. Surgery may involve:  Using a laser to clear the skin and remove hair follicles.  Opening and draining deep sores.  Removing the areas of skin that are diseased and scarred. Follow these instructions at home: Medicines   Take over-the-counter and prescription medicines only as told by your health care provider.  If you were prescribed an  antibiotic medicine, take it as told by your health care provider. Do not stop taking the antibiotic even if your condition improves. Skin care  If you have open wounds, cover them with a clean dressing as told by your health care provider. Keep wounds clean by washing them  gently with soap and water when you bathe.  Do not shave the areas where you get hidradenitis suppurativa.  Do not wear deodorant.  Wear loose-fitting clothes.  Try to avoid getting overheated or sweaty. If you get sweaty or wet, change into clean, dry clothes as soon as you can.  To help relieve pain and itchiness, cover sore areas with a warm, clean washcloth (warm compress) for 5-10 minutes as often as needed.  If told by your health care provider, take a bleach bath twice a week: ? Fill your bathtub halfway with water. ? Pour in  cup of unscented household bleach. ? Soak in the tub for 5-10 minutes. ? Only soak from the neck down. Avoid water on your face and hair. ? Shower to rinse off the bleach from your skin. General instructions  Learn as much as you can about your disease so that you have an active role in your treatment. Work closely with your health care provider to find treatments that work for you.  If you are overweight, work with your health care provider to lose weight as recommended.  Do not use any products that contain nicotine or tobacco, such as cigarettes and e-cigarettes. If you need help quitting, ask your health care provider.  If you struggle with living with this condition, talk with your health care provider or work with a mental health care provider as recommended.  Keep all follow-up visits as told by your health care provider. This is important. Where to find more information  Hidradenitis [place].: https://www.hs-foundation.org/ Contact a health care provider if you have:  A flare-up of hidradenitis suppurativa.  A fever or chills.  Trouble controlling your symptoms at home.  Trouble doing your daily activities because of your symptoms.  Trouble dealing with emotional problems related to your condition. Summary  Hidradenitis suppurativa is a long-term (chronic) skin disease. It is similar to a severe form of acne, but  it affects areas of the body where acne would be unusual.  The first symptoms are usually painful bumps in the skin, similar to pimples. The condition may get worse over time (progress), or it may only cause mild symptoms.  If you have open wounds, cover them with a clean dressing as told by your health care provider. Keep wounds clean by washing them gently with soap and water when you bathe.  Besides skin care, treatment may include medicines, laser treatment, and surgery. This information is not intended to replace advice given to you by your health care provider. Make sure you discuss any questions you have with your health care provider. Document Released: 04/06/2004 Document Revised: 08/31/2017 Document Reviewed: 08/31/2017 Elsevier Patient Education  [place].     Cephalexin tablets or capsules What is this medicine? CEPHALEXIN (sef a LEX in) is a cephalosporin antibiotic. It is used to treat certain kinds of bacterial infections It will not work for colds, flu, or other viral infections. This medicine may be used for other purposes; ask your health care provider or pharmacist if you have questions. COMMON BRAND NAME(S): Biocef, Daxbia, Keflex, Keftab What should I tell my health care provider before I take this medicine? They need to know  if you have any of these conditions:  kidney disease  stomach or intestine problems, especially colitis  an unusual or allergic reaction to cephalexin, other cephalosporins, penicillins, other antibiotics, medicines, foods, dyes or preservatives  pregnant or trying to get pregnant  breast-feeding How should I use this medicine? Take this medicine by mouth with a full glass of water. Follow the directions on the prescription label. This medicine can be taken with or without food. Take your medicine at regular intervals. Do not take your medicine more often than directed. Take all of your medicine as directed even if you think you are  better. Do not skip doses or stop your medicine early. Talk to your pediatrician regarding the use of this medicine in children. While this drug may be prescribed for selected conditions, precautions do apply. Overdosage: If you think you have taken too much of this medicine contact a poison control center or emergency room at once. NOTE: This medicine is only for you. Do not share this medicine with others. What if I miss a dose? If you miss a dose, take it as soon as you can. If it is almost time for your next dose, take only that dose. Do not take double or extra doses. There should be at least 4 to 6 hours between doses. What may interact with this medicine?  probenecid  some other antibiotics This list may not describe all possible interactions. Give your health care provider a list of all the medicines, herbs, non-prescription drugs, or dietary supplements you use. Also tell them if you smoke, drink alcohol, or use illegal drugs. Some items may interact with your medicine. What should I watch for while using this medicine? Tell your doctor or health care provider if your symptoms do not begin to improve in a few days. This medicine may cause serious skin reactions. They can happen weeks to months after starting the medicine. Contact your health care provider right away if you notice fevers or flu-like symptoms with a rash. The rash may be red or purple and then turn into blisters or peeling of the skin. Or, you might notice a red rash with swelling of the face, lips or lymph nodes in your neck or under your arms. Do not treat diarrhea with over the counter products. Contact your doctor if you have diarrhea that lasts more than 2 days or if it is severe and watery. If you have diabetes, you may get a false-positive result for sugar in your urine. Check with your doctor or health care provider. What side effects may I notice from receiving this medicine? Side effects that you should report to  your doctor or health care professional as soon as possible:  allergic reactions like skin rash, itching or hives, swelling of the face, lips, or tongue  breathing problems  pain or trouble passing urine  redness, blistering, peeling or loosening of the skin, including inside the mouth  severe or watery diarrhea  unusually weak or tired  yellowing of the eyes, skin Side effects that usually do not require medical attention (report to your doctor or health care professional if they continue or are bothersome):  gas or heartburn  genital or anal irritation  headache  joint or muscle pain  nausea, vomiting This list may not describe all possible side effects. Call your doctor for medical advice about side effects. You may report side effects to FDA at 1-800-FDA-1088. Where should I keep my medicine? Keep out of the reach of  children. Store at room temperature between 59 and 86 degrees F (15 and 30 degrees C). Throw away any unused medicine after the expiration date. NOTE: This sheet is a summary. It may not cover all possible information. If you have questions about this medicine, talk to your doctor, pharmacist, or health care provider.  2020 Elsevier/Gold Standard (2018-12-01 07:00:28)

## 2019-05-30 DIAGNOSIS — M25579 Pain in unspecified ankle and joints of unspecified foot: Secondary | ICD-10-CM | POA: Diagnosis not present

## 2019-05-30 DIAGNOSIS — M25539 Pain in unspecified wrist: Secondary | ICD-10-CM | POA: Diagnosis not present

## 2019-05-30 DIAGNOSIS — G894 Chronic pain syndrome: Secondary | ICD-10-CM | POA: Diagnosis not present

## 2019-05-30 DIAGNOSIS — M47817 Spondylosis without myelopathy or radiculopathy, lumbosacral region: Secondary | ICD-10-CM | POA: Diagnosis not present

## 2019-06-11 ENCOUNTER — Other Ambulatory Visit: Payer: Self-pay | Admitting: Adult Health

## 2019-06-11 DIAGNOSIS — E782 Mixed hyperlipidemia: Secondary | ICD-10-CM

## 2019-06-15 ENCOUNTER — Other Ambulatory Visit: Payer: Self-pay | Admitting: Adult Health

## 2019-06-15 DIAGNOSIS — J45909 Unspecified asthma, uncomplicated: Secondary | ICD-10-CM

## 2019-06-20 DIAGNOSIS — Z79891 Long term (current) use of opiate analgesic: Secondary | ICD-10-CM | POA: Diagnosis not present

## 2019-06-20 DIAGNOSIS — M47817 Spondylosis without myelopathy or radiculopathy, lumbosacral region: Secondary | ICD-10-CM | POA: Diagnosis not present

## 2019-06-20 DIAGNOSIS — Z79899 Other long term (current) drug therapy: Secondary | ICD-10-CM | POA: Diagnosis not present

## 2019-06-20 DIAGNOSIS — M961 Postlaminectomy syndrome, not elsewhere classified: Secondary | ICD-10-CM | POA: Diagnosis not present

## 2019-06-20 DIAGNOSIS — G894 Chronic pain syndrome: Secondary | ICD-10-CM | POA: Diagnosis not present

## 2019-06-20 DIAGNOSIS — Z981 Arthrodesis status: Secondary | ICD-10-CM | POA: Diagnosis not present

## 2019-06-21 DIAGNOSIS — Z23 Encounter for immunization: Secondary | ICD-10-CM | POA: Diagnosis not present

## 2019-06-21 DIAGNOSIS — R7301 Impaired fasting glucose: Secondary | ICD-10-CM | POA: Diagnosis not present

## 2019-06-21 DIAGNOSIS — E890 Postprocedural hypothyroidism: Secondary | ICD-10-CM | POA: Diagnosis not present

## 2019-06-21 DIAGNOSIS — C73 Malignant neoplasm of thyroid gland: Secondary | ICD-10-CM | POA: Diagnosis not present

## 2019-06-27 ENCOUNTER — Encounter: Payer: Self-pay | Admitting: *Deleted

## 2019-06-28 DIAGNOSIS — R7301 Impaired fasting glucose: Secondary | ICD-10-CM | POA: Diagnosis not present

## 2019-06-28 DIAGNOSIS — E890 Postprocedural hypothyroidism: Secondary | ICD-10-CM | POA: Diagnosis not present

## 2019-06-28 DIAGNOSIS — C73 Malignant neoplasm of thyroid gland: Secondary | ICD-10-CM | POA: Diagnosis not present

## 2019-07-05 ENCOUNTER — Ambulatory Visit: Payer: Medicare Other | Admitting: Adult Health

## 2019-07-19 DIAGNOSIS — M47816 Spondylosis without myelopathy or radiculopathy, lumbar region: Secondary | ICD-10-CM | POA: Diagnosis not present

## 2019-07-19 DIAGNOSIS — M069 Rheumatoid arthritis, unspecified: Secondary | ICD-10-CM | POA: Diagnosis not present

## 2019-07-19 DIAGNOSIS — M25559 Pain in unspecified hip: Secondary | ICD-10-CM | POA: Diagnosis not present

## 2019-07-19 DIAGNOSIS — G894 Chronic pain syndrome: Secondary | ICD-10-CM | POA: Diagnosis not present

## 2019-08-08 DIAGNOSIS — H02889 Meibomian gland dysfunction of unspecified eye, unspecified eyelid: Secondary | ICD-10-CM | POA: Diagnosis not present

## 2019-08-08 DIAGNOSIS — M3501 Sicca syndrome with keratoconjunctivitis: Secondary | ICD-10-CM | POA: Diagnosis not present

## 2019-08-08 DIAGNOSIS — H04123 Dry eye syndrome of bilateral lacrimal glands: Secondary | ICD-10-CM | POA: Diagnosis not present

## 2019-08-08 DIAGNOSIS — H2513 Age-related nuclear cataract, bilateral: Secondary | ICD-10-CM | POA: Diagnosis not present

## 2019-08-23 DIAGNOSIS — E890 Postprocedural hypothyroidism: Secondary | ICD-10-CM | POA: Diagnosis not present

## 2019-08-29 ENCOUNTER — Other Ambulatory Visit: Payer: Self-pay

## 2019-08-29 ENCOUNTER — Ambulatory Visit: Payer: Medicare Other | Admitting: Family Medicine

## 2019-08-29 ENCOUNTER — Encounter: Payer: Self-pay | Admitting: Family Medicine

## 2019-08-29 DIAGNOSIS — M79604 Pain in right leg: Secondary | ICD-10-CM

## 2019-08-29 MED ORDER — PREDNISONE 10 MG PO TABS: ORAL_TABLET | ORAL | 0 refills | Status: DC

## 2019-08-29 MED ORDER — METHOCARBAMOL 750 MG PO TABS: 750.0000 mg | ORAL_TABLET | Freq: Four times a day (QID) | ORAL | 1 refills | Status: DC | PRN

## 2019-08-29 NOTE — Progress Notes (Signed)
Office Visit Note   Patient: Autumn Frye           Date of Birth: 09-25-1960           MRN: [id number] Visit Date: 08/29/2019 Requested by: Unk Pinto, [place] [address] [place],  [place] [address] PCP: Unk Pinto, MD  Subjective: Chief Complaint  Patient presents with  . Right Leg - Pain    Pain posterior buttock down posterior leg to ankle x 1 month. Been doing warm water stretches and took an old methacarbamol. The muscle relaxer helped some.    HPI: She is here with right leg pain.  Symptoms started about a month ago, no injury.  She was having pain in the buttocks area and then she stumbled 1 day and had sudden severe pain radiating down the leg to the foot.  She has a history of L4-5 fusion.  She has chronic pain syndrome and is treated by a pain clinic with fentanyl patches.  She has a spinal cord stimulator as well.  Denies any bowel or bladder dysfunction, denies any weakness in her leg.  She has tried a variety of stretches with minimal improvement.  She took some Robaxin left over and it helped.              ROS: No fevers or chills.  All other systems were reviewed and are negative.  Objective: Vital Signs: LMP  (LMP Unknown)   Physical Exam:  General:  Alert and oriented, in no acute distress. Pulm:  Breathing unlabored. Psy:  Normal mood, congruent affect. Skin: No rash. Right leg: She is tender in the sciatic notch and near the ischial tuberosity.  Straight leg raise is equivocal, lower extremity strength and reflexes are still normal.  Imaging: None today  Assessment & Plan: 1.  Right-sided sciatica, possible disc protrusion -We will try physical therapy at [facility], prednisone taper, Robaxin as needed. -Consider additional imaging if symptoms persist.     Procedures: No procedures performed  No notes on file     PMFS History: Patient Active Problem List   Diagnosis Date Noted  . Hidradenitis suppurativa  05/16/2019  . Morbid obesity ([place]) 11/08/2017  . Abnormal glucose 08/02/2017  . Cervical spondylosis with radiculopathy 05/30/2017  . Rheumatoid arthritis involving multiple sites ([place]) 11/14/2015  . Encounter for Medicare annual wellness exam 04/24/2015  . Sjogren's disease ([place]) 07/05/2014  . Medication management 03/26/2014  . Mixed hyperlipidemia 09/24/2013  . Vitamin D deficiency 09/24/2013  . Intrinsic asthma   . Hypertension   . Hypothyroidism   . Degeneration of intervertebral disc of lumbosacral region 05/23/2008   Past Medical History:  Diagnosis Date  . Arthritis    OA, RA  . Asthma    with a cold  . Autoimmune deficiency syndrome ([place])   . Back pain   . Cancer ([place])    thyroid-1995  . Family history of adverse reaction to anesthesia    mother problems with n/v  . Fibromyalgia   . GERD (gastroesophageal reflux disease)   . Hepatitis    'A" when pt. was 12 yrs. old  . Hiatal hernia   . Hyperlipidemia   . Hypertension   . Hypothyroidism   . Pneumonia     Family History  Problem Relation Age of Onset  . Hypertension Father   . Stroke Father   . Diabetes Brother   . Asthma Brother   . Other Daughter  XED CONNECTIVE TISSUE  . Cancer Maternal Grandmother        BREAST  . Breast cancer Maternal Grandmother 90  . Colon cancer Neg Hx   . Esophageal cancer Neg Hx   . Stomach cancer Neg Hx   . Rectal cancer Neg Hx     Past Surgical History:  Procedure Laterality Date  . ANTERIOR CERVICAL DECOMP/DISCECTOMY FUSION N/A 05/30/2017   Procedure: Cervical five-six, Cervical six-seven Anterior discectomy with fusion and plate fixation;  Surgeon: Ditty, Kevan Ny, MD;  Location: [facility];  Service: Neurosurgery;  Laterality: N/A;  . ANTERIOR LAT LUMBAR FUSION Left 01/26/2013   Procedure: ANTERIOR LATERAL LUMBAR FUSION 1 LEVEL;  Surgeon: Faythe Ghee, MD;  Location: [place] [facility];  Service: Neurosurgery;  Laterality: Left;  lumbar four-five  . KNEE  ARTHROPLASTY Left 02/16/2016   Procedure: COMPUTER ASSISTED TOTAL KNEE ARTHROPLASTY;  Surgeon: Dereck Leep, MD;  Location: [facility];  Service: Orthopedics;  Laterality: Left;  . KNEE SURGERY     3 on right knee, 1 on left-prior to TKR  . LUMBAR PERCUTANEOUS PEDICLE SCREW 1 LEVEL Left 01/26/2013   Procedure: LUMBAR PERCUTANEOUS PEDICLE SCREW 1 LEVEL;  Surgeon: Faythe Ghee, MD;  Location: [facility];  Service: Neurosurgery;  Laterality: Left;  lumbar four-five  . PILONIDAL CYST EXCISION    . THYROIDECTOMY    . TONSILLECTOMY    . TOTAL KNEE ARTHROPLASTY     right  . UPPER GASTROINTESTINAL ENDOSCOPY    . VAGINAL HYSTERECTOMY     Social History   Occupational History  . Not on file  Tobacco Use  . Smoking status: Former Smoker    Packs/day: 0.50    Years: 40.00    Pack years: 20.00    Types: Cigarettes    Quit date: 07/05/2015    Years since quitting: 4.1  . Smokeless tobacco: Never Used  Substance and Sexual Activity  . Alcohol use: Yes    Alcohol/week: 0.0 standard drinks    Comment: rare  . Drug use: No  . Sexual activity: Not on file

## 2019-09-09 ENCOUNTER — Other Ambulatory Visit: Payer: Self-pay | Admitting: Internal Medicine

## 2019-09-10 DIAGNOSIS — M545 Low back pain: Secondary | ICD-10-CM | POA: Diagnosis not present

## 2019-09-10 DIAGNOSIS — R269 Unspecified abnormalities of gait and mobility: Secondary | ICD-10-CM | POA: Diagnosis not present

## 2019-09-10 DIAGNOSIS — M25551 Pain in right hip: Secondary | ICD-10-CM | POA: Diagnosis not present

## 2019-09-10 DIAGNOSIS — M79604 Pain in right leg: Secondary | ICD-10-CM | POA: Diagnosis not present

## 2019-09-12 DIAGNOSIS — M0609 Rheumatoid arthritis without rheumatoid factor, multiple sites: Secondary | ICD-10-CM | POA: Diagnosis not present

## 2019-09-12 DIAGNOSIS — Z79899 Other long term (current) drug therapy: Secondary | ICD-10-CM | POA: Diagnosis not present

## 2019-09-12 DIAGNOSIS — M549 Dorsalgia, unspecified: Secondary | ICD-10-CM | POA: Diagnosis not present

## 2019-09-12 DIAGNOSIS — M25539 Pain in unspecified wrist: Secondary | ICD-10-CM | POA: Diagnosis not present

## 2019-09-12 DIAGNOSIS — M797 Fibromyalgia: Secondary | ICD-10-CM | POA: Diagnosis not present

## 2019-09-13 DIAGNOSIS — Z79899 Other long term (current) drug therapy: Secondary | ICD-10-CM | POA: Diagnosis not present

## 2019-09-13 DIAGNOSIS — M47816 Spondylosis without myelopathy or radiculopathy, lumbar region: Secondary | ICD-10-CM | POA: Diagnosis not present

## 2019-09-13 DIAGNOSIS — G894 Chronic pain syndrome: Secondary | ICD-10-CM | POA: Diagnosis not present

## 2019-09-13 DIAGNOSIS — Z79891 Long term (current) use of opiate analgesic: Secondary | ICD-10-CM | POA: Diagnosis not present

## 2019-09-13 DIAGNOSIS — M545 Low back pain: Secondary | ICD-10-CM | POA: Diagnosis not present

## 2019-09-13 DIAGNOSIS — M25559 Pain in unspecified hip: Secondary | ICD-10-CM | POA: Diagnosis not present

## 2019-09-14 DIAGNOSIS — R269 Unspecified abnormalities of gait and mobility: Secondary | ICD-10-CM | POA: Diagnosis not present

## 2019-09-14 DIAGNOSIS — M79604 Pain in right leg: Secondary | ICD-10-CM | POA: Diagnosis not present

## 2019-09-14 DIAGNOSIS — M25551 Pain in right hip: Secondary | ICD-10-CM | POA: Diagnosis not present

## 2019-09-14 DIAGNOSIS — M545 Low back pain: Secondary | ICD-10-CM | POA: Diagnosis not present

## 2019-09-15 ENCOUNTER — Other Ambulatory Visit: Payer: Self-pay | Admitting: Physician Assistant

## 2019-09-15 DIAGNOSIS — J45909 Unspecified asthma, uncomplicated: Secondary | ICD-10-CM

## 2019-09-17 DIAGNOSIS — M545 Low back pain: Secondary | ICD-10-CM | POA: Diagnosis not present

## 2019-09-17 DIAGNOSIS — M79604 Pain in right leg: Secondary | ICD-10-CM | POA: Diagnosis not present

## 2019-09-17 DIAGNOSIS — M25551 Pain in right hip: Secondary | ICD-10-CM | POA: Diagnosis not present

## 2019-09-17 DIAGNOSIS — R269 Unspecified abnormalities of gait and mobility: Secondary | ICD-10-CM | POA: Diagnosis not present

## 2019-09-25 DIAGNOSIS — M79604 Pain in right leg: Secondary | ICD-10-CM | POA: Diagnosis not present

## 2019-09-25 DIAGNOSIS — R269 Unspecified abnormalities of gait and mobility: Secondary | ICD-10-CM | POA: Diagnosis not present

## 2019-09-25 DIAGNOSIS — M25551 Pain in right hip: Secondary | ICD-10-CM | POA: Diagnosis not present

## 2019-09-25 DIAGNOSIS — M545 Low back pain: Secondary | ICD-10-CM | POA: Diagnosis not present

## 2019-09-27 DIAGNOSIS — M25559 Pain in unspecified hip: Secondary | ICD-10-CM | POA: Diagnosis not present

## 2019-10-01 DIAGNOSIS — M25551 Pain in right hip: Secondary | ICD-10-CM | POA: Diagnosis not present

## 2019-10-01 DIAGNOSIS — M545 Low back pain: Secondary | ICD-10-CM | POA: Diagnosis not present

## 2019-10-01 DIAGNOSIS — R269 Unspecified abnormalities of gait and mobility: Secondary | ICD-10-CM | POA: Diagnosis not present

## 2019-10-01 DIAGNOSIS — M79604 Pain in right leg: Secondary | ICD-10-CM | POA: Diagnosis not present

## 2019-10-04 DIAGNOSIS — M79604 Pain in right leg: Secondary | ICD-10-CM | POA: Diagnosis not present

## 2019-10-04 DIAGNOSIS — M545 Low back pain: Secondary | ICD-10-CM | POA: Diagnosis not present

## 2019-10-04 DIAGNOSIS — R269 Unspecified abnormalities of gait and mobility: Secondary | ICD-10-CM | POA: Diagnosis not present

## 2019-10-04 DIAGNOSIS — M25551 Pain in right hip: Secondary | ICD-10-CM | POA: Diagnosis not present

## 2019-10-10 NOTE — Patient Instructions (Signed)

## 2019-10-10 NOTE — Progress Notes (Signed)
This note is not being shared with the patient for the following reason: To prevent harm (release of this note would result in harm to the life or physical safety of the patient or another).       History of Present Illness:      This very nice 59 y.o. MWF presents for 6 month follow up with HTN, HLD, PreDM / Insulin Resistance  and Vitamin D Deficiency. Patient has Rheumatoid Arthritis  Predating since 2009 and she is followed by Dr Dossie Der.       Patient is treated for HTN (1980) & BP has been controlled at home. Today's BP is at goal - 122/74. Patient has had no complaints of any cardiac type chest pain, palpitations, dyspnea / orthopnea / PND, dizziness, claudication, or dependent edema.      Hyperlipidemia is not controlled with diet & rosuvastatin. Patient denies myalgias or other med SE's. Last Lipids were not at goal:  Lab Results  Component Value Date   CHOL 275 (H) 04/04/2019   HDL 63 04/04/2019   LDLCALC 187 (H) 04/04/2019   TRIG 119 04/04/2019   CHOLHDL 4.4 04/04/2019        Also, the patient has Morbid Obesity (BMI 37+) and consequent PreDiabetes (A1c 5.9% / 2010).  In June 2019 she was dx'd with Insulin Resistance and started on Metformin by Dr Suzette Battiest.  Patient  has had no symptoms of reactive hypoglycemia, diabetic polys, paresthesias or visual blurring.  Last A1c was Normal & at goal:  Lab Results  Component Value Date   HGBA1C 5.4 04/04/2019       Further, the patient also has history of Vitamin D Deficiency ("27" / 2009 & "37" / 2016)  and supplements vitamin D without any suspected side-effects. Last vitamin D was Normal & at goal;  Lab Results  Component Value Date   VD25OH 72 04/04/2019    Current Outpatient Medications on File Prior to Visit  Medication Sig  . Abatacept 125 MG/ML SOSY Inject 1 Syringe into the skin every 7 (seven) days.  Marland Kitchen acetaminophen (TYLENOL) 500 MG tablet Take 1 tablet (500 mg total) by mouth every 6 (six) hours as needed.  .  Acetylcysteine (N-ACETYL-L-CYSTEINE PO) Take 1 capsule by mouth daily.  Marland Kitchen albuterol (PROVENTIL) (2.5 MG/3ML) 0.083% nebulizer solution Take 3 mLs (2.5 mg total) by nebulization every 6 (six) hours as needed for wheezing or shortness of breath.  Marland Kitchen albuterol (VENTOLIN HFA) 108 (90 Base) MCG/ACT inhaler Use 2 inhalations 15 minutes apart every 4 hours as needed to rescue Asthma  . amitriptyline (ELAVIL) 25 MG tablet Take 1 tablet every Morning & 3 tablets at Bedtime for Chronic Pain  . ASPIRIN 81 PO Take by mouth daily.  . buprenorphine (BUTRANS) 10 MCG/HR PTWK patch Place 15 mcg onto the skin once a week.   . Cholecalciferol (VITAMIN D3) 5000 UNITS TABS Take 10,000 Units by mouth daily.   Marland Kitchen CRANBERRY PO Take by mouth daily.  . diclofenac Sodium (VOLTAREN) 1 % GEL diclofenac 1 % topical gel  APP 2 GRAMS TOPICALLY AA TWO TO TID  . doxycycline (VIBRAMYCIN) 50 MG capsule with lunch.  . Eyelid Cleansers (AVENOVA) 0.01 % SOLN Place 1 application into both eyes 2 (two) times daily. Apply to eyelids BID  . FEMRING 0.1 MG/24HR RING INSERT 1 RING VAGINALLY EVERY 3 MONTHS  . fexofenadine (ALLEGRA) 180 MG tablet Take 180 mg by mouth daily.  . fluticasone furoate-vilanterol (BREO ELLIPTA) 200-25 MCG/INH AEPB Inhale 1  puff into the lungs daily. Use 1 inhalation daily. Rinse mouth afterwards.  . furosemide (LASIX) 40 MG tablet TAKE 1 TABLET BY MOUTH THREE TIMES DAILY FOR FLUID RETENTION  . L-GLUTAMINE PO Take 1 capsule by mouth daily.  Marland Kitchen l-methylfolate-B6-B12 (METANX) 3-35-2 MG TABS tablet Take 1 tablet by mouth daily.  . Levomefolate Glucosamine (METHYLFOLATE PO) Take 5,000 mg by mouth daily.  Marland Kitchen liothyronine (CYTOMEL) 5 MCG tablet Take 1 tablet daily on an empty stomach with only water for 30 minutes & no Antacid meds, Calcium or Magnesium for 4 hours & avoid Biotin  . LOTEMAX 0.5 % GEL Place 1 drop into both eyes 3 (three) times daily.  . Magnesium Oxide 500 MG TABS Take 500 mg by mouth daily.  .  Menaquinone-7 (VITAMIN K2) 100 MCG CAPS Take 1 capsule by mouth daily.  . metFORMIN (GLUCOPHAGE-XR) 500 MG 24 hr tablet Take 1 tablet by mouth after dinner  . MILK THISTLE EXTRACT PO Take 2 capsules by mouth daily.  . montelukast (SINGULAIR) 10 MG tablet Take 1 tablet Daily for Allergies  . mupirocin ointment (BACTROBAN) 2 % APP EXT AA TID FOR 10 DAYS  . Omega-3 Fatty Acids (FISH OIL) 1200 MG CAPS Take 2 capsules by mouth daily.   Vladimir Faster Glycol-Propyl Glycol (SYSTANE) 0.4-0.3 % SOLN Place 1 drop into both eyes 4 (four) times daily.   . RESTASIS 0.05 % ophthalmic emulsion Place 1 drop into both eyes daily.   . rosuvastatin (CRESTOR) 40 MG tablet Take 1 tablet Daily for Cholesterol  . SYNTHROID 137 MCG tablet Take 137 mcg by mouth every morning.  . triamcinolone (NASACORT) 55 MCG/ACT AERO nasal inhaler 1 spray by Both Nostrils route daily.  . TURMERIC CURCUMIN PO Take 900 mg elemental calcium/kg/hr by mouth 2 (two) times daily.   No current facility-administered medications on file prior to visit.    Allergies  Allergen Reactions  . Ciprofloxacin Hives and Swelling    SWELLING REACTION UNSPECIFIED   . Levaquin [Levofloxacin] Hives  . Lyrica [Pregabalin] Other (See Comments)    Altered mental status ALTERED MENTAL STATUS HALLUCINATIONS  . Compazine [Prochlorperazine Edisylate]     UNSPECIFIED REACTION   . Hydrocodone Itching  . Morphine And Related Itching  . Penicillins Itching  . Sulfa Antibiotics Itching    PMHx:   Past Medical History:  Diagnosis Date  . Arthritis    OA, RA  . Asthma    with a cold  . Autoimmune deficiency syndrome ([place])   . Back pain   . Cancer ([place])    thyroid-1995  . Family history of adverse reaction to anesthesia    mother problems with n/v  . Fibromyalgia   . GERD (gastroesophageal reflux disease)   . Hepatitis    'A" when pt. was 12 yrs. old  . Hiatal hernia   . Hyperlipidemia   . Hypertension   . Hypothyroidism   . Pneumonia     Immunization History  Administered Date(s) Administered  . DTaP 09/15/2012, 09/15/2012  . Influenza Inj Mdck Quad With Preservative 08/02/2017  . Influenza Whole 09/06/2012  . Influenza-Unspecified 05/07/2014, 05/20/2016  . Pneumococcal Conjugate-13 09/15/2012, 07/05/2014  . Zoster 09/03/2013   Past Surgical History:  Procedure Laterality Date  . ANTERIOR CERVICAL DECOMP/DISCECTOMY FUSION N/A 05/30/2017   Procedure: Cervical five-six, Cervical six-seven Anterior discectomy with fusion and plate fixation;  Surgeon: Ditty, Kevan Ny, MD;  Location: [facility];  Service: Neurosurgery;  Laterality: N/A;  . ANTERIOR LAT LUMBAR FUSION Left  01/26/2013   Procedure: ANTERIOR LATERAL LUMBAR FUSION 1 LEVEL;  Surgeon: Faythe Ghee, MD;  Location: [facility];  Service: Neurosurgery;  Laterality: Left;  lumbar four-five  . KNEE ARTHROPLASTY Left 02/16/2016   Procedure: COMPUTER ASSISTED TOTAL KNEE ARTHROPLASTY;  Surgeon: Dereck Leep, MD;  Location: [facility];  Service: Orthopedics;  Laterality: Left;  . KNEE SURGERY     3 on right knee, 1 on left-prior to TKR  . LUMBAR PERCUTANEOUS PEDICLE SCREW 1 LEVEL Left 01/26/2013   Procedure: LUMBAR PERCUTANEOUS PEDICLE SCREW 1 LEVEL;  Surgeon: Faythe Ghee, MD;  Location: [facility];  Service: Neurosurgery;  Laterality: Left;  lumbar four-five  . PILONIDAL CYST EXCISION    . THYROIDECTOMY    . TONSILLECTOMY    . TOTAL KNEE ARTHROPLASTY     right  . UPPER GASTROINTESTINAL ENDOSCOPY    . VAGINAL HYSTERECTOMY      FHx:    Reviewed / unchanged  SHx:    Reviewed / unchanged   Systems Review:  Constitutional: Denies fever, chills, wt changes, headaches, insomnia, fatigue, night sweats, change in appetite. Eyes: Denies redness, blurred vision, diplopia, discharge, itchy, watery eyes.  ENT: Denies discharge, congestion, post nasal drip, epistaxis, sore throat, earache, hearing loss, dental pain, tinnitus, vertigo, sinus pain, snoring.  CV: Denies  chest pain, palpitations, irregular heartbeat, syncope, dyspnea, diaphoresis, orthopnea, PND, claudication or edema. Respiratory: denies cough, dyspnea, DOE, pleurisy, hoarseness, laryngitis, wheezing.  Gastrointestinal: Denies dysphagia, odynophagia, heartburn, reflux, water brash, abdominal pain or cramps, nausea, vomiting, bloating, diarrhea, constipation, hematemesis, melena, hematochezia  or hemorrhoids. Genitourinary: Denies dysuria, frequency, urgency, nocturia, hesitancy, discharge, hematuria or flank pain. Musculoskeletal: Denies arthralgias, myalgias, stiffness, jt. swelling, pain, limping or strain/sprain.  Skin: Denies pruritus, rash, hives, warts, acne, eczema or change in skin lesion(s). Neuro: No weakness, tremor, incoordination, spasms, paresthesia or pain. Psychiatric: Denies confusion, memory loss or sensory loss. Endo: Denies change in weight, skin or hair change.  Heme/Lymph: No excessive bleeding, bruising or enlarged lymph nodes.  Physical Exam  BP 122/74   Pulse 65   Temp 97.7 F (36.5 C)   Ht 5' 2.5" (1.588 m)   Wt 200 lb 6.4 oz (90.9 kg)   LMP  (LMP Unknown)   SpO2 98%   BMI 36.07 kg/m   Appears  well nourished, well groomed  and in no distress.  Eyes: PERRLA, EOMs, conjunctiva no swelling or erythema. Sinuses: No frontal/maxillary tenderness ENT/Mouth: EAC's clear, TM's nl w/o erythema, bulging. Nares clear w/o erythema, swelling, exudates. Oropharynx clear without erythema or exudates. Oral hygiene is good. Tongue normal, non obstructing. Hearing intact.  Neck: Supple. Thyroid not palpable. Car 2+/2+ without bruits, nodes or JVD. Chest: Respirations nl with BS clear & equal w/o rales, rhonchi, wheezing or stridor.  Cor: Heart sounds normal w/ regular rate and rhythm without sig. murmurs, gallops, clicks or rubs. Peripheral pulses normal and equal  without edema.  Abdomen: Soft & bowel sounds normal. Non-tender w/o guarding, rebound, hernias, masses or  organomegaly.  Lymphatics: Unremarkable.  Musculoskeletal: Full ROM all peripheral extremities, joint stability, 5/5 strength and normal gait.  Skin: Warm, dry without exposed rashes, lesions or ecchymosis apparent.  Neuro: Cranial nerves intact, reflexes equal bilaterally. Sensory-motor testing grossly intact. Tendon reflexes grossly intact.  Pysch: Alert & oriented x 3.  Insight and judgement nl & appropriate. No ideations.  Assessment and Plan:  1. Essential hypertension  - Continue medication, monitor blood pressure at home.  - Continue DASH  diet.  Reminder to go to the ER if any CP,  SOB, nausea, dizziness, severe HA, changes vision/speech.  - CBC with Differential/Platelet - COMPLETE METABOLIC PANEL WITH GFR - Magnesium - TSH  2. Hyperlipidemia, mixed  - Continue diet/meds, exercise,& lifestyle modifications.  - Continue monitor periodic cholesterol/liver & renal functions   - Lipid panel - TSH  3. Insulin resistance  - Hemoglobin A1c - Insulin, random  4. Vitamin D deficiency  - Continue diet, exercise  - Lifestyle modifications.  - Monitor appropriate labs. - Continue supplementation.  - VITAMIN D 25 Hydroxy   5. Abnormal glucose  - Hemoglobin A1c - Insulin, random  6. Prediabetes  - Hemoglobin A1c - Insulin, random  7. Hypothyroidism,  - TSH  8. Rheumatoid arthritis involving multiple sites with  positive rheumatoid factor ([place])   9. Medication management  - CBC with Differential/Platelet - COMPLETE METABOLIC PANEL WITH GFR - Magnesium - Lipid panel - TSH - Hemoglobin A1c - VITAMIN D 25 Hydroxy - Insulin, random  10. Close exposure to COVID-19 virus  - SAR CoV2 Serology (COVID 19)AB(IGG)IA           Discussed  regular exercise, BP monitoring, weight control to achieve/maintain BMI less than 25 and discussed med and SE's. Recommended labs to assess and monitor clinical status with further disposition pending results of labs.  I  discussed the assessment and treatment plan with the patient. The patient was provided an opportunity to ask questions and all were answered. The patient agreed with the plan and demonstrated an understanding of the instructions.  I provided over 30 minutes of exam, counseling, chart review and  complex critical decision making.  Kirtland Bouchard, MD

## 2019-10-11 ENCOUNTER — Ambulatory Visit (INDEPENDENT_AMBULATORY_CARE_PROVIDER_SITE_OTHER): Payer: Medicare Other | Admitting: Internal Medicine

## 2019-10-11 ENCOUNTER — Encounter: Payer: Self-pay | Admitting: Internal Medicine

## 2019-10-11 ENCOUNTER — Ambulatory Visit: Payer: Medicare Other | Admitting: Internal Medicine

## 2019-10-11 ENCOUNTER — Other Ambulatory Visit: Payer: Self-pay

## 2019-10-11 VITALS — BP 122/74 | HR 65 | Temp 97.7°F | Ht 62.5 in | Wt 200.4 lb

## 2019-10-11 DIAGNOSIS — E559 Vitamin D deficiency, unspecified: Secondary | ICD-10-CM

## 2019-10-11 DIAGNOSIS — J441 Chronic obstructive pulmonary disease with (acute) exacerbation: Secondary | ICD-10-CM

## 2019-10-11 DIAGNOSIS — R7309 Other abnormal glucose: Secondary | ICD-10-CM

## 2019-10-11 DIAGNOSIS — E782 Mixed hyperlipidemia: Secondary | ICD-10-CM

## 2019-10-11 DIAGNOSIS — E039 Hypothyroidism, unspecified: Secondary | ICD-10-CM | POA: Diagnosis not present

## 2019-10-11 DIAGNOSIS — E8881 Metabolic syndrome: Secondary | ICD-10-CM

## 2019-10-11 DIAGNOSIS — M0579 Rheumatoid arthritis with rheumatoid factor of multiple sites without organ or systems involvement: Secondary | ICD-10-CM

## 2019-10-11 DIAGNOSIS — Z79899 Other long term (current) drug therapy: Secondary | ICD-10-CM

## 2019-10-11 DIAGNOSIS — Z20822 Contact with and (suspected) exposure to covid-19: Secondary | ICD-10-CM

## 2019-10-11 DIAGNOSIS — R7303 Prediabetes: Secondary | ICD-10-CM | POA: Diagnosis not present

## 2019-10-11 DIAGNOSIS — I10 Essential (primary) hypertension: Secondary | ICD-10-CM

## 2019-10-11 MED ORDER — MUPIROCIN 2 % EX OINT: TOPICAL_OINTMENT | CUTANEOUS | 3 refills | Status: DC

## 2019-10-12 ENCOUNTER — Other Ambulatory Visit: Payer: Self-pay | Admitting: Internal Medicine

## 2019-10-12 ENCOUNTER — Encounter: Payer: Self-pay | Admitting: Internal Medicine

## 2019-10-12 DIAGNOSIS — Z8616 Personal history of COVID-19: Secondary | ICD-10-CM | POA: Insufficient documentation

## 2019-10-12 LAB — CBC WITH DIFFERENTIAL/PLATELET
Absolute Monocytes: 371 cells/uL (ref 200–950)
Basophils Absolute: 21 cells/uL (ref 0–200)
Basophils Relative: 0.3 %
Eosinophils Absolute: 7 cells/uL — ABNORMAL LOW (ref 15–500)
Eosinophils Relative: 0.1 %
HCT: 43.6 % (ref 35.0–45.0)
Hemoglobin: 14.2 g/dL (ref 11.7–15.5)
Lymphs Abs: 2849 cells/uL (ref 850–3900)
MCH: 28.2 pg (ref 27.0–33.0)
MCHC: 32.6 g/dL (ref 32.0–36.0)
MCV: 86.7 fL (ref 80.0–100.0)
MPV: 10 fL (ref 7.5–12.5)
Monocytes Relative: 5.3 %
Neutro Abs: 3752 cells/uL (ref 1500–7800)
Neutrophils Relative %: 53.6 %
Platelets: 380 10*3/uL (ref 140–400)
RBC: 5.03 10*6/uL (ref 3.80–5.10)
RDW: 13.5 % (ref 11.0–15.0)
Total Lymphocyte: 40.7 %
WBC: 7 10*3/uL (ref 3.8–10.8)

## 2019-10-12 LAB — TSH: TSH: 0.33 mIU/L — ABNORMAL LOW (ref 0.40–4.50)

## 2019-10-12 LAB — HEMOGLOBIN A1C
Hgb A1c MFr Bld: 5.5 % of total Hgb (ref <5.7)
Mean Plasma Glucose: 111 (calc)
eAG (mmol/L): 6.2 (calc)

## 2019-10-12 LAB — COMPLETE METABOLIC PANEL WITH GFR
AG Ratio: 1.6 (calc) (ref 1.0–2.5)
ALT: 16 U/L (ref 6–29)
AST: 17 U/L (ref 10–35)
Albumin: 4.3 g/dL (ref 3.6–5.1)
Alkaline phosphatase (APISO): 67 U/L (ref 37–153)
BUN: 17 mg/dL (ref 7–25)
CO2: 27 mmol/L (ref 20–32)
Calcium: 9.6 mg/dL (ref 8.6–10.4)
Chloride: 101 mmol/L (ref 98–110)
Creat: 0.62 mg/dL (ref 0.50–1.05)
GFR, Est African American: 115 mL/min/{1.73_m2} (ref >=60)
GFR, Est Non African American: 99 mL/min/{1.73_m2} (ref >=60)
Globulin: 2.7 g/dL (calc) (ref 1.9–3.7)
Glucose, Bld: 90 mg/dL (ref 65–99)
Potassium: 4.7 mmol/L (ref 3.5–5.3)
Sodium: 137 mmol/L (ref 135–146)
Total Bilirubin: 0.4 mg/dL (ref 0.2–1.2)
Total Protein: 7 g/dL (ref 6.1–8.1)

## 2019-10-12 LAB — LIPID PANEL
Cholesterol: 213 mg/dL — ABNORMAL HIGH (ref <200)
HDL: 82 mg/dL (ref >=50)
LDL Cholesterol (Calc): 116 mg/dL (calc) — ABNORMAL HIGH
Non-HDL Cholesterol (Calc): 131 mg/dL (calc) — ABNORMAL HIGH (ref <130)
Total CHOL/HDL Ratio: 2.6 (calc) (ref <5.0)
Triglycerides: 54 mg/dL (ref <150)

## 2019-10-12 LAB — MAGNESIUM: Magnesium: 1.9 mg/dL (ref 1.5–2.5)

## 2019-10-12 LAB — INSULIN, RANDOM: Insulin: 5.4 u[IU]/mL

## 2019-10-12 LAB — VITAMIN D 25 HYDROXY (VIT D DEFICIENCY, FRACTURES): Vit D, 25-Hydroxy: 63 ng/mL (ref 30–100)

## 2019-10-12 LAB — SAR COV2 SEROLOGY (COVID19)AB(IGG),IA: SARS CoV2 AB IGG: POSITIVE — AB

## 2019-10-12 MED ORDER — EZETIMIBE 10 MG PO TABS: ORAL_TABLET | ORAL | 1 refills | Status: DC

## 2019-10-13 DIAGNOSIS — J441 Chronic obstructive pulmonary disease with (acute) exacerbation: Secondary | ICD-10-CM | POA: Insufficient documentation

## 2019-10-16 ENCOUNTER — Encounter (INDEPENDENT_AMBULATORY_CARE_PROVIDER_SITE_OTHER): Payer: Self-pay

## 2019-10-17 ENCOUNTER — Encounter (INDEPENDENT_AMBULATORY_CARE_PROVIDER_SITE_OTHER): Payer: Self-pay

## 2019-10-22 ENCOUNTER — Encounter (INDEPENDENT_AMBULATORY_CARE_PROVIDER_SITE_OTHER): Payer: Self-pay

## 2019-10-24 ENCOUNTER — Encounter (INDEPENDENT_AMBULATORY_CARE_PROVIDER_SITE_OTHER): Payer: Self-pay

## 2019-10-26 ENCOUNTER — Encounter (INDEPENDENT_AMBULATORY_CARE_PROVIDER_SITE_OTHER): Payer: Self-pay

## 2019-11-08 DIAGNOSIS — M25539 Pain in unspecified wrist: Secondary | ICD-10-CM | POA: Diagnosis not present

## 2019-11-08 DIAGNOSIS — M25559 Pain in unspecified hip: Secondary | ICD-10-CM | POA: Diagnosis not present

## 2019-11-08 DIAGNOSIS — M47816 Spondylosis without myelopathy or radiculopathy, lumbar region: Secondary | ICD-10-CM | POA: Diagnosis not present

## 2019-11-08 DIAGNOSIS — G894 Chronic pain syndrome: Secondary | ICD-10-CM | POA: Diagnosis not present

## 2019-11-11 ENCOUNTER — Other Ambulatory Visit: Payer: Self-pay | Admitting: Adult Health

## 2019-12-07 ENCOUNTER — Other Ambulatory Visit: Payer: Self-pay | Admitting: Internal Medicine

## 2019-12-07 MED ORDER — NITROFURANTOIN MONOHYD MACRO 100 MG PO CAPS: ORAL_CAPSULE | ORAL | 0 refills | Status: DC

## 2019-12-27 ENCOUNTER — Ambulatory Visit
Admission: RE | Admit: 2019-12-27 | Discharge: 2019-12-27 | Disposition: A | Discharge status: Home / Self Care | Payer: Medicare Other | Source: Ambulatory Visit | Attending: Physician Assistant | Admitting: Physician Assistant

## 2019-12-27 ENCOUNTER — Other Ambulatory Visit: Payer: Self-pay

## 2019-12-27 ENCOUNTER — Other Ambulatory Visit: Payer: Self-pay | Admitting: Physician Assistant

## 2019-12-27 DIAGNOSIS — G894 Chronic pain syndrome: Secondary | ICD-10-CM | POA: Diagnosis not present

## 2019-12-27 DIAGNOSIS — M25531 Pain in right wrist: Secondary | ICD-10-CM

## 2019-12-27 DIAGNOSIS — M25559 Pain in unspecified hip: Secondary | ICD-10-CM | POA: Diagnosis not present

## 2019-12-27 DIAGNOSIS — M069 Rheumatoid arthritis, unspecified: Secondary | ICD-10-CM

## 2019-12-27 DIAGNOSIS — Z79891 Long term (current) use of opiate analgesic: Secondary | ICD-10-CM | POA: Diagnosis not present

## 2019-12-27 DIAGNOSIS — Z79899 Other long term (current) drug therapy: Secondary | ICD-10-CM | POA: Diagnosis not present

## 2019-12-27 DIAGNOSIS — M25539 Pain in unspecified wrist: Secondary | ICD-10-CM | POA: Diagnosis not present

## 2019-12-27 DIAGNOSIS — M47816 Spondylosis without myelopathy or radiculopathy, lumbar region: Secondary | ICD-10-CM | POA: Diagnosis not present

## 2020-01-01 ENCOUNTER — Ambulatory Visit (INDEPENDENT_AMBULATORY_CARE_PROVIDER_SITE_OTHER): Payer: Medicare Other

## 2020-01-01 ENCOUNTER — Other Ambulatory Visit: Payer: Self-pay | Admitting: Internal Medicine

## 2020-01-01 ENCOUNTER — Other Ambulatory Visit: Payer: Self-pay

## 2020-01-01 DIAGNOSIS — R300 Dysuria: Secondary | ICD-10-CM | POA: Diagnosis not present

## 2020-01-01 NOTE — Progress Notes (Signed)
Patient presents to the office for a nurse visit. Patient was recently treated for a UTI and is here to provide a urine specimen. Orders in and vitals taken and recorded.

## 2020-01-02 ENCOUNTER — Other Ambulatory Visit: Payer: Self-pay | Admitting: Internal Medicine

## 2020-01-02 DIAGNOSIS — R110 Nausea: Secondary | ICD-10-CM

## 2020-01-02 LAB — URINALYSIS, ROUTINE W REFLEX MICROSCOPIC
Bacteria, UA: NONE SEEN /HPF
Bilirubin Urine: NEGATIVE
Glucose, UA: NEGATIVE
Hyaline Cast: NONE SEEN /LPF
Ketones, ur: NEGATIVE
Leukocytes,Ua: NEGATIVE
Nitrite: NEGATIVE
Specific Gravity, Urine: 1.033 (ref 1.001–1.03)
Squamous Epithelial / HPF: NONE SEEN /HPF (ref <=5)
WBC, UA: NONE SEEN /HPF (ref 0–5)
pH: <=5 (ref 5.0–8.0)

## 2020-01-02 LAB — URINE CULTURE
MICRO NUMBER:: 10411450
SPECIMEN QUALITY:: ADEQUATE

## 2020-01-02 MED ORDER — ONDANSETRON HCL 8 MG PO TABS: ORAL_TABLET | ORAL | 0 refills | Status: DC

## 2020-01-08 ENCOUNTER — Ambulatory Visit: Payer: Medicare Other | Admitting: Adult Health

## 2020-01-08 DIAGNOSIS — I700 Atherosclerosis of aorta: Secondary | ICD-10-CM | POA: Insufficient documentation

## 2020-01-08 NOTE — Progress Notes (Signed)
MEDICARE ANNUAL WELLNESS VISIT AND FOLLOW UP  Assessment:    Diagnoses and all orders for this visit:  Encounter for Medicare annual wellness exam  Atherosclerosis of aorta Per CT 06/2018 Control blood pressure, cholesterol, glucose, increase exercise.   Essential hypertension Continue medication Monitor blood pressure at home; call if consistently over 130/80 Continue DASH diet.   Reminder to go to the ER if any CP, SOB, nausea, dizziness, severe HA, changes vision/speech, left arm numbness and tingling and jaw pain.  Intrinsic asthma Stable on inhalers/meds  Sjogren's syndrome, with unspecified organ involvement ([place]) On DMARD, followed by rheumatology  Postoperative hypothyroidism continue medications the same pending lab results reminded to take on an empty stomach 30-76mins before food.  check TSH level  Cervical spondylosis with radiculopathy Currently fairly managed; followed by Dr. Dossie Der and Dr. Cyndy Freeze  Rheumatoid arthritis involving multiple sites, unspecified rheumatoid factor presence ([place]) Managed by Dr. Dossie Der, on DMARD  Degeneration of intervertebral disc of lumbosacral region Continue NSAID PRN, follows rheum  Vitamin D deficiency At goal at recent check; continue to recommend supplementation for goal of 70-100 Defer vitamin D level  Prediabetes Controlled on metformin for hx of severe fasting glucose elevation Discussed disease and risks Discussed diet/exercise, weight management  A1C q91m; defer  Morbid obesity ([place])  Long discussion about weight loss, diet, and exercise Recommended diet heavy in fruits and veggies and low in animal meats, cheeses, and dairy products, appropriate calorie intake Discussed appropriate weight for height and initial goal (185lb) Follow up at next visit  Mixed hyperlipidemia Continue medications; rosuvastatin 20 mg daily, zeita 10 mg daily (new) - never tried 40 mg rosuvastatin; consider trial of rosuvastatin 40 and  hold zetia until next visit  Continue low cholesterol diet and exercise.  Check lipid panel.   Medication management CBC, CMP/GFR, magnesium at routine visits  Hidradenitis suppurativa Doxycycline, endo follows  Former smoker 30 pack year, quit 2015 -lung cancer screening with low dose CT discussed as recommended by guidelines based on age, number of pack year history.  Discussed risks of screening including but not limited to false positives on xray, further testing or consultation with specialist, and possible false negative CT as well. Understanding expressed and wishes to proceed with CT testing. Order placed.    Over 40 minutes of exam, counseling, chart review and critical decision making was performed Future Appointments  Date Time Provider [place]  05/14/2020 10:00 AM Unk Pinto, MD [facility] None     Plan:   During the course of the visit the patient was educated and counseled about appropriate screening and preventive services including:    Pneumococcal vaccine   Prevnar 13  Influenza vaccine  Td vaccine  Screening electrocardiogram  Bone densitometry screening  Colorectal cancer screening  Diabetes screening  Glaucoma screening  Nutrition counseling   Advanced directives: requested   Subjective:  Autumn Frye is a 59 y.o. female who presents for Medicare Annual Wellness Visit and 3 month follow up.  Patient is followed by Dr Dossie Der  for Rheumatoid Arthritis and Sjogren's since 2009. She is also on orencia/abatacept. Chronic neck/lumbar pain, has spinal stimulator and on buprenorphine patches via Dr. Vira Blanco.   Dr. Chalmers Cater with endocrinology following for hypothyroid and hidradenitis suppurativa. She has been on doxycycline 50 mg daily for many years as prophylaxis for this issue.  Asthma is controlled by singulaire daily, BREO PRN if has a cold. No recent flares.   BMI is Body mass index is 36.[place]  kg/m., she has been working on diet  and exercise, going to the [employer] (water aerobics with water weights) 3 days a week, reports scale hasn't moved but has noted pant size going down would like to get down to 140 lb or so, cutting out starch and sugar.  Has been following with Dr. Gaylord Shih "Adapt your life" program online via [facility], modified keto type diet 3 week plan, with greens, avocado, cruciferous veggies, plans to start this week.  Wt Readings from Last 3 Encounters:  01/09/20 204 lb (92.5 kg)  01/01/20 201 lb (91.2 kg)  10/11/19 200 lb 6.4 oz (90.9 kg)   Former smoker, 30 pack year history, quit in 2015, will start low dose CT scans.  She has aortic atherosclerosis per abd CT 06/2018.  Her blood pressure has been controlled at home, today their BP is BP: 128/82 She does workout. She denies chest pain, shortness of breath, dizziness.   She is on cholesterol medication (rosuvastatin 20 mg daily, zetia 10 mg daily since last visit) and denies myalgias. Her LDL cholesterol is not at goal. The cholesterol last visit was:   Lab Results  Component Value Date   CHOL 213 (H) 10/11/2019   HDL 82 10/11/2019   LDLCALC 116 (H) 10/11/2019   TRIG 54 10/11/2019   CHOLHDL 2.6 10/11/2019    She has been working on diet and exercise for prediabetes which was controlled on metformin which recently stopped taking, and denies foot ulcerations, increased appetite, nausea, paresthesia of the feet, polydipsia, polyuria and visual disturbances. Last A1C in the office was:  Lab Results  Component Value Date   HGBA1C 5.5 10/11/2019   She is on thyroid medication. Her medication was not changed last visit, taking 137 mcg synthroid daily, recently stopped taking cytomel.  Lab Results  Component Value Date   TSH 0.33 (L) 10/11/2019   Last GFR: Lab Results  Component Value Date   GFRNONAA 99 10/11/2019   Patient is on Vitamin D supplement.   Lab Results  Component Value Date   VD25OH 63 10/11/2019      Medication Review: Current  Outpatient Medications on File Prior to Visit  Medication Sig Dispense Refill  . Abatacept 125 MG/ML SOSY Inject 1 Syringe into the skin every 7 (seven) days.    Marland Kitchen acetaminophen (TYLENOL) 500 MG tablet Take 1 tablet (500 mg total) by mouth every 6 (six) hours as needed. 30 tablet 0  . Acetylcysteine (N-ACETYL-L-CYSTEINE PO) Take 1 capsule by mouth daily.    Marland Kitchen albuterol (PROVENTIL) (2.5 MG/3ML) 0.083% nebulizer solution Take 3 mLs (2.5 mg total) by nebulization every 6 (six) hours as needed for wheezing or shortness of breath. 60 vial 1  . albuterol (VENTOLIN HFA) 108 (90 Base) MCG/ACT inhaler Use 2 inhalations 15 minutes apart every 4 hours as needed to rescue Asthma 48 g 3  . amitriptyline (ELAVIL) 25 MG tablet Take 1 tablet every Morning & 3 tablets at Bedtime for Chronic Pain 360 tablet 3  . ASPIRIN 81 PO Take by mouth daily.    . buprenorphine (BUTRANS) 10 MCG/HR PTWK patch Place 15 mcg onto the skin once a week.     . Cholecalciferol (VITAMIN D3) 5000 UNITS TABS Take 10,000 Units by mouth daily.     Marland Kitchen CRANBERRY PO Take by mouth daily.    . diclofenac Sodium (VOLTAREN) 1 % GEL diclofenac 1 % topical gel  APP 2 GRAMS TOPICALLY AA TWO TO TID    . doxycycline (VIBRAMYCIN)  50 MG capsule with lunch.    . Eyelid Cleansers (AVENOVA) 0.01 % SOLN Place 1 application into both eyes 2 (two) times daily. Apply to eyelids BID 1 Bottle 1  . ezetimibe (ZETIA) 10 MG tablet Take 1 tablet Daily for Cholesterol 90 tablet 1  . FEMRING 0.1 MG/24HR RING INSERT 1 RING VAGINALLY EVERY 3 MONTHS 1 each 3  . fexofenadine (ALLEGRA) 180 MG tablet Take 180 mg by mouth daily.    . fluticasone furoate-vilanterol (BREO ELLIPTA) 200-25 MCG/INH AEPB Inhale 1 puff into the lungs daily. Use 1 inhalation daily. Rinse mouth afterwards. 60 each 99  . furosemide (LASIX) 40 MG tablet TAKE 1 TABLET BY MOUTH THREE TIMES DAILY FOR FLUID RETENTION 270 tablet 1  . L-GLUTAMINE PO Take 1 capsule by mouth daily.    Marland Kitchen l-methylfolate-B6-B12  (METANX) 3-35-2 MG TABS tablet Take 1 tablet by mouth daily.    . Levomefolate Glucosamine (METHYLFOLATE PO) Take 5,000 mg by mouth daily.    Marland Kitchen LOTEMAX 0.5 % GEL Place 1 drop into both eyes 3 (three) times daily. 1 Bottle 2  . Magnesium Oxide 500 MG TABS Take 500 mg by mouth daily.    . Menaquinone-7 (VITAMIN K2) 100 MCG CAPS Take 1 capsule by mouth daily.    Marland Kitchen MILK THISTLE EXTRACT PO Take 2 capsules by mouth daily.    . montelukast (SINGULAIR) 10 MG tablet Take 1 tablet Daily for Allergies 90 tablet 3  . mupirocin ointment (BACTROBAN) 2 % APP EXT AA TID FOR 10 DAYS 22 g 3  . Omega-3 Fatty Acids (FISH OIL) 1200 MG CAPS Take 2 capsules by mouth daily.     . ondansetron (ZOFRAN) 8 MG tablet Take 1/2 to 1 tablet 2 to 3 x /day as needed for Nausea 30 tablet 0  . Polyethyl Glycol-Propyl Glycol (SYSTANE) 0.4-0.3 % SOLN Place 1 drop into both eyes 4 (four) times daily.     . RESTASIS 0.05 % ophthalmic emulsion Place 1 drop into both eyes daily.   3  . rosuvastatin (CRESTOR) 40 MG tablet Take 1 tablet Daily for Cholesterol 90 tablet 3  . SYNTHROID 137 MCG tablet Take 137 mcg by mouth every morning.    . triamcinolone (NASACORT) 55 MCG/ACT AERO nasal inhaler 1 spray by Both Nostrils route daily.    . TURMERIC CURCUMIN PO Take 900 mg elemental calcium/kg/hr by mouth 2 (two) times daily.     No current facility-administered medications on file prior to visit.    Allergies  Allergen Reactions  . Ciprofloxacin Hives and Swelling    SWELLING REACTION UNSPECIFIED   . Levaquin [Levofloxacin] Hives  . Lyrica [Pregabalin] Other (See Comments)    Altered mental status ALTERED MENTAL STATUS HALLUCINATIONS  . Compazine [Prochlorperazine Edisylate]     UNSPECIFIED REACTION   . Hydrocodone Itching  . Morphine And Related Itching  . Penicillins Itching  . Sulfa Antibiotics Itching    Current Problems (verified) Patient Active Problem List   Diagnosis Date Noted  . Aortic atherosclerosis ([place])  01/08/2020  . History of COVID-19 (11 Oct 2019)  10/12/2019  . Hidradenitis suppurativa 05/16/2019  . Morbid obesity ([place]) 11/08/2017  . Abnormal glucose 08/02/2017  . Cervical spondylosis with radiculopathy 05/30/2017  . Rheumatoid arthritis involving multiple sites ([place]) 11/14/2015  . Encounter for Medicare annual wellness exam 04/24/2015  . Sjogren's disease ([place]) 07/05/2014  . Medication management 03/26/2014  . Mixed hyperlipidemia 09/24/2013  . Vitamin D deficiency 09/24/2013  . Intrinsic asthma   .  Hypertension   . Hypothyroidism   . Degeneration of intervertebral disc of lumbosacral region 05/23/2008    Screening Tests Immunization History  Administered Date(s) Administered  . DTaP 09/15/2012, 09/15/2012  . Influenza Inj Mdck Quad With Preservative 08/02/2017  . Influenza Whole 09/06/2012  . Influenza-Unspecified 05/07/2014, 05/20/2016  . Pneumococcal Conjugate-13 09/15/2012, 07/05/2014  . Zoster 09/03/2013    Colonoscopy: 2013 - 10 year follow up MGM: 05/2019 Stress: 2004 DEXA: 2013 Echo: 2004 CXr: 02/2016 PAP: reports had in 01/2019 by GYN  Former smoker, 30 pack year hx, quit 2015 Ct chest: ordered today   Tdap: 2014 Flu vaccine: 2018 Covid 19: declines   All other vaccines UTD  Names of Other Physician/Practitioners you currently use: 1. [facility] here for primary care 2. [facility], eye doctor, last visit 2020 - mild cataracts 3. [facility], dentist, last visit 2020, goes q69m, due    Patient Care Team: Unk Pinto, MD as PCP - General (Internal Medicine) Ladene Artist, MD as Consulting Physician (Gastroenterology) Newt Minion, MD as Consulting Physician (Orthopedic Surgery) Unice Bailey, MD as Consulting Physician (Rheumatology) Karie Chimera, MD as Consulting Physician (Neurosurgery) Druscilla Brownie, MD as Consulting Physician (Dermatology) Druscilla Brownie, MD as Consulting  Physician (Dermatology)  SURGICAL HISTORY She  has a past surgical history that includes Thyroidectomy; Upper gastrointestinal endoscopy; Total knee arthroplasty; Vaginal hysterectomy; Knee surgery; Pilonidal cyst excision; Tonsillectomy; Anterior lat lumbar fusion (Left, 01/26/2013); Lumbar percutaneous pedicle screw 1 level (Left, 01/26/2013); Knee Arthroplasty (Left, 02/16/2016); and Anterior cervical decomp/discectomy fusion (N/A, 05/30/2017). FAMILY HISTORY Her family history includes Asthma in her brother; Breast cancer (age of onset: 47) in her maternal grandmother; Cancer in her maternal grandmother; Diabetes in her brother; Hypertension in her father; Other in her daughter; Stroke in her father. SOCIAL HISTORY She  reports that she quit smoking about 4 years ago. Her smoking use included cigarettes. She has a 20.00 pack-year smoking history. She has never used smokeless tobacco. She reports current alcohol use. She reports that she does not use drugs.   MEDICARE WELLNESS OBJECTIVES: Physical activity:   Cardiac risk factors:   Depression/mood screen:   Depression screen [facility] 2/9 04/03/2019  Decreased Interest 0  Down, Depressed, Hopeless 0  PHQ - 2 Score 0    ADLs:  In your present state of health, do you have any difficulty performing the following activities: 04/03/2019  Hearing? N  Vision? N  Difficulty concentrating or making decisions? N  Walking or climbing stairs? N  Dressing or bathing? N  Doing errands, shopping? N  Some recent data might be hidden     Cognitive Testing  Alert? Yes  Normal Appearance?Yes  Oriented to person? Yes  Place? Yes   Time? Yes  Recall of three objects?  Yes  Can perform simple calculations? Yes  Displays appropriate judgment?Yes  Can read the correct time from a watch face?Yes  EOL planning:    Review of Systems  Constitutional: Negative for malaise/fatigue and weight loss.  HENT: Negative for hearing loss and tinnitus.   Eyes: Negative  for blurred vision and double vision.  Respiratory: Negative for cough, sputum production, shortness of breath and wheezing.   Cardiovascular: Negative for chest pain, palpitations, orthopnea, claudication, leg swelling and PND.  Gastrointestinal: Negative for abdominal pain, blood in stool, constipation, diarrhea, heartburn, melena, nausea and vomiting.  Genitourinary: Negative.   Musculoskeletal: Positive for joint pain (wrists, follows rheumatology). Negative for falls and myalgias.  Skin: Negative  for rash.  Neurological: Negative for dizziness, tingling, sensory change, weakness and headaches.  Endo/Heme/Allergies: Negative for polydipsia.  Psychiatric/Behavioral: Negative.  Negative for depression, memory loss, substance abuse and suicidal ideas. The patient is not nervous/anxious and does not have insomnia.   All other systems reviewed and are negative.    Objective:     Today's Vitals   01/09/20 1415  BP: 128/82  Pulse: (!) 106  Temp: (!) 97 F (36.1 C)  SpO2: 98%  Weight: 204 lb (92.5 kg)  Height: 5' 2.5" (1.588 m)   Body mass index is 36.72 kg/m.  General Appearance: Well nourished, in no apparent distress. Eyes: PERRLA, EOMs, conjunctiva no swelling or erythema Sinuses: No Frontal/maxillary tenderness ENT/Mouth: Ext aud canals clear, TMs without erythema, bulging. No erythema, swelling, or exudate on post pharynx.  Tonsils not swollen or erythematous. Hearing normal.  Neck: Supple, thyroid normal.  Respiratory: Respiratory effort normal, BS equal bilaterally without rales, rhonchi, wheezing or stridor.  Cardio: RRR with no MRGs. Brisk peripheral pulses without edema.  Abdomen: Soft, + BS.  Non tender, no guarding, rebound, hernias, masses. Lymphatics: Non tender without lymphadenopathy.  Musculoskeletal: Full ROM, 5/5 strength, Mildly antalgic gait. R wrist with some warmth; non-erythematous, no obvious deformity Skin: Warm, dry; intact without rashes, lesions,  ecchynosis Neuro: Cranial nerves intact. No cerebellar symptoms.  Psych: Awake and oriented X 3, normal affect, Insight and Judgment appropriate.    Medicare Attestation I have personally reviewed: The patient's medical and social history Their use of alcohol, tobacco or illicit drugs Their current medications and supplements The patient's functional ability including ADLs,fall risks, home safety risks, cognitive, and hearing and visual impairment Diet and physical activities Evidence for depression or mood disorders  The patient's weight, height, BMI, and visual acuity have been recorded in the chart.  I have made referrals, counseling, and provided education to the patient based on review of the above and I have provided the patient with a written personalized care plan for preventive services.     Autumn Ribas, NP   01/09/2020

## 2020-01-09 ENCOUNTER — Ambulatory Visit (INDEPENDENT_AMBULATORY_CARE_PROVIDER_SITE_OTHER): Payer: Medicare Other | Admitting: Adult Health

## 2020-01-09 ENCOUNTER — Other Ambulatory Visit: Payer: Self-pay

## 2020-01-09 ENCOUNTER — Encounter: Payer: Self-pay | Admitting: Adult Health

## 2020-01-09 VITALS — BP 128/82 | HR 106 | Temp 97.0°F | Ht 62.5 in | Wt 204.0 lb

## 2020-01-09 DIAGNOSIS — R6889 Other general symptoms and signs: Secondary | ICD-10-CM

## 2020-01-09 DIAGNOSIS — Z0000 Encounter for general adult medical examination without abnormal findings: Secondary | ICD-10-CM

## 2020-01-09 DIAGNOSIS — M0579 Rheumatoid arthritis with rheumatoid factor of multiple sites without organ or systems involvement: Secondary | ICD-10-CM | POA: Diagnosis not present

## 2020-01-09 DIAGNOSIS — E890 Postprocedural hypothyroidism: Secondary | ICD-10-CM

## 2020-01-09 DIAGNOSIS — M3500 Sicca syndrome, unspecified: Secondary | ICD-10-CM

## 2020-01-09 DIAGNOSIS — L732 Hidradenitis suppurativa: Secondary | ICD-10-CM

## 2020-01-09 DIAGNOSIS — R7309 Other abnormal glucose: Secondary | ICD-10-CM | POA: Diagnosis not present

## 2020-01-09 DIAGNOSIS — J45909 Unspecified asthma, uncomplicated: Secondary | ICD-10-CM | POA: Diagnosis not present

## 2020-01-09 DIAGNOSIS — E782 Mixed hyperlipidemia: Secondary | ICD-10-CM | POA: Diagnosis not present

## 2020-01-09 DIAGNOSIS — E559 Vitamin D deficiency, unspecified: Secondary | ICD-10-CM

## 2020-01-09 DIAGNOSIS — I700 Atherosclerosis of aorta: Secondary | ICD-10-CM

## 2020-01-09 DIAGNOSIS — M4722 Other spondylosis with radiculopathy, cervical region: Secondary | ICD-10-CM

## 2020-01-09 DIAGNOSIS — Z0001 Encounter for general adult medical examination with abnormal findings: Secondary | ICD-10-CM | POA: Diagnosis not present

## 2020-01-09 DIAGNOSIS — Z87891 Personal history of nicotine dependence: Secondary | ICD-10-CM

## 2020-01-09 DIAGNOSIS — Z79899 Other long term (current) drug therapy: Secondary | ICD-10-CM

## 2020-01-09 DIAGNOSIS — M5137 Other intervertebral disc degeneration, lumbosacral region: Secondary | ICD-10-CM

## 2020-01-09 DIAGNOSIS — I10 Essential (primary) hypertension: Secondary | ICD-10-CM

## 2020-01-09 NOTE — Patient Instructions (Addendum)
Autumn Frye , Thank you for taking time to come for your Medicare Wellness Visit. I appreciate your ongoing commitment to your health goals. Please review the following plan we discussed and let me know if I can assist you in the future.   These are the goals we discussed: Goals    . Exercise 150 min/wk Moderate Activity    . LDL CALC < 130    . Weight (lb) < 185 lb (83.9 kg)       This is a list of the screening recommended for you and due dates:  Health Maintenance  Topic Date Due  . COVID-19 Vaccine (1) 01/25/2020*  . Urine Protein Check  04/03/2020  . Flu Shot  04/06/2020  . Mammogram  05/08/2020  . Colon Cancer Screening  10/12/2021  . Tetanus Vaccine  09/15/2022  .  Hepatitis C: One time screening is recommended by Center for Disease Control  (CDC) for  adults born from [birth year] through 1965.   Completed  . HIV Screening  Completed  . Pap Smear  Discontinued  *Topic was postponed. The date shown is not the original due date.     Lung Cancer Screening A lung cancer screening is a test that checks for lung cancer. Lung cancer screening is done to look for lung cancer in its very early stages, before it spreads and becomes harder to treat and before symptoms appear. Finding cancer early improves the chances of successful treatment. It may save your life. Should I be screened for lung cancer? You should be screened for lung cancer if all of these apply:  You currently smoke or you have quit smoking within the past 15 years.  You are 31-101 years old. Screening may be recommended up to age 23 depending on your overall health and other factors.  You are in good general health.  You have a smoking history of 1 pack a day for 30 years or 2 packs a day for 15 years. Screening may also be recommended if you are at high risk for the disease. You may be at high risk if:  You have a family history of lung cancer.  You have been exposed to asbestos.  You have chronic obstructive  pulmonary disease (COPD).  You have a history of previous lung cancer. How often should I be screened for lung cancer?  If you are at risk for lung cancer, it is recommended that you are screened once a year. The recommended screening test is a low-dose CT scan. How can I lower my risk of lung cancer? To lower your risk of developing lung cancer:  If you smoke, stop smoking all tobacco products.  Avoid secondhand smoke.  Avoid exposure to radiation.  Avoid exposure to radon gas. Have your home checked for radon regularly.  Avoid things that cause cancer (carcinogens).  Avoid living or working in places with high air pollution. Where to find more information Ask your health care provider about the risks and benefits of screening. More information and resources are available from these organizations:  [place] (ACS): www.cancer.org  American Lung Association: www.lung.org Contact a health care provider if:  You start to show symptoms of lung cancer, including: ? Coughing that will not go away. ? Wheezing. ? Chest pain. ? Coughing up blood. ? Shortness of breath. ? Weight loss that cannot be explained. ? Constant fatigue. Summary  Lung cancer screening may find lung cancer before symptoms appear. Finding cancer early improves the chances of  successful treatment. It may save your life.  If you are at risk for lung cancer, it is recommended that you are screened once a year. The recommended screening test is a low-dose CT scan.  You can make lifestyle changes to lower your risk of lung cancer.  Ask your health care provider about the risks and benefits of screening. This information is not intended to replace advice given to you by your health care provider. Make sure you discuss any questions you have with your health care provider. Document Revised: 12/15/2018 Document Reviewed: 07/14/2016 Elsevier Patient Education  2020 [employer].

## 2020-01-10 ENCOUNTER — Other Ambulatory Visit: Payer: Self-pay | Admitting: Adult Health

## 2020-01-10 DIAGNOSIS — E890 Postprocedural hypothyroidism: Secondary | ICD-10-CM

## 2020-01-10 DIAGNOSIS — J45909 Unspecified asthma, uncomplicated: Secondary | ICD-10-CM

## 2020-01-10 LAB — COMPLETE METABOLIC PANEL WITH GFR
AG Ratio: 1.6 (calc) (ref 1.0–2.5)
ALT: 25 U/L (ref 6–29)
AST: 24 U/L (ref 10–35)
Albumin: 4.3 g/dL (ref 3.6–5.1)
Alkaline phosphatase (APISO): 70 U/L (ref 37–153)
BUN: 18 mg/dL (ref 7–25)
CO2: 29 mmol/L (ref 20–32)
Calcium: 9.8 mg/dL (ref 8.6–10.4)
Chloride: 99 mmol/L (ref 98–110)
Creat: 0.62 mg/dL (ref 0.50–1.05)
GFR, Est African American: 115 mL/min/{1.73_m2} (ref >=60)
GFR, Est Non African American: 99 mL/min/{1.73_m2} (ref >=60)
Globulin: 2.7 g/dL (calc) (ref 1.9–3.7)
Glucose, Bld: 88 mg/dL (ref 65–99)
Potassium: 4.6 mmol/L (ref 3.5–5.3)
Sodium: 136 mmol/L (ref 135–146)
Total Bilirubin: 0.4 mg/dL (ref 0.2–1.2)
Total Protein: 7 g/dL (ref 6.1–8.1)

## 2020-01-10 LAB — CBC WITH DIFFERENTIAL/PLATELET
Absolute Monocytes: 323 cells/uL (ref 200–950)
Basophils Absolute: 21 cells/uL (ref 0–200)
Basophils Relative: 0.4 %
Eosinophils Absolute: 11 cells/uL — ABNORMAL LOW (ref 15–500)
Eosinophils Relative: 0.2 %
HCT: 44.5 % (ref 35.0–45.0)
Hemoglobin: 14.4 g/dL (ref 11.7–15.5)
Lymphs Abs: 2507 cells/uL (ref 850–3900)
MCH: 28.4 pg (ref 27.0–33.0)
MCHC: 32.4 g/dL (ref 32.0–36.0)
MCV: 87.8 fL (ref 80.0–100.0)
MPV: 10 fL (ref 7.5–12.5)
Monocytes Relative: 6.1 %
Neutro Abs: 2438 cells/uL (ref 1500–7800)
Neutrophils Relative %: 46 %
Platelets: 353 10*3/uL (ref 140–400)
RBC: 5.07 10*6/uL (ref 3.80–5.10)
RDW: 13.4 % (ref 11.0–15.0)
Total Lymphocyte: 47.3 %
WBC: 5.3 10*3/uL (ref 3.8–10.8)

## 2020-01-10 LAB — LIPID PANEL
Cholesterol: 269 mg/dL — ABNORMAL HIGH (ref <200)
HDL: 68 mg/dL (ref >=50)
LDL Cholesterol (Calc): 180 mg/dL (calc) — ABNORMAL HIGH
Non-HDL Cholesterol (Calc): 201 mg/dL (calc) — ABNORMAL HIGH (ref <130)
Total CHOL/HDL Ratio: 4 (calc) (ref <5.0)
Triglycerides: 92 mg/dL (ref <150)

## 2020-01-10 LAB — MAGNESIUM: Magnesium: 2 mg/dL (ref 1.5–2.5)

## 2020-01-10 LAB — TSH: TSH: 0.22 mIU/L — ABNORMAL LOW (ref 0.40–4.50)

## 2020-01-10 MED ORDER — SYNTHROID 137 MCG PO TABS: ORAL_TABLET | ORAL | 1 refills | Status: DC

## 2020-01-24 ENCOUNTER — Ambulatory Visit
Admission: RE | Admit: 2020-01-24 | Discharge: 2020-01-24 | Disposition: A | Discharge status: Home / Self Care | Payer: Medicare Other | Source: Ambulatory Visit | Attending: Adult Health | Admitting: Adult Health

## 2020-01-24 DIAGNOSIS — Z87891 Personal history of nicotine dependence: Secondary | ICD-10-CM

## 2020-02-13 ENCOUNTER — Ambulatory Visit (INDEPENDENT_AMBULATORY_CARE_PROVIDER_SITE_OTHER): Payer: Medicare Other

## 2020-02-13 ENCOUNTER — Other Ambulatory Visit: Payer: Self-pay

## 2020-02-13 DIAGNOSIS — E890 Postprocedural hypothyroidism: Secondary | ICD-10-CM

## 2020-02-13 NOTE — Addendum Note (Signed)
Addended by: Chancy Hurter on: 02/13/2020 11:37 AM   Modules accepted: Orders

## 2020-02-13 NOTE — Progress Notes (Signed)
Patient presents to the office for a nurse visit have TSH lab drawn. Patient states that she is taking Levothyroxine 1 tablet, 6 days a week. Forgot to follow instructions from last lab results. Vitals taken and recorded.

## 2020-02-14 DIAGNOSIS — M25539 Pain in unspecified wrist: Secondary | ICD-10-CM | POA: Diagnosis not present

## 2020-02-14 DIAGNOSIS — Z79899 Other long term (current) drug therapy: Secondary | ICD-10-CM | POA: Diagnosis not present

## 2020-02-14 DIAGNOSIS — G894 Chronic pain syndrome: Secondary | ICD-10-CM | POA: Diagnosis not present

## 2020-02-14 DIAGNOSIS — Z79891 Long term (current) use of opiate analgesic: Secondary | ICD-10-CM | POA: Diagnosis not present

## 2020-02-14 DIAGNOSIS — M47816 Spondylosis without myelopathy or radiculopathy, lumbar region: Secondary | ICD-10-CM | POA: Diagnosis not present

## 2020-02-14 DIAGNOSIS — M069 Rheumatoid arthritis, unspecified: Secondary | ICD-10-CM | POA: Diagnosis not present

## 2020-02-14 LAB — TSH: TSH: 1.9 mIU/L (ref 0.40–4.50)

## 2020-02-20 DIAGNOSIS — M3501 Sicca syndrome with keratoconjunctivitis: Secondary | ICD-10-CM | POA: Diagnosis not present

## 2020-02-20 DIAGNOSIS — E119 Type 2 diabetes mellitus without complications: Secondary | ICD-10-CM | POA: Diagnosis not present

## 2020-02-20 DIAGNOSIS — H2513 Age-related nuclear cataract, bilateral: Secondary | ICD-10-CM | POA: Diagnosis not present

## 2020-02-20 DIAGNOSIS — H02889 Meibomian gland dysfunction of unspecified eye, unspecified eyelid: Secondary | ICD-10-CM | POA: Diagnosis not present

## 2020-02-28 DIAGNOSIS — G894 Chronic pain syndrome: Secondary | ICD-10-CM | POA: Diagnosis not present

## 2020-02-28 DIAGNOSIS — M25559 Pain in unspecified hip: Secondary | ICD-10-CM | POA: Diagnosis not present

## 2020-02-28 DIAGNOSIS — M47816 Spondylosis without myelopathy or radiculopathy, lumbar region: Secondary | ICD-10-CM | POA: Diagnosis not present

## 2020-02-28 DIAGNOSIS — M79673 Pain in unspecified foot: Secondary | ICD-10-CM | POA: Diagnosis not present

## 2020-03-04 ENCOUNTER — Ambulatory Visit: Payer: Medicare Other | Admitting: Orthopedic Surgery

## 2020-03-06 ENCOUNTER — Other Ambulatory Visit: Payer: Self-pay

## 2020-03-06 ENCOUNTER — Encounter: Payer: Self-pay | Admitting: Orthopedic Surgery

## 2020-03-06 ENCOUNTER — Ambulatory Visit (INDEPENDENT_AMBULATORY_CARE_PROVIDER_SITE_OTHER): Payer: Medicare Other | Admitting: Orthopedic Surgery

## 2020-03-06 ENCOUNTER — Ambulatory Visit: Payer: Self-pay

## 2020-03-06 VITALS — Ht 62.5 in | Wt 199.0 lb

## 2020-03-06 DIAGNOSIS — M25531 Pain in right wrist: Secondary | ICD-10-CM

## 2020-03-06 DIAGNOSIS — M6701 Short Achilles tendon (acquired), right ankle: Secondary | ICD-10-CM | POA: Diagnosis not present

## 2020-03-06 DIAGNOSIS — M722 Plantar fascial fibromatosis: Secondary | ICD-10-CM | POA: Diagnosis not present

## 2020-03-06 DIAGNOSIS — M79671 Pain in right foot: Secondary | ICD-10-CM

## 2020-03-07 ENCOUNTER — Encounter: Payer: Self-pay | Admitting: Orthopedic Surgery

## 2020-03-07 NOTE — Progress Notes (Signed)
Office Visit Note   Patient: Autumn Frye           Date of Birth: 02/24/1961           MRN: [id number] Visit Date: 03/06/2020              Requested by: Unk Pinto, [address] [place],  [place] [address] PCP: Unk Pinto, MD  Chief Complaint  Patient presents with  . Right Foot - Pain      HPI: Patient is a 59 year old woman who presents complaining of right foot and right wrist pain.  She states she has had foot pain at the base of the plantar fascia.  She states she has tried stretching and Voltaren gel without relief.  Patient states she also has mid wrist pain of the right wrist.  Patient complains of heel pain with start up.  Patient has rheumatoid arthritis and states she is on a DMARD  Assessment & Plan: Visit Diagnoses:  1. Right foot pain   2. Pain in right wrist     Plan: Patient was given instructions and demonstrated Achilles stretching.  Recommend she continue use the Voltaren gel.  Recommend using the wrist splint and Voltaren gel for the arthritis in her hand.  Follow-Up Instructions: Return in about 4 weeks (around 04/03/2020).   Ortho Exam  Patient is alert, oriented, no adenopathy, well-dressed, normal affect, normal respiratory effort. On examination patient has a good dorsalis pedis pulse on the right with her knee extended she has dorsiflexion 20 degrees short of neutral with Achilles contracture.  She has pain to palpation of the origin of the plantar fascia.  The tarsal tunnel is nontender to palpation.  Examination of the right wrist she has no pain over the scaphoid or TFCC.  She is generally tender to palpation over the wrist.  Finkelstein's test is negative no extensor tenosynovitis.  Imaging: No results found. No images are attached to the encounter.  Labs: Lab Results  Component Value Date   HGBA1C 5.5 10/11/2019   HGBA1C 5.4 04/04/2019   HGBA1C 5.5 12/18/2018   ESRSEDRATE 26 02/05/2016   LABURIC 5.6  11/14/2015   REPTSTATUS 06/19/2017 FINAL 06/16/2017   CULT >=100,000 COLONIES/mL ESCHERICHIA COLI (A) 06/16/2017   LABORGA ESCHERICHIA COLI (A) 06/16/2017     Lab Results  Component Value Date   ALBUMIN 3.8 05/26/2017   ALBUMIN 4.0 01/28/2017   ALBUMIN 4.2 10/14/2016   LABURIC 5.6 11/14/2015    Lab Results  Component Value Date   MG 2.0 01/09/2020   MG 1.9 10/11/2019   MG 1.8 04/04/2019   Lab Results  Component Value Date   VD25OH 63 10/11/2019   VD25OH 72 04/04/2019   VD25OH 65 09/15/2018    No results found for: PREALBUMIN CBC EXTENDED Latest Ref Rng & Units 01/09/2020 10/11/2019 04/04/2019  WBC 3.8 - 10.8 Thousand/uL 5.3 7.0 5.8  RBC 3.80 - 5.10 Million/uL 5.07 5.03 4.65  HGB 11.7 - 15.5 g/dL 14.4 14.2 13.3  HCT 35 - 45 % 44.5 43.6 40.3  PLT 140 - 400 Thousand/uL 353 380 347  NEUTROABS 1,500 - 7,800 cells/uL 2,438 3,752 2,506  LYMPHSABS 850 - 3,900 cells/uL 2,507 2,849 2,917     Body mass index is 35.82 kg/m.  Orders:  Orders Placed This Encounter  Procedures  . XR Foot 2 Views Right   No orders of the defined types were placed in this encounter.    Procedures: No procedures performed  Clinical Data: No additional findings.  ROS:  All other systems negative, except as noted in the HPI. Review of Systems  Objective: Vital Signs: Ht 5' 2.5" (1.588 m)   Wt 199 lb (90.3 kg)   LMP  (LMP Unknown)   BMI 35.82 kg/m   Specialty Comments:  No specialty comments available.  PMFS History: Patient Active Problem List   Diagnosis Date Noted  . Former smoker, 30 pack year, quit 2015 01/09/2020  . Aortic atherosclerosis ([place]) 01/08/2020  . History of COVID-19 (11 Oct 2019)  10/12/2019  . Hidradenitis suppurativa 05/16/2019  . Morbid obesity ([place]) 11/08/2017  . Abnormal glucose 08/02/2017  . Cervical spondylosis with radiculopathy 05/30/2017  . Rheumatoid arthritis involving multiple sites ([place]) 11/14/2015  . Encounter for Medicare annual wellness exam  04/24/2015  . Sjogren's disease ([place]) 07/05/2014  . Medication management 03/26/2014  . Mixed hyperlipidemia 09/24/2013  . Vitamin D deficiency 09/24/2013  . Intrinsic asthma   . Hypertension   . Hypothyroidism   . Degeneration of intervertebral disc of lumbosacral region 05/23/2008   Past Medical History:  Diagnosis Date  . Arthritis    OA, RA  . Asthma    with a cold  . Autoimmune deficiency syndrome ([place])   . Back pain   . Cancer ([place])    thyroid-1995  . Family history of adverse reaction to anesthesia    mother problems with n/v  . Fibromyalgia   . GERD (gastroesophageal reflux disease)   . Hepatitis    'A" when pt. was 12 yrs. old  . Hiatal hernia   . Hyperlipidemia   . Hypertension   . Hypothyroidism   . Pneumonia     Family History  Problem Relation Age of Onset  . Hypertension Father   . Stroke Father   . Diabetes Brother   . Asthma Brother   . Other Daughter        XED CONNECTIVE TISSUE  . Cancer Maternal Grandmother        BREAST  . Breast cancer Maternal Grandmother 13  . Colon cancer Neg Hx   . Esophageal cancer Neg Hx   . Stomach cancer Neg Hx   . Rectal cancer Neg Hx     Past Surgical History:  Procedure Laterality Date  . ANTERIOR CERVICAL DECOMP/DISCECTOMY FUSION N/A 05/30/2017   Procedure: Cervical five-six, Cervical six-seven Anterior discectomy with fusion and plate fixation;  Surgeon: Ditty, Kevan Ny, MD;  Location: [facility];  Service: Neurosurgery;  Laterality: N/A;  . ANTERIOR LAT LUMBAR FUSION Left 01/26/2013   Procedure: ANTERIOR LATERAL LUMBAR FUSION 1 LEVEL;  Surgeon: Faythe Ghee, MD;  Location: [facility];  Service: Neurosurgery;  Laterality: Left;  lumbar four-five  . KNEE ARTHROPLASTY Left 02/16/2016   Procedure: COMPUTER ASSISTED TOTAL KNEE ARTHROPLASTY;  Surgeon: Dereck Leep, MD;  Location: [facility];  Service: Orthopedics;  Laterality: Left;  . KNEE SURGERY     3 on right knee, 1 on left-prior to TKR  . LUMBAR  PERCUTANEOUS PEDICLE SCREW 1 LEVEL Left 01/26/2013   Procedure: LUMBAR PERCUTANEOUS PEDICLE SCREW 1 LEVEL;  Surgeon: Faythe Ghee, MD;  Location: [facility];  Service: Neurosurgery;  Laterality: Left;  lumbar four-five  . PILONIDAL CYST EXCISION    . THYROIDECTOMY    . TONSILLECTOMY    . TOTAL KNEE ARTHROPLASTY     right  . UPPER GASTROINTESTINAL ENDOSCOPY    . VAGINAL HYSTERECTOMY     Social History   Occupational  History  . Not on file  Tobacco Use  . Smoking status: Former Smoker    Packs/day: 0.75    Years: 40.00    Pack years: 30.00    Types: Cigarettes    Quit date: 07/05/2015    Years since quitting: 4.6  . Smokeless tobacco: Never Used  Substance and Sexual Activity  . Alcohol use: Yes    Alcohol/week: 0.0 standard drinks    Comment: rare  . Drug use: No  . Sexual activity: Not on file

## 2020-03-12 DIAGNOSIS — M797 Fibromyalgia: Secondary | ICD-10-CM | POA: Diagnosis not present

## 2020-03-12 DIAGNOSIS — M0609 Rheumatoid arthritis without rheumatoid factor, multiple sites: Secondary | ICD-10-CM | POA: Diagnosis not present

## 2020-03-12 DIAGNOSIS — Z79899 Other long term (current) drug therapy: Secondary | ICD-10-CM | POA: Diagnosis not present

## 2020-03-12 DIAGNOSIS — M25539 Pain in unspecified wrist: Secondary | ICD-10-CM | POA: Diagnosis not present

## 2020-04-03 ENCOUNTER — Ambulatory Visit: Payer: Medicare Other | Admitting: Orthopedic Surgery

## 2020-04-10 DIAGNOSIS — M79673 Pain in unspecified foot: Secondary | ICD-10-CM | POA: Diagnosis not present

## 2020-04-10 DIAGNOSIS — G894 Chronic pain syndrome: Secondary | ICD-10-CM | POA: Diagnosis not present

## 2020-04-10 DIAGNOSIS — M47816 Spondylosis without myelopathy or radiculopathy, lumbar region: Secondary | ICD-10-CM | POA: Diagnosis not present

## 2020-04-10 DIAGNOSIS — M25539 Pain in unspecified wrist: Secondary | ICD-10-CM | POA: Diagnosis not present

## 2020-04-12 ENCOUNTER — Other Ambulatory Visit: Payer: Self-pay | Admitting: Internal Medicine

## 2020-05-11 ENCOUNTER — Other Ambulatory Visit: Payer: Self-pay | Admitting: Adult Health

## 2020-05-11 ENCOUNTER — Other Ambulatory Visit: Payer: Self-pay | Admitting: Internal Medicine

## 2020-05-11 MED ORDER — FUROSEMIDE 40 MG PO TABS: ORAL_TABLET | ORAL | 0 refills | Status: DC

## 2020-05-12 ENCOUNTER — Encounter: Payer: Self-pay | Admitting: Internal Medicine

## 2020-05-12 DIAGNOSIS — E8881 Metabolic syndrome: Secondary | ICD-10-CM | POA: Insufficient documentation

## 2020-05-12 NOTE — Progress Notes (Signed)
Annual Screening/Preventative Visit & Comprehensive Evaluation &  Examination      This very nice 59 y.o.  MWF presents for a Screening /Preventative Visit & comprehensive evaluation and management of multiple medical co-morbidities.  Patient has been followed for HTN, HLD,  Hypothyroidism, Morbid Obesity, Prediabetes  and Vitamin D Deficiency.     In May 2021, she had Negative LD screening  Chest CT scan recommending annual f/u.      Patient is on SS Disability for Rheumatoid Arthritis since 2009 and she is  on Orencia / Abatacept  followed by Dr Dossie Der.  She has chronic neck & back  Pain attributed to RA & DDD and has spinal stimulator and is on buprenorphine patches via Dr. Vira Blanco.        HTN predates circa 1980. Patient's BP has been controlled at home and patient denies any cardiac symptoms as chest pain, palpitations, shortness of breath, dizziness or ankle swelling. Today's BP is at goal - 126/80.       Patient's hyperlipidemia is controlled with diet and  Rosuvastatin / Ezetimibe. Patient denies myalgias or other medication SE's. Last lipids were not at goal:  Lab Results  Component Value Date   CHOL 269 (H) 01/09/2020   HDL 68 01/09/2020   LDLCALC 180 (H) 01/09/2020   TRIG 92 01/09/2020   CHOLHDL 4.0 01/09/2020       Patient has Morbid Obesity (BMI 36.7+) and consequent  Prediabetes (A1c 5.9% / 2010) and in June 2019, she was dx'd with  Insulin Resistance  Of obesity by Dr Suzette Battiest & started on Metformin.   Patient denies reactive hypoglycemic symptoms, visual blurring, diabetic polys or paresthesias. Last A1c was Normal & at goal:  Lab Results  Component Value Date   HGBA1C 5.5 10/11/2019       The patient has a remote hx/o thyroid cancer, s/p Thyroidectomy in 1995 and has been on replacement / suppressive therapy since.        Finally, patient has history of Vitamin D Deficiency ("27" /2009&"37" /2016) and last Vitamin D was at goal:  Lab Results  Component Value Date    VD25OH 63 10/11/2019    Current Outpatient Medications on File Prior to Visit  Medication Sig  . Abatacept 125 MG/ML SOSY Inject 1 Syringe into the skin every 7 (seven) days.  Marland Kitchen acetaminophen (TYLENOL) 500 MG tablet Take 1 tablet (500 mg total) by mouth every 6 (six) hours as needed.  . Acetylcysteine (N-ACETYL-L-CYSTEINE PO) Take 1 capsule by mouth daily.  Marland Kitchen albuterol (PROVENTIL) (2.5 MG/3ML) 0.083% nebulizer solution Take 3 mLs (2.5 mg total) by nebulization every 6 (six) hours as needed for wheezing or shortness of breath.  Marland Kitchen albuterol (VENTOLIN HFA) 108 (90 Base) MCG/ACT inhaler Use 2 inhalations 15 minutes apart every 4 hours as needed to rescue Asthma  . amitriptyline (ELAVIL) 25 MG tablet Take 1 tablet every Morning & 3 tablets at Bedtime for Chronic Pain  . ASPIRIN 81 PO Take by mouth daily.  Marland Kitchen BREO ELLIPTA 200-25 MCG/INH AEPB INHALE 1 PUFF BY MOUTH INTO THE LUNGS DAILY( RINSE MOUTH AFTERWARDS)  . buprenorphine (BUTRANS) 10 MCG/HR PTWK patch Place 15 mcg onto the skin once a week.   . Cholecalciferol (VITAMIN D3) 5000 UNITS TABS Take 10,000 Units by mouth daily.   Marland Kitchen CRANBERRY PO Take by mouth daily.  . diclofenac Sodium (VOLTAREN) 1 % GEL diclofenac 1 % topical gel  APP 2 GRAMS TOPICALLY AA TWO TO TID  . doxycycline (  VIBRAMYCIN) 50 MG capsule with lunch.  . Eyelid Cleansers (AVENOVA) 0.01 % SOLN Place 1 application into both eyes 2 (two) times daily. Apply to eyelids BID  . ezetimibe (ZETIA) 10 MG tablet TAKE 1 TABLET BY MOUTH DAILY FOR CHOLESTEROL  . FEMRING 0.1 MG/24HR RING INSERT 1 RING VAGINALLY EVERY 3 MONTHS  . fexofenadine (ALLEGRA) 180 MG tablet Take 180 mg by mouth daily.  . furosemide (LASIX) 40 MG tablet Take     1 tablet     3 x /day      for Fluid Retention / Ankle Swelling  . L-GLUTAMINE PO Take 1 capsule by mouth daily.  Marland Kitchen l-methylfolate-B6-B12 (METANX) 3-35-2 MG TABS tablet Take 1 tablet by mouth daily.  . Levomefolate Glucosamine (METHYLFOLATE PO) Take 5,000 mg  by mouth daily.  Marland Kitchen LOTEMAX 0.5 % GEL Place 1 drop into both eyes 3 (three) times daily.  . Magnesium Oxide 500 MG TABS Take 500 mg by mouth daily.  . Menaquinone-7 (VITAMIN K2) 100 MCG CAPS Take 1 capsule by mouth daily.  Marland Kitchen MILK THISTLE EXTRACT PO Take 2 capsules by mouth daily.  . montelukast (SINGULAIR) 10 MG tablet Take 1 tablet Daily for Allergies  . mupirocin ointment (BACTROBAN) 2 % APP EXT AA TID FOR 10 DAYS  . Omega-3 Fatty Acids (FISH OIL) 1200 MG CAPS Take 2 capsules by mouth daily.   . ondansetron (ZOFRAN) 8 MG tablet Take 1/2 to 1 tablet 2 to 3 x /day as needed for Nausea  . Polyethyl Glycol-Propyl Glycol (SYSTANE) 0.4-0.3 % SOLN Place 1 drop into both eyes 4 (four) times daily.   . RESTASIS 0.05 % ophthalmic emulsion Place 1 drop into both eyes daily.   . rosuvastatin (CRESTOR) 40 MG tablet Take 1 tablet Daily for Cholesterol  . SYNTHROID 137 MCG tablet Take 1/2 tab 1 day a week, whole tab other days. Take first thing in the morning on an empty stomach with water only. No other drinks/food/meds for 30-60 min.  . triamcinolone (NASACORT) 55 MCG/ACT AERO nasal inhaler 1 spray by Both Nostrils route daily.  . TURMERIC CURCUMIN PO Take 900 mg elemental calcium/kg/hr by mouth 2 (two) times daily.   No current facility-administered medications on file prior to visit.   Allergies  Allergen Reactions  . Ciprofloxacin Hives and Swelling    SWELLING REACTION UNSPECIFIED   . Levaquin [Levofloxacin] Hives  . Lyrica [Pregabalin] Other (See Comments)    Altered mental status ALTERED MENTAL STATUS HALLUCINATIONS  . Compazine [Prochlorperazine Edisylate]     UNSPECIFIED REACTION   . Hydrocodone Itching  . Morphine And Related Itching  . Penicillins Itching  . Sulfa Antibiotics Itching   Past Medical History:  Diagnosis Date  . Arthritis    OA, RA  . Asthma    with a cold  . Autoimmune deficiency syndrome ([place])   . Back pain   . Cancer ([place])    thyroid-1995  . Family history  of adverse reaction to anesthesia    mother problems with n/v  . Fibromyalgia   . GERD (gastroesophageal reflux disease)   . Hepatitis    'A" when pt. was 12 yrs. old  . Hiatal hernia   . Hyperlipidemia   . Hypertension   . Hypothyroidism   . Pneumonia    Health Maintenance  Topic Date Due  . COVID-19 Vaccine (1) Never done  . URINE MICROALBUMIN  04/03/2020  . INFLUENZA VACCINE  04/06/2020  . MAMMOGRAM  05/08/2020  . COLONOSCOPY  10/12/2021  . TETANUS/TDAP  09/15/2022  . Hepatitis C Screening  Completed  . HIV Screening  Completed  . PAP SMEAR-Modifier  Discontinued   Immunization History  Administered Date(s) Administered  . DTaP 09/15/2012, 09/15/2012  . Influenza Inj Mdck Quad With Preservative 08/02/2017  . Influenza Whole 09/06/2012  . Influenza-Unspecified 05/07/2014, 05/20/2016, 08/02/2017  . Pneumococcal Conjugate-13 09/15/2012, 07/05/2014  . Zoster 09/03/2013    Last Colon - 10/13/2011 - Dr Fuller Plan - recc 10 yr f/u - due Feb 2023  Last MGM - 05/09/2019  Past Surgical History:  Procedure Laterality Date  . ANTERIOR CERVICAL DECOMP/DISCECTOMY FUSION N/A 05/30/2017   Procedure: Cervical five-six, Cervical six-seven Anterior discectomy with fusion and plate fixation;  Surgeon: Ditty, Kevan Ny, MD;  Location: [facility];  Service: Neurosurgery;  Laterality: N/A;  . ANTERIOR LAT LUMBAR FUSION Left 01/26/2013   Procedure: ANTERIOR LATERAL LUMBAR FUSION 1 LEVEL;  Surgeon: Faythe Ghee, MD;  Location: [facility];  Service: Neurosurgery;  Laterality: Left;  lumbar four-five  . KNEE ARTHROPLASTY Left 02/16/2016   Procedure: COMPUTER ASSISTED TOTAL KNEE ARTHROPLASTY;  Surgeon: Dereck Leep, MD;  Location: [facility];  Service: Orthopedics;  Laterality: Left;  . KNEE SURGERY     3 on right knee, 1 on left-prior to TKR  . LUMBAR PERCUTANEOUS PEDICLE SCREW 1 LEVEL Left 01/26/2013   Procedure: LUMBAR PERCUTANEOUS PEDICLE SCREW 1 LEVEL;  Surgeon: Faythe Ghee, MD;   Location: [facility];  Service: Neurosurgery;  Laterality: Left;  lumbar four-five  . PILONIDAL CYST EXCISION    . THYROIDECTOMY    . TONSILLECTOMY    . TOTAL KNEE ARTHROPLASTY     right  . UPPER GASTROINTESTINAL ENDOSCOPY    . VAGINAL HYSTERECTOMY     Family History  Problem Relation Age of Onset  . Hypertension Father   . Stroke Father   . Diabetes Brother   . Asthma Brother   . Other Daughter        XED CONNECTIVE TISSUE  . Cancer Maternal Grandmother        BREAST  . Breast cancer Maternal Grandmother 55  . Colon cancer Neg Hx   . Esophageal cancer Neg Hx   . Stomach cancer Neg Hx   . Rectal cancer Neg Hx    Social History   Tobacco Use  . Smoking status: Former Smoker    Packs/day: 0.75    Years: 40.00    Pack years: 30.00    Types: Cigarettes    Quit date: 07/05/2015    Years since quitting: 4.8  . Smokeless tobacco: Never Used  Substance Use Topics  . Alcohol use: Yes    Alcohol/week: 0.0 standard drinks    Comment: rare  . Drug use: No    ROS Constitutional: Denies fever, chills, weight loss/gain, headaches, insomnia,  night sweats, and change in appetite. Does c/o fatigue. Eyes: Denies redness, blurred vision, diplopia, discharge, itchy, watery eyes.  ENT: Denies discharge, congestion, post nasal drip, epistaxis, sore throat, earache, hearing loss, dental pain, Tinnitus, Vertigo, Sinus pain, snoring.  Cardio: Denies chest pain, palpitations, irregular heartbeat, syncope, dyspnea, diaphoresis, orthopnea, PND, claudication, edema Respiratory: denies cough, dyspnea, DOE, pleurisy, hoarseness, laryngitis, wheezing.  Gastrointestinal: Denies dysphagia, heartburn, reflux, water brash, pain, cramps, nausea, vomiting, bloating, diarrhea, constipation, hematemesis, melena, hematochezia, jaundice, hemorrhoids Genitourinary: Denies dysuria, frequency, urgency, nocturia, hesitancy, discharge, hematuria, flank pain Breast: Breast lumps, nipple discharge, bleeding.    Musculoskeletal: Denies arthralgia, myalgia, stiffness, Jt. Swelling, pain, limp,  and strain/sprain. Denies falls. Skin: Denies puritis, rash, hives, warts, acne, eczema, changing in skin lesion Neuro: No weakness, tremor, incoordination, spasms, paresthesia, pain Psychiatric: Denies confusion, memory loss, sensory loss. Denies Depression. Endocrine: Denies change in weight, skin, hair change, nocturia, and paresthesia, diabetic polys, visual blurring, hyper / hypo glycemic episodes.  Heme/Lymph: No excessive bleeding, bruising, enlarged lymph nodes.  Physical Exam  BP 126/80   Pulse 100   Temp 98.6 F (37 C)   Ht 5\' 1"  (1.549 m)   Wt 203 lb (92.1 kg)   LMP  (LMP Unknown)   SpO2 96%   BMI 38.36 kg/m   General Appearance:  Over nourished, well groomed and in no apparent distress.  Eyes: PERRLA, EOMs, conjunctiva no swelling or erythema, normal fundi and vessels. Sinuses: No frontal/maxillary tenderness ENT/Mouth: EACs patent / TMs  nl. Nares clear without erythema, swelling, mucoid exudates. Oral hygiene is good. No erythema, swelling, or exudate. Tongue normal, non-obstructing. Tonsils not swollen or erythematous. Hearing normal.  Neck: Supple, thyroid not palpable. No bruits, nodes or JVD. Respiratory: Respiratory effort normal.  BS equal and clear bilateral without rales, rhonci, wheezing or stridor. Cardio: Heart sounds are normal with regular rate and rhythm and no murmurs, rubs or gallops. Peripheral pulses are normal and equal bilaterally without edema. No aortic or femoral bruits. Chest: symmetric with normal excursions and percussion. Breasts: Symmetric, without lumps, nipple discharge, retractions, or fibrocystic changes.  Abdomen: Flat, soft with bowel sounds active. Nontender, no guarding, rebound, hernias, masses, or organomegaly.  Lymphatics: Non tender without lymphadenopathy.  Genitourinary:  Musculoskeletal: Full ROM all peripheral extremities, joint stability, 5/5  strength, and normal gait. Skin: Warm and dry without rashes, lesions, cyanosis, clubbing or  ecchymosis.  Neuro: Cranial nerves intact, reflexes equal bilaterally. Normal muscle tone, no cerebellar symptoms. Sensation intact.  Pysch: Alert and oriented X 3, normal affect, Insight and Judgment appropriate.   Assessment and Plan  1. Annual Preventative Screening Examination   2. Essential hypertension  - EKG 12-Lead - Urinalysis, Routine w reflex microscopic - Microalbumin / creatinine urine ratio - CBC with Differential/Platelet - COMPLETE METABOLIC PANEL WITH GFR - Magnesium - TSH  3. Hyperlipidemia, mixed  - EKG 12-Lead - Lipid panel - TSH  4. Abnormal glucose  - EKG 12-Lead - Insulin, random  5. Insulin resistance  - EKG 12-Lead - Hemoglobin A1c - Insulin, random  6. Vitamin D deficiency  - VITAMIN D 25 Hydroxy   7. Postoperative hypothyroidism  - TSH  8. Rheumatoid arthritis involving multiple sites with  positive rheumatoid factor (HCC)   9. Class 2 severe obesity with serious comorbidity and  body mass index (BMI) of 37.0 to 37.9 in adult  (HCC)  - TSH  10. Chronic bronchitis,(HCC)   11. Screening for ischemic heart disease  - EKG 12-Lead  12. Family history of cardiovascular disease  - EKG 12-Lead  13. Former smoker  - EKG 12-Lead  14. Medication management  - Urinalysis, Routine w reflex microscopic - Microalbumin / creatinine urine ratio - CBC with Differential/Platelet - COMPLETE METABOLIC PANEL WITH GFR - Magnesium - Lipid panel - TSH - Hemoglobin A1c - Insulin, random - VITAMIN D 25 Hydroxy  15. Screening for colorectal cancer  - POC Hemoccult Bld/Stl         Patient was counseled in prudent diet to achieve/maintain BMI less than 25 for weight control, BP monitoring, regular exercise and medications. Discussed med's effects and SE's. Screening labs and tests as requested  with regular follow-up as recommended. Over 40  minutes of exam, counseling, chart review and high complex critical decision making was performed.   Kirtland Bouchard, MD

## 2020-05-12 NOTE — Patient Instructions (Signed)

## 2020-05-14 ENCOUNTER — Other Ambulatory Visit: Payer: Self-pay

## 2020-05-14 ENCOUNTER — Ambulatory Visit (INDEPENDENT_AMBULATORY_CARE_PROVIDER_SITE_OTHER): Payer: Medicare Other | Admitting: Internal Medicine

## 2020-05-14 ENCOUNTER — Encounter: Payer: Self-pay | Admitting: Internal Medicine

## 2020-05-14 ENCOUNTER — Ambulatory Visit: Payer: Medicare Other | Admitting: Internal Medicine

## 2020-05-14 VITALS — BP 126/80 | HR 100 | Temp 98.6°F | Ht 61.0 in | Wt 203.0 lb

## 2020-05-14 DIAGNOSIS — J42 Unspecified chronic bronchitis: Secondary | ICD-10-CM

## 2020-05-14 DIAGNOSIS — Z136 Encounter for screening for cardiovascular disorders: Secondary | ICD-10-CM | POA: Diagnosis not present

## 2020-05-14 DIAGNOSIS — R7309 Other abnormal glucose: Secondary | ICD-10-CM | POA: Diagnosis not present

## 2020-05-14 DIAGNOSIS — I10 Essential (primary) hypertension: Secondary | ICD-10-CM

## 2020-05-14 DIAGNOSIS — Z0000 Encounter for general adult medical examination without abnormal findings: Secondary | ICD-10-CM | POA: Diagnosis not present

## 2020-05-14 DIAGNOSIS — Z87891 Personal history of nicotine dependence: Secondary | ICD-10-CM

## 2020-05-14 DIAGNOSIS — E8881 Metabolic syndrome: Secondary | ICD-10-CM

## 2020-05-14 DIAGNOSIS — Z1211 Encounter for screening for malignant neoplasm of colon: Secondary | ICD-10-CM

## 2020-05-14 DIAGNOSIS — Z0001 Encounter for general adult medical examination with abnormal findings: Secondary | ICD-10-CM

## 2020-05-14 DIAGNOSIS — E559 Vitamin D deficiency, unspecified: Secondary | ICD-10-CM

## 2020-05-14 DIAGNOSIS — Z8249 Family history of ischemic heart disease and other diseases of the circulatory system: Secondary | ICD-10-CM | POA: Diagnosis not present

## 2020-05-14 DIAGNOSIS — E782 Mixed hyperlipidemia: Secondary | ICD-10-CM

## 2020-05-14 DIAGNOSIS — M0579 Rheumatoid arthritis with rheumatoid factor of multiple sites without organ or systems involvement: Secondary | ICD-10-CM

## 2020-05-14 DIAGNOSIS — E890 Postprocedural hypothyroidism: Secondary | ICD-10-CM

## 2020-05-14 DIAGNOSIS — Z79899 Other long term (current) drug therapy: Secondary | ICD-10-CM

## 2020-05-14 DIAGNOSIS — I700 Atherosclerosis of aorta: Secondary | ICD-10-CM

## 2020-05-15 LAB — CBC WITH DIFFERENTIAL/PLATELET
Absolute Monocytes: 391 cells/uL (ref 200–950)
Basophils Absolute: 19 cells/uL (ref 0–200)
Basophils Relative: 0.3 %
Eosinophils Absolute: 12 cells/uL — ABNORMAL LOW (ref 15–500)
Eosinophils Relative: 0.2 %
HCT: 41.4 % (ref 35.0–45.0)
Hemoglobin: 13.8 g/dL (ref 11.7–15.5)
Lymphs Abs: 2747 cells/uL (ref 850–3900)
MCH: 29.3 pg (ref 27.0–33.0)
MCHC: 33.3 g/dL (ref 32.0–36.0)
MCV: 87.9 fL (ref 80.0–100.0)
MPV: 9.9 fL (ref 7.5–12.5)
Monocytes Relative: 6.3 %
Neutro Abs: 3032 cells/uL (ref 1500–7800)
Neutrophils Relative %: 48.9 %
Platelets: 348 10*3/uL (ref 140–400)
RBC: 4.71 10*6/uL (ref 3.80–5.10)
RDW: 12.6 % (ref 11.0–15.0)
Total Lymphocyte: 44.3 %
WBC: 6.2 10*3/uL (ref 3.8–10.8)

## 2020-05-15 LAB — URINALYSIS, ROUTINE W REFLEX MICROSCOPIC
Bilirubin Urine: NEGATIVE
Glucose, UA: NEGATIVE
Hyaline Cast: NONE SEEN /LPF
Ketones, ur: NEGATIVE
Leukocytes,Ua: NEGATIVE
Nitrite: NEGATIVE
RBC / HPF: NONE SEEN /HPF (ref 0–2)
Specific Gravity, Urine: 1.016 (ref 1.001–1.03)
WBC, UA: NONE SEEN /HPF (ref 0–5)
pH: 6 (ref 5.0–8.0)

## 2020-05-15 LAB — COMPLETE METABOLIC PANEL WITH GFR
AG Ratio: 1.7 (calc) (ref 1.0–2.5)
ALT: 37 U/L — ABNORMAL HIGH (ref 6–29)
AST: 36 U/L — ABNORMAL HIGH (ref 10–35)
Albumin: 4.3 g/dL (ref 3.6–5.1)
Alkaline phosphatase (APISO): 62 U/L (ref 37–153)
BUN: 12 mg/dL (ref 7–25)
CO2: 28 mmol/L (ref 20–32)
Calcium: 9.5 mg/dL (ref 8.6–10.4)
Chloride: 102 mmol/L (ref 98–110)
Creat: 0.58 mg/dL (ref 0.50–1.05)
GFR, Est African American: 117 mL/min/{1.73_m2} (ref >=60)
GFR, Est Non African American: 101 mL/min/{1.73_m2} (ref >=60)
Globulin: 2.5 g/dL (calc) (ref 1.9–3.7)
Glucose, Bld: 84 mg/dL (ref 65–99)
Potassium: 4.7 mmol/L (ref 3.5–5.3)
Sodium: 138 mmol/L (ref 135–146)
Total Bilirubin: 0.4 mg/dL (ref 0.2–1.2)
Total Protein: 6.8 g/dL (ref 6.1–8.1)

## 2020-05-15 LAB — LIPID PANEL
Cholesterol: 160 mg/dL (ref <200)
HDL: 75 mg/dL (ref >=50)
LDL Cholesterol (Calc): 68 mg/dL (calc)
Non-HDL Cholesterol (Calc): 85 mg/dL (calc) (ref <130)
Total CHOL/HDL Ratio: 2.1 (calc) (ref <5.0)
Triglycerides: 83 mg/dL (ref <150)

## 2020-05-15 LAB — MICROALBUMIN / CREATININE URINE RATIO
Creatinine, Urine: 56 mg/dL (ref 20–275)
Microalb Creat Ratio: 159 mcg/mg creat — ABNORMAL HIGH (ref <30)
Microalb, Ur: 8.9 mg/dL

## 2020-05-15 LAB — HEMOGLOBIN A1C
Hgb A1c MFr Bld: 5.4 % of total Hgb (ref <5.7)
Mean Plasma Glucose: 108 (calc)
eAG (mmol/L): 6 (calc)

## 2020-05-15 LAB — MAGNESIUM: Magnesium: 1.8 mg/dL (ref 1.5–2.5)

## 2020-05-15 LAB — VITAMIN D 25 HYDROXY (VIT D DEFICIENCY, FRACTURES): Vit D, 25-Hydroxy: 53 ng/mL (ref 30–100)

## 2020-05-15 LAB — TSH: TSH: 0.84 mIU/L (ref 0.40–4.50)

## 2020-05-15 LAB — INSULIN, RANDOM: Insulin: 6.5 u[IU]/mL

## 2020-05-15 NOTE — Progress Notes (Signed)
========================================================== -   Test results slightly outside the reference range are not unusual. If there is anything important, I will review this with you,  otherwise it is considered normal test values.  If you have further questions,  please do not hesitate to contact me at the office or via My Chart.  ==========================================================  -  Total Chol = Much, Much better (down from 269 to now 160)   - and the bad , Dangerous LDL Chol  is down from 180 to now 68 - Excellent   ==========================================================  -  A1c - Normal - Great - No Diabetes !  ==========================================================  -  Vitamin D = 53 - a little low   - Vitamin D goal is between 70-100.   - Please make sure that you are taking your Vitamin D as directed.   - It is very important as a natural anti-inflammatory and helping the  immune system protect against viral infections, like the Covid-19    helping hair, skin, and nails, as well as reducing stroke and  heart attack risk.   - It helps your bones and helps with mood.  - It also decreases numerous cancer risks so please take it as directed.   - Low Vit D is associated with a 200-300% higher risk for CANCER   and 200-300% higher risk for HEART   ATTACK  &  STROKE.    - It is also associated with higher death rate at younger ages,   autoimmune diseases like Rheumatoid arthritis, Lupus, Multiple Sclerosis.     - Also many other serious conditions, like depression, Alzheimer's  Dementia, infertility, muscle aches, fatigue, fibromyalgia  - just to name a few.  ==========================================================  - All Else - CBC - Kidneys - Electrolytes - Liver - Magnesium & Thyroid    - all  Normal / OK ==========================================================

## 2020-05-19 ENCOUNTER — Other Ambulatory Visit: Payer: Self-pay | Admitting: Internal Medicine

## 2020-05-19 DIAGNOSIS — J45909 Unspecified asthma, uncomplicated: Secondary | ICD-10-CM

## 2020-05-28 ENCOUNTER — Other Ambulatory Visit: Payer: Self-pay | Admitting: Internal Medicine

## 2020-05-28 DIAGNOSIS — E782 Mixed hyperlipidemia: Secondary | ICD-10-CM

## 2020-06-11 DIAGNOSIS — M069 Rheumatoid arthritis, unspecified: Secondary | ICD-10-CM | POA: Diagnosis not present

## 2020-06-11 DIAGNOSIS — M47817 Spondylosis without myelopathy or radiculopathy, lumbosacral region: Secondary | ICD-10-CM | POA: Diagnosis not present

## 2020-06-11 DIAGNOSIS — M961 Postlaminectomy syndrome, not elsewhere classified: Secondary | ICD-10-CM | POA: Diagnosis not present

## 2020-06-11 DIAGNOSIS — Z981 Arthrodesis status: Secondary | ICD-10-CM | POA: Diagnosis not present

## 2020-06-12 ENCOUNTER — Other Ambulatory Visit: Payer: Self-pay | Admitting: Internal Medicine

## 2020-06-12 DIAGNOSIS — J45909 Unspecified asthma, uncomplicated: Secondary | ICD-10-CM

## 2020-06-26 DIAGNOSIS — C73 Malignant neoplasm of thyroid gland: Secondary | ICD-10-CM | POA: Diagnosis not present

## 2020-06-26 DIAGNOSIS — R7303 Prediabetes: Secondary | ICD-10-CM | POA: Diagnosis not present

## 2020-06-26 DIAGNOSIS — R7301 Impaired fasting glucose: Secondary | ICD-10-CM | POA: Diagnosis not present

## 2020-06-26 DIAGNOSIS — E890 Postprocedural hypothyroidism: Secondary | ICD-10-CM | POA: Diagnosis not present

## 2020-07-02 DIAGNOSIS — Z1231 Encounter for screening mammogram for malignant neoplasm of breast: Secondary | ICD-10-CM | POA: Diagnosis not present

## 2020-07-02 LAB — HM MAMMOGRAPHY

## 2020-07-03 DIAGNOSIS — R7301 Impaired fasting glucose: Secondary | ICD-10-CM | POA: Diagnosis not present

## 2020-07-03 DIAGNOSIS — E890 Postprocedural hypothyroidism: Secondary | ICD-10-CM | POA: Diagnosis not present

## 2020-07-03 DIAGNOSIS — C73 Malignant neoplasm of thyroid gland: Secondary | ICD-10-CM | POA: Diagnosis not present

## 2020-07-08 ENCOUNTER — Encounter: Payer: Self-pay | Admitting: Internal Medicine

## 2020-08-08 ENCOUNTER — Other Ambulatory Visit: Payer: Self-pay | Admitting: Internal Medicine

## 2020-08-08 ENCOUNTER — Other Ambulatory Visit: Payer: Self-pay | Admitting: Adult Health

## 2020-08-08 DIAGNOSIS — E782 Mixed hyperlipidemia: Secondary | ICD-10-CM

## 2020-08-13 DIAGNOSIS — Z9689 Presence of other specified functional implants: Secondary | ICD-10-CM | POA: Diagnosis not present

## 2020-08-13 DIAGNOSIS — M47817 Spondylosis without myelopathy or radiculopathy, lumbosacral region: Secondary | ICD-10-CM | POA: Diagnosis not present

## 2020-08-13 DIAGNOSIS — Z79891 Long term (current) use of opiate analgesic: Secondary | ICD-10-CM | POA: Diagnosis not present

## 2020-08-13 DIAGNOSIS — M961 Postlaminectomy syndrome, not elsewhere classified: Secondary | ICD-10-CM | POA: Diagnosis not present

## 2020-08-13 DIAGNOSIS — G894 Chronic pain syndrome: Secondary | ICD-10-CM | POA: Diagnosis not present

## 2020-08-13 DIAGNOSIS — Z79899 Other long term (current) drug therapy: Secondary | ICD-10-CM | POA: Diagnosis not present

## 2020-08-13 DIAGNOSIS — M545 Low back pain, unspecified: Secondary | ICD-10-CM | POA: Diagnosis not present

## 2020-08-15 ENCOUNTER — Other Ambulatory Visit: Payer: Self-pay | Admitting: Adult Health

## 2020-08-15 DIAGNOSIS — J45909 Unspecified asthma, uncomplicated: Secondary | ICD-10-CM

## 2020-08-21 ENCOUNTER — Ambulatory Visit (INDEPENDENT_AMBULATORY_CARE_PROVIDER_SITE_OTHER): Payer: Medicare Other | Admitting: Adult Health Nurse Practitioner

## 2020-08-21 ENCOUNTER — Other Ambulatory Visit: Payer: Self-pay

## 2020-08-21 ENCOUNTER — Encounter: Payer: Self-pay | Admitting: Adult Health Nurse Practitioner

## 2020-08-21 VITALS — BP 136/72 | HR 63 | Temp 97.7°F | Ht 61.0 in | Wt 198.0 lb

## 2020-08-21 DIAGNOSIS — I700 Atherosclerosis of aorta: Secondary | ICD-10-CM

## 2020-08-21 DIAGNOSIS — M5137 Other intervertebral disc degeneration, lumbosacral region: Secondary | ICD-10-CM

## 2020-08-21 DIAGNOSIS — E782 Mixed hyperlipidemia: Secondary | ICD-10-CM

## 2020-08-21 DIAGNOSIS — E039 Hypothyroidism, unspecified: Secondary | ICD-10-CM

## 2020-08-21 DIAGNOSIS — E890 Postprocedural hypothyroidism: Secondary | ICD-10-CM

## 2020-08-21 DIAGNOSIS — J45909 Unspecified asthma, uncomplicated: Secondary | ICD-10-CM

## 2020-08-21 DIAGNOSIS — Z87891 Personal history of nicotine dependence: Secondary | ICD-10-CM

## 2020-08-21 DIAGNOSIS — M0579 Rheumatoid arthritis with rheumatoid factor of multiple sites without organ or systems involvement: Secondary | ICD-10-CM

## 2020-08-21 DIAGNOSIS — I10 Essential (primary) hypertension: Secondary | ICD-10-CM | POA: Diagnosis not present

## 2020-08-21 DIAGNOSIS — M3500 Sicca syndrome, unspecified: Secondary | ICD-10-CM

## 2020-08-21 DIAGNOSIS — L732 Hidradenitis suppurativa: Secondary | ICD-10-CM

## 2020-08-21 DIAGNOSIS — E559 Vitamin D deficiency, unspecified: Secondary | ICD-10-CM

## 2020-08-21 DIAGNOSIS — R7309 Other abnormal glucose: Secondary | ICD-10-CM

## 2020-08-21 DIAGNOSIS — M51379 Other intervertebral disc degeneration, lumbosacral region without mention of lumbar back pain or lower extremity pain: Secondary | ICD-10-CM

## 2020-08-21 DIAGNOSIS — Z79899 Other long term (current) drug therapy: Secondary | ICD-10-CM

## 2020-08-21 DIAGNOSIS — M4722 Other spondylosis with radiculopathy, cervical region: Secondary | ICD-10-CM

## 2020-08-21 NOTE — Progress Notes (Signed)
FOLLOW UP 3 MONTH  Assessment:    Diagnoses and all orders for this visit:  Essential hypertension Continue medication Monitor blood pressure at home; call if consistently over 130/80 Continue DASH diet.   Reminder to go to the ER if any CP, SOB, nausea, dizziness, severe HA, changes vision/speech, left arm numbness and tingling and jaw pain.  Mixed hyperlipidemia Continue medications; rosuvastatin 20 mg daily, zeita 10 mg daily (new) - never tried 40 mg rosuvastatin; consider trial of rosuvastatin 40 and hold zetia until next visit  Continue low cholesterol diet and exercise.  Check lipid panel.   Atherosclerosis of aorta Per CT 06/2018 Control blood pressure, cholesterol, glucose, increase exercise.   Intrinsic asthma Stable on inhalers/meds  Sjogren's syndrome, with unspecified organ involvement ([place]) On DMARD, followed by rheumatology  Postoperative hypothyroidism continue medications the same pending lab results reminded to take on an empty stomach 30-58mins before food.  check TSH level  Cervical spondylosis with radiculopathy Currently fairly managed; followed by Dr. Dossie Der and Dr. Cyndy Freeze  Rheumatoid arthritis involving multiple sites, unspecified rheumatoid factor presence ([place]) Managed by Dr. Dossie Der, on DMARD  Degeneration of intervertebral disc of lumbosacral region Continue NSAID PRN, follows rheum  Vitamin D deficiency At goal at recent check; continue to recommend supplementation for goal of 70-100 Defer vitamin D level  Prediabetes Controlled on metformin for hx of severe fasting glucose elevation Discussed disease and risks Discussed diet/exercise, weight management  A1C q90m; defer  Morbid obesity ([place])  Long discussion about weight loss, diet, and exercise Recommended diet heavy in fruits and veggies and low in animal meats, cheeses, and dairy products, appropriate calorie intake Discussed appropriate weight for height and initial goal  (185lb) Follow up at next visit  Hidradenitis suppurativa Doxycycline, endo follows  Former smoker 30 pack year, quit 2015 -lung cancer screening with low dose CT discussed as recommended by guidelines based on age, number of pack year history.  Discussed risks of screening including but not limited to false positives on xray, further testing or consultation with specialist, and possible false negative CT as well. Understanding expressed and wishes to proceed with CT testing. Order placed.   Medication management Continued   Further disposition pending results if labs check today. Discussed med's effects and SE's.   Over 30 minutes of face to face interview, exam, counseling, chart review, and critical decision making was performed.    Future Appointments  Date Time Provider [place]  01/08/2021  2:30 PM Liane Comber, NP [facility] None  05/28/2021 10:00 AM Unk Pinto, MD [facility] None     HPI  Autumn Frye is a 59 y.o. female who presents for 3 month follow up on HTN, HLD, RA, OA, Hypothyroidism, asthma, prediabetes, vitamin D defciency and weight.   Patient is followed by Dr Dossie Der  for Rheumatoid Arthritis and Sjogren's since 2009. She is also on orencia/abatacept. Chronic neck/lumbar pain, has spinal stimulator and on buprenorphine patches via Dr. Vira Blanco.   Dr. Chalmers Cater with endocrinology following for hypothyroid and hidradenitis suppurativa. She has been on doxycycline 50 mg daily for many years as prophylaxis for this issue.  Asthma is controlled by singulaire daily, BREO PRN if has a cold. No recent flares.   BMI is Body mass index is 37.41 kg/m., she has been working on diet and exercise, going to the GYM (water aerobics with water weights) 3 days a week, reports scale hasn't moved but has noted pant size going down would like to get down  to 140 lb or so, cutting out starch and sugar.  Has been following with Dr. Gaylord Shih "Adapt your life" program  online via [facility], modified keto type diet 3 week plan, with greens, avocado, cruciferous veggies, plans to start this week.  Wt Readings from Last 3 Encounters:  08/21/20 198 lb (89.8 kg)  05/14/20 203 lb (92.1 kg)  03/06/20 199 lb (90.3 kg)   Former smoker, 30 pack year history, quit in 2015, will start low dose CT scans.  She has aortic atherosclerosis per abd CT 06/2018.  Her blood pressure has been controlled at home, today their BP is BP: 136/72 She does workout. She denies chest pain, shortness of breath, dizziness.   She is on cholesterol medication (rosuvastatin 20 mg daily, zetia 10 mg daily since last visit) and denies myalgias. Her LDL cholesterol is not at goal. The cholesterol last visit was:   Lab Results  Component Value Date   CHOL 160 05/14/2020   HDL 75 05/14/2020   LDLCALC 68 05/14/2020   TRIG 83 05/14/2020   CHOLHDL 2.1 05/14/2020    She has been working on diet and exercise for prediabetes which was controlled on metformin which recently stopped taking, and denies foot ulcerations, increased appetite, nausea, paresthesia of the feet, polydipsia, polyuria and visual disturbances. Last A1C in the office was:  Lab Results  Component Value Date   HGBA1C 5.4 05/14/2020   She is on thyroid medication. Her medication was not changed last visit, taking 137 mcg synthroid daily, recently stopped taking cytomel.  Lab Results  Component Value Date   TSH 0.84 05/14/2020   Last GFR: Lab Results  Component Value Date   GFRNONAA 101 05/14/2020   Patient is on Vitamin D supplement.   Lab Results  Component Value Date   VD25OH 53 05/14/2020      Medication Review: Current Outpatient Medications on File Prior to Visit  Medication Sig Dispense Refill  . Abatacept 125 MG/ML SOSY Inject 1 Syringe into the skin every 7 (seven) days.    Marland Kitchen acetaminophen (TYLENOL) 500 MG tablet Take 1 tablet (500 mg total) by mouth every 6 (six) hours as needed. 30 tablet 0  . Acetylcysteine  (N-ACETYL-L-CYSTEINE PO) Take 1 capsule by mouth daily.    Marland Kitchen albuterol (PROVENTIL) (2.5 MG/3ML) 0.083% nebulizer solution Take 3 mLs (2.5 mg total) by nebulization every 6 (six) hours as needed for wheezing or shortness of breath. 60 vial 1  . albuterol (VENTOLIN HFA) 108 (90 Base) MCG/ACT inhaler INHALE 2 PUFFS INTO THE LUNGS 15 MINUTES APART EVERY 4 HOURS AS NEEDED FOR ASTHMA 40.2 g 0  . amitriptyline (ELAVIL) 25 MG tablet Take 1 tablet every Morning & 3 tablets at Bedtime for Chronic Pain 360 tablet 3  . ASPIRIN 81 PO Take by mouth daily.    Marland Kitchen BREO ELLIPTA 200-25 MCG/INH AEPB INHALE 1 PUFF BY MOUTH INTO THE LUNGS DAILY( RINSE MOUTH AFTERWARDS) 60 each PRN  . buprenorphine (BUTRANS) 10 MCG/HR PTWK Place 15 mcg onto the skin once a week.     . Cholecalciferol (VITAMIN D3) 5000 UNITS TABS Take 10,000 Units by mouth daily.     Marland Kitchen CRANBERRY PO Take by mouth daily.    . diclofenac Sodium (VOLTAREN) 1 % GEL diclofenac 1 % topical gel  APP 2 GRAMS TOPICALLY AA TWO TO TID    . doxycycline (VIBRAMYCIN) 50 MG capsule with lunch.    . Eyelid Cleansers (AVENOVA) 0.01 % SOLN Place 1 application into both  eyes 2 (two) times daily. Apply to eyelids BID 1 Bottle 1  . ezetimibe (ZETIA) 10 MG tablet TAKE 1 TABLET BY MOUTH DAILY FOR CHOLESTEROL 90 tablet 1  . FEMRING 0.1 MG/24HR RING INSERT 1 RING VAGINALLY EVERY 3 MONTHS 1 each 3  . fexofenadine (ALLEGRA) 180 MG tablet Take 180 mg by mouth daily.    . furosemide (LASIX) 40 MG tablet Take     1 tablet     3 x /day      for Fluid Retention / Ankle Swelling 270 tablet 0  . L-GLUTAMINE PO Take 1 capsule by mouth daily.    Marland Kitchen l-methylfolate-B6-B12 (METANX) 3-35-2 MG TABS tablet Take 1 tablet by mouth daily.    . Levomefolate Glucosamine (METHYLFOLATE PO) Take 5,000 mg by mouth daily.    Marland Kitchen LOTEMAX 0.5 % GEL INSTILL 1 DROP IN BOTH EYES THREE TIMES DAILY 5 g 0  . Magnesium Oxide 500 MG TABS Take 500 mg by mouth daily.    . Menaquinone-7 (VITAMIN K2) 100 MCG CAPS Take 1  capsule by mouth daily.    Marland Kitchen MILK THISTLE EXTRACT PO Take 2 capsules by mouth daily.    . montelukast (SINGULAIR) 10 MG tablet TAKE 1 TABLET BY MOUTH DAILY FOR ALLERGIES 90 tablet 3  . mupirocin ointment (BACTROBAN) 2 % APP EXT AA TID FOR 10 DAYS 22 g 3  . Omega-3 Fatty Acids (FISH OIL) 1200 MG CAPS Take 2 capsules by mouth daily.     . ondansetron (ZOFRAN) 8 MG tablet Take 1/2 to 1 tablet 2 to 3 x /day as needed for Nausea 30 tablet 0  . Polyethyl Glycol-Propyl Glycol 0.4-0.3 % SOLN Place 1 drop into both eyes 4 (four) times daily.     . RESTASIS 0.05 % ophthalmic emulsion Place 1 drop into both eyes daily.   3  . rosuvastatin (CRESTOR) 40 MG tablet Take 1 tablet     Daily       for Cholesterol 90 tablet 0  . SYNTHROID 137 MCG tablet Take 1/2 tab 1 day a week, whole tab other days. Take first thing in the morning on an empty stomach with water only. No other drinks/food/meds for 30-60 min. 90 tablet 1  . triamcinolone (NASACORT) 55 MCG/ACT AERO nasal inhaler 1 spray by Both Nostrils route daily.    . TURMERIC CURCUMIN PO Take 900 mg elemental calcium/kg/hr by mouth 2 (two) times daily.     No current facility-administered medications on file prior to visit.    Allergies  Allergen Reactions  . Ciprofloxacin Hives and Swelling    SWELLING REACTION UNSPECIFIED   . Levaquin [Levofloxacin] Hives  . Lyrica [Pregabalin] Other (See Comments)    Altered mental status ALTERED MENTAL STATUS HALLUCINATIONS  . Compazine [Prochlorperazine Edisylate]     UNSPECIFIED REACTION   . Hydrocodone Itching  . Morphine And Related Itching  . Penicillins Itching  . Sulfa Antibiotics Itching    Current Problems (verified) Patient Active Problem List   Diagnosis Date Noted  . Insulin resistance 05/12/2020  . Former smoker, 30 pack year, quit 2015 01/09/2020  . Aortic atherosclerosis ([place]) 01/08/2020  . History of COVID-19 (11 Oct 2019)  10/12/2019  . Hidradenitis suppurativa 05/16/2019  . Morbid  obesity ([place]) 11/08/2017  . Abnormal glucose 08/02/2017  . Cervical spondylosis with radiculopathy 05/30/2017  . Rheumatoid arthritis involving multiple sites with positive rheumatoid factor ([place]) 11/14/2015  . Encounter for Medicare annual wellness exam 04/24/2015  . Sjogren's disease ([place])  07/05/2014  . Medication management 03/26/2014  . Hyperlipidemia, mixed 09/24/2013  . Vitamin D deficiency 09/24/2013  . Intrinsic asthma   . Essential hypertension   . Hypothyroidism   . Degeneration of intervertebral disc of lumbosacral region 05/23/2008    Screening Tests Immunization History  Administered Date(s) Administered  . DTaP 09/15/2012, 09/15/2012  . Influenza Inj Mdck Quad With Preservative 08/02/2017  . Influenza Whole 09/06/2012  . Influenza-Unspecified 05/07/2014, 05/20/2016, 08/02/2017  . Pneumococcal Conjugate-13 09/15/2012, 07/05/2014  . Zoster 09/03/2013    Colonoscopy: 2013 - 10 year follow up MGM: 06/2020 Stress: 2004 DEXA: 2013 Echo: 2004 CXr: 02/2016 PAP: reports had in 01/2019 by GYN  Former smoker, 30 pack year hx, quit 2015 Ct chest: ordered today   Tdap: 2014 Flu vaccine: 2018 Covid 19: declines   All other vaccines UTD  Names of Other Physician/Practitioners you currently use: 1. [facility] here for primary care 2. [place], eye doctor, last visit 2020 - mild cataracts 3. [facility], dentist, last visit 2020, goes q7m, due    Patient Care Team: Unk Pinto, MD as PCP - General (Internal Medicine) Ladene Artist, MD as Consulting Physician (Gastroenterology) Newt Minion, MD as Consulting Physician (Orthopedic Surgery) Unice Bailey, MD as Consulting Physician (Rheumatology) Karie Chimera, MD as Consulting Physician (Neurosurgery) Druscilla Brownie, MD as Consulting Physician (Dermatology) Druscilla Brownie, MD as Consulting Physician (Dermatology)  SURGICAL HISTORY She  has a past  surgical history that includes Thyroidectomy; Upper gastrointestinal endoscopy; Total knee arthroplasty; Vaginal hysterectomy; Knee surgery; Pilonidal cyst excision; Tonsillectomy; Anterior lat lumbar fusion (Left, 01/26/2013); Lumbar percutaneous pedicle screw 1 level (Left, 01/26/2013); Knee Arthroplasty (Left, 02/16/2016); and Anterior cervical decomp/discectomy fusion (N/A, 05/30/2017).   FAMILY HISTORY Her family history includes Asthma in her brother; Breast cancer (age of onset: 69) in her maternal grandmother; Cancer in her maternal grandmother; Diabetes in her brother; Hypertension in her father; Other in her daughter; Stroke in her father.   SOCIAL HISTORY She  reports that she quit smoking about 5 years ago. Her smoking use included cigarettes. She has a 30.00 pack-year smoking history. She has never used smokeless tobacco. She reports current alcohol use. She reports that she does not use drugs.   Review of Systems  Constitutional: Negative for malaise/fatigue and weight loss.  HENT: Negative for hearing loss and tinnitus.   Eyes: Negative for blurred vision and double vision.  Respiratory: Negative for cough, sputum production, shortness of breath and wheezing.   Cardiovascular: Negative for chest pain, palpitations, orthopnea, claudication, leg swelling and PND.  Gastrointestinal: Negative for abdominal pain, blood in stool, constipation, diarrhea, heartburn, melena, nausea and vomiting.  Genitourinary: Negative.   Musculoskeletal: Positive for joint pain (wrists, follows rheumatology). Negative for falls and myalgias.  Skin: Negative for rash.  Neurological: Negative for dizziness, tingling, sensory change, weakness and headaches.  Endo/Heme/Allergies: Negative for polydipsia.  Psychiatric/Behavioral: Negative.  Negative for depression, memory loss, substance abuse and suicidal ideas. The patient is not nervous/anxious and does not have insomnia.   All other systems reviewed and are  negative.    Objective:     Today's Vitals   08/21/20 1128  BP: 136/72  Pulse: 63  Temp: 97.7 F (36.5 C)  SpO2: 96%  Weight: 198 lb (89.8 kg)  Height: 5\' 1"  (1.549 m)   Body mass index is 37.41 kg/m.  General Appearance: Well nourished, in no apparent distress. Eyes: PERRLA, EOMs, conjunctiva no swelling or erythema Sinuses: No Frontal/maxillary  tenderness ENT/Mouth: Ext aud canals clear, TMs without erythema, bulging. No erythema, swelling, or exudate on post pharynx.  Tonsils not swollen or erythematous. Hearing normal.  Neck: Supple, thyroid normal.  Respiratory: Respiratory effort normal, BS equal bilaterally without rales, rhonchi, wheezing or stridor.  Cardio: RRR with no MRGs. Brisk peripheral pulses without edema.  Abdomen: Soft, + BS.  Non tender, no guarding, rebound, hernias, masses. Lymphatics: Non tender without lymphadenopathy.  Musculoskeletal: Full ROM, 5/5 strength, Mildly antalgic gait. R wrist with some warmth; non-erythematous, no obvious deformity Skin: Warm, dry; intact without rashes, lesions, ecchynosis Neuro: Cranial nerves intact. No cerebellar symptoms.  Psych: Awake and oriented X 3, normal affect, Insight and Judgment appropriate.     Autumn Frye, Laqueta Jean, DNP [facility] 08/21/2020  1:53 PM

## 2020-08-22 LAB — CBC WITH DIFFERENTIAL/PLATELET
Absolute Monocytes: 270 cells/uL (ref 200–950)
Basophils Absolute: 21 cells/uL (ref 0–200)
Basophils Relative: 0.4 %
Eosinophils Absolute: 10 cells/uL — ABNORMAL LOW (ref 15–500)
Eosinophils Relative: 0.2 %
HCT: 40.6 % (ref 35.0–45.0)
Hemoglobin: 13.2 g/dL (ref 11.7–15.5)
Lymphs Abs: 2824 cells/uL (ref 850–3900)
MCH: 28.1 pg (ref 27.0–33.0)
MCHC: 32.5 g/dL (ref 32.0–36.0)
MCV: 86.4 fL (ref 80.0–100.0)
MPV: 10 fL (ref 7.5–12.5)
Monocytes Relative: 5.2 %
Neutro Abs: 2075 cells/uL (ref 1500–7800)
Neutrophils Relative %: 39.9 %
Platelets: 338 10*3/uL (ref 140–400)
RBC: 4.7 10*6/uL (ref 3.80–5.10)
RDW: 13.3 % (ref 11.0–15.0)
Total Lymphocyte: 54.3 %
WBC: 5.2 10*3/uL (ref 3.8–10.8)

## 2020-08-22 LAB — LIPID PANEL
Cholesterol: 187 mg/dL (ref <200)
HDL: 72 mg/dL (ref >=50)
LDL Cholesterol (Calc): 98 mg/dL (calc)
Non-HDL Cholesterol (Calc): 115 mg/dL (calc) (ref <130)
Total CHOL/HDL Ratio: 2.6 (calc) (ref <5.0)
Triglycerides: 77 mg/dL (ref <150)

## 2020-08-22 LAB — COMPLETE METABOLIC PANEL WITH GFR
AG Ratio: 2 (calc) (ref 1.0–2.5)
ALT: 24 U/L (ref 6–29)
AST: 24 U/L (ref 10–35)
Albumin: 4.2 g/dL (ref 3.6–5.1)
Alkaline phosphatase (APISO): 53 U/L (ref 37–153)
BUN: 12 mg/dL (ref 7–25)
CO2: 31 mmol/L (ref 20–32)
Calcium: 9.4 mg/dL (ref 8.6–10.4)
Chloride: 102 mmol/L (ref 98–110)
Creat: 0.56 mg/dL (ref 0.50–1.05)
GFR, Est African American: 118 mL/min/{1.73_m2} (ref >=60)
GFR, Est Non African American: 102 mL/min/{1.73_m2} (ref >=60)
Globulin: 2.1 g/dL (calc) (ref 1.9–3.7)
Glucose, Bld: 90 mg/dL (ref 65–99)
Potassium: 4.2 mmol/L (ref 3.5–5.3)
Sodium: 138 mmol/L (ref 135–146)
Total Bilirubin: 0.5 mg/dL (ref 0.2–1.2)
Total Protein: 6.3 g/dL (ref 6.1–8.1)

## 2020-08-22 LAB — TSH: TSH: 1.55 mIU/L (ref 0.40–4.50)

## 2020-09-01 DIAGNOSIS — E119 Type 2 diabetes mellitus without complications: Secondary | ICD-10-CM | POA: Diagnosis not present

## 2020-09-01 DIAGNOSIS — H02889 Meibomian gland dysfunction of unspecified eye, unspecified eyelid: Secondary | ICD-10-CM | POA: Diagnosis not present

## 2020-09-01 DIAGNOSIS — M3501 Sicca syndrome with keratoconjunctivitis: Secondary | ICD-10-CM | POA: Diagnosis not present

## 2020-09-01 DIAGNOSIS — H2513 Age-related nuclear cataract, bilateral: Secondary | ICD-10-CM | POA: Diagnosis not present

## 2020-09-01 DIAGNOSIS — H04123 Dry eye syndrome of bilateral lacrimal glands: Secondary | ICD-10-CM | POA: Diagnosis not present

## 2020-09-06 ENCOUNTER — Other Ambulatory Visit: Payer: Self-pay | Admitting: Internal Medicine

## 2020-09-06 MED ORDER — AMITRIPTYLINE HCL 25 MG PO TABS: ORAL_TABLET | ORAL | 3 refills | Status: DC

## 2020-10-01 DIAGNOSIS — M25539 Pain in unspecified wrist: Secondary | ICD-10-CM | POA: Diagnosis not present

## 2020-10-01 DIAGNOSIS — E039 Hypothyroidism, unspecified: Secondary | ICD-10-CM | POA: Diagnosis not present

## 2020-10-01 DIAGNOSIS — M797 Fibromyalgia: Secondary | ICD-10-CM | POA: Diagnosis not present

## 2020-10-01 DIAGNOSIS — Z79899 Other long term (current) drug therapy: Secondary | ICD-10-CM | POA: Diagnosis not present

## 2020-10-01 DIAGNOSIS — M1990 Unspecified osteoarthritis, unspecified site: Secondary | ICD-10-CM | POA: Diagnosis not present

## 2020-10-01 DIAGNOSIS — M722 Plantar fascial fibromatosis: Secondary | ICD-10-CM | POA: Diagnosis not present

## 2020-10-01 DIAGNOSIS — M0609 Rheumatoid arthritis without rheumatoid factor, multiple sites: Secondary | ICD-10-CM | POA: Diagnosis not present

## 2020-10-01 DIAGNOSIS — M549 Dorsalgia, unspecified: Secondary | ICD-10-CM | POA: Diagnosis not present

## 2020-10-03 DIAGNOSIS — E890 Postprocedural hypothyroidism: Secondary | ICD-10-CM | POA: Diagnosis not present

## 2020-10-03 DIAGNOSIS — R7301 Impaired fasting glucose: Secondary | ICD-10-CM | POA: Diagnosis not present

## 2020-10-07 ENCOUNTER — Other Ambulatory Visit: Payer: Self-pay | Admitting: Internal Medicine

## 2020-10-07 ENCOUNTER — Other Ambulatory Visit: Payer: Self-pay | Admitting: Adult Health

## 2020-10-07 MED ORDER — BISOPROLOL FUMARATE 5 MG PO TABS: ORAL_TABLET | ORAL | 0 refills | Status: DC

## 2020-10-15 ENCOUNTER — Other Ambulatory Visit: Payer: Self-pay

## 2020-10-15 ENCOUNTER — Ambulatory Visit (INDEPENDENT_AMBULATORY_CARE_PROVIDER_SITE_OTHER): Payer: Medicare Other | Admitting: Adult Health

## 2020-10-15 ENCOUNTER — Encounter: Payer: Self-pay | Admitting: Adult Health

## 2020-10-15 VITALS — BP 118/74 | HR 60 | Temp 97.3°F | Wt 202.0 lb

## 2020-10-15 DIAGNOSIS — I10 Essential (primary) hypertension: Secondary | ICD-10-CM

## 2020-10-15 DIAGNOSIS — R42 Dizziness and giddiness: Secondary | ICD-10-CM | POA: Diagnosis not present

## 2020-10-15 DIAGNOSIS — Z87891 Personal history of nicotine dependence: Secondary | ICD-10-CM | POA: Diagnosis not present

## 2020-10-15 DIAGNOSIS — R5383 Other fatigue: Secondary | ICD-10-CM | POA: Diagnosis not present

## 2020-10-15 DIAGNOSIS — H8110 Benign paroxysmal vertigo, unspecified ear: Secondary | ICD-10-CM

## 2020-10-15 DIAGNOSIS — Z8249 Family history of ischemic heart disease and other diseases of the circulatory system: Secondary | ICD-10-CM

## 2020-10-15 MED ORDER — MECLIZINE HCL 25 MG PO TABS: ORAL_TABLET | ORAL | 0 refills | Status: DC

## 2020-10-15 NOTE — Progress Notes (Signed)
Assessment and Plan:  Autumn Frye was seen today for hypertension.  Diagnoses and all orders for this visit:  Fatigue, unspecified type Vague, nonspecific Dr. Chalmers Cater has recently adjusted synthroid for high TSH - this may help resolve - will not recheck  Check CBC for acute anemia,  Check basic labs Her main concern is r/o MI due to family history and her smoking history, non cardiac CT did show LAD and left circumflex atherosclerosis recently She did have low risk stress test 2004 Consider cardiology referral or coronary calcium CT Denies ongoing angina, dyspnea, accompaniments Just fatigue;  -     CBC with Differential/Platelet -     COMPLETE METABOLIC PANEL WITH GFR -     EKG 12-Lead - sinus brady, no ST changes, no pathologic q waves  Essential hypertension Well controlled; continue medications Monitor blood pressure at home; call if consistently over 130/80 Continue DASH diet.   Reminder to go to the ER if any CP, SOB, nausea, dizziness, severe HA, changes vision/speech, left arm numbness and tingling and jaw pain. -     EKG 12-Lead  Dizziness Appears multifactorial; does have hx of BPPV elements of HPI and exam support, will refill meclizine However smoking history, also lightheaded with neck extension and position changes Get on bASA, will check carotid US Neuro otherwise normal today Will go to the ER if abrupt headache, changes vision/speech, imbalance, weakness.  Benign paroxysmal positional vertigo, unspecified laterality Per persistent and limiting can refer to PT or ENT -     meclizine (ANTIVERT) 25 MG tablet; 1/2-1 pill up to 3 times daily for motion sickness/dizziness  Dizziness after extension of neck -     VAS US CAROTID; Future  Former smoker, 30 pack year, quit 2015 -     VAS US CAROTID; Future  Further disposition pending results of labs. Discussed med's effects and SE's.   Over 30 minutes of exam, counseling, chart review, and critical decision making was  performed.   Future Appointments  Date Time Provider [place]  01/08/2021  2:30 PM Liane Comber, NP [facility] None  05/28/2021 10:00 AM Unk Pinto, MD [facility] None    ------------------------------------------------------------------------------------------------------------------   HPI BP 118/74   Pulse 60   Temp (!) 97.3 F (36.3 C)   Wt 202 lb (91.6 kg)   LMP  (LMP Unknown)   SpO2 96%   BMI 38.17 kg/m   60 y.o.female former smoker with hx of htn, hyperlipidemia presents for evaluation of BP abnormality   Has been having several months of dizziness with position changes sitting to standing, with looking up in to cabinets would cause lightheaded sensation "like cotton in my head", also rolling over in bed causing room spinning.   She has been on lasix 40 mg daily for many years, hasn't needed to check BP at home as has been well controlled for many years.   She reports 3 weeks ago had episode while she was getting ready to go to bed, suddenly felt nauseous, diaphoretic, felt unwell, and threw up suddenly. Today she recalls may have had some chest heaviness, but not severe, but did note bilateral burning pain in bilateral arms, arms felt very heavy and hot. This lasted 5 min. She lay down and gradually resolved. Went to sleep. She reports since that time has had persistent fatigue, but attributed to her RA. Today thinks this has been more severe. Also felt bounding sensation in her chest, even when sitting and watching TV.   She decided to  start checking BP at home in Feb, noted atypically elevated diagnoslic, log showes ranging 130-140s/90s, which is atypical, contacted office and Dr. Melford Aase initiated bisoprolol 5 mg. BPs improved, less bounding sensation in her chest, BP within normal range, but does continue to have dizziness/lightheaded episodes and fatigued.   Today their BP is BP: 118/74  She does workout, water aerobics. She denies chest pain, shortness of  breath, dizziness. Just some fatigue.   She does have smoking history - she has estimated 30-40 pack year history, quit 2016.   Low risk exercise stress test 01/28/2003. She does have family hx, MI in father, PGF. Aortic atherosclerosis, left circumflex and LAD coronary calcium from 01/2020 non-coronary chest CT for lung cancer screening. Concerned about r/o possible MI.   She has hypothyroid; since last check reports had TSH by Dr. Chalmers Cater and levothyroxine dose was just increased.  Lab Results  Component Value Date   TSH 1.55 08/21/2020   Lipids have been controlled by medications but historically poorly controlled Lab Results  Component Value Date   CHOL 187 08/21/2020   HDL 72 08/21/2020   LDLCALC 98 08/21/2020   TRIG 77 08/21/2020   CHOLHDL 2.6 08/21/2020      Past Medical History:  Diagnosis Date  . Arthritis    OA, RA  . Asthma    with a cold  . Autoimmune deficiency syndrome ([place])   . Back pain   . Cancer ([place])    thyroid-1995  . Family history of adverse reaction to anesthesia    mother problems with n/v  . Fibromyalgia   . GERD (gastroesophageal reflux disease)   . Hepatitis    'A" when pt. was 12 yrs. old  . Hiatal hernia   . Hyperlipidemia   . Hypertension   . Hypothyroidism   . Pneumonia      Allergies  Allergen Reactions  . Ciprofloxacin Hives and Swelling    SWELLING REACTION UNSPECIFIED   . Levaquin [Levofloxacin] Hives  . Lyrica [Pregabalin] Other (See Comments)    Altered mental status ALTERED MENTAL STATUS HALLUCINATIONS  . Compazine [Prochlorperazine Edisylate]     UNSPECIFIED REACTION   . Hydrocodone Itching  . Morphine And Related Itching  . Penicillins Itching  . Sulfa Antibiotics Itching    Current Outpatient Medications on File Prior to Visit  Medication Sig  . Abatacept 125 MG/ML SOSY Inject 1 Syringe into the skin every 7 (seven) days.  Marland Kitchen acetaminophen (TYLENOL) 500 MG tablet Take 1 tablet (500 mg total) by mouth every 6 (six)  hours as needed.  . Acetylcysteine (N-ACETYL-L-CYSTEINE PO) Take 1 capsule by mouth daily.  Marland Kitchen albuterol (PROVENTIL) (2.5 MG/3ML) 0.083% nebulizer solution Take 3 mLs (2.5 mg total) by nebulization every 6 (six) hours as needed for wheezing or shortness of breath.  Marland Kitchen albuterol (VENTOLIN HFA) 108 (90 Base) MCG/ACT inhaler INHALE 2 PUFFS INTO THE LUNGS 15 MINUTES APART EVERY 4 HOURS AS NEEDED FOR ASTHMA  . amitriptyline (ELAVIL) 25 MG tablet Take 1 tablet every Morning & 3 tablets at Bedtime for Chronic Pain  . ASPIRIN 81 PO Take by mouth daily.  . bisoprolol (ZEBETA) 5 MG tablet Take  1 tablet  every Morning  for BP  . BREO ELLIPTA 200-25 MCG/INH AEPB INHALE 1 PUFF BY MOUTH INTO THE LUNGS DAILY( RINSE MOUTH AFTERWARDS)  . buprenorphine (BUTRANS) 10 MCG/HR PTWK Place 15 mcg onto the skin once a week.   . Cholecalciferol (VITAMIN D3) 5000 UNITS TABS Take 10,000 Units by  mouth daily.   Marland Kitchen CRANBERRY PO Take by mouth daily.  . diclofenac Sodium (VOLTAREN) 1 % GEL diclofenac 1 % topical gel  APP 2 GRAMS TOPICALLY AA TWO TO TID  . doxycycline (VIBRAMYCIN) 50 MG capsule with lunch.  . Eyelid Cleansers (AVENOVA) 0.01 % SOLN Place 1 application into both eyes 2 (two) times daily. Apply to eyelids BID  . ezetimibe (ZETIA) 10 MG tablet TAKE 1 TABLET BY MOUTH DAILY FOR CHOLESTEROL  . FEMRING 0.1 MG/24HR RING INSERT 1 RING VAGINALLY EVERY 3 MONTHS  . fexofenadine (ALLEGRA) 180 MG tablet Take 180 mg by mouth daily.  . furosemide (LASIX) 40 MG tablet Take     1 tablet     3 x /day      for Fluid Retention / Ankle Swelling  . L-GLUTAMINE PO Take 1 capsule by mouth daily.  Marland Kitchen l-methylfolate-B6-B12 (METANX) 3-35-2 MG TABS tablet Take 1 tablet by mouth daily.  . Levomefolate Glucosamine (METHYLFOLATE PO) Take 5,000 mg by mouth daily.  Marland Kitchen LOTEMAX 0.5 % GEL INSTILL 1 DROP IN BOTH EYES THREE TIMES DAILY  . Magnesium Oxide 500 MG TABS Take 500 mg by mouth daily.  . Menaquinone-7 (VITAMIN K2) 100 MCG CAPS Take 1 capsule  by mouth daily.  Marland Kitchen MILK THISTLE EXTRACT PO Take 2 capsules by mouth daily.  . montelukast (SINGULAIR) 10 MG tablet TAKE 1 TABLET BY MOUTH DAILY FOR ALLERGIES  . mupirocin ointment (BACTROBAN) 2 % APP EXT AA TID FOR 10 DAYS  . Omega-3 Fatty Acids (FISH OIL) 1200 MG CAPS Take 2 capsules by mouth daily.   . ondansetron (ZOFRAN) 8 MG tablet Take 1/2 to 1 tablet 2 to 3 x /day as needed for Nausea  . Polyethyl Glycol-Propyl Glycol 0.4-0.3 % SOLN Place 1 drop into both eyes 4 (four) times daily.   . RESTASIS 0.05 % ophthalmic emulsion Place 1 drop into both eyes daily.   . rosuvastatin (CRESTOR) 40 MG tablet Take 1 tablet     Daily       for Cholesterol  . SYNTHROID 137 MCG tablet Take 1/2 tab 1 day a week, whole tab other days. Take first thing in the morning on an empty stomach with water only. No other drinks/food/meds for 30-60 min.  . triamcinolone (NASACORT) 55 MCG/ACT AERO nasal inhaler 1 spray by Both Nostrils route daily.  . TURMERIC CURCUMIN PO Take 900 mg elemental calcium/kg/hr by mouth 2 (two) times daily.   No current facility-administered medications on file prior to visit.   Allergies:  Allergies  Allergen Reactions  . Ciprofloxacin Hives and Swelling    SWELLING REACTION UNSPECIFIED   . Levaquin [Levofloxacin] Hives  . Lyrica [Pregabalin] Other (See Comments)    Altered mental status ALTERED MENTAL STATUS HALLUCINATIONS  . Compazine [Prochlorperazine Edisylate]     UNSPECIFIED REACTION   . Hydrocodone Itching  . Morphine And Related Itching  . Penicillins Itching  . Sulfa Antibiotics Itching   Surgical History:  She  has a past surgical history that includes Thyroidectomy; Upper gastrointestinal endoscopy; Total knee arthroplasty; Vaginal hysterectomy; Knee surgery; Pilonidal cyst excision; Tonsillectomy; Anterior lat lumbar fusion (Left, 01/26/2013); Lumbar percutaneous pedicle screw 1 level (Left, 01/26/2013); Knee Arthroplasty (Left, 02/16/2016); and Anterior cervical  decomp/discectomy fusion (N/A, 05/30/2017). Family History:  Herfamily history includes Alzheimer's disease in her paternal grandfather; Asthma in her brother; Breast cancer (age of onset: 40) in her maternal grandmother; Diabetes in her brother; Heart attack in her paternal grandfather; Heart attack (age of  onset: 50) in her father; Hypertension in her father and maternal grandmother; Lung cancer (age of onset: 90) in her father; Other in her daughter; Stroke in her paternal grandmother; Stroke (age of onset: 23) in her father. Social History:   reports that she quit smoking about 5 years ago. Her smoking use included cigarettes. She has a 30.00 pack-year smoking history. She has never used smokeless tobacco. She reports current alcohol use. She reports that she does not use drugs.   ROS: all negative except above.   Physical Exam:  BP 118/74   Pulse 60   Temp (!) 97.3 F (36.3 C)   Wt 202 lb (91.6 kg)   LMP  (LMP Unknown)   SpO2 96%   BMI 38.17 kg/m   General Appearance: Well nourished, in no apparent distress. Eyes: PERRLA, EOMs, conjunctiva no swelling or erythema Sinuses: No Frontal/maxillary tenderness ENT/Mouth: Ext aud canals clear, TMs without erythema, bulging. No erythema, swelling, or exudate on post pharynx.  Tonsils not swollen or erythematous. Hearing normal. Abrupt dizziness with rapid head motion.  Neck: Supple, thyroid normal. No carotid bruits. Does lightheaded with neck extension.  Respiratory: Respiratory effort normal, BS equal bilaterally without rales, rhonchi, wheezing or stridor.  Cardio: RRR with no MRGs. Brisk peripheral pulses without edema.  Abdomen: Soft, + BS.  Mild epigastric tenderness, no guarding, rebound, hernias, masses. Lymphatics: Non tender without lymphadenopathy.  Musculoskeletal: Full ROM, 5/5 strength Skin: Warm, dry without rashes, lesions, ecchymosis.  Neuro: Cranial nerves intact. Normal muscle tone, no cerebellar symptoms. Sensation  intact. Steady gait. Neg pronator drift.  Psych: Awake and oriented X 3, normal affect, Insight and Judgment appropriate.     Izora Ribas, NP 11:44 AM [facility]

## 2020-10-16 ENCOUNTER — Other Ambulatory Visit: Payer: Self-pay | Admitting: Adult Health

## 2020-10-16 DIAGNOSIS — Z8249 Family history of ischemic heart disease and other diseases of the circulatory system: Secondary | ICD-10-CM

## 2020-10-16 LAB — COMPLETE METABOLIC PANEL WITH GFR
AG Ratio: 1.8 (calc) (ref 1.0–2.5)
ALT: 21 U/L (ref 6–29)
AST: 19 U/L (ref 10–35)
Albumin: 4.1 g/dL (ref 3.6–5.1)
Alkaline phosphatase (APISO): 60 U/L (ref 37–153)
BUN: 14 mg/dL (ref 7–25)
CO2: 29 mmol/L (ref 20–32)
Calcium: 9.1 mg/dL (ref 8.6–10.4)
Chloride: 101 mmol/L (ref 98–110)
Creat: 0.59 mg/dL (ref 0.50–1.05)
GFR, Est African American: 116 mL/min/{1.73_m2} (ref >=60)
GFR, Est Non African American: 100 mL/min/{1.73_m2} (ref >=60)
Globulin: 2.3 g/dL (calc) (ref 1.9–3.7)
Glucose, Bld: 82 mg/dL (ref 65–99)
Potassium: 4.1 mmol/L (ref 3.5–5.3)
Sodium: 137 mmol/L (ref 135–146)
Total Bilirubin: 0.3 mg/dL (ref 0.2–1.2)
Total Protein: 6.4 g/dL (ref 6.1–8.1)

## 2020-10-16 LAB — CBC WITH DIFFERENTIAL/PLATELET
Absolute Monocytes: 314 cells/uL (ref 200–950)
Basophils Absolute: 19 cells/uL (ref 0–200)
Basophils Relative: 0.3 %
Eosinophils Absolute: 32 cells/uL (ref 15–500)
Eosinophils Relative: 0.5 %
HCT: 38.3 % (ref 35.0–45.0)
Hemoglobin: 12.6 g/dL (ref 11.7–15.5)
Lymphs Abs: 3072 cells/uL (ref 850–3900)
MCH: 29.1 pg (ref 27.0–33.0)
MCHC: 32.9 g/dL (ref 32.0–36.0)
MCV: 88.5 fL (ref 80.0–100.0)
MPV: 10.2 fL (ref 7.5–12.5)
Monocytes Relative: 4.9 %
Neutro Abs: 2963 cells/uL (ref 1500–7800)
Neutrophils Relative %: 46.3 %
Platelets: 291 10*3/uL (ref 140–400)
RBC: 4.33 10*6/uL (ref 3.80–5.10)
RDW: 13.3 % (ref 11.0–15.0)
Total Lymphocyte: 48 %
WBC: 6.4 10*3/uL (ref 3.8–10.8)

## 2020-10-24 ENCOUNTER — Ambulatory Visit (HOSPITAL_COMMUNITY)
Admission: RE | Admit: 2020-10-24 | Discharge: 2020-10-24 | Disposition: A | Discharge status: Home / Self Care | Payer: Medicare Other | Source: Ambulatory Visit | Attending: Cardiology | Admitting: Cardiology

## 2020-10-24 ENCOUNTER — Other Ambulatory Visit: Payer: Self-pay

## 2020-10-24 DIAGNOSIS — R42 Dizziness and giddiness: Secondary | ICD-10-CM | POA: Diagnosis not present

## 2020-10-24 DIAGNOSIS — Z87891 Personal history of nicotine dependence: Secondary | ICD-10-CM | POA: Insufficient documentation

## 2020-10-25 ENCOUNTER — Encounter: Payer: Self-pay | Admitting: Adult Health

## 2020-10-25 DIAGNOSIS — I6529 Occlusion and stenosis of unspecified carotid artery: Secondary | ICD-10-CM | POA: Insufficient documentation

## 2020-11-05 ENCOUNTER — Other Ambulatory Visit: Payer: Self-pay | Admitting: Internal Medicine

## 2020-11-05 DIAGNOSIS — M961 Postlaminectomy syndrome, not elsewhere classified: Secondary | ICD-10-CM | POA: Diagnosis not present

## 2020-11-05 DIAGNOSIS — M47817 Spondylosis without myelopathy or radiculopathy, lumbosacral region: Secondary | ICD-10-CM | POA: Diagnosis not present

## 2020-11-05 DIAGNOSIS — Z79891 Long term (current) use of opiate analgesic: Secondary | ICD-10-CM | POA: Diagnosis not present

## 2020-11-05 DIAGNOSIS — Z79899 Other long term (current) drug therapy: Secondary | ICD-10-CM | POA: Diagnosis not present

## 2020-11-05 DIAGNOSIS — G894 Chronic pain syndrome: Secondary | ICD-10-CM | POA: Diagnosis not present

## 2020-11-05 DIAGNOSIS — E782 Mixed hyperlipidemia: Secondary | ICD-10-CM

## 2020-11-05 DIAGNOSIS — M069 Rheumatoid arthritis, unspecified: Secondary | ICD-10-CM | POA: Diagnosis not present

## 2020-11-17 ENCOUNTER — Other Ambulatory Visit: Payer: Self-pay | Admitting: Internal Medicine

## 2020-11-17 MED ORDER — OLMESARTAN MEDOXOMIL 20 MG PO TABS: ORAL_TABLET | ORAL | 1 refills | Status: DC

## 2020-11-20 ENCOUNTER — Other Ambulatory Visit: Payer: Self-pay | Admitting: Internal Medicine

## 2020-11-20 DIAGNOSIS — Z79899 Other long term (current) drug therapy: Secondary | ICD-10-CM

## 2020-11-26 ENCOUNTER — Other Ambulatory Visit: Payer: Self-pay | Admitting: Adult Health Nurse Practitioner

## 2020-11-26 DIAGNOSIS — J45909 Unspecified asthma, uncomplicated: Secondary | ICD-10-CM

## 2020-12-03 ENCOUNTER — Encounter: Payer: Self-pay | Admitting: Adult Health

## 2020-12-03 ENCOUNTER — Other Ambulatory Visit: Payer: Self-pay | Admitting: Adult Health

## 2020-12-03 DIAGNOSIS — M47817 Spondylosis without myelopathy or radiculopathy, lumbosacral region: Secondary | ICD-10-CM | POA: Diagnosis not present

## 2020-12-03 DIAGNOSIS — I2510 Atherosclerotic heart disease of native coronary artery without angina pectoris: Secondary | ICD-10-CM | POA: Insufficient documentation

## 2020-12-03 DIAGNOSIS — M961 Postlaminectomy syndrome, not elsewhere classified: Secondary | ICD-10-CM | POA: Diagnosis not present

## 2020-12-03 DIAGNOSIS — Z8249 Family history of ischemic heart disease and other diseases of the circulatory system: Secondary | ICD-10-CM

## 2020-12-03 DIAGNOSIS — G894 Chronic pain syndrome: Secondary | ICD-10-CM | POA: Diagnosis not present

## 2020-12-03 DIAGNOSIS — M069 Rheumatoid arthritis, unspecified: Secondary | ICD-10-CM | POA: Diagnosis not present

## 2020-12-17 ENCOUNTER — Encounter: Payer: Self-pay | Admitting: Internal Medicine

## 2020-12-17 DIAGNOSIS — M549 Dorsalgia, unspecified: Secondary | ICD-10-CM | POA: Diagnosis not present

## 2020-12-17 DIAGNOSIS — M1990 Unspecified osteoarthritis, unspecified site: Secondary | ICD-10-CM | POA: Diagnosis not present

## 2020-12-17 DIAGNOSIS — M25539 Pain in unspecified wrist: Secondary | ICD-10-CM | POA: Diagnosis not present

## 2020-12-17 DIAGNOSIS — M722 Plantar fascial fibromatosis: Secondary | ICD-10-CM | POA: Diagnosis not present

## 2020-12-17 DIAGNOSIS — M797 Fibromyalgia: Secondary | ICD-10-CM | POA: Diagnosis not present

## 2020-12-17 DIAGNOSIS — E039 Hypothyroidism, unspecified: Secondary | ICD-10-CM | POA: Diagnosis not present

## 2020-12-17 DIAGNOSIS — M25522 Pain in left elbow: Secondary | ICD-10-CM | POA: Diagnosis not present

## 2020-12-17 DIAGNOSIS — M7702 Medial epicondylitis, left elbow: Secondary | ICD-10-CM | POA: Diagnosis not present

## 2020-12-17 DIAGNOSIS — Z79899 Other long term (current) drug therapy: Secondary | ICD-10-CM | POA: Diagnosis not present

## 2020-12-17 DIAGNOSIS — M0609 Rheumatoid arthritis without rheumatoid factor, multiple sites: Secondary | ICD-10-CM | POA: Diagnosis not present

## 2020-12-29 DIAGNOSIS — M069 Rheumatoid arthritis, unspecified: Secondary | ICD-10-CM | POA: Diagnosis not present

## 2020-12-29 DIAGNOSIS — G894 Chronic pain syndrome: Secondary | ICD-10-CM | POA: Diagnosis not present

## 2020-12-29 DIAGNOSIS — M961 Postlaminectomy syndrome, not elsewhere classified: Secondary | ICD-10-CM | POA: Diagnosis not present

## 2020-12-29 DIAGNOSIS — M47817 Spondylosis without myelopathy or radiculopathy, lumbosacral region: Secondary | ICD-10-CM | POA: Diagnosis not present

## 2021-01-01 ENCOUNTER — Other Ambulatory Visit: Payer: Self-pay | Admitting: Internal Medicine

## 2021-01-07 DIAGNOSIS — M0609 Rheumatoid arthritis without rheumatoid factor, multiple sites: Secondary | ICD-10-CM | POA: Diagnosis not present

## 2021-01-07 DIAGNOSIS — M1990 Unspecified osteoarthritis, unspecified site: Secondary | ICD-10-CM | POA: Diagnosis not present

## 2021-01-07 DIAGNOSIS — M7702 Medial epicondylitis, left elbow: Secondary | ICD-10-CM | POA: Diagnosis not present

## 2021-01-07 DIAGNOSIS — M797 Fibromyalgia: Secondary | ICD-10-CM | POA: Diagnosis not present

## 2021-01-07 DIAGNOSIS — Z79899 Other long term (current) drug therapy: Secondary | ICD-10-CM | POA: Diagnosis not present

## 2021-01-07 DIAGNOSIS — E039 Hypothyroidism, unspecified: Secondary | ICD-10-CM | POA: Diagnosis not present

## 2021-01-07 DIAGNOSIS — M722 Plantar fascial fibromatosis: Secondary | ICD-10-CM | POA: Diagnosis not present

## 2021-01-07 DIAGNOSIS — M549 Dorsalgia, unspecified: Secondary | ICD-10-CM | POA: Diagnosis not present

## 2021-01-08 ENCOUNTER — Ambulatory Visit: Payer: Medicare Other | Admitting: Adult Health

## 2021-01-09 NOTE — Progress Notes (Deleted)
MEDICARE ANNUAL WELLNESS VISIT AND FOLLOW UP  Assessment:    Diagnoses and all orders for this visit:  Encounter for Medicare annual wellness exam Due annually   Atherosclerosis of aorta Per CT 06/2018 Control blood pressure, cholesterol, glucose, increase exercise.   Left circumflex CAD/ Coronary calcium score 139/ Family history of MI Control blood pressure, cholesterol, glucose, increase exercise.  Referral to cardiology pending ***  Essential hypertension Continue medication Monitor blood pressure at home; call if consistently over 130/80 Continue DASH diet.   Reminder to go to the ER if any CP, SOB, nausea, dizziness, severe HA, changes vision/speech, left arm numbness and tingling and jaw pain.  Intrinsic asthma Stable on inhalers/meds  Sjogren's syndrome, with unspecified organ involvement ([place]) On DMARD, followed by rheumatology  Postoperative hypothyroidism continue medications the same pending lab results reminded to take on an empty stomach 30-66mins before food.  check TSH level  Cervical spondylosis with radiculopathy Currently fairly managed; followed by Dr. Dossie Der and Dr. Cyndy Freeze  Rheumatoid arthritis involving multiple sites, unspecified rheumatoid factor presence ([place]) Managed by Dr. Dossie Der, on DMARD  Degeneration of intervertebral disc of lumbosacral region Continue NSAID PRN, follows rheum  Vitamin D deficiency At goal at recent check; continue to recommend supplementation for goal of 70-100 Defer vitamin D level  Prediabetes Controlled on metformin for hx of severe fasting glucose elevation Discussed disease and risks Discussed diet/exercise, weight management  A1C q60m;   Morbid obesity ([place])  Long discussion about weight loss, diet, and exercise Recommended diet heavy in fruits and veggies and low in animal meats, cheeses, and dairy products, appropriate calorie intake Discussed appropriate weight for height and initial goal (185lb) Follow  up at next visit  Mixed hyperlipidemia Continue medications; rosuvastatin 40 mg daily, zeita 10 mg daily (new)  LDL goal <70  Continue low cholesterol diet and exercise.  Check lipid panel.   Medication management CBC, CMP/GFR, magnesium at routine visits  Hidradenitis suppurativa Doxycycline, endo follows  Former smoker 30 pack year, quit 2015 -lung cancer screening with low dose CT discussed as recommended by guidelines based on age, number of pack year history.  Discussed risks of screening including but not limited to false positives on xray, further testing or consultation with specialist, and possible false negative CT as well. Understanding expressed and wishes to proceed with CT testing. Order placed. ***   Over 40 minutes of exam, counseling, chart review and critical decision making was performed Future Appointments  Date Time Provider [place]  01/12/2021 11:00 AM Liane Comber, NP [facility] None  01/14/2021  3:20 PM Nahser, Wonda Cheng, MD [facility]  05/28/2021 10:00 AM Unk Pinto, MD [facility] None  01/12/2022 11:00 AM Liane Comber, NP [facility] None     Plan:   During the course of the visit the patient was educated and counseled about appropriate screening and preventive services including:    Pneumococcal vaccine   Prevnar 13  Influenza vaccine  Td vaccine  Screening electrocardiogram  Bone densitometry screening  Colorectal cancer screening  Diabetes screening  Glaucoma screening  Nutrition counseling   Advanced directives: requested   Subjective:  Autumn Frye is a 60 y.o. female who presents for Medicare Annual Wellness Visit and 3 month follow up.  Patient is followed by Dr Dossie Der  for Rheumatoid Arthritis and Sjogren's since 2009. She is on orencia/abatacept.  Chronic neck/lumbar pain, has spinal stimulator and on buprenorphine patches via Dr. Vira Blanco.  Dr. Chalmers Cater with endocrinology following for  hypothyroid  and hidradenitis suppurativa.  She has been on doxycycline 50 mg daily for many years as prophylaxis for this issue.  Asthma is controlled by singulaire daily, BREO PRN if has a cold. No recent flares.  Former smoker, 30 pack year hx, quit 2015, last low dose screening chest CT was 01/2020 without concerning nodules ***  BMI is There is no height or weight on file to calculate BMI., she has been working on diet and exercise, going to the [employer] (water aerobics with water weights) 3 days a week, reports scale hasn't moved but has noted pant size going down would like to get down to 140 lb or so, cutting out starch and sugar.  Has been following with Dr. Gaylord Shih "Adapt your life" program online via [facility], modified keto type diet 3 week plan, with greens, avocado, cruciferous veggies, plans to start this week.  Wt Readings from Last 3 Encounters:  10/15/20 202 lb (91.6 kg)  08/21/20 198 lb (89.8 kg)  05/14/20 203 lb (92.1 kg)   Former smoker, 30 pack year history, quit in 2015, will start low dose CT scans.  She has aortic atherosclerosis per abd CT 06/2018, L circumflex CAD per CT chest 01/2020 Had recent coronary calcium score on 12/01/2020 was 139, 90-100% for age, family hx of MI, cardiology referral was placed to Dr. Curt Bears  Her blood pressure has been controlled at home, today their BP is   She does workout. She denies chest pain, shortness of breath, dizziness.   She is on cholesterol medication (rosuvastatin 40 *** mg daily, zetia 10 mg daily since last visit) and denies myalgias. Her LDL cholesterol is not at goal of LDL <70. The cholesterol last visit was:   Lab Results  Component Value Date   CHOL 187 08/21/2020   HDL 72 08/21/2020   LDLCALC 98 08/21/2020   TRIG 77 08/21/2020   CHOLHDL 2.6 08/21/2020    She has been working on diet and exercise for prediabetes which was controlled on metformin, has since tapered off with A1Cs in normal range, and denies foot  ulcerations, increased appetite, nausea, paresthesia of the feet, polydipsia, polyuria and visual disturbances. Last A1C in the office was:  Lab Results  Component Value Date   HGBA1C 5.4 05/14/2020   She is on thyroid medication. Her medication was not changed last visit, taking 137 mcg synthroid daily, off of cytomel.  Lab Results  Component Value Date   TSH 1.55 08/21/2020   Last GFR: Lab Results  Component Value Date   GFRNONAA 100 10/15/2020   Patient is on Vitamin D supplement.   Lab Results  Component Value Date   VD25OH 53 05/14/2020      Medication Review: Current Outpatient Medications on File Prior to Visit  Medication Sig Dispense Refill  . Abatacept 125 MG/ML SOSY Inject 1 Syringe into the skin every 7 (seven) days.    Marland Kitchen acetaminophen (TYLENOL) 500 MG tablet Take 1 tablet (500 mg total) by mouth every 6 (six) hours as needed. 30 tablet 0  . Acetylcysteine (N-ACETYL-L-CYSTEINE PO) Take 1 capsule by mouth daily.    Marland Kitchen albuterol (PROVENTIL) (2.5 MG/3ML) 0.083% nebulizer solution Take 3 mLs (2.5 mg total) by nebulization every 6 (six) hours as needed for wheezing or shortness of breath. 60 vial 1  . albuterol (VENTOLIN HFA) 108 (90 Base) MCG/ACT inhaler INHALE 2 PUFFS INTO THE LUNGS EVERY 4 HOURS 15 MINUTES APART AS NEEDED FOR ASTHMA 40.2 g 1  . amitriptyline (ELAVIL) 25  MG tablet Take 1 tablet every Morning & 3 tablets at Bedtime for Chronic Pain 360 tablet 3  . ASPIRIN 81 PO Take by mouth daily.    Marland Kitchen BREO ELLIPTA 200-25 MCG/INH AEPB INHALE 1 PUFF BY MOUTH INTO THE LUNGS DAILY( RINSE MOUTH AFTERWARDS) 60 each PRN  . buprenorphine (BUTRANS) 10 MCG/HR PTWK Place 15 mcg onto the skin once a week.     . Cholecalciferol (VITAMIN D3) 5000 UNITS TABS Take 10,000 Units by mouth daily.     Marland Kitchen CRANBERRY PO Take by mouth daily.    . diclofenac Sodium (VOLTAREN) 1 % GEL diclofenac 1 % topical gel  APP 2 GRAMS TOPICALLY AA TWO TO TID    . doxycycline (VIBRAMYCIN) 50 MG capsule with  lunch.    . Eyelid Cleansers (AVENOVA) 0.01 % SOLN Place 1 application into both eyes 2 (two) times daily. Apply to eyelids BID 1 Bottle 1  . ezetimibe (ZETIA) 10 MG tablet TAKE 1 TABLET BY MOUTH DAILY FOR CHOLESTEROL 90 tablet 1  . FEMRING 0.1 MG/24HR RING INSERT 1 RING VAGINALLY EVERY 3 MONTHS 1 each 3  . fexofenadine (ALLEGRA) 180 MG tablet Take 180 mg by mouth daily.    . furosemide (LASIX) 40 MG tablet TAKE 1 TABLET BY MOUTH THREE TIMES DAILY FOR FLUID RETENTION 270 tablet 0  . L-GLUTAMINE PO Take 1 capsule by mouth daily.    Marland Kitchen l-methylfolate-B6-B12 (METANX) 3-35-2 MG TABS tablet Take 1 tablet by mouth daily.    . Levomefolate Glucosamine (METHYLFOLATE PO) Take 5,000 mg by mouth daily.    Marland Kitchen LOTEMAX 0.5 % GEL INSTILL 1 DROP IN BOTH EYES THREE TIMES DAILY 5 g 0  . Magnesium Oxide 500 MG TABS Take 500 mg by mouth daily.    . meclizine (ANTIVERT) 25 MG tablet 1/2-1 pill up to 3 times daily for motion sickness/dizziness 30 tablet 0  . Menaquinone-7 (VITAMIN K2) 100 MCG CAPS Take 1 capsule by mouth daily.    Marland Kitchen MILK THISTLE EXTRACT PO Take 2 capsules by mouth daily.    . montelukast (SINGULAIR) 10 MG tablet TAKE 1 TABLET BY MOUTH DAILY FOR ALLERGIES 90 tablet 3  . mupirocin ointment (BACTROBAN) 2 % APPLY EXTERNALLY TO THE AFFECTED AREA THREE TIMES DAILY FOR 10 DAYS 22 g 3  . olmesartan (BENICAR) 20 MG tablet Take  1 tablet  Daily  for BP 90 tablet 1  . Omega-3 Fatty Acids (FISH OIL) 1200 MG CAPS Take 2 capsules by mouth daily.     . ondansetron (ZOFRAN) 8 MG tablet Take 1/2 to 1 tablet 2 to 3 x /day as needed for Nausea 30 tablet 0  . Polyethyl Glycol-Propyl Glycol 0.4-0.3 % SOLN Place 1 drop into both eyes 4 (four) times daily.     . RESTASIS 0.05 % ophthalmic emulsion Place 1 drop into both eyes daily.   3  . rosuvastatin (CRESTOR) 40 MG tablet TAKE 1 TABLET BY MOUTH DAILY FOR CHOLESTEROL 90 tablet 1  . SYNTHROID 137 MCG tablet Take 1/2 tab 1 day a week, whole tab other days. Take first thing  in the morning on an empty stomach with water only. No other drinks/food/meds for 30-60 min. 90 tablet 1  . triamcinolone (NASACORT) 55 MCG/ACT AERO nasal inhaler 1 spray by Both Nostrils route daily.    . TURMERIC CURCUMIN PO Take 900 mg elemental calcium/kg/hr by mouth 2 (two) times daily.     No current facility-administered medications on file prior to visit.  Allergies  Allergen Reactions  . Ciprofloxacin Hives and Swelling    SWELLING REACTION UNSPECIFIED   . Levaquin [Levofloxacin] Hives  . Lyrica [Pregabalin] Other (See Comments)    Altered mental status ALTERED MENTAL STATUS HALLUCINATIONS  . Compazine [Prochlorperazine Edisylate]     UNSPECIFIED REACTION   . Hydrocodone Itching  . Morphine And Related Itching  . Penicillins Itching  . Sulfa Antibiotics Itching    Current Problems (verified) Patient Active Problem List   Diagnosis Date Noted  . CAD in native artery 12/03/2020  . Carotid stenosis 10/25/2020  . Insulin resistance 05/12/2020  . Former smoker, 30 pack year, quit 2015 01/09/2020  . Aortic atherosclerosis ([place]) 01/08/2020  . Hidradenitis suppurativa 05/16/2019  . Morbid obesity ([place]) 11/08/2017  . Abnormal glucose 08/02/2017  . Cervical spondylosis with radiculopathy 05/30/2017  . Rheumatoid arthritis involving multiple sites with positive rheumatoid factor ([place]) 11/14/2015  . Encounter for Medicare annual wellness exam 04/24/2015  . Sjogren's disease ([place]) 07/05/2014  . Medication management 03/26/2014  . Hyperlipidemia, mixed 09/24/2013  . Vitamin D deficiency 09/24/2013  . Intrinsic asthma   . Essential hypertension   . Hypothyroidism   . Degeneration of intervertebral disc of lumbosacral region 05/23/2008    Screening Tests Immunization History  Administered Date(s) Administered  . DTaP 09/15/2012, 09/15/2012  . Influenza Inj Mdck Quad With Preservative 08/02/2017  . Influenza Whole 09/06/2012  . Influenza-Unspecified 05/07/2014,  05/20/2016, 08/02/2017  . Pneumococcal Conjugate-13 09/15/2012, 07/05/2014  . Zoster 09/03/2013    Colonoscopy: 10/2011 - 10 year follow up, Dr. Fuller Plan  MGM: 07/02/2020 DEXA: 2013 *** PAP: reports had in 01/2019 by GYN  Former smoker, 30 pack year hx, quit 2015 Ct chest: low dose lung cancer screening 01/24/2020 CT coronary calcium: 12/01/2020, calcium score 139  Tdap: 2014 Pneum 13: 2015 Flu vaccine: 2018 Zoster: 2014 *** shingrix *** Covid 19: declines    Names of Other Physician/Practitioners you currently use: 1. [facility] here for primary care 2. [facility], eye doctor, last visit 2020 - mild cataracts 3. [facility], dentist, last visit 2020, goes q1m, due    Patient Care Team: Unk Pinto, MD as PCP - General (Internal Medicine) Ladene Artist, MD as Consulting Physician (Gastroenterology) Newt Minion, MD as Consulting Physician (Orthopedic Surgery) Unice Bailey, MD as Consulting Physician (Rheumatology) Karie Chimera, MD as Consulting Physician (Neurosurgery) Druscilla Brownie, MD as Consulting Physician (Dermatology) Druscilla Brownie, MD as Consulting Physician (Dermatology)  SURGICAL HISTORY She  has a past surgical history that includes Thyroidectomy; Upper gastrointestinal endoscopy; Total knee arthroplasty; Vaginal hysterectomy; Knee surgery; Pilonidal cyst excision; Tonsillectomy; Anterior lat lumbar fusion (Left, 01/26/2013); Lumbar percutaneous pedicle screw 1 level (Left, 01/26/2013); Knee Arthroplasty (Left, 02/16/2016); and Anterior cervical decomp/discectomy fusion (N/A, 05/30/2017). FAMILY HISTORY Her family history includes Alzheimer's disease in her paternal grandfather; Asthma in her brother; Breast cancer (age of onset: 63) in her maternal grandmother; Diabetes in her brother; Heart attack in her paternal grandfather; Heart attack (age of onset: 73) in her father; Hypertension in her father and  maternal grandmother; Lung cancer (age of onset: 25) in her father; Other in her daughter; Stroke in her paternal grandmother; Stroke (age of onset: 65) in her father. SOCIAL HISTORY She  reports that she quit smoking about 5 years ago. Her smoking use included cigarettes. She has a 30.00 pack-year smoking history. She has never used smokeless tobacco. She reports current alcohol use. She reports that she does not use drugs.  MEDICARE WELLNESS OBJECTIVES: Physical activity:   Cardiac risk factors:   Depression/mood screen:   Depression screen [facility] 2/9 05/12/2020  Decreased Interest 0  Down, Depressed, Hopeless 0  PHQ - 2 Score 0    ADLs:  In your present state of health, do you have any difficulty performing the following activities: 05/12/2020  Hearing? N  Vision? N  Difficulty concentrating or making decisions? N  Walking or climbing stairs? N  Dressing or bathing? N  Doing errands, shopping? N  Some recent data might be hidden     Cognitive Testing  Alert? Yes  Normal Appearance?Yes  Oriented to person? Yes  Place? Yes   Time? Yes  Recall of three objects?  Yes  Can perform simple calculations? Yes  Displays appropriate judgment?Yes  Can read the correct time from a watch face?Yes  EOL planning:    ***  Review of Systems  Constitutional: Negative for malaise/fatigue and weight loss.  HENT: Negative for hearing loss and tinnitus.   Eyes: Negative for blurred vision and double vision.  Respiratory: Negative for cough, sputum production, shortness of breath and wheezing.   Cardiovascular: Negative for chest pain, palpitations, orthopnea, claudication, leg swelling and PND.  Gastrointestinal: Negative for abdominal pain, blood in stool, constipation, diarrhea, heartburn, melena, nausea and vomiting.  Genitourinary: Negative.   Musculoskeletal: Positive for joint pain (wrists, follows rheumatology). Negative for falls and myalgias.  Skin: Negative for rash.  Neurological:  Negative for dizziness, tingling, sensory change, weakness and headaches.  Endo/Heme/Allergies: Negative for polydipsia.  Psychiatric/Behavioral: Negative.  Negative for depression, memory loss, substance abuse and suicidal ideas. The patient is not nervous/anxious and does not have insomnia.   All other systems reviewed and are negative.    Objective:     There were no vitals filed for this visit. There is no height or weight on file to calculate BMI.  General Appearance: Well nourished, in no apparent distress. Eyes: PERRLA, EOMs, conjunctiva no swelling or erythema Sinuses: No Frontal/maxillary tenderness ENT/Mouth: Ext aud canals clear, TMs without erythema, bulging. No erythema, swelling, or exudate on post pharynx.  Tonsils not swollen or erythematous. Hearing normal.  Neck: Supple, thyroid normal.  Respiratory: Respiratory effort normal, BS equal bilaterally without rales, rhonchi, wheezing or stridor.  Cardio: RRR with no MRGs. Brisk peripheral pulses without edema.  Abdomen: Soft, + BS.  Non tender, no guarding, rebound, hernias, masses. Lymphatics: Non tender without lymphadenopathy.  Musculoskeletal: Full ROM, 5/5 strength, Mildly antalgic gait. R wrist with some warmth; non-erythematous, no obvious deformity *** Skin: Warm, dry; intact without rashes, lesions, ecchynosis Neuro: Cranial nerves intact. No cerebellar symptoms.  Psych: Awake and oriented X 3, normal affect, Insight and Judgment appropriate.    Medicare Attestation I have personally reviewed: The patient's medical and social history Their use of alcohol, tobacco or illicit drugs Their current medications and supplements The patient's functional ability including ADLs,fall risks, home safety risks, cognitive, and hearing and visual impairment Diet and physical activities Evidence for depression or mood disorders  The patient's weight, height, BMI, and visual acuity have been recorded in the chart.  I have  made referrals, counseling, and provided education to the patient based on review of the above and I have provided the patient with a written personalized care plan for preventive services.     Izora Ribas, NP   01/09/2021

## 2021-01-12 ENCOUNTER — Ambulatory Visit: Payer: Medicare Other | Admitting: Adult Health

## 2021-01-13 ENCOUNTER — Encounter: Payer: Self-pay | Admitting: Cardiovascular Disease

## 2021-01-13 NOTE — Progress Notes (Signed)
Cardiology Office Note:    Date:  01/14/2021   ID:  Autumn Frye, DOB March 23, 1961, MRN [id number]  PCP:  Autumn Pinto, MD   [facility] Providers Cardiologist:  None     Referring MD: Autumn Comber, NP   Chief Complaint  Patient presents with  . Coronary Artery Disease    Jan 14, 2021:    Autumn Frye is a 60 y.o. female with a hx of CAD, autoimmune disease.(RA )  We were asked to see her by Autumn Mango, NP for further evaluation of her CAD She was found to have incidental coronary artery calcifications by CT scan  Coronary calcium score ( [facility] )  The total coronary calcium score is 139 which is between the 90th and 100th percentile for a female patient this age.  Father and paternal grandmother have CAD  Does exercise daily . Had some CP several months ago  Episode was a hot sensation of both arms, associ with sweats Lasted 10  Min.  Occurred at rest,  Not associated with exertion   Takes the lasix once a week - not TID as listed on the [facility]   Started Olmesartan several months ago .  Watches her salt intake     Past Medical History:  Diagnosis Date  . Arthritis    OA, RA  . Asthma    with a cold  . Autoimmune deficiency syndrome ([place])   . Back pain   . Cancer ([place])    thyroid-1995  . Family history of adverse reaction to anesthesia    mother problems with n/v  . Fibromyalgia   . GERD (gastroesophageal reflux disease)   . Hepatitis    'A" when pt. was 12 yrs. old  . Hiatal hernia   . Hyperlipidemia   . Hypertension   . Hypothyroidism   . Pneumonia     Past Surgical History:  Procedure Laterality Date  . ANTERIOR CERVICAL DECOMP/DISCECTOMY FUSION N/A 05/30/2017   Procedure: Cervical five-six, Cervical six-seven Anterior discectomy with fusion and plate fixation;  Surgeon: Ditty, Kevan Ny, MD;  Location: [facility];  Service: Neurosurgery;  Laterality: N/A;  . ANTERIOR LAT LUMBAR FUSION Left 01/26/2013   Procedure: ANTERIOR  LATERAL LUMBAR FUSION 1 LEVEL;  Surgeon: Faythe Ghee, MD;  Location: [facility];  Service: Neurosurgery;  Laterality: Left;  lumbar four-five  . KNEE ARTHROPLASTY Left 02/16/2016   Procedure: COMPUTER ASSISTED TOTAL KNEE ARTHROPLASTY;  Surgeon: Dereck Leep, MD;  Location: [facility];  Service: Orthopedics;  Laterality: Left;  . KNEE SURGERY     3 on right knee, 1 on left-prior to TKR  . LUMBAR PERCUTANEOUS PEDICLE SCREW 1 LEVEL Left 01/26/2013   Procedure: LUMBAR PERCUTANEOUS PEDICLE SCREW 1 LEVEL;  Surgeon: Faythe Ghee, MD;  Location: [facility];  Service: Neurosurgery;  Laterality: Left;  lumbar four-five  . PILONIDAL CYST EXCISION    . THYROIDECTOMY    . TONSILLECTOMY    . TOTAL KNEE ARTHROPLASTY     right  . UPPER GASTROINTESTINAL ENDOSCOPY    . VAGINAL HYSTERECTOMY      Current Medications: Current Meds  Medication Sig  . Abatacept 125 MG/ML SOSY Inject 1 Syringe into the skin every 7 (seven) days.  Marland Kitchen acetaminophen (TYLENOL) 500 MG tablet Take 1 tablet (500 mg total) by mouth every 6 (six) hours as needed.  . Acetylcysteine (N-ACETYL-L-CYSTEINE PO) Take 1 capsule by mouth daily.  Marland Kitchen albuterol (PROVENTIL) (2.5 MG/3ML) 0.083% nebulizer solution Take  3 mLs (2.5 mg total) by nebulization every 6 (six) hours as needed for wheezing or shortness of breath.  Marland Kitchen albuterol (VENTOLIN HFA) 108 (90 Base) MCG/ACT inhaler INHALE 2 PUFFS INTO THE LUNGS EVERY 4 HOURS 15 MINUTES APART AS NEEDED FOR ASTHMA  . amitriptyline (ELAVIL) 25 MG tablet Take 1 tablet every Morning & 3 tablets at Bedtime for Chronic Pain  . ASPIRIN 81 PO Take by mouth daily.  Marland Kitchen BREO ELLIPTA 200-25 MCG/INH AEPB INHALE 1 PUFF BY MOUTH INTO THE LUNGS DAILY( RINSE MOUTH AFTERWARDS)  . buprenorphine (BUTRANS) 20 MCG/HR PTWK Place 20 patches onto the skin once a week.  . Cholecalciferol (VITAMIN D3) 5000 UNITS TABS Take 10,000 Units by mouth daily.   . Cinnamon 500 MG capsule   . CRANBERRY PO Take by mouth daily.  .  diclofenac Sodium (VOLTAREN) 1 % GEL diclofenac 1 % topical gel  APP 2 GRAMS TOPICALLY AA TWO TO TID  . doxycycline (VIBRAMYCIN) 50 MG capsule with lunch.  . Eyelid Cleansers (AVENOVA) 0.01 % SOLN Place 1 application into both eyes 2 (two) times daily. Apply to eyelids BID  . FEMRING 0.1 MG/24HR RING INSERT 1 RING VAGINALLY EVERY 3 MONTHS  . fexofenadine (ALLEGRA) 180 MG tablet Take 180 mg by mouth daily.  . furosemide (LASIX) 40 MG tablet TAKE 1 TABLET BY MOUTH THREE TIMES DAILY FOR FLUID RETENTION  . L-GLUTAMINE PO Take 1 capsule by mouth daily.  Marland Kitchen l-methylfolate-B6-B12 (METANX) 3-35-2 MG TABS tablet Take 1 tablet by mouth daily.  . Levomefolate Glucosamine (METHYLFOLATE PO) Take 5,000 mg by mouth daily.  Marland Kitchen LOTEMAX 0.5 % GEL INSTILL 1 DROP IN BOTH EYES THREE TIMES DAILY  . Magnesium 200 MG TABS 2 tablets with a meal  . Magnesium Oxide 500 MG TABS Take 500 mg by mouth daily.  . Menaquinone-7 (VITAMIN K2) 100 MCG CAPS Take 1 capsule by mouth daily.  Marland Kitchen MILK THISTLE EXTRACT PO Take 2 capsules by mouth daily.  . montelukast (SINGULAIR) 10 MG tablet TAKE 1 TABLET BY MOUTH DAILY FOR ALLERGIES  . mupirocin ointment (BACTROBAN) 2 % APPLY EXTERNALLY TO THE AFFECTED AREA THREE TIMES DAILY FOR 10 DAYS  . olmesartan (BENICAR) 20 MG tablet Take  1 tablet  Daily  for BP  . Omega-3 Fatty Acids (FISH OIL) 1200 MG CAPS Take 2 capsules by mouth daily.   Vladimir Faster Glycol-Propyl Glycol 0.4-0.3 % SOLN Place 1 drop into both eyes 4 (four) times daily.   . RESTASIS 0.05 % ophthalmic emulsion Place 1 drop into both eyes daily.   . rosuvastatin (CRESTOR) 40 MG tablet TAKE 1 TABLET BY MOUTH DAILY FOR CHOLESTEROL  . SYNTHROID 137 MCG tablet Take 1/2 tab 1 day a week, whole tab other days. Take first thing in the morning on an empty stomach with water only. No other drinks/food/meds for 30-60 min.  Marland Kitchen SYNTHROID 150 MCG tablet Take 150 mcg by mouth every morning.  . triamcinolone (NASACORT) 55 MCG/ACT AERO nasal  inhaler 1 spray by Both Nostrils route daily.  . TURMERIC CURCUMIN PO Take 900 mg elemental calcium/kg/hr by mouth 2 (two) times daily.     Allergies:   Ciprofloxacin, Levaquin [levofloxacin], Lyrica [pregabalin], Compazine [prochlorperazine edisylate], Gabapentin, Septra [sulfamethoxazole-trimethoprim], Hydrocodone, Morphine and related, Penicillins, and Sulfa antibiotics   Social History   Socioeconomic History  . Marital status: Married    Spouse name: Not on file  . Number of children: Not on file  . Years of education: Not on file  .  Highest education level: Not on file  Occupational History  . Not on file  Tobacco Use  . Smoking status: Former Smoker    Packs/day: 0.75    Years: 40.00    Pack years: 30.00    Types: Cigarettes    Quit date: 07/05/2015    Years since quitting: 5.5  . Smokeless tobacco: Never Used  Substance and Sexual Activity  . Alcohol use: Yes    Alcohol/week: 0.0 standard drinks    Comment: rare  . Drug use: No  . Sexual activity: Not on file  Other Topics Concern  . Not on file  Social History Narrative  . Not on file   Social Determinants of Health   Financial Resource Strain: Not on file  Food Insecurity: Not on file  Transportation Needs: Not on file  Physical Activity: Not on file  Stress: Not on file  Social Connections: Not on file     Family History: The patient's family history includes Alzheimer's disease in her paternal grandfather; Asthma in her brother; Breast cancer (age of onset: 43) in her maternal grandmother; Diabetes in her brother; Heart attack in her paternal grandfather; Heart attack (age of onset: 34) in her father; Hypertension in her father and maternal grandmother; Lung cancer (age of onset: 9) in her father; Other in her daughter; Stroke in her paternal grandmother; Stroke (age of onset: 80) in her father. There is no history of Colon cancer, Esophageal cancer, Stomach cancer, or Rectal cancer.  ROS:   Please see  the history of present illness.     All other systems reviewed and are negative.  EKGs/Labs/Other Studies Reviewed:    The following studies were reviewed today:   EKG: Jan 14, 2021: Normal sinus rhythm at 84.  No ST or T wave changes.  Recent Labs: 05/14/2020: Magnesium 1.8 08/21/2020: TSH 1.55 10/15/2020: ALT 21; BUN 14; Creat 0.59; Hemoglobin 12.6; Platelets 291; Potassium 4.1; Sodium 137  Recent Lipid Panel    Component Value Date/Time   CHOL 187 08/21/2020 1159   TRIG 77 08/21/2020 1159   HDL 72 08/21/2020 1159   CHOLHDL 2.6 08/21/2020 1159   VLDL 19 01/28/2017 0930   LDLCALC 98 08/21/2020 1159     Risk Assessment/Calculations:       Physical Exam:    VS:  BP 130/80   Pulse 84   Ht 5\' 2"  (1.575 m)   Wt 195 lb 6.4 oz (88.6 kg)   LMP  (LMP Unknown)   SpO2 97%   BMI 35.74 kg/m     Wt Readings from Last 3 Encounters:  01/14/21 195 lb 6.4 oz (88.6 kg)  10/15/20 202 lb (91.6 kg)  08/21/20 198 lb (89.8 kg)     GEN:  Well nourished, well developed in no acute distress HEENT: Normal NECK: No JVD; No carotid bruits LYMPHATICS: No lymphadenopathy CARDIAC: RRR, no murmurs, rubs, gallops RESPIRATORY:  Clear to auscultation without rales, wheezing or rhonchi  ABDOMEN: Soft, non-tender, non-distended MUSCULOSKELETAL:  No edema; No deformity  SKIN: Warm and dry NEUROLOGIC:  Alert and oriented x 3 PSYCHIATRIC:  Normal affect   ASSESSMENT:    1. Precordial pain    PLAN:    In order of problems listed above:  1. Possible unstable angina: Autumn Frye presents following an episode of bilateral arm burning/chest discomfort.  It was a very intense feeling.  Lasted for perhaps 10 minutes.  She has a very strong family history of coronary artery disease and also has a history  of hyperlipidemia.  She has a coronary calcium score of 139 which places her in the between the 90th and 100th percentile for age/gender matched controls.  I like to schedule her for a [place]  study for further evaluation of this unstable angina equivalent.  2. Coronary calcifications:   3.  Hyperlipidemia:  She has a history of hyperlipidemia.  She is on rosuvastatin but her LDL cholesterol mains 98.  Given the fact that she has coronary artery disease I think that she needs to have a LDL cholesterol of between 50 and 70.  We will refer her to the lipid clinic for consideration of a PCSK9 inhibitor.   Shared Decision Making/Informed Consent The risks [chest pain, shortness of breath, cardiac arrhythmias, dizziness, blood pressure fluctuations, myocardial infarction, stroke/transient ischemic attack, nausea, vomiting, allergic reaction, radiation exposure, metallic taste sensation and life-threatening complications (estimated to be 1 in 10,000)], benefits (risk stratification, diagnosing coronary artery disease, treatment guidance) and alternatives of a nuclear stress test were discussed in detail with Autumn Frye and she agrees to proceed.    Medication Adjustments/Labs and Tests Ordered: Current medicines are reviewed at length with the patient today.  Concerns regarding medicines are outlined above.  Orders Placed This Encounter  Procedures  . AMB Referral to [facility] Pharm-[facility]  . MYOCARDIAL PERFUSION IMAGING  . EKG 12-Lead   No orders of the defined types were placed in this encounter.   Patient Instructions  Medication Instructions:  Your physician recommends that you continue on your current medications as directed. Please refer to the Current Medication list given to you today.  *If you need a refill on your cardiac medications before your next appointment, please call your pharmacy*   Lab Work: none If you have labs (blood work) drawn today and your tests are completely normal, you will receive your results only by: Marland Kitchen MyChart Message (if you have MyChart) OR . A paper copy in the mail If you have any lab test that is abnormal or we need to change your treatment,  we will call you to review the results.   Testing/Procedures: Your physician has requested that you have en exercise stress myoview. For further information please visit [email or web address]. Please follow instruction sheet, as given.  Follow-Up: At [facility], you and your health needs are our priority.  As part of our continuing mission to provide you with exceptional heart care, we have created designated Provider Care Teams.  These Care Teams include your primary Cardiologist (physician) and Advanced Practice Providers (APPs -  Physician Assistants and Nurse Practitioners) who all work together to provide you with the care you need, when you need it.    Your next appointment:   6 month(s)  The format for your next appointment:   In Person  Provider:   You may see one of the following Advanced Practice Providers on your designated Care Team:    Richardson Dopp, PA-C  Robbie Lis, [place]  Other Instructions You have been referred to see a pharmacist in the lipid clinic.      Signed, Mertie Moores, MD  01/14/2021 4:10 PM    [facility]

## 2021-01-14 ENCOUNTER — Ambulatory Visit: Payer: Medicare Other | Admitting: Cardiovascular Disease

## 2021-01-14 ENCOUNTER — Encounter: Payer: Self-pay | Admitting: Cardiovascular Disease

## 2021-01-14 ENCOUNTER — Other Ambulatory Visit: Payer: Self-pay

## 2021-01-14 VITALS — BP 130/80 | HR 84 | Ht 62.0 in | Wt 195.4 lb

## 2021-01-14 DIAGNOSIS — I10 Essential (primary) hypertension: Secondary | ICD-10-CM

## 2021-01-14 DIAGNOSIS — R072 Precordial pain: Secondary | ICD-10-CM

## 2021-01-14 DIAGNOSIS — I2510 Atherosclerotic heart disease of native coronary artery without angina pectoris: Secondary | ICD-10-CM

## 2021-01-14 NOTE — Patient Instructions (Signed)
Medication Instructions:  Your physician recommends that you continue on your current medications as directed. Please refer to the Current Medication list given to you today.  *If you need a refill on your cardiac medications before your next appointment, please call your pharmacy*   Lab Work: none If you have labs (blood work) drawn today and your tests are completely normal, you will receive your results only by: Marland Kitchen MyChart Message (if you have MyChart) OR . A paper copy in the mail If you have any lab test that is abnormal or we need to change your treatment, we will call you to review the results.   Testing/Procedures: Your physician has requested that you have en exercise stress myoview. For further information please visit [email or web address]. Please follow instruction sheet, as given.  Follow-Up: At [facility], you and your health needs are our priority.  As part of our continuing mission to provide you with exceptional heart care, we have created designated Provider Care Teams.  These Care Teams include your primary Cardiologist (physician) and Advanced Practice Providers (APPs -  Physician Assistants and Nurse Practitioners) who all work together to provide you with the care you need, when you need it.    Your next appointment:   6 month(s)  The format for your next appointment:   In Person  Provider:   You may see one of the following Advanced Practice Providers on your designated Care Team:    Richardson Dopp, PA-C  Robbie Lis, [place]  Other Instructions You have been referred to see a pharmacist in the lipid clinic.

## 2021-01-15 ENCOUNTER — Telehealth (HOSPITAL_COMMUNITY): Payer: Self-pay

## 2021-01-15 NOTE — Telephone Encounter (Signed)
Spoke the patient, she stated that she understood and would be here for her test. Asked to call back with any questions. S.Adalea Handler EMTP

## 2021-01-17 ENCOUNTER — Other Ambulatory Visit: Payer: Self-pay | Admitting: Adult Health

## 2021-01-17 DIAGNOSIS — J45909 Unspecified asthma, uncomplicated: Secondary | ICD-10-CM

## 2021-01-20 ENCOUNTER — Ambulatory Visit (HOSPITAL_COMMUNITY): Payer: Medicare Other | Attending: Cardiology

## 2021-01-20 ENCOUNTER — Other Ambulatory Visit: Payer: Self-pay

## 2021-01-20 DIAGNOSIS — R072 Precordial pain: Secondary | ICD-10-CM | POA: Insufficient documentation

## 2021-01-20 LAB — MYOCARDIAL PERFUSION IMAGING
LV dias vol: 83 mL (ref 46–106)
LV sys vol: 28 mL
Peak HR: 93 {beats}/min
Rest HR: 72 {beats}/min
SRS: 0
SSS: 1
TID: 1.09

## 2021-01-20 MED ORDER — TECHNETIUM TC 99M TETROFOSMIN IV KIT
31.4000 | PACK | Freq: Once | INTRAVENOUS | Status: AC | PRN
  Administered 2021-01-20: 31.4 via INTRAVENOUS
  Filled 2021-01-20: qty 32

## 2021-01-20 MED ORDER — TECHNETIUM TC 99M TETROFOSMIN IV KIT
9.8000 | PACK | Freq: Once | INTRAVENOUS | Status: AC | PRN
  Administered 2021-01-20: 9.8 via INTRAVENOUS
  Filled 2021-01-20: qty 10

## 2021-01-20 MED ORDER — REGADENOSON 0.4 MG/5ML IV SOLN
0.4000 mg | Freq: Once | INTRAVENOUS | Status: AC
  Administered 2021-01-20: 0.4 mg via INTRAVENOUS

## 2021-01-28 DIAGNOSIS — Z79891 Long term (current) use of opiate analgesic: Secondary | ICD-10-CM | POA: Diagnosis not present

## 2021-01-28 DIAGNOSIS — M961 Postlaminectomy syndrome, not elsewhere classified: Secondary | ICD-10-CM | POA: Diagnosis not present

## 2021-01-28 DIAGNOSIS — G894 Chronic pain syndrome: Secondary | ICD-10-CM | POA: Diagnosis not present

## 2021-01-28 DIAGNOSIS — Z79899 Other long term (current) drug therapy: Secondary | ICD-10-CM | POA: Diagnosis not present

## 2021-01-28 DIAGNOSIS — M47817 Spondylosis without myelopathy or radiculopathy, lumbosacral region: Secondary | ICD-10-CM | POA: Diagnosis not present

## 2021-01-28 DIAGNOSIS — M069 Rheumatoid arthritis, unspecified: Secondary | ICD-10-CM | POA: Diagnosis not present

## 2021-01-30 ENCOUNTER — Ambulatory Visit
Admission: RE | Admit: 2021-01-30 | Discharge: 2021-01-30 | Disposition: A | Discharge status: Home / Self Care | Payer: Medicare Other | Source: Ambulatory Visit | Attending: Physician Assistant | Admitting: Physician Assistant

## 2021-01-30 ENCOUNTER — Other Ambulatory Visit: Payer: Self-pay | Admitting: Physician Assistant

## 2021-01-30 ENCOUNTER — Other Ambulatory Visit: Payer: Self-pay

## 2021-01-30 DIAGNOSIS — M25522 Pain in left elbow: Secondary | ICD-10-CM

## 2021-01-30 DIAGNOSIS — M25512 Pain in left shoulder: Secondary | ICD-10-CM

## 2021-01-30 DIAGNOSIS — M19012 Primary osteoarthritis, left shoulder: Secondary | ICD-10-CM | POA: Diagnosis not present

## 2021-01-31 ENCOUNTER — Other Ambulatory Visit: Payer: Self-pay | Admitting: Adult Health

## 2021-02-04 ENCOUNTER — Other Ambulatory Visit: Payer: Self-pay

## 2021-02-04 ENCOUNTER — Telehealth: Payer: Self-pay | Admitting: Pharmacist

## 2021-02-04 ENCOUNTER — Ambulatory Visit (INDEPENDENT_AMBULATORY_CARE_PROVIDER_SITE_OTHER): Payer: Medicare Other | Admitting: Pharmacist

## 2021-02-04 DIAGNOSIS — E782 Mixed hyperlipidemia: Secondary | ICD-10-CM

## 2021-02-04 DIAGNOSIS — I700 Atherosclerosis of aorta: Secondary | ICD-10-CM | POA: Diagnosis not present

## 2021-02-04 LAB — LIPID PANEL
Chol/HDL Ratio: 2.7 ratio (ref 0.0–4.4)
Cholesterol, Total: 239 mg/dL — ABNORMAL HIGH (ref 100–199)
HDL: 87 mg/dL (ref >39)
LDL Chol Calc (NIH): 140 mg/dL — ABNORMAL HIGH (ref 0–99)
Triglycerides: 70 mg/dL (ref 0–149)
VLDL Cholesterol Cal: 12 mg/dL (ref 5–40)

## 2021-02-04 MED ORDER — REPATHA SURECLICK 140 MG/ML ~~LOC~~ SOAJ: 1.0000 "pen " | SUBCUTANEOUS | 3 refills | Status: DC

## 2021-02-04 NOTE — Telephone Encounter (Signed)
Repatha prior auth approved through 08/06/21. Rx sent to pharmacy, $0 copay, pt is aware. Will call her tomorrow with lipid panel results as well.

## 2021-02-04 NOTE — Patient Instructions (Addendum)
It was nice to meet you today!  Your LDL is 98 and your goal is < 70  Continue taking rosuvastatin 40mg  daily  I will submit information to your insurance to see if they will cover Repatha. This is an injection given once every 2 weeks in the fatty tissue of your stomach or upper outer thigh. Store the medication in the fridge. Repatha lowers your LDL by 60% and helps to prevent future heart attacks and strokes.  I will call you when Repatha is approved, and we will plan to recheck your cholesterol in 2 months.

## 2021-02-04 NOTE — Progress Notes (Addendum)
Patient ID: BERDELL NEVITT                 DOB: 06-Nov-1960                    MRN: [id number]     HPI: Autumn Frye is a 60 y.o. female patient referred to lipid clinic by Dr Acie Fredrickson. PMH is significant for incidental aortic atherosclerosis and LAD and left circumflex CAD noted on CT scan 01/24/20, coronary artery calcium score of 139 on 12/01/20 which was 90-100th percentile, HTN, osteo and rheumatoid arthritis, thyroid cancer in 1995, hypothyroidism, fibromyalgia, and GERD. She was referred to cardiology due to elevated coronary calcium score. At visit with Dr Acie Fredrickson on 01/14/21, pt reported episode of bilateral arm burning/chest discomfort. She was scheduled for Lexiscan Myoview on 01/20/21 which showed normal LV function 66%, no ischemia, probable normal perfusion, and was a low risk study.  Pt reports tolerating rosuvastatin well. She previously took ezetimibe for a few months, however it caused GI upset and cramping. She takes OTC fish oil recommended by her eye doctor. Previously also took pravastatin and gemfibrozil, no issues tolerating but meds were changed to other therapies. Reports she has extra help through [facility] for medications - her Orencia injections are covered completely.  Current Medications: rosuvastatin 40mg  daily, fish oil 1200mg  - 2 caps daily Intolerances: ezetimibe - GI issues, stomach cramping Risk Factors: CAD with calcium score 90-100th percentile, baseline LDL 180, fam hx CAD LDL goal: 70mg /dL  Diet: Focusing on low carb diet and avoiding bread. Likes spaghetti squash, chicken, high fiber low carb granola. Doesn't eat at restaurants.  [facility]  Family History: Father and paternal grandmother with CAD. Alzheimer's disease in her paternal grandfather; Asthma in her brother; Breast cancer (age of onset: 48) in her maternal grandmother; Diabetes in her brother; Heart attack in her paternal grandfather; Heart attack (age of onset: 73) in  her father; Hypertension in her father and maternal grandmother; Lung cancer (age of onset: 67) in her father; Other in her daughter; Stroke in her paternal grandmother; Stroke (age of onset: 41) in her father. There is no history of Colon cancer, Esophageal cancer, Stomach cancer, or Rectal cancer.  Social History: Former smoker 3/4 PPD for 40 years, quit in 2016. Rare alcohol and illicit drug use.  Labs: 08/21/20: TC 187, HDL 72, TG 77, LDL 98, nonHDL 115 (rosuvastatin 40mg  daily, ezetimibe 10mg  daily) 01/09/20: TC 269, HDL 68, TG 92, LDL 180, nonHDL 201  Past Medical History:  Diagnosis Date  . Arthritis    OA, RA  . Asthma    with a cold  . Autoimmune deficiency syndrome ([place])   . Back pain   . Cancer ([place])    thyroid-1995  . Family history of adverse reaction to anesthesia    mother problems with n/v  . Fibromyalgia   . GERD (gastroesophageal reflux disease)   . Hepatitis    'A" when pt. was 12 yrs. old  . Hiatal hernia   . Hyperlipidemia   . Hypertension   . Hypothyroidism   . Pneumonia     Current Outpatient Medications on File Prior to Visit  Medication Sig Dispense Refill  . Abatacept 125 MG/ML SOSY Inject 1 Syringe into the skin every 7 (seven) days.    Marland Kitchen acetaminophen (TYLENOL) 500 MG tablet Take 1 tablet (500 mg total) by mouth every 6 (six) hours as needed. 30 tablet  0  . Acetylcysteine (N-ACETYL-L-CYSTEINE PO) Take 1 capsule by mouth daily.    Marland Kitchen albuterol (PROVENTIL) (2.5 MG/3ML) 0.083% nebulizer solution Take 3 mLs (2.5 mg total) by nebulization every 6 (six) hours as needed for wheezing or shortness of breath. 60 vial 1  . albuterol (VENTOLIN HFA) 108 (90 Base) MCG/ACT inhaler INHALE 2 PUFFS INTO THE LUNGS EVERY 4 HOURS 15 MINUTES APART AS NEEDED FOR ASTHMA 40.2 g 1  . amitriptyline (ELAVIL) 25 MG tablet Take 1 tablet every Morning & 3 tablets at Bedtime for Chronic Pain 360 tablet 3  . ASPIRIN 81 PO Take by mouth daily.    . buprenorphine (BUTRANS) 20 MCG/HR  PTWK Place 20 patches onto the skin once a week.    . Cholecalciferol (VITAMIN D3) 5000 UNITS TABS Take 10,000 Units by mouth daily.     . Cinnamon 500 MG capsule     . CRANBERRY PO Take by mouth daily.    . diclofenac Sodium (VOLTAREN) 1 % GEL diclofenac 1 % topical gel  APP 2 GRAMS TOPICALLY AA TWO TO TID    . doxycycline (VIBRAMYCIN) 50 MG capsule with lunch.    . Eyelid Cleansers (AVENOVA) 0.01 % SOLN Place 1 application into both eyes 2 (two) times daily. Apply to eyelids BID 1 Bottle 1  . FEMRING 0.1 MG/24HR RING INSERT 1 RING VAGINALLY EVERY 3 MONTHS 1 each 3  . fexofenadine (ALLEGRA) 180 MG tablet Take 180 mg by mouth daily.    . fluticasone furoate-vilanterol (BREO ELLIPTA) 200-25 MCG/INH AEPB Use  1 Inhalation  Daily for Chronic Asthma 180 each 3  . furosemide (LASIX) 40 MG tablet Take  1 tablet  3 x /day  for Fluid Retention / Ankle Swelling 270 tablet 3  . L-GLUTAMINE PO Take 1 capsule by mouth daily.    Marland Kitchen l-methylfolate-B6-B12 (METANX) 3-35-2 MG TABS tablet Take 1 tablet by mouth daily.    . Levomefolate Glucosamine (METHYLFOLATE PO) Take 5,000 mg by mouth daily.    Marland Kitchen LOTEMAX 0.5 % GEL INSTILL 1 DROP IN BOTH EYES THREE TIMES DAILY 5 g 0  . Magnesium 200 MG TABS 2 tablets with a meal    . Magnesium Oxide 500 MG TABS Take 500 mg by mouth daily.    . Menaquinone-7 (VITAMIN K2) 100 MCG CAPS Take 1 capsule by mouth daily.    Marland Kitchen MILK THISTLE EXTRACT PO Take 2 capsules by mouth daily.    . montelukast (SINGULAIR) 10 MG tablet TAKE 1 TABLET BY MOUTH DAILY FOR ALLERGIES 90 tablet 3  . mupirocin ointment (BACTROBAN) 2 % APPLY EXTERNALLY TO THE AFFECTED AREA THREE TIMES DAILY FOR 10 DAYS 22 g 3  . olmesartan (BENICAR) 20 MG tablet Take  1 tablet  Daily  for BP 90 tablet 1  . Omega-3 Fatty Acids (FISH OIL) 1200 MG CAPS Take 2 capsules by mouth daily.     Vladimir Faster Glycol-Propyl Glycol 0.4-0.3 % SOLN Place 1 drop into both eyes 4 (four) times daily.     . RESTASIS 0.05 % ophthalmic emulsion  Place 1 drop into both eyes daily.   3  . rosuvastatin (CRESTOR) 40 MG tablet TAKE 1 TABLET BY MOUTH DAILY FOR CHOLESTEROL 90 tablet 1  . SYNTHROID 137 MCG tablet Take 1/2 tab 1 day a week, whole tab other days. Take first thing in the morning on an empty stomach with water only. No other drinks/food/meds for 30-60 min. 90 tablet 1  . SYNTHROID 150 MCG tablet Take  150 mcg by mouth every morning.    . triamcinolone (NASACORT) 55 MCG/ACT AERO nasal inhaler 1 spray by Both Nostrils route daily.    . TURMERIC CURCUMIN PO Take 900 mg elemental calcium/kg/hr by mouth 2 (two) times daily.    . vitamin B-12 (CYANOCOBALAMIN) 1000 MCG tablet 1 tablet     No current facility-administered medications on file prior to visit.    Allergies  Allergen Reactions  . Ciprofloxacin Hives and Swelling    SWELLING REACTION UNSPECIFIED   . Levaquin [Levofloxacin] Hives  . Lyrica [Pregabalin] Other (See Comments)    Altered mental status ALTERED MENTAL STATUS HALLUCINATIONS  . Compazine [Prochlorperazine Edisylate]     UNSPECIFIED REACTION   . Gabapentin     Diarrhea and nausea  . Septra [Sulfamethoxazole-Trimethoprim]     itch  . Hydrocodone Itching  . Morphine And Related Itching  . Penicillins Itching  . Sulfa Antibiotics Itching    Assessment/Plan:  1. Hyperlipidemia - LDL was 98 in 08/2020 on rosuvastatin 40mg  daily and ezetimibe 10mg  daily, above goal < 70 due to hx of CAD. Pt has since discontinued ezetimibe due to GI intolerance. She wishes to have updated lipid panel checked today. If LDL remains above goal, will plan to add Repatha 140mg  Q2W. Will call pt tomorrow with lab results and med plan.  Korrine Sicard E. Trea Latner, PharmD, BCACP, [facility] [address] [place], [place] [address] Phone: [phone]; Fax: [phone] 02/04/2021 11:13 AM

## 2021-02-05 NOTE — Telephone Encounter (Signed)
LDL has increased to 140 from 98, pt reports continued adherence with rosuvastatin 40mg  daily but did stop ezetimibe due to GI issues. She is aware of lab results and actually gave her first Repatha dose last night after seeing her lab results. Scheduled f/u labs in August to assess efficacy - LDL goal < 70.

## 2021-02-05 NOTE — Addendum Note (Signed)
Addended by: Janecia Palau E on: 02/05/2021 08:45 AM   Modules accepted: Orders

## 2021-02-18 DIAGNOSIS — R5383 Other fatigue: Secondary | ICD-10-CM | POA: Diagnosis not present

## 2021-02-18 DIAGNOSIS — Z79899 Other long term (current) drug therapy: Secondary | ICD-10-CM | POA: Diagnosis not present

## 2021-02-18 DIAGNOSIS — M0609 Rheumatoid arthritis without rheumatoid factor, multiple sites: Secondary | ICD-10-CM | POA: Diagnosis not present

## 2021-02-25 DIAGNOSIS — M961 Postlaminectomy syndrome, not elsewhere classified: Secondary | ICD-10-CM | POA: Diagnosis not present

## 2021-02-25 DIAGNOSIS — M069 Rheumatoid arthritis, unspecified: Secondary | ICD-10-CM | POA: Diagnosis not present

## 2021-02-25 DIAGNOSIS — Z79891 Long term (current) use of opiate analgesic: Secondary | ICD-10-CM | POA: Diagnosis not present

## 2021-02-25 DIAGNOSIS — Z9689 Presence of other specified functional implants: Secondary | ICD-10-CM | POA: Diagnosis not present

## 2021-02-25 DIAGNOSIS — Z79899 Other long term (current) drug therapy: Secondary | ICD-10-CM | POA: Diagnosis not present

## 2021-02-25 DIAGNOSIS — G894 Chronic pain syndrome: Secondary | ICD-10-CM | POA: Diagnosis not present

## 2021-02-25 DIAGNOSIS — M47817 Spondylosis without myelopathy or radiculopathy, lumbosacral region: Secondary | ICD-10-CM | POA: Diagnosis not present

## 2021-02-27 ENCOUNTER — Encounter: Payer: Self-pay | Admitting: Adult Health

## 2021-02-27 DIAGNOSIS — M25512 Pain in left shoulder: Secondary | ICD-10-CM | POA: Diagnosis not present

## 2021-02-27 DIAGNOSIS — M25522 Pain in left elbow: Secondary | ICD-10-CM | POA: Diagnosis not present

## 2021-02-27 DIAGNOSIS — M6281 Muscle weakness (generalized): Secondary | ICD-10-CM | POA: Diagnosis not present

## 2021-02-27 NOTE — Progress Notes (Signed)
MEDICARE ANNUAL WELLNESS VISIT AND FOLLOW UP  Assessment:    Diagnoses and all orders for this visit:  Encounter for Medicare annual wellness exam Due annually  Atherosclerosis of aorta ([place]) - CT 06/2018 Control blood pressure, cholesterol, glucose, increase exercise.   CAD Low risk myoview 01/2021 Dr. Acie Fredrickson following LDL goal <70 - lipid clinic  Control blood pressure, cholesterol, glucose, increase exercise. Reminder to go to the ER if any CP, SOB, nausea, dizziness, severe fatigue   Essential hypertension Continue medication Monitor blood pressure at home; call if consistently over 130/80 Continue DASH diet.   Reminder to go to the ER if any CP, SOB, nausea, dizziness, severe HA, changes vision/speech, left arm numbness and tingling and jaw pain.  Intrinsic asthma Stable on inhalers/meds  Sjogren's syndrome, with unspecified organ involvement ([place]) On DMARD, followed by rheumatology  Rheumatoid arthritis involving multiple sites, unspecified rheumatoid factor presence ([place]) Managed by Dr. Dossie Der, on DMARD  Cervical spondylosis with radiculopathy Currently fairly managed; followed by Dr. Dossie Der and Dr. Cyndy Freeze  Degeneration of intervertebral disc of lumbosacral region Continue NSAID PRN, follows rheum  Postoperative hypothyroidism Dr. Chalmers Cater continue medications the same pending lab results reminded to take on an empty stomach 30-81mins before food.  check TSH level  Vitamin D deficiency At goal at recent check; continue to recommend supplementation for goal of 70-100 Defer vitamin D level to CPE next OV  Prediabetes Controlled on metformin for hx of severe fasting glucose elevation Discussed disease and risks Discussed diet/exercise, weight management  A1C q69m; defer to CPE next OV  Morbid obesity ([place]) - BMI 36 with htn, hyperlipidemia  Long discussion about weight loss, diet, and exercise Recommended diet heavy in fruits and veggies and low in animal  meats, cheeses, and dairy products, appropriate calorie intake Discussed appropriate weight for height and initial goal (185lb) Follow up at next visit  Mixed hyperlipidemia Continue medications; rosuvastatin 40 mg daily, repatha q2w Discussed LDL goal <70 Continue low cholesterol diet and exercise.  Defer lipid panel to lipid clinic  Medication management CBC, CMP/GFR, magnesium at routine visits  Hidradenitis suppurativa Doxycycline, endo follows  Former smoker 30 pack year, quit 2015, last CT screening 01/2020 -lung cancer screening with low dose CT discussed as recommended by guidelines based on age, number of pack year history.  Discussed risks of screening including but not limited to false positives on xray, further testing or consultation with specialist, and possible false negative CT as well. Understanding expressed and wishes to proceed with CT testing. Order placed.   Need for pneumonia vaccination - 23 valent pneumococcal vaccine administered without complication today   UTI Continue macrobid, UA/Culture recheck by [facility] 1-2 weeks after completion of presumptive treatment or sooner if recurrent sx  Over 40 minutes of exam, counseling, chart review and critical decision making was performed Future Appointments  Date Time Provider [place]  04/08/2021  8:30 AM [facility]  05/28/2021 10:00 AM Unk Pinto, MD [facility] None  03/02/2022  4:00 PM Liane Comber, NP [facility] None     Plan:   During the course of the visit the patient was educated and counseled about appropriate screening and preventive services including:   Pneumococcal vaccine  Prevnar 13 Influenza vaccine Td vaccine Screening electrocardiogram Bone densitometry screening Colorectal cancer screening Diabetes screening Glaucoma screening Nutrition counseling  Advanced directives: requested   Subjective:  Autumn Frye is a 60 y.o. female who presents  for Medicare Annual Wellness Visit and 3  month follow up.  She reports developed dysuria and frank hematuria with clots abruptly over the weekend, was evaluated by teledoc and was prescribed macrobid 1 week course and azo with immediate improvement.   Patient is followed by Dr Dossie Der  for Rheumatoid Arthritis and Sjogren's since 2009. Recently transitioned from arencia to inflectra infusions with benefit.   Chronic neck/lumbar pain, has spinal stimulator and on buprenorphine patches via Dr. Vira Blanco.   Dr. Chalmers Cater with endocrinology following for hypothyroid and hidradenitis suppurativa. She has been on doxycycline 50 mg daily for many years as prophylaxis for this issue.  Asthma is controlled by singulaire daily, BREO PRN if has a cold. No recent flares.   BMI is Body mass index is 36.21 kg/m., she has been working on diet and exercise, going to the [employer] (water aerobics with water weights) 3 days a week, reports scale hasn't moved but has noted pant size going down would like to get down to 140 lb or so, cutting out starch and sugar.  Has been following with Dr. Gaylord Shih "Adapt your life" program online via [place].  Wt Readings from Last 3 Encounters:  03/02/21 198 lb (89.8 kg)  01/20/21 195 lb (88.5 kg)  01/14/21 195 lb 6.4 oz (88.6 kg)   Former smoker, 30 pack year history, quit in 2015, had benign lungs on low dose CT screening 01/24/2020.   CT did show aortic atherosclerosis and coronary artery calcifications. CT coronary calcium 11/2020 showed total calcium score 139, 90-100% for age/gender, Dr. Acie Fredrickson is following. She did have low risk myoview on 01/20/2021.   She has aortic atherosclerosis per abd CT 06/2018.  Her blood pressure has been controlled at home, today their BP is BP: 132/70 She does workout. She denies chest pain, shortness of breath, dizziness.   She is on cholesterol medication (rosuvastatin 40 mg daily, newly repatha q2w and tolerating well, has f/u lab check planned  04/08/2021 by lipid clinic) and denies myalgias. Her LDL cholesterol is not at goal. The cholesterol last visit was:   Lab Results  Component Value Date   CHOL 239 (H) 02/04/2021   HDL 87 02/04/2021   LDLCALC 140 (H) 02/04/2021   TRIG 70 02/04/2021   CHOLHDL 2.7 02/04/2021    She has been working on diet and exercise for prediabetes which was controlled on metformin which recently stopped taking, and denies foot ulcerations, increased appetite, nausea, paresthesia of the feet, polydipsia, polyuria and visual disturbances. Last A1C in the office was:  Lab Results  Component Value Date   HGBA1C 5.4 05/14/2020   She is on thyroid medication. Her medication was not changed last visit, taking 150 mcg synthroid daily, stopped taking cytomel.  Lab Results  Component Value Date   TSH 1.55 08/21/2020   Last GFR: Lab Results  Component Value Date   GFRNONAA 100 10/15/2020   Patient is on Vitamin D supplement.   Lab Results  Component Value Date   VD25OH 53 05/14/2020      Medication Review: Current Outpatient Medications on File Prior to Visit  Medication Sig Dispense Refill   acetaminophen (TYLENOL) 500 MG tablet Take 1 tablet (500 mg total) by mouth every 6 (six) hours as needed. 30 tablet 0   Acetylcysteine (N-ACETYL-L-CYSTEINE PO) Take 1 capsule by mouth daily.     albuterol (PROVENTIL) (2.5 MG/3ML) 0.083% nebulizer solution Take 3 mLs (2.5 mg total) by nebulization every 6 (six) hours as needed for wheezing or shortness of breath. 60 vial 1  albuterol (VENTOLIN HFA) 108 (90 Base) MCG/ACT inhaler INHALE 2 PUFFS INTO THE LUNGS EVERY 4 HOURS 15 MINUTES APART AS NEEDED FOR ASTHMA 40.2 g 1   amitriptyline (ELAVIL) 25 MG tablet Take 1 tablet every Morning & 3 tablets at Bedtime for Chronic Pain 360 tablet 3   ASPIRIN 81 PO Take by mouth daily.     Buprenorphine HCl (BELBUCA) 300 MCG FILM Place inside cheek 2 (two) times daily.     Cholecalciferol (VITAMIN D3) 5000 UNITS TABS Take 10,000  Units by mouth daily.      Cinnamon 500 MG capsule Take 500 mg by mouth daily.     CRANBERRY PO Take 1 tablet by mouth 2 (two) times daily.     diclofenac Sodium (VOLTAREN) 1 % GEL diclofenac 1 % topical gel  APP 2 GRAMS TOPICALLY AA TWO TO TID     doxycycline (VIBRAMYCIN) 50 MG capsule Take 50 mg by mouth daily.     Evolocumab (REPATHA SURECLICK) 132 MG/ML SOAJ Inject 1 pen into the skin every 14 (fourteen) days. 6 mL 3   Eyelid Cleansers (AVENOVA) 0.01 % SOLN Place 1 application into both eyes 2 (two) times daily. Apply to eyelids BID 1 Bottle 1   FEMRING 0.1 MG/24HR RING INSERT 1 RING VAGINALLY EVERY 3 MONTHS 1 each 3   fexofenadine (ALLEGRA) 180 MG tablet Take 180 mg by mouth daily.     fluticasone furoate-vilanterol (BREO ELLIPTA) 200-25 MCG/INH AEPB Use  1 Inhalation  Daily for Chronic Asthma 180 each 3   furosemide (LASIX) 40 MG tablet Take 40 mg by mouth once a week.     inFLIXimab-dyyb (INFLECTRA IV) Inject into the vein.     L-GLUTAMINE PO Take 1 capsule by mouth daily.     l-methylfolate-B6-B12 (METANX) 3-35-2 MG TABS tablet Take 1 tablet by mouth daily.     Levomefolate Glucosamine (METHYLFOLATE PO) Take 5,000 mg by mouth daily.     LOTEMAX 0.5 % GEL INSTILL 1 DROP IN BOTH EYES THREE TIMES DAILY 5 g 0   Magnesium Oxide 500 MG TABS Take 1,000 mg by mouth daily.     Menaquinone-7 (VITAMIN K2) 100 MCG CAPS Take 1 capsule by mouth daily.     MILK THISTLE EXTRACT PO Take 2 capsules by mouth daily.     montelukast (SINGULAIR) 10 MG tablet TAKE 1 TABLET BY MOUTH DAILY FOR ALLERGIES 90 tablet 3   mupirocin ointment (BACTROBAN) 2 % APPLY EXTERNALLY TO THE AFFECTED AREA THREE TIMES DAILY FOR 10 DAYS 22 g 3   olmesartan (BENICAR) 20 MG tablet Take  1 tablet  Daily  for BP 90 tablet 1   Omega-3 Fatty Acids (FISH OIL) 1200 MG CAPS Take 2 capsules by mouth daily.      Polyethyl Glycol-Propyl Glycol 0.4-0.3 % SOLN Place 1 drop into both eyes 4 (four) times daily.      RESTASIS 0.05 % ophthalmic  emulsion Place 1 drop into both eyes daily.   3   rosuvastatin (CRESTOR) 40 MG tablet TAKE 1 TABLET BY MOUTH DAILY FOR CHOLESTEROL 90 tablet 1   SYNTHROID 150 MCG tablet Take 150 mcg by mouth every morning.     triamcinolone (NASACORT) 55 MCG/ACT AERO nasal inhaler 1 spray by Both Nostrils route daily.     TURMERIC CURCUMIN PO Take 900 mg elemental calcium/kg/hr by mouth 2 (two) times daily.     vitamin B-12 (CYANOCOBALAMIN) 1000 MCG tablet 1 tablet     No current facility-administered medications on  file prior to visit.    Allergies  Allergen Reactions   Ciprofloxacin Hives and Swelling    SWELLING REACTION UNSPECIFIED    Levaquin [Levofloxacin] Hives   Lyrica [Pregabalin] Other (See Comments)    Altered mental status ALTERED MENTAL STATUS HALLUCINATIONS   Compazine [Prochlorperazine Edisylate]     UNSPECIFIED REACTION    Gabapentin     Diarrhea and nausea   Septra [Sulfamethoxazole-Trimethoprim]     itch   Zetia [Ezetimibe]     Stomach cramping   Hydrocodone Itching   Morphine And Related Itching   Penicillins Itching   Sulfa Antibiotics Itching    Current Problems (verified) Patient Active Problem List   Diagnosis Date Noted   CAD in native artery 12/03/2020   Carotid stenosis 10/25/2020   Insulin resistance 05/12/2020   Former smoker, 30 pack year, quit 2015 01/09/2020   Aortic atherosclerosis ([place]) 01/08/2020   Hidradenitis suppurativa 05/16/2019   Morbid obesity ([place]) 11/08/2017   Abnormal glucose 08/02/2017   Cervical spondylosis with radiculopathy 05/30/2017   Rheumatoid arthritis involving multiple sites with positive rheumatoid factor ([place]) 11/14/2015   Sjogren's disease ([place]) 07/05/2014   Medication management 03/26/2014   Hyperlipidemia, mixed 09/24/2013   Vitamin D deficiency 09/24/2013   Intrinsic asthma    Essential hypertension    Hypothyroidism    Degeneration of intervertebral disc of lumbosacral region 05/23/2008    Screening  Tests Immunization History  Administered Date(s) Administered   DTaP 09/15/2012, 09/15/2012   Influenza Inj Mdck Quad With Preservative 08/02/2017   Influenza Whole 09/06/2012   Influenza-Unspecified 05/07/2014, 05/20/2016, 08/02/2017   Pneumococcal Conjugate-13 07/05/2014   Zoster, Live 09/03/2013   Colonoscopy: 10/2011 - 10 year follow up  PAP/pelvic: s/p hysterectomy age 12, Dr. Edwyna Ready follows annually MGM: 06/2020 DEXA: 2013, normal   Former smoker, 30 pack year hx, quit 2015 Ct chest: 01/24/2020 - follow up order placed   Tdap: 2014 Prevnar 13: 2015 Pneum 23: Today due to DMARD Zostavax: reports had several years back, severe reaction, declines shingrix Flu vaccine: 2018 Covid 19: declines   Names of Other Physician/Practitioners you currently use: 1. [facility] here for primary care 2. [place], eye doctor, last visit 2022 - mild cataracts 3. [facility], dentist, last visit 2020, goes q51m, overdue   Patient Care Team: Unk Pinto, MD as PCP - General (Internal Medicine) Ladene Artist, MD as Consulting Physician (Gastroenterology) Newt Minion, MD as Consulting Physician (Orthopedic Surgery) Unice Bailey, MD as Consulting Physician (Rheumatology) Karie Chimera, MD as Consulting Physician (Neurosurgery) Druscilla Brownie, MD as Consulting Physician (Dermatology) Druscilla Brownie, MD as Consulting Physician (Dermatology)  SURGICAL HISTORY She  has a past surgical history that includes Thyroidectomy; Upper gastrointestinal endoscopy; Total knee arthroplasty; Vaginal hysterectomy; Knee surgery; Pilonidal cyst excision; Tonsillectomy; Anterior lat lumbar fusion (Left, 01/26/2013); Lumbar percutaneous pedicle screw 1 level (Left, 01/26/2013); Knee Arthroplasty (Left, 02/16/2016); and Anterior cervical decomp/discectomy fusion (N/A, 05/30/2017). FAMILY HISTORY Her family history includes Alzheimer's disease in her  paternal grandfather; Asthma in her brother; Breast cancer (age of onset: 35) in her maternal grandmother; Diabetes in her brother; Heart attack in her paternal grandfather; Heart attack (age of onset: 16) in her father; Hypertension in her father and maternal grandmother; Lung cancer (age of onset: 17) in her father; Other in her daughter; Stroke in her paternal grandmother; Stroke (age of onset: 14) in her father. SOCIAL HISTORY She  reports that she quit smoking about 5 years ago. Her smoking  use included cigarettes. She has a 30.00 pack-year smoking history. She has never used smokeless tobacco. She reports current alcohol use. She reports that she does not use drugs.   MEDICARE WELLNESS OBJECTIVES: Physical activity: Current Exercise Habits: Structured exercise class, Type of exercise: Other - see comments (water aerobic), Time (Minutes): 45, Frequency (Times/Week): 3, Weekly Exercise (Minutes/Week): 135, Intensity: Mild, Exercise limited by: orthopedic condition(s) Cardiac risk factors: Cardiac Risk Factors include: advanced age (>17men, >7 women);dyslipidemia;hypertension;obesity (BMI >30kg/m2) Depression/mood screen:   Depression screen [facility] 2/9 03/02/2021  Decreased Interest 0  Down, Depressed, Hopeless 0  PHQ - 2 Score 0    ADLs:  In your present state of health, do you have any difficulty performing the following activities: 03/02/2021 05/12/2020  Hearing? N N  Vision? N N  Difficulty concentrating or making decisions? N N  Walking or climbing stairs? N N  Dressing or bathing? N N  Doing errands, shopping? N N  Some recent data might be hidden     Cognitive Testing  Alert? Yes  Normal Appearance?Yes  Oriented to person? Yes  Place? Yes   Time? Yes  Recall of three objects?  Yes  Can perform simple calculations? Yes  Displays appropriate judgment?Yes  Can read the correct time from a watch face?Yes  EOL planning: Does Patient Have a Medical Advance Directive?: No Does patient  want to make changes to medical advance directive?: Yes (MAU/Ambulatory/Procedural Areas - Information given) Would patient like information on creating a medical advance directive?: No - Patient declined  Review of Systems  Constitutional:  Negative for malaise/fatigue and weight loss.  HENT:  Negative for hearing loss and tinnitus.   Eyes:  Negative for blurred vision and double vision.  Respiratory:  Negative for cough, sputum production, shortness of breath and wheezing.   Cardiovascular:  Negative for chest pain, palpitations, orthopnea, claudication, leg swelling and PND.  Gastrointestinal:  Negative for abdominal pain, blood in stool, constipation, diarrhea, heartburn, melena, nausea and vomiting.  Genitourinary: Negative.   Musculoskeletal:  Positive for joint pain (wrists, follows rheumatology). Negative for falls and myalgias.  Skin:  Negative for rash.  Neurological:  Negative for dizziness, tingling, sensory change, weakness and headaches.  Endo/Heme/Allergies:  Negative for polydipsia.  Psychiatric/Behavioral: Negative.  Negative for depression, memory loss, substance abuse and suicidal ideas. The patient is not nervous/anxious and does not have insomnia.   All other systems reviewed and are negative.   Objective:     Today's Vitals   03/02/21 1559  BP: 132/70  Pulse: (!) 102  Temp: (!) 97.5 F (36.4 C)  SpO2: 97%  Weight: 198 lb (89.8 kg)   Body mass index is 36.21 kg/m.  General Appearance: Well nourished, in no apparent distress. Eyes: PERRLA, EOMs, conjunctiva no swelling or erythema Sinuses: No Frontal/maxillary tenderness ENT/Mouth: Ext aud canals clear, TMs without erythema, bulging. No erythema, swelling, or exudate on post pharynx.  Tonsils not swollen or erythematous. Hearing normal.  Neck: Supple, thyroid normal.  Respiratory: Respiratory effort normal, BS equal bilaterally without rales, rhonchi, wheezing or stridor.  Cardio: RRR with no MRGs. Brisk  peripheral pulses without edema.  Abdomen: Soft, + BS.  Non tender, no guarding, rebound, hernias, masses. Lymphatics: Non tender without lymphadenopathy.  Musculoskeletal: Full ROM, 5/5 strength, Mildly antalgic gait. R wrist with some warmth; non-erythematous, no obvious deformity Skin: Warm, dry; intact without rashes, lesions, ecchynosis Neuro: Cranial nerves intact. No cerebellar symptoms.  Psych: Awake and oriented X 3, normal affect, Insight and  Judgment appropriate.    Medicare Attestation I have personally reviewed: The patient's medical and social history Their use of alcohol, tobacco or illicit drugs Their current medications and supplements The patient's functional ability including ADLs,fall risks, home safety risks, cognitive, and hearing and visual impairment Diet and physical activities Evidence for depression or mood disorders  The patient's weight, height, BMI, and visual acuity have been recorded in the chart.  I have made referrals, counseling, and provided education to the patient based on review of the above and I have provided the patient with a written personalized care plan for preventive services.     Izora Ribas, NP   03/02/2021

## 2021-03-02 ENCOUNTER — Encounter: Payer: Self-pay | Admitting: Adult Health

## 2021-03-02 ENCOUNTER — Ambulatory Visit (INDEPENDENT_AMBULATORY_CARE_PROVIDER_SITE_OTHER): Payer: Medicare Other | Admitting: Adult Health

## 2021-03-02 ENCOUNTER — Other Ambulatory Visit: Payer: Self-pay

## 2021-03-02 VITALS — BP 132/70 | HR 102 | Temp 97.5°F | Wt 198.0 lb

## 2021-03-02 DIAGNOSIS — E559 Vitamin D deficiency, unspecified: Secondary | ICD-10-CM

## 2021-03-02 DIAGNOSIS — I700 Atherosclerosis of aorta: Secondary | ICD-10-CM | POA: Diagnosis not present

## 2021-03-02 DIAGNOSIS — Z23 Encounter for immunization: Secondary | ICD-10-CM | POA: Diagnosis not present

## 2021-03-02 DIAGNOSIS — I10 Essential (primary) hypertension: Secondary | ICD-10-CM | POA: Diagnosis not present

## 2021-03-02 DIAGNOSIS — I2510 Atherosclerotic heart disease of native coronary artery without angina pectoris: Secondary | ICD-10-CM

## 2021-03-02 DIAGNOSIS — E890 Postprocedural hypothyroidism: Secondary | ICD-10-CM

## 2021-03-02 DIAGNOSIS — Z0001 Encounter for general adult medical examination with abnormal findings: Secondary | ICD-10-CM | POA: Diagnosis not present

## 2021-03-02 DIAGNOSIS — R7309 Other abnormal glucose: Secondary | ICD-10-CM | POA: Diagnosis not present

## 2021-03-02 DIAGNOSIS — M3500 Sicca syndrome, unspecified: Secondary | ICD-10-CM

## 2021-03-02 DIAGNOSIS — Z87891 Personal history of nicotine dependence: Secondary | ICD-10-CM

## 2021-03-02 DIAGNOSIS — I6529 Occlusion and stenosis of unspecified carotid artery: Secondary | ICD-10-CM

## 2021-03-02 DIAGNOSIS — N3001 Acute cystitis with hematuria: Secondary | ICD-10-CM

## 2021-03-02 DIAGNOSIS — J45909 Unspecified asthma, uncomplicated: Secondary | ICD-10-CM

## 2021-03-02 DIAGNOSIS — Z79899 Other long term (current) drug therapy: Secondary | ICD-10-CM | POA: Diagnosis not present

## 2021-03-02 DIAGNOSIS — L732 Hidradenitis suppurativa: Secondary | ICD-10-CM | POA: Diagnosis not present

## 2021-03-02 DIAGNOSIS — R6889 Other general symptoms and signs: Secondary | ICD-10-CM | POA: Diagnosis not present

## 2021-03-02 DIAGNOSIS — M0579 Rheumatoid arthritis with rheumatoid factor of multiple sites without organ or systems involvement: Secondary | ICD-10-CM | POA: Diagnosis not present

## 2021-03-02 DIAGNOSIS — E782 Mixed hyperlipidemia: Secondary | ICD-10-CM

## 2021-03-02 DIAGNOSIS — E8881 Metabolic syndrome: Secondary | ICD-10-CM

## 2021-03-02 DIAGNOSIS — Z0000 Encounter for general adult medical examination without abnormal findings: Secondary | ICD-10-CM

## 2021-03-02 NOTE — Addendum Note (Signed)
Addended by: Chancy Hurter on: 03/02/2021 05:39 PM   Modules accepted: Orders

## 2021-03-03 ENCOUNTER — Other Ambulatory Visit: Payer: Self-pay | Admitting: Adult Health

## 2021-03-03 DIAGNOSIS — M6281 Muscle weakness (generalized): Secondary | ICD-10-CM | POA: Diagnosis not present

## 2021-03-03 DIAGNOSIS — M25512 Pain in left shoulder: Secondary | ICD-10-CM | POA: Diagnosis not present

## 2021-03-03 DIAGNOSIS — M25522 Pain in left elbow: Secondary | ICD-10-CM | POA: Diagnosis not present

## 2021-03-03 LAB — CBC WITH DIFFERENTIAL/PLATELET
Absolute Monocytes: 469 cells/uL (ref 200–950)
Basophils Absolute: 18 cells/uL (ref 0–200)
Basophils Relative: 0.2 %
Eosinophils Absolute: 9 cells/uL — ABNORMAL LOW (ref 15–500)
Eosinophils Relative: 0.1 %
HCT: 41.1 % (ref 35.0–45.0)
Hemoglobin: 13.7 g/dL (ref 11.7–15.5)
Lymphs Abs: 3910 cells/uL — ABNORMAL HIGH (ref 850–3900)
MCH: 29.6 pg (ref 27.0–33.0)
MCHC: 33.3 g/dL (ref 32.0–36.0)
MCV: 88.8 fL (ref 80.0–100.0)
MPV: 10.2 fL (ref 7.5–12.5)
Monocytes Relative: 5.1 %
Neutro Abs: 4793 cells/uL (ref 1500–7800)
Neutrophils Relative %: 52.1 %
Platelets: 355 10*3/uL (ref 140–400)
RBC: 4.63 10*6/uL (ref 3.80–5.10)
RDW: 14 % (ref 11.0–15.0)
Total Lymphocyte: 42.5 %
WBC: 9.2 10*3/uL (ref 3.8–10.8)

## 2021-03-03 LAB — COMPLETE METABOLIC PANEL WITH GFR
AG Ratio: 1.8 (calc) (ref 1.0–2.5)
ALT: 19 U/L (ref 6–29)
AST: 18 U/L (ref 10–35)
Albumin: 4.3 g/dL (ref 3.6–5.1)
Alkaline phosphatase (APISO): 59 U/L (ref 37–153)
BUN: 13 mg/dL (ref 7–25)
CO2: 30 mmol/L (ref 20–32)
Calcium: 9.9 mg/dL (ref 8.6–10.4)
Chloride: 104 mmol/L (ref 98–110)
Creat: 0.63 mg/dL (ref 0.50–1.05)
GFR, Est African American: 114 mL/min/{1.73_m2} (ref >=60)
GFR, Est Non African American: 98 mL/min/{1.73_m2} (ref >=60)
Globulin: 2.4 g/dL (calc) (ref 1.9–3.7)
Glucose, Bld: 88 mg/dL (ref 65–99)
Potassium: 4.7 mmol/L (ref 3.5–5.3)
Sodium: 140 mmol/L (ref 135–146)
Total Bilirubin: 0.3 mg/dL (ref 0.2–1.2)
Total Protein: 6.7 g/dL (ref 6.1–8.1)

## 2021-03-03 LAB — MAGNESIUM: Magnesium: 1.8 mg/dL (ref 1.5–2.5)

## 2021-03-03 LAB — TSH: TSH: 0.09 mIU/L — ABNORMAL LOW (ref 0.40–4.50)

## 2021-03-03 MED ORDER — SYNTHROID 150 MCG PO TABS: ORAL_TABLET | ORAL | 3 refills | Status: DC

## 2021-03-04 DIAGNOSIS — M0609 Rheumatoid arthritis without rheumatoid factor, multiple sites: Secondary | ICD-10-CM | POA: Diagnosis not present

## 2021-03-06 DIAGNOSIS — M25512 Pain in left shoulder: Secondary | ICD-10-CM | POA: Diagnosis not present

## 2021-03-06 DIAGNOSIS — M25522 Pain in left elbow: Secondary | ICD-10-CM | POA: Diagnosis not present

## 2021-03-06 DIAGNOSIS — M6281 Muscle weakness (generalized): Secondary | ICD-10-CM | POA: Diagnosis not present

## 2021-03-10 DIAGNOSIS — M19012 Primary osteoarthritis, left shoulder: Secondary | ICD-10-CM | POA: Diagnosis not present

## 2021-03-13 DIAGNOSIS — M25512 Pain in left shoulder: Secondary | ICD-10-CM | POA: Diagnosis not present

## 2021-03-13 DIAGNOSIS — M25522 Pain in left elbow: Secondary | ICD-10-CM | POA: Diagnosis not present

## 2021-03-13 DIAGNOSIS — M6281 Muscle weakness (generalized): Secondary | ICD-10-CM | POA: Diagnosis not present

## 2021-03-16 DIAGNOSIS — M6281 Muscle weakness (generalized): Secondary | ICD-10-CM | POA: Diagnosis not present

## 2021-03-16 DIAGNOSIS — M25512 Pain in left shoulder: Secondary | ICD-10-CM | POA: Diagnosis not present

## 2021-03-16 DIAGNOSIS — M25522 Pain in left elbow: Secondary | ICD-10-CM | POA: Diagnosis not present

## 2021-03-18 DIAGNOSIS — M6281 Muscle weakness (generalized): Secondary | ICD-10-CM | POA: Diagnosis not present

## 2021-03-18 DIAGNOSIS — M25512 Pain in left shoulder: Secondary | ICD-10-CM | POA: Diagnosis not present

## 2021-03-18 DIAGNOSIS — M25522 Pain in left elbow: Secondary | ICD-10-CM | POA: Diagnosis not present

## 2021-03-20 ENCOUNTER — Other Ambulatory Visit: Payer: Self-pay

## 2021-03-20 ENCOUNTER — Ambulatory Visit: Payer: Medicare Other

## 2021-03-20 ENCOUNTER — Other Ambulatory Visit: Payer: Self-pay | Admitting: Nurse Practitioner

## 2021-03-20 DIAGNOSIS — N3001 Acute cystitis with hematuria: Secondary | ICD-10-CM

## 2021-03-22 LAB — URINE CULTURE
MICRO NUMBER:: 12126228
SPECIMEN QUALITY:: ADEQUATE

## 2021-03-22 LAB — URINALYSIS, ROUTINE W REFLEX MICROSCOPIC
Bilirubin Urine: NEGATIVE
Glucose, UA: NEGATIVE
Hgb urine dipstick: NEGATIVE
Ketones, ur: NEGATIVE
Leukocytes,Ua: NEGATIVE
Nitrite: NEGATIVE
Protein, ur: NEGATIVE
Specific Gravity, Urine: 1.015 (ref 1.001–1.035)
pH: 7 (ref 5.0–8.0)

## 2021-03-23 DIAGNOSIS — M25512 Pain in left shoulder: Secondary | ICD-10-CM | POA: Diagnosis not present

## 2021-03-23 DIAGNOSIS — M6281 Muscle weakness (generalized): Secondary | ICD-10-CM | POA: Diagnosis not present

## 2021-03-23 DIAGNOSIS — M25522 Pain in left elbow: Secondary | ICD-10-CM | POA: Diagnosis not present

## 2021-03-25 DIAGNOSIS — G894 Chronic pain syndrome: Secondary | ICD-10-CM | POA: Diagnosis not present

## 2021-03-25 DIAGNOSIS — M069 Rheumatoid arthritis, unspecified: Secondary | ICD-10-CM | POA: Diagnosis not present

## 2021-03-25 DIAGNOSIS — M961 Postlaminectomy syndrome, not elsewhere classified: Secondary | ICD-10-CM | POA: Diagnosis not present

## 2021-03-25 DIAGNOSIS — M6281 Muscle weakness (generalized): Secondary | ICD-10-CM | POA: Diagnosis not present

## 2021-03-25 DIAGNOSIS — M25522 Pain in left elbow: Secondary | ICD-10-CM | POA: Diagnosis not present

## 2021-03-25 DIAGNOSIS — M47817 Spondylosis without myelopathy or radiculopathy, lumbosacral region: Secondary | ICD-10-CM | POA: Diagnosis not present

## 2021-03-25 DIAGNOSIS — M25512 Pain in left shoulder: Secondary | ICD-10-CM | POA: Diagnosis not present

## 2021-03-30 DIAGNOSIS — M25512 Pain in left shoulder: Secondary | ICD-10-CM | POA: Diagnosis not present

## 2021-03-30 DIAGNOSIS — M25522 Pain in left elbow: Secondary | ICD-10-CM | POA: Diagnosis not present

## 2021-03-30 DIAGNOSIS — M6281 Muscle weakness (generalized): Secondary | ICD-10-CM | POA: Diagnosis not present

## 2021-04-01 DIAGNOSIS — M6281 Muscle weakness (generalized): Secondary | ICD-10-CM | POA: Diagnosis not present

## 2021-04-01 DIAGNOSIS — M25512 Pain in left shoulder: Secondary | ICD-10-CM | POA: Diagnosis not present

## 2021-04-01 DIAGNOSIS — M25522 Pain in left elbow: Secondary | ICD-10-CM | POA: Diagnosis not present

## 2021-04-06 DIAGNOSIS — M0609 Rheumatoid arthritis without rheumatoid factor, multiple sites: Secondary | ICD-10-CM | POA: Diagnosis not present

## 2021-04-08 ENCOUNTER — Other Ambulatory Visit: Payer: Medicare Other | Admitting: *Deleted

## 2021-04-08 ENCOUNTER — Other Ambulatory Visit: Payer: Self-pay

## 2021-04-08 DIAGNOSIS — E782 Mixed hyperlipidemia: Secondary | ICD-10-CM

## 2021-04-08 DIAGNOSIS — M6281 Muscle weakness (generalized): Secondary | ICD-10-CM | POA: Diagnosis not present

## 2021-04-08 DIAGNOSIS — M25512 Pain in left shoulder: Secondary | ICD-10-CM | POA: Diagnosis not present

## 2021-04-08 DIAGNOSIS — M25522 Pain in left elbow: Secondary | ICD-10-CM | POA: Diagnosis not present

## 2021-04-08 LAB — HEPATIC FUNCTION PANEL
ALT: 24 IU/L (ref 0–32)
AST: 24 IU/L (ref 0–40)
Albumin: 4.5 g/dL (ref 3.8–4.9)
Alkaline Phosphatase: 63 IU/L (ref 44–121)
Bilirubin Total: 0.3 mg/dL (ref 0.0–1.2)
Bilirubin, Direct: <0.1 mg/dL (ref 0.00–0.40)
Total Protein: 7 g/dL (ref 6.0–8.5)

## 2021-04-08 LAB — LIPID PANEL
Chol/HDL Ratio: 1.7 ratio (ref 0.0–4.4)
Cholesterol, Total: 179 mg/dL (ref 100–199)
HDL: 105 mg/dL (ref >39)
LDL Chol Calc (NIH): 57 mg/dL (ref 0–99)
Triglycerides: 99 mg/dL (ref 0–149)
VLDL Cholesterol Cal: 17 mg/dL (ref 5–40)

## 2021-04-14 DIAGNOSIS — M797 Fibromyalgia: Secondary | ICD-10-CM | POA: Diagnosis not present

## 2021-04-14 DIAGNOSIS — M7702 Medial epicondylitis, left elbow: Secondary | ICD-10-CM | POA: Diagnosis not present

## 2021-04-14 DIAGNOSIS — M0609 Rheumatoid arthritis without rheumatoid factor, multiple sites: Secondary | ICD-10-CM | POA: Diagnosis not present

## 2021-04-14 DIAGNOSIS — M722 Plantar fascial fibromatosis: Secondary | ICD-10-CM | POA: Diagnosis not present

## 2021-04-14 DIAGNOSIS — M549 Dorsalgia, unspecified: Secondary | ICD-10-CM | POA: Diagnosis not present

## 2021-04-14 DIAGNOSIS — Z79899 Other long term (current) drug therapy: Secondary | ICD-10-CM | POA: Diagnosis not present

## 2021-04-14 DIAGNOSIS — M1990 Unspecified osteoarthritis, unspecified site: Secondary | ICD-10-CM | POA: Diagnosis not present

## 2021-04-14 DIAGNOSIS — E039 Hypothyroidism, unspecified: Secondary | ICD-10-CM | POA: Diagnosis not present

## 2021-04-15 DIAGNOSIS — M25522 Pain in left elbow: Secondary | ICD-10-CM | POA: Diagnosis not present

## 2021-04-15 DIAGNOSIS — M6281 Muscle weakness (generalized): Secondary | ICD-10-CM | POA: Diagnosis not present

## 2021-04-15 DIAGNOSIS — M25512 Pain in left shoulder: Secondary | ICD-10-CM | POA: Diagnosis not present

## 2021-04-22 DIAGNOSIS — G894 Chronic pain syndrome: Secondary | ICD-10-CM | POA: Diagnosis not present

## 2021-04-22 DIAGNOSIS — M069 Rheumatoid arthritis, unspecified: Secondary | ICD-10-CM | POA: Diagnosis not present

## 2021-04-22 DIAGNOSIS — M961 Postlaminectomy syndrome, not elsewhere classified: Secondary | ICD-10-CM | POA: Diagnosis not present

## 2021-04-22 DIAGNOSIS — M47817 Spondylosis without myelopathy or radiculopathy, lumbosacral region: Secondary | ICD-10-CM | POA: Diagnosis not present

## 2021-04-24 DIAGNOSIS — M6281 Muscle weakness (generalized): Secondary | ICD-10-CM | POA: Diagnosis not present

## 2021-04-24 DIAGNOSIS — M25522 Pain in left elbow: Secondary | ICD-10-CM | POA: Diagnosis not present

## 2021-04-24 DIAGNOSIS — M25512 Pain in left shoulder: Secondary | ICD-10-CM | POA: Diagnosis not present

## 2021-04-29 DIAGNOSIS — M25552 Pain in left hip: Secondary | ICD-10-CM | POA: Diagnosis not present

## 2021-04-29 DIAGNOSIS — M25652 Stiffness of left hip, not elsewhere classified: Secondary | ICD-10-CM | POA: Diagnosis not present

## 2021-04-29 DIAGNOSIS — M25651 Stiffness of right hip, not elsewhere classified: Secondary | ICD-10-CM | POA: Diagnosis not present

## 2021-04-29 DIAGNOSIS — M6281 Muscle weakness (generalized): Secondary | ICD-10-CM | POA: Diagnosis not present

## 2021-04-29 DIAGNOSIS — M25551 Pain in right hip: Secondary | ICD-10-CM | POA: Diagnosis not present

## 2021-05-01 ENCOUNTER — Other Ambulatory Visit: Payer: Self-pay | Admitting: Adult Health

## 2021-05-01 ENCOUNTER — Other Ambulatory Visit: Payer: Self-pay

## 2021-05-01 DIAGNOSIS — K2960 Other gastritis without bleeding: Secondary | ICD-10-CM

## 2021-05-01 DIAGNOSIS — M25552 Pain in left hip: Secondary | ICD-10-CM | POA: Diagnosis not present

## 2021-05-01 DIAGNOSIS — E782 Mixed hyperlipidemia: Secondary | ICD-10-CM

## 2021-05-01 DIAGNOSIS — M25651 Stiffness of right hip, not elsewhere classified: Secondary | ICD-10-CM | POA: Diagnosis not present

## 2021-05-01 DIAGNOSIS — M6281 Muscle weakness (generalized): Secondary | ICD-10-CM | POA: Diagnosis not present

## 2021-05-01 DIAGNOSIS — M25652 Stiffness of left hip, not elsewhere classified: Secondary | ICD-10-CM | POA: Diagnosis not present

## 2021-05-01 DIAGNOSIS — M25551 Pain in right hip: Secondary | ICD-10-CM | POA: Diagnosis not present

## 2021-05-01 MED ORDER — OLMESARTAN MEDOXOMIL 20 MG PO TABS: ORAL_TABLET | ORAL | 1 refills | Status: DC

## 2021-05-01 MED ORDER — OMEPRAZOLE 40 MG PO CPDR: 40.0000 mg | DELAYED_RELEASE_CAPSULE | Freq: Every day | ORAL | 2 refills | Status: DC

## 2021-05-04 DIAGNOSIS — M25651 Stiffness of right hip, not elsewhere classified: Secondary | ICD-10-CM | POA: Diagnosis not present

## 2021-05-04 DIAGNOSIS — M25551 Pain in right hip: Secondary | ICD-10-CM | POA: Diagnosis not present

## 2021-05-04 DIAGNOSIS — M25652 Stiffness of left hip, not elsewhere classified: Secondary | ICD-10-CM | POA: Diagnosis not present

## 2021-05-04 DIAGNOSIS — M6281 Muscle weakness (generalized): Secondary | ICD-10-CM | POA: Diagnosis not present

## 2021-05-04 DIAGNOSIS — M25552 Pain in left hip: Secondary | ICD-10-CM | POA: Diagnosis not present

## 2021-05-06 DIAGNOSIS — M6281 Muscle weakness (generalized): Secondary | ICD-10-CM | POA: Diagnosis not present

## 2021-05-06 DIAGNOSIS — M25652 Stiffness of left hip, not elsewhere classified: Secondary | ICD-10-CM | POA: Diagnosis not present

## 2021-05-06 DIAGNOSIS — M25651 Stiffness of right hip, not elsewhere classified: Secondary | ICD-10-CM | POA: Diagnosis not present

## 2021-05-06 DIAGNOSIS — M25552 Pain in left hip: Secondary | ICD-10-CM | POA: Diagnosis not present

## 2021-05-06 DIAGNOSIS — M25551 Pain in right hip: Secondary | ICD-10-CM | POA: Diagnosis not present

## 2021-05-13 DIAGNOSIS — M6281 Muscle weakness (generalized): Secondary | ICD-10-CM | POA: Diagnosis not present

## 2021-05-13 DIAGNOSIS — M25651 Stiffness of right hip, not elsewhere classified: Secondary | ICD-10-CM | POA: Diagnosis not present

## 2021-05-13 DIAGNOSIS — M25652 Stiffness of left hip, not elsewhere classified: Secondary | ICD-10-CM | POA: Diagnosis not present

## 2021-05-13 DIAGNOSIS — M25551 Pain in right hip: Secondary | ICD-10-CM | POA: Diagnosis not present

## 2021-05-13 DIAGNOSIS — M25552 Pain in left hip: Secondary | ICD-10-CM | POA: Diagnosis not present

## 2021-05-14 DIAGNOSIS — M25651 Stiffness of right hip, not elsewhere classified: Secondary | ICD-10-CM | POA: Diagnosis not present

## 2021-05-14 DIAGNOSIS — M25652 Stiffness of left hip, not elsewhere classified: Secondary | ICD-10-CM | POA: Diagnosis not present

## 2021-05-14 DIAGNOSIS — M25551 Pain in right hip: Secondary | ICD-10-CM | POA: Diagnosis not present

## 2021-05-14 DIAGNOSIS — M6281 Muscle weakness (generalized): Secondary | ICD-10-CM | POA: Diagnosis not present

## 2021-05-14 DIAGNOSIS — M25552 Pain in left hip: Secondary | ICD-10-CM | POA: Diagnosis not present

## 2021-05-18 DIAGNOSIS — M25551 Pain in right hip: Secondary | ICD-10-CM | POA: Diagnosis not present

## 2021-05-18 DIAGNOSIS — M6281 Muscle weakness (generalized): Secondary | ICD-10-CM | POA: Diagnosis not present

## 2021-05-18 DIAGNOSIS — M25552 Pain in left hip: Secondary | ICD-10-CM | POA: Diagnosis not present

## 2021-05-18 DIAGNOSIS — M25652 Stiffness of left hip, not elsewhere classified: Secondary | ICD-10-CM | POA: Diagnosis not present

## 2021-05-18 DIAGNOSIS — M25651 Stiffness of right hip, not elsewhere classified: Secondary | ICD-10-CM | POA: Diagnosis not present

## 2021-05-20 DIAGNOSIS — M6281 Muscle weakness (generalized): Secondary | ICD-10-CM | POA: Diagnosis not present

## 2021-05-20 DIAGNOSIS — M25652 Stiffness of left hip, not elsewhere classified: Secondary | ICD-10-CM | POA: Diagnosis not present

## 2021-05-20 DIAGNOSIS — M25552 Pain in left hip: Secondary | ICD-10-CM | POA: Diagnosis not present

## 2021-05-20 DIAGNOSIS — M25551 Pain in right hip: Secondary | ICD-10-CM | POA: Diagnosis not present

## 2021-05-20 DIAGNOSIS — M25651 Stiffness of right hip, not elsewhere classified: Secondary | ICD-10-CM | POA: Diagnosis not present

## 2021-05-26 DIAGNOSIS — M6281 Muscle weakness (generalized): Secondary | ICD-10-CM | POA: Diagnosis not present

## 2021-05-26 DIAGNOSIS — M25651 Stiffness of right hip, not elsewhere classified: Secondary | ICD-10-CM | POA: Diagnosis not present

## 2021-05-26 DIAGNOSIS — M25552 Pain in left hip: Secondary | ICD-10-CM | POA: Diagnosis not present

## 2021-05-26 DIAGNOSIS — M25652 Stiffness of left hip, not elsewhere classified: Secondary | ICD-10-CM | POA: Diagnosis not present

## 2021-05-26 DIAGNOSIS — M25551 Pain in right hip: Secondary | ICD-10-CM | POA: Diagnosis not present

## 2021-05-28 ENCOUNTER — Encounter: Payer: Medicare Other | Admitting: Internal Medicine

## 2021-05-28 DIAGNOSIS — M25652 Stiffness of left hip, not elsewhere classified: Secondary | ICD-10-CM | POA: Diagnosis not present

## 2021-05-28 DIAGNOSIS — M25651 Stiffness of right hip, not elsewhere classified: Secondary | ICD-10-CM | POA: Diagnosis not present

## 2021-05-28 DIAGNOSIS — M25552 Pain in left hip: Secondary | ICD-10-CM | POA: Diagnosis not present

## 2021-05-28 DIAGNOSIS — M25551 Pain in right hip: Secondary | ICD-10-CM | POA: Diagnosis not present

## 2021-05-28 DIAGNOSIS — M6281 Muscle weakness (generalized): Secondary | ICD-10-CM | POA: Diagnosis not present

## 2021-06-01 DIAGNOSIS — M0609 Rheumatoid arthritis without rheumatoid factor, multiple sites: Secondary | ICD-10-CM | POA: Diagnosis not present

## 2021-06-07 ENCOUNTER — Encounter: Payer: Self-pay | Admitting: Internal Medicine

## 2021-06-07 NOTE — Progress Notes (Signed)
Annual Screening/Preventative Visit & Comprehensive Evaluation &  Examination  Future Appointments  Date Time Provider [place]  06/08/2021   -   CPE 11:00 AM Unk Pinto, MD [facility] None  03/02/2022   -  Wellness  4:00 PM Liane Comber, NP [facility] None  06/08/2022   -  CPE 11:00 AM Unk Pinto, MD [facility] None        This very nice 60 y.o. MWF presents for a Screening /Preventative Visit & comprehensive evaluation and management of multiple medical co-morbidities.  Patient has been followed for HTN, ASCAD, Rheumatoid Arthritis,  HLD, Prediabetes, Hypothyroidism, Morbid Obesity and Vitamin D Deficiency.      In May 2021 she had a LD Screening Chest CT scan. Former smoker, 30 pack year history, quit in 2015.  On March 02, 2021 she was ordered for recommended f/u.                                            Since 2009, patient has been on SS Disability for Rheumatoid Arthritis and she is  on Inflectra  followed by Dr Dossie Der.  She has chronic neck & back pain attributed to RA & DDD and has a spinal stimulator and is on buprenorphine patches (not using)  per Dr. Vira Blanco / Pain Management. Patient also is on Doxycycline for Hidradenitis.          HTN predates since     . Patient's BP has been controlled at home. Chest CT scan 1i May 2021 showed Cor Aa Atherosclerosis.  Patient denies any cardiac symptoms as chest pain, palpitations, shortness of breath, dizziness or ankle swelling. Today's BP is at goal - 118/74.       Patient's hyperlipidemia is controlled with diet and Rosuvastatin  /Repatha. Patient denies myalgias or other medication SE's. Last lipids were at goal:  Lab Results  Component Value Date   CHOL 179 04/08/2021   HDL 105 04/08/2021   LDLCALC 57 04/08/2021   TRIG 99 04/08/2021   CHOLHDL 1.7 04/08/2021         Patient has hx/o Morbid Obesity and Prediabetes (A1c 5.9% / 2010) and  in 2019 was started on Metformin by Dr Suzette Battiest for Insulin Resistance.  patient denies reactive hypoglycemic symptoms, visual blurring, diabetic polys or paresthesias. Last A1c was normal & at goal:  Lab Results  Component Value Date   HGBA1C 5.4 05/14/2020                                            The patient had a Thyroidectomy in 1995 for thyroid cancer and has been on replacement /suppressive therapy since.          Finally, patient has history of Vitamin D Deficiency ("27" /2009 & "37" /2016) and last Vitamin D was  not at goal (70-100):  Lab Results  Component Value Date   VD25OH 53 05/14/2020     Current Outpatient Medications on File Prior to Visit  Medication Sig   acetaminophen (TYLENOL) 500 MG tablet Take 1 tablet every 6 hours as needed.   Acetylcysteine (N-ACETYL-L-CYSTEINE PO) Take 1 capsule daily.   albuterol (PROVENTIL) (2.5 MG/3ML) 0.083% nebulizer solution Take 3 mLs (2.5 mg total) by neb every 6 hours  albuterol HFA  inhaler INHALE 2 PUFFS EVERY 4 HOURS    amitriptyline 25 MG tablet Take 1 tablet every Morning & 3 tablets at Bedtime for Chronic Pain   ASPIRIN 81 PO Take  daily.   VITAMIN D 5000 UNITS TABS Take 10,000 Units daily.    Cinnamon 500 MG capsule Take daily.   CRANBERRY PO Take 1 tablet 2 (two) times daily.   diclofenac 1 % GEL diclofenac 1 % topical gel  APP 2 GRAMS TOPICALLY AA TWO TO TID   doxycycline 50 MG capsule Take  daily.   REPATHA SURECLICK) 144 MG/ML  Inject 1 pen into the skin every 14 (fourteen) days.   Eyelid Cleansers (AVENOVA) 0.01 % SOLN Place 1 application into both eyes 2 (two) times daily. Apply to eyelids BID   FEMRING 0.1 MG/24HR RING INSERT 1 RING VAGINALLY EVERY 3 MONTHS   fexofenadine 180 MG tablet Take daily.   BREO ELLIPTA 200-25 MCG/INH AEPB Use  1 Inhalation  Daily for Chronic Asthma   furosemide 40 MG tablet Take once a week.   inFLIXimab-dyyb (INFLECTRA IV) Inject into the vein.   L-GLUTAMINE PO Take 1 capsule by mouth daily.   l-methylfolate-B6-B12 (METANX) 3-35-2 MG TABS tablet Take  1 tablet by mouth daily.   Levomefolate Glucosamine (METHYLFOLATE PO) Take 5,000 mg by mouth daily.   LOTEMAX 0.5 % GEL INSTILL 1 DROP IN BOTH EYES 3 x   Magnesium Oxide 500 MG TABS Take 1,000 mg by daily.   Menaquinone-7 (VITAMIN K2) 100 MCG CAPS Take 1 capsule  daily.   MILK THISTLE EXTRACT PO Take 2 capsules daily.   montelukast (SINGULAIR) 10 MG tablet TAKE 1 TABLET DAILY    mupirocin ointment (BACTROBAN) 2 % APPLY EXTERNALLY TO THE AFFECTED AREA THREE TIMES DAILY FOR 10 DAYS   olmesartan (BENICAR) 20 MG tablet Take  1 tablet  Daily  for BP   Omega-3 Fatty Acids (FISH OIL) 1200 MG CAPS Take 2 capsules  daily.    omeprazole (PRILOSEC) 40 MG capsule Take 1 capsule (daily.   Polyethyl Glycol-Propyl Glycol 0.4-0.3 % SOLN Place 1 drop into both eyes 4  times daily.    RESTASIS 0.05 % ophthalmic emulsion Place 1 drop into both eyes daily.    rosuvastatin (CRESTOR) 40 MG tablet TAKE 1 TABLET DAILY    SYNTHROID 150 MCG tablet Take 1 tab daily except 1/2 tab on Sunday   triamcinolone (NASACORT) 55 MCG/ACT AERO nasal inhaler 1 spray by Both Nostrils route daily.   TURMERIC CURCUMIN     vitamin B-12 (CYANOCOBALAMIN) 1000 MCG tablet 1 tablet   Buprenorphine HCl (BELBUCA) 300 MCG FILM Place inside cheek 2 times daily.      Allergies  Allergen Reactions   Ciprofloxacin Hives and Swelling    SWELLING REACTION UNSPECIFIED    Levaquin [Levofloxacin] Hives   Lyrica [Pregabalin] Other (See Comments)    Altered mental status ALTERED MENTAL STATUS HALLUCINATIONS   Compazine [Prochlorperazine Edisylate]     UNSPECIFIED REACTION    Gabapentin     Diarrhea and nausea   Septra [Sulfamethoxazole-Trimethoprim]     itch   Zetia [Ezetimibe]     Stomach cramping   Hydrocodone Itching   Morphine And Related Itching   Penicillins Itching   Sulfa Antibiotics Itching     Past Medical History:  Diagnosis Date   Arthritis    OA, RA   Asthma    with a cold   Autoimmune deficiency syndrome ([place])  Back pain    Cancer ([place])    thyroid-1995   Family history of adverse reaction to anesthesia    mother problems with n/v   Fibromyalgia    GERD (gastroesophageal reflux disease)    Hepatitis    'A" when pt. was 12 yrs. old   Hiatal hernia    Hyperlipidemia    Hypertension    Hypothyroidism    Pneumonia    S/P total knee arthroplasty 02/16/2016     Health Maintenance  Topic Date Due   COVID-19 Vaccine (1) Never done   Zoster Vaccines- Shingrix (1 of 2) Never done   INFLUENZA VACCINE  04/06/2021   MAMMOGRAM  07/02/2021   COLONOSCOPY (Pts 45-39yrs Insurance coverage will need to be confirmed)  10/12/2021   TETANUS/TDAP  09/15/2022   Hepatitis C Screening  Completed   HIV Screening  Completed   HPV VACCINES  Aged Out   PAP SMEAR-Modifier  Discontinued     Immunization History  Administered Date(s) Administered   DTaP 09/15/2012, 09/15/2012   Influenza Inj Mdck Quad With Preservative 08/02/2017   Influenza Whole 09/06/2012   Influenza-Unspecified 05/07/2014, 05/20/2016, 08/02/2017   Pneumococcal Conjugate-13 07/05/2014   Pneumococcal Polysaccharide-23 03/02/2021   Zoster, Live 09/03/2013    Last Colon - 10/13/2011 - Dr Fuller Plan - recc 10 yr f/u - due Feb 2023   Last MGM - 07/02/2020    Past Surgical History:  Procedure Laterality Date   ANTERIOR CERVICAL DECOMP/DISCECTOMY FUSION N/A 05/30/2017   Procedure: Cervical five-six, Cervical six-seven Anterior discectomy with fusion and plate fixation;  Surgeon: Ditty, Kevan Ny, MD;  Location: [facility];  Service: Neurosurgery;  Laterality: N/A;   ANTERIOR LAT LUMBAR FUSION Left 01/26/2013   Procedure: ANTERIOR LATERAL LUMBAR FUSION 1 LEVEL;  Surgeon: Faythe Ghee, MD;  Location: [facility];  Service: Neurosurgery;  Laterality: Left;  lumbar four-five   KNEE ARTHROPLASTY Left 02/16/2016   Procedure: COMPUTER ASSISTED TOTAL KNEE ARTHROPLASTY;  Surgeon: Dereck Leep, MD;  Location: [facility];  Service: Orthopedics;   Laterality: Left;   KNEE SURGERY     3 on right knee, 1 on left-prior to TKR   LUMBAR PERCUTANEOUS PEDICLE SCREW 1 LEVEL Left 01/26/2013   Procedure: LUMBAR PERCUTANEOUS PEDICLE SCREW 1 LEVEL;  Surgeon: Faythe Ghee, MD;  Location: [facility];  Service: Neurosurgery;  Laterality: Left;  lumbar four-five   PILONIDAL CYST EXCISION     THYROIDECTOMY     TONSILLECTOMY     TOTAL KNEE ARTHROPLASTY     right   UPPER GASTROINTESTINAL ENDOSCOPY     VAGINAL HYSTERECTOMY       Family History  Problem Relation Age of Onset   Hypertension Father    Stroke Father 79   Heart attack Father 31   Lung cancer Father 51       smoker   Diabetes Brother    Asthma Brother    Other Daughter        XED CONNECTIVE TISSUE   Breast cancer Maternal Grandmother 91   Hypertension Maternal Grandmother    Stroke Paternal Grandmother        late in life   Heart attack Paternal Grandfather        late in life   Alzheimer's disease Paternal Grandfather        late onset   Colon cancer Neg Hx    Esophageal cancer Neg Hx    Stomach cancer Neg Hx    Rectal cancer Neg Hx  Social History   Tobacco Use   Smoking status: Former    Packs/day: 0.75    Years: 40.00    Pack years: 30.00    Types: Cigarettes    Quit date: 07/05/2015    Years since quitting: 5.9   Smokeless tobacco: Never  Substance Use Topics   Alcohol use: Yes    Alcohol/week: 0.0 standard drinks    Comment: rare   Drug use: No      ROS Constitutional: Denies fever, chills, weight loss/gain, headaches, insomnia,  night sweats, and change in appetite. Does c/o fatigue. Eyes: Denies redness, blurred vision, diplopia, discharge, itchy, watery eyes.  ENT: Denies discharge, congestion, post nasal drip, epistaxis, sore throat, earache, hearing loss, dental pain, Tinnitus, Vertigo, Sinus pain, snoring.  Cardio: Denies chest pain, palpitations, irregular heartbeat, syncope, dyspnea, diaphoresis, orthopnea, PND, claudication,  edema Respiratory: denies cough, dyspnea, DOE, pleurisy, hoarseness, laryngitis, wheezing.  Gastrointestinal: Denies dysphagia, heartburn, reflux, water brash, pain, cramps, nausea, vomiting, bloating, diarrhea, constipation, hematemesis, melena, hematochezia, jaundice, hemorrhoids Genitourinary: Denies dysuria, frequency, urgency, nocturia, hesitancy, discharge, hematuria, flank pain Breast: Breast lumps, nipple discharge, bleeding.  Musculoskeletal: Denies arthralgia, myalgia, stiffness, Jt. Swelling, pain, limp, and strain/sprain. Denies falls. Skin: Denies puritis, rash, hives, warts, acne, eczema, changing in skin lesion Neuro: No weakness, tremor, incoordination, spasms, paresthesia, pain Psychiatric: Denies confusion, memory loss, sensory loss. Denies Depression. Endocrine: Denies change in weight, skin, hair change, nocturia, and paresthesia, diabetic polys, visual blurring, hyper / hypo glycemic episodes.  Heme/Lymph: No excessive bleeding, bruising, enlarged lymph nodes.  Physical Exam  BP 118/74   Pulse 100   Temp 97.9 F (36.6 C)   Resp 17   Ht 5\' 2"  (1.575 m)   Wt 202 lb 3.2 oz (91.7 kg)   LMP  (LMP Unknown)   SpO2 96%   BMI 36.98 kg/m   General Appearance: Well nourished, well groomed and in no apparent distress.  Eyes: PERRLA, EOMs, conjunctiva no swelling or erythema, normal fundi and vessels. Sinuses: No frontal/maxillary tenderness ENT/Mouth: EACs patent / TMs  nl. Nares clear without erythema, swelling, mucoid exudates. Oral hygiene is good. No erythema, swelling, or exudate. Tongue normal, non-obstructing. Tonsils not swollen or erythematous. Hearing normal.  Neck: Supple, thyroid not palpable. No bruits, nodes or JVD. Respiratory: Respiratory effort normal.  BS equal and clear bilateral without rales, rhonci, wheezing or stridor. Cardio: Heart sounds are normal with regular rate and rhythm and no murmurs, rubs or gallops. Peripheral pulses are normal and equal  bilaterally without edema. No aortic or femoral bruits. Chest: symmetric with normal excursions and percussion. Breasts: Symmetric, without lumps, nipple discharge, retractions, or fibrocystic changes.  Abdomen: Flat, soft with bowel sounds active. Nontender, no guarding, rebound, hernias, masses, or organomegaly.  Lymphatics: Non tender without lymphadenopathy.  Musculoskeletal: Full ROM all peripheral extremities, joint stability, 5/5 strength, and normal gait. Skin: Warm and dry without rashes, lesions, cyanosis, clubbing or  ecchymosis.  Neuro: Cranial nerves intact, reflexes equal bilaterally. Normal muscle tone, no cerebellar symptoms. Sensation intact.  Pysch: Alert and oriented X 3, normal affect, Insight and Judgment appropriate.    Assessment and Plan  1. Annual Preventative Screening Examination   2. Essential hypertension  - EKG 12-Lead - Urinalysis, Routine w reflex microscopic - Microalbumin / creatinine urine ratio - CBC with Differential/Platelet - COMPLETE METABOLIC PANEL WITH GFR - Magnesium - TSH  3. Hyperlipidemia, mixed  - EKG 12-Lead - Lipid panel - TSH  4. Abnormal glucose  - EKG 12-Lead -  Hemoglobin A1c - Insulin, random  5. Vitamin D deficiency  - VITAMIN D 25 Hydroxy   6. Insulin resistance  - EKG 12-Lead - Hemoglobin A1c - Insulin, random  7. Hypothyroidism, unspecified type  - TSH  8. Rheumatoid arthritis involving multiple                                sites with positive rheumatoid factor ([place])   9. Aortic atherosclerosis ([place]) by Chest CT scan  May 2021  - EKG 12-Lead - Lipid panel  10. Sjogren's syndrome, with unspecified organ involvement ([place])   11. Class 2 severe obesity with serious comorbidity and BMI of 37.0 -37.9 (HCC)  - TSH  12. Screening for ischemic heart disease  - EKG 12-Lead  13. Family history of heart attack  - EKG 12-Lead  14. Former smoker, 40 + pack year, quit 2015  - EKG 12-Lead  15.  CAD in native artery  - EKG 12-Lead - Lipid panel  16. Medication management - Urinalysis, Routine w reflex microscopic - Microalbumin / creatinine urine ratio - CBC with Differential/Platelet - COMPLETE METABOLIC PANEL WITH GFR - Magnesium - Lipid panel - TSH - Hemoglobin A1c - Insulin, random - VITAMIN D 25 Hydroxy   17. Screening for colorectal cancer  - POC Hemoccult Bld/Stl           Patient was counseled in prudent diet to achieve/maintain BMI less than 25 for weight control, BP monitoring, regular exercise and medications. Discussed med's effects and SE's. Screening labs and tests as requested with regular follow-up as recommended. Over 40 minutes of exam, counseling, chart review and high complex critical decision making was performed.   Kirtland Bouchard, MD

## 2021-06-07 NOTE — Patient Instructions (Signed)
Due to recent changes in healthcare laws, you may see the results of your imaging and laboratory studies on MyChart before your provider has had a chance to review them.  We understand that in some cases there may be results that are confusing or concerning to you. Not all laboratory results come back in the same time frame and the provider may be waiting for multiple results in order to interpret others.  Please give [place] 48 hours in order for your provider to thoroughly review all the results before contacting the office for clarification of your results.   ++++++++++++++++++++++++++++++  Vit D  & Vit C 1,000 mg   are recommended to help protect  against the Covid-19 and other Corona viruses.    Also it's recommended  to take  Zinc 50 mg  to help  protect against the Covid-19   and best place to get  is also on [email or web address]  and don't pay more than 6-8 cents /pill !  ================================ Coronavirus (COVID-19) Are you at risk?  Are you at risk for the Coronavirus (COVID-19)?  To be considered HIGH RISK for Coronavirus (COVID-19), you have to meet the following criteria:  Traveled to [place], [place], [place], [place] or [place]; or in the [place] to [place], [place], [place]  or [place]; and have fever, cough, and shortness of breath within the last 2 weeks of travel OR Been in close contact with a person diagnosed with COVID-19 within the last 2 weeks and have  fever, cough,and shortness of breath  IF YOU DO NOT MEET THESE CRITERIA, YOU ARE CONSIDERED LOW RISK FOR COVID-19.  What to do if you are HIGH RISK for COVID-19?  If you are having a medical emergency, call 911. Seek medical care right away. Before you go to a doctor's office, urgent care or emergency department,  call ahead and tell them about your recent travel, contact with someone diagnosed with COVID-19   and your symptoms.  You should receive instructions from your physician's office regarding  next steps of care.  When you arrive at healthcare provider, tell the healthcare staff immediately you have returned from  visiting [place], [place], [place], [place] or [place]; or traveled in the [place] to [place], [place],  [place] or [place] in the last two weeks or you have been in close contact with a person diagnosed with  COVID-19 in the last 2 weeks.   Tell the health care staff about your symptoms: fever, cough and shortness of breath. After you have been seen by a medical provider, you will be either: Tested for (COVID-19) and discharged home on quarantine except to seek medical care if  symptoms worsen, and asked to  Stay home and avoid contact with others until you get your results (4-5 days)  Avoid travel on public transportation if possible (such as bus, train, or airplane) or Sent to the Emergency Department by EMS for evaluation, COVID-19 testing  and  possible admission depending on your condition and test results.  What to do if you are LOW RISK for COVID-19?  Reduce your risk of any infection by using the same precautions used for avoiding the common cold or flu:  Wash your hands often with soap and warm water for at least 20 seconds.  If soap and water are not readily available,  use an alcohol-based hand sanitizer with at least 60% alcohol.  If coughing or sneezing, cover your mouth and nose by coughing  or sneezing into the elbow areas of your shirt or coat,  into a tissue or into your sleeve (not your hands). Avoid shaking hands with others and consider head nods or verbal greetings only. Avoid touching your eyes, nose, or mouth with unwashed hands.  Avoid close contact with people who are sick. Avoid places or events with large numbers of people in one location, like concerts or sporting events. Carefully consider travel plans you have or are making. If you are planning any travel outside or inside the [place], visit the CDC's Travelers' Health webpage for  the latest health notices. If you have some symptoms but not all symptoms, continue to monitor at home and seek medical attention  if your symptoms worsen. If you are having a medical emergency, call 911. >>>>>>>>>>>>>>>>>>>>>>> Preventive Care for Adults  A healthy lifestyle and preventive care can promote health and wellness. Preventive health guidelines for women include the following key practices. A routine yearly physical is a good way to check with your health care provider about your health and preventive screening. It is a chance to share any concerns and updates on your health and to receive a thorough exam. Visit your dentist for a routine exam and preventive care every 6 months. Brush your teeth twice a day and floss once a day. Good oral hygiene prevents tooth decay and gum disease. The frequency of eye exams is based on your age, health, family medical history, use of contact lenses, and other factors. Follow your health care provider's recommendations for frequency of eye exams. Eat a healthy diet. Foods like vegetables, fruits, whole grains, low-fat dairy products, and lean protein foods contain the nutrients you need without too many calories. Decrease your intake of foods high in solid fats, added sugars, and salt. Eat the right amount of calories for you. Get information about a proper diet from your health care provider, if necessary. Regular physical exercise is one of the most important things you can do for your health. Most adults should get at least 150 minutes of moderate-intensity exercise (any activity that increases your heart rate and causes you to sweat) each week. In addition, most adults need muscle-strengthening exercises on 2 or more days a week. Maintain a healthy weight. The body mass index (BMI) is a screening tool to identify possible weight problems. It provides an estimate of body fat based on height and weight. Your health care provider can find your BMI and can  help you achieve or maintain a healthy weight. For adults 20 years and older: A BMI below 18.5 is considered underweight. A BMI of 18.5 to 24.9 is normal. A BMI of 25 to 29.9 is considered overweight. A BMI of 30 and above is considered obese. Maintain normal blood lipids and cholesterol levels by exercising and minimizing your intake of saturated fat. Eat a balanced diet with plenty of fruit and vegetables. Blood tests for lipids and cholesterol should begin at age 43 and be repeated every 5 years. If your lipid or cholesterol levels are high, you are over 50, or you are at high risk for heart disease, you may need your cholesterol levels checked more frequently. Ongoing high lipid and cholesterol levels should be treated with medicines if diet and exercise are not working. If you smoke, find out from your health care provider how to quit. If you do not use tobacco, do not start. Lung cancer screening is recommended for adults aged 11-80 years who are at high risk for developing  lung cancer because of a history of smoking. A yearly low-dose CT scan of the lungs is recommended for people who have at least a 30-pack-year history of smoking and are a current smoker or have quit within the past 15 years. A pack year of smoking is smoking an average of 1 pack of cigarettes a day for 1 year (for example: 1 pack a day for 30 years or 2 packs a day for 15 years). Yearly screening should continue until the smoker has stopped smoking for at least 15 years. Yearly screening should be stopped for people who develop a health problem that would prevent them from having lung cancer treatment. High blood pressure causes heart disease and increases the risk of stroke. Your blood pressure should be checked at least every 1 to 2 years. Ongoing high blood pressure should be treated with medicines if weight loss and exercise do not work. If you are 32-3 years old, ask your health care provider if you should take aspirin to  prevent strokes. Diabetes screening involves taking a blood sample to check your fasting blood sugar level. This should be done once every 3 years, after age 32, if you are within normal weight and without risk factors for diabetes. Testing should be considered at a younger age or be carried out more frequently if you are overweight and have at least 1 risk factor for diabetes. Breast cancer screening is essential preventive care for women. You should practice "breast self-awareness." This means understanding the normal appearance and feel of your breasts and may include breast self-examination. Any changes detected, no matter how small, should be reported to a health care provider. Women in their 45s and 30s should have a clinical breast exam (CBE) by a health care provider as part of a regular health exam every 1 to 3 years. After age 83, women should have a CBE every year. Starting at age 92, women should consider having a mammogram (breast X-ray test) every year. Women who have a family history of breast cancer should talk to their health care provider about genetic screening. Women at a high risk of breast cancer should talk to their health care providers about having an MRI and a mammogram every year. Breast cancer gene (BRCA)-related cancer risk assessment is recommended for women who have family members with BRCA-related cancers. BRCA-related cancers include breast, ovarian, tubal, and peritoneal cancers. Having family members with these cancers may be associated with an increased risk for harmful changes (mutations) in the breast cancer genes BRCA1 and BRCA2. Results of the assessment will determine the need for genetic counseling and BRCA1 and BRCA2 testing. Routine pelvic exams to screen for cancer are no longer recommended for nonpregnant women who are considered low risk for cancer of the pelvic organs (ovaries, uterus, and vagina) and who do not have symptoms. Ask your health care provider if a  screening pelvic exam is right for you. If you have had past treatment for cervical cancer or a condition that could lead to cancer, you need Pap tests and screening for cancer for at least 20 years after your treatment. If Pap tests have been discontinued, your risk factors (such as having a new sexual partner) need to be reassessed to determine if screening should be resumed. Some women have medical problems that increase the chance of getting cervical cancer. In these cases, your health care provider may recommend more frequent screening and Pap tests. Colorectal cancer can be detected and often prevented. Most routine colorectal  cancer screening begins at the age of 10 years and continues through age 19 years. However, your health care provider may recommend screening at an earlier age if you have risk factors for colon cancer. On a yearly basis, your health care provider may provide home test kits to check for hidden blood in the stool. Use of a small camera at the end of a tube, to directly examine the colon (sigmoidoscopy or colonoscopy), can detect the earliest forms of colorectal cancer. Talk to your health care provider about this at age 28, when routine screening begins.  Direct exam of the colon should be repeated every 5-10 years through age 71 years, unless early forms of pre-cancerous polyps or small growths are found. Hepatitis C blood testing is recommended for all people born from [birth year] through 1965 and any individual with known risks for hepatitis C.  Osteoporosis is a disease in which the bones lose minerals and strength with aging. This can result in serious bone fractures or breaks. The risk of osteoporosis can be identified using a bone density scan. Women ages 21 years and over and women at risk for fractures or osteoporosis should discuss screening with their health care providers. Ask your health care provider whether you should take a calcium supplement or vitamin D to reduce the rate  of osteoporosis. Menopause can be associated with physical symptoms and risks. Hormone replacement therapy is available to decrease symptoms and risks. You should talk to your health care provider about whether hormone replacement therapy is right for you. Use sunscreen. Apply sunscreen liberally and repeatedly throughout the day. You should seek shade when your shadow is shorter than you. Protect yourself by wearing long sleeves, pants, a wide-brimmed hat, and sunglasses year round, whenever you are outdoors. Once a month, do a whole body skin exam, using a mirror to look at the skin on your back. Tell your health care provider of new moles, moles that have irregular borders, moles that are larger than a pencil eraser, or moles that have changed in shape or color. Stay current with required vaccines (immunizations). Influenza vaccine. All adults should be immunized every year. Tetanus, diphtheria, and acellular pertussis (Td, Tdap) vaccine. Pregnant women should receive 1 dose of Tdap vaccine during each pregnancy. The dose should be obtained regardless of the length of time since the last dose. Immunization is preferred during the 27th-36th week of gestation. An adult who has not previously received Tdap or who does not know her vaccine status should receive 1 dose of Tdap. This initial dose should be followed by tetanus and diphtheria toxoids (Td) booster doses every 10 years. Adults with an unknown or incomplete history of completing a 3-dose immunization series with Td-containing vaccines should begin or complete a primary immunization series including a Tdap dose. Adults should receive a Td booster every 10 years. Varicella vaccine. An adult without evidence of immunity to varicella should receive 2 doses or a second dose if she has previously received 1 dose. Pregnant females who do not have evidence of immunity should receive the first dose after pregnancy. This first dose should be obtained before  leaving the health care facility. The second dose should be obtained 4-8 weeks after the first dose. Human papillomavirus (HPV) vaccine. Females aged 13-26 years who have not received the vaccine previously should obtain the 3-dose series. The vaccine is not recommended for use in pregnant females. However, pregnancy testing is not needed before receiving a dose. If a female is found to  be pregnant after receiving a dose, no treatment is needed. In that case, the remaining doses should be delayed until after the pregnancy. Immunization is recommended for any person with an immunocompromised condition through the age of 55 years if she did not get any or all doses earlier. During the 3-dose series, the second dose should be obtained 4-8 weeks after the first dose. The third dose should be obtained 24 weeks after the first dose and 16 weeks after the second dose. Zoster vaccine. One dose is recommended for adults aged 15 years or older unless certain conditions are present. Measles, mumps, and rubella (MMR) vaccine. Adults born before 20 generally are considered immune to measles and mumps. Adults born in [birth year] or later should have 1 or more doses of MMR vaccine unless there is a contraindication to the vaccine or there is laboratory evidence of immunity to each of the three diseases. A routine second dose of MMR vaccine should be obtained at least 28 days after the first dose for students attending postsecondary schools, health care workers, or international travelers. People who received inactivated measles vaccine or an unknown type of measles vaccine during 1963-1967 should receive 2 doses of MMR vaccine. People who received inactivated mumps vaccine or an unknown type of mumps vaccine before 1979 and are at high risk for mumps infection should consider immunization with 2 doses of MMR vaccine. For females of childbearing age, rubella immunity should be determined. If there is no evidence of immunity, females  who are not pregnant should be vaccinated. If there is no evidence of immunity, females who are pregnant should delay immunization until after pregnancy. Unvaccinated health care workers born before [birth year] who lack laboratory evidence of measles, mumps, or rubella immunity or laboratory confirmation of disease should consider measles and mumps immunization with 2 doses of MMR vaccine or rubella immunization with 1 dose of MMR vaccine. Pneumococcal 13-valent conjugate (PCV13) vaccine. When indicated, a person who is uncertain of her immunization history and has no record of immunization should receive the PCV13 vaccine. An adult aged 4 years or older who has certain medical conditions and has not been previously immunized should receive 1 dose of PCV13 vaccine. This PCV13 should be followed with a dose of pneumococcal polysaccharide (PPSV23) vaccine. The PPSV23 vaccine dose should be obtained at least 1 or more year(s) after the dose of PCV13 vaccine. An adult aged 15 years or older who has certain medical conditions and previously received 1 or more doses of PPSV23 vaccine should receive 1 dose of PCV13. The PCV13 vaccine dose should be obtained 1 or more years after the last PPSV23 vaccine dose.  Pneumococcal polysaccharide (PPSV23) vaccine. When PCV13 is also indicated, PCV13 should be obtained first. All adults aged 9 years and older should be immunized. An adult younger than age 14 years who has certain medical conditions should be immunized. Any person who resides in a nursing home or long-term care facility should be immunized. An adult smoker should be immunized. People with an immunocompromised condition and certain other conditions should receive both PCV13 and PPSV23 vaccines. People with human immunodeficiency virus (HIV) infection should be immunized as soon as possible after diagnosis. Immunization during chemotherapy or radiation therapy should be avoided. Routine use of PPSV23 vaccine is not  recommended for American Indians, [place] Natives, or people younger than 65 years unless there are medical conditions that require PPSV23 vaccine. When indicated, people who have unknown immunization and have no record of immunization should receive  PPSV23 vaccine. One-time revaccination 5 years after the first dose of PPSV23 is recommended for people aged 19-64 years who have chronic kidney failure, nephrotic syndrome, asplenia, or immunocompromised conditions. People who received 1-2 doses of PPSV23 before age 36 years should receive another dose of PPSV23 vaccine at age 52 years or later if at least 5 years have passed since the previous dose. Doses of PPSV23 are not needed for people immunized with PPSV23 at or after age 34 years.  Preventive Services / Frequency  Ages 74 to 1 years Blood pressure check. Lipid and cholesterol check. Lung cancer screening. / Every year if you are aged 53-80 years and have a 30-pack-year history of smoking and currently smoke or have quit within the past 15 years. Yearly screening is stopped once you have quit smoking for at least 15 years or develop a health problem that would prevent you from having lung cancer treatment. Clinical breast exam.** / Every year after age 16 years.  BRCA-related cancer risk assessment.** / For women who have family members with a BRCA-related cancer (breast, ovarian, tubal, or peritoneal cancers). Mammogram.** / Every year beginning at age 26 years and continuing for as long as you are in good health. Consult with your health care provider. Pap test.** / Every 3 years starting at age 19 years through age 52 or 29 years with a history of 3 consecutive normal Pap tests. HPV screening.** / Every 3 years from ages 18 years through ages 4 to 110 years with a history of 3 consecutive normal Pap tests. Fecal occult blood test (FOBT) of stool. / Every year beginning at age 70 years and continuing until age 53 years. You may not need to do this  test if you get a colonoscopy every 10 years. Flexible sigmoidoscopy or colonoscopy.** / Every 5 years for a flexible sigmoidoscopy or every 10 years for a colonoscopy beginning at age 66 years and continuing until age 44 years. Hepatitis C blood test.** / For all people born from [birth year] through 1965 and any individual with known risks for hepatitis C. Skin self-exam. / Monthly. Influenza vaccine. / Every year. Tetanus, diphtheria, and acellular pertussis (Tdap/Td) vaccine.** / Consult your health care provider. Pregnant women should receive 1 dose of Tdap vaccine during each pregnancy. 1 dose of Td every 10 years. Varicella vaccine.** / Consult your health care provider. Pregnant females who do not have evidence of immunity should receive the first dose after pregnancy. Zoster vaccine.** / 1 dose for adults aged 90 years or older. Pneumococcal 13-valent conjugate (PCV13) vaccine.** / Consult your health care provider. Pneumococcal polysaccharide (PPSV23) vaccine.** / 1 to 2 doses if you smoke cigarettes or if you have certain conditions. Meningococcal vaccine.** / Consult your health care provider. Hepatitis A vaccine.** / Consult your health care provider. Hepatitis B vaccine.** / Consult your health care provider. Screening for abdominal aortic aneurysm (AAA)  by ultrasound is recommended for people over 50 who have history of high blood pressure or who are current or former smokers. ++++++++++++++++++ Recommend Adult Low Dose Aspirin or  coated  Aspirin 81 mg daily  To reduce risk of Colon Cancer 40 %,  Skin Cancer 26 % ,  Melanoma 46%  and  Pancreatic cancer 60% +++++++++++++++++++ Vitamin D goal  is between 70-100.  Please make sure that you are taking your Vitamin D as directed.  It is very important as a natural anti-inflammatory  helping hair, skin, and nails, as well as reducing stroke and heart  attack risk.  It helps your bones and helps with mood. It also decreases numerous  cancer risks so please take it as directed.  Low Vit D is associated with a 200-300% higher risk for CANCER  and 200-300% higher risk for HEART   ATTACK  &  STROKE.   .....................................Marland Kitchen It is also associated with higher death rate at younger ages,  autoimmune diseases like Rheumatoid arthritis, Lupus, Multiple Sclerosis.    Also many other serious conditions, like depression, Alzheimer's Dementia, infertility, muscle aches, fatigue, fibromyalgia - just to name a few. ++++++++++++++++++ Recommend the book "The END of DIETING" by Dr Excell Seltzer  & the book "The END of DIABETES " by Dr Excell Seltzer At [email or web address] - get book & Audio CD's    Being diabetic has a  300% increased risk for heart attack, stroke, cancer, and alzheimer- type vascular dementia. It is very important that you work harder with diet by avoiding all foods that are white. Avoid white rice (brown & wild rice is OK), white potatoes (sweetpotatoes in moderation is OK), White bread or wheat bread or anything made out of white flour like bagels, donuts, rolls, buns, biscuits, cakes, pastries, cookies, pizza crust, and pasta (made from white flour & egg whites) - vegetarian pasta or spinach or wheat pasta is OK. Multigrain breads like Arnold's or [employer], or multigrain sandwich thins or flatbreads.  Diet, exercise and weight loss can reverse and cure diabetes in the early stages.  Diet, exercise and weight loss is very important in the control and prevention of complications of diabetes which affects every system in your body, ie. Brain - dementia/stroke, eyes - glaucoma/blindness, heart - heart attack/heart failure, kidneys - dialysis, stomach - gastric paralysis, intestines - malabsorption, nerves - severe painful neuritis, circulation - gangrene & loss of a leg(s), and finally cancer and Alzheimers.    I recommend avoid fried & greasy foods,  sweets/candy, white rice (brown or wild rice or Quinoa is OK), white  potatoes (sweet potatoes are OK) - anything made from white flour - bagels, doughnuts, rolls, buns, biscuits,white and wheat breads, pizza crust and traditional pasta made of white flour & egg white(vegetarian pasta or spinach or wheat pasta is OK).  Multi-grain bread is OK - like multi-grain flat bread or sandwich thins. Avoid alcohol in excess. Exercise is also important.    Eat all the vegetables you want - avoid meat, especially red meat and dairy - especially cheese.  Cheese is the most concentrated form of trans-fats which is the worst thing to clog up our arteries. Veggie cheese is OK which can be found in the fresh produce section at [employer] or [employer] or [employer]  ++++++++++++++++++++++ DASH Eating Plan  DASH stands for "Dietary Approaches to Stop Hypertension."   The DASH eating plan is a healthy eating plan that has been shown to reduce high blood pressure (hypertension). Additional health benefits may include reducing the risk of type 2 diabetes mellitus, heart disease, and stroke. The DASH eating plan may also help with weight loss. WHAT DO I NEED TO KNOW ABOUT THE DASH EATING PLAN? For the DASH eating plan, you will follow these general guidelines: Choose foods with a percent daily value for sodium of less than 5% (as listed on the food label). Use salt-free seasonings or herbs instead of table salt or sea salt. Check with your health care provider or pharmacist before using salt substitutes. Eat lower-sodium products, often labeled as "lower sodium" or "no  salt added." Eat fresh foods. Eat more vegetables, fruits, and low-fat dairy products. Choose whole grains. Look for the word "whole" as the first word in the ingredient list. Choose fish  Limit sweets, desserts, sugars, and sugary drinks. Choose heart-healthy fats. Eat veggie cheese  Eat more home-cooked food and less restaurant, buffet, and fast food. Limit fried foods. Cook foods using methods other than  frying. Limit canned vegetables. If you do use them, rinse them well to decrease the sodium. When eating at a restaurant, ask that your food be prepared with less salt, or no salt if possible.                      WHAT FOODS CAN I EAT? Read Dr Fara Olden Fuhrman's books on The End of Dieting & The End of Diabetes  Grains Whole grain or whole wheat bread. Brown rice. Whole grain or whole wheat pasta. Quinoa, bulgur, and whole grain cereals. Low-sodium cereals. Corn or whole wheat flour tortillas. Whole grain cornbread. Whole grain crackers. Low-sodium crackers.  Vegetables Fresh or frozen vegetables (raw, steamed, roasted, or grilled). Low-sodium or reduced-sodium tomato and vegetable juices. Low-sodium or reduced-sodium tomato sauce and paste. Low-sodium or reduced-sodium canned vegetables.   Fruits All fresh, canned (in natural juice), or frozen fruits.  Protein Products  All fish and seafood.  Dried beans, peas, or lentils. Unsalted nuts and seeds. Unsalted canned beans.  Dairy Low-fat dairy products, such as skim or 1% milk, 2% or reduced-fat cheeses, low-fat ricotta or cottage cheese, or plain low-fat yogurt. Low-sodium or reduced-sodium cheeses.  Fats and Oils Tub margarines without trans fats. Light or reduced-fat mayonnaise and salad dressings (reduced sodium). Avocado. Safflower, olive, or canola oils. Natural peanut or almond butter.  Other Unsalted popcorn and pretzels. The items listed above may not be a complete list of recommended foods or beverages. Contact your dietitian for more options.  ++++++++++++++++++  WHAT FOODS ARE NOT RECOMMENDED? Grains/ White flour or wheat flour White bread. White pasta. White rice. Refined cornbread. Bagels and croissants. Crackers that contain trans fat.  Vegetables  Creamed or fried vegetables. Vegetables in a . Regular canned vegetables. Regular canned tomato sauce and paste. Regular tomato and vegetable juices.  Fruits Dried fruits.  Canned fruit in light or heavy syrup. Fruit juice.  Meat and Other Protein Products Meat in general - RED meat & White meat.  Fatty cuts of meat. Ribs, chicken wings, all processed meats as bacon, sausage, bologna, salami, fatback, hot dogs, bratwurst and packaged luncheon meats.  Dairy Whole or 2% milk, cream, half-and-half, and cream cheese. Whole-fat or sweetened yogurt. Full-fat cheeses or blue cheese. Non-dairy creamers and whipped toppings. Processed cheese, cheese spreads, or cheese curds.  Condiments Onion and garlic salt, seasoned salt, table salt, and sea salt. Canned and packaged gravies. Worcestershire sauce. Tartar sauce. Barbecue sauce. Teriyaki sauce. Soy sauce, including reduced sodium. Steak sauce. Fish sauce. Oyster sauce. Cocktail sauce. Horseradish. Ketchup and mustard. Meat flavorings and tenderizers. Bouillon cubes. Hot sauce. Tabasco sauce. Marinades. Taco seasonings. Relishes.  Fats and Oils Butter, stick margarine, lard, shortening and bacon fat. Coconut, palm kernel, or palm oils. Regular salad dressings.  Pickles and olives. Salted popcorn and pretzels.  The items listed above may not be a complete list of foods and beverages to avoid.

## 2021-06-08 ENCOUNTER — Other Ambulatory Visit: Payer: Self-pay

## 2021-06-08 ENCOUNTER — Ambulatory Visit (INDEPENDENT_AMBULATORY_CARE_PROVIDER_SITE_OTHER): Payer: Medicare Other | Admitting: Internal Medicine

## 2021-06-08 VITALS — BP 118/74 | HR 100 | Temp 97.9°F | Resp 17 | Ht 62.0 in | Wt 202.2 lb

## 2021-06-08 DIAGNOSIS — Z136 Encounter for screening for cardiovascular disorders: Secondary | ICD-10-CM

## 2021-06-08 DIAGNOSIS — Z0000 Encounter for general adult medical examination without abnormal findings: Secondary | ICD-10-CM | POA: Diagnosis not present

## 2021-06-08 DIAGNOSIS — I2510 Atherosclerotic heart disease of native coronary artery without angina pectoris: Secondary | ICD-10-CM

## 2021-06-08 DIAGNOSIS — Z1212 Encounter for screening for malignant neoplasm of rectum: Secondary | ICD-10-CM

## 2021-06-08 DIAGNOSIS — I10 Essential (primary) hypertension: Secondary | ICD-10-CM | POA: Diagnosis not present

## 2021-06-08 DIAGNOSIS — E559 Vitamin D deficiency, unspecified: Secondary | ICD-10-CM

## 2021-06-08 DIAGNOSIS — Z6837 Body mass index (BMI) 37.0-37.9, adult: Secondary | ICD-10-CM

## 2021-06-08 DIAGNOSIS — E66812 Obesity, class 2: Secondary | ICD-10-CM

## 2021-06-08 DIAGNOSIS — Z87891 Personal history of nicotine dependence: Secondary | ICD-10-CM

## 2021-06-08 DIAGNOSIS — E8881 Metabolic syndrome: Secondary | ICD-10-CM

## 2021-06-08 DIAGNOSIS — E039 Hypothyroidism, unspecified: Secondary | ICD-10-CM | POA: Diagnosis not present

## 2021-06-08 DIAGNOSIS — M3500 Sicca syndrome, unspecified: Secondary | ICD-10-CM

## 2021-06-08 DIAGNOSIS — R7309 Other abnormal glucose: Secondary | ICD-10-CM

## 2021-06-08 DIAGNOSIS — E782 Mixed hyperlipidemia: Secondary | ICD-10-CM | POA: Diagnosis not present

## 2021-06-08 DIAGNOSIS — E88819 Insulin resistance, unspecified: Secondary | ICD-10-CM

## 2021-06-08 DIAGNOSIS — M0579 Rheumatoid arthritis with rheumatoid factor of multiple sites without organ or systems involvement: Secondary | ICD-10-CM

## 2021-06-08 DIAGNOSIS — Z8249 Family history of ischemic heart disease and other diseases of the circulatory system: Secondary | ICD-10-CM | POA: Diagnosis not present

## 2021-06-08 DIAGNOSIS — I700 Atherosclerosis of aorta: Secondary | ICD-10-CM

## 2021-06-08 DIAGNOSIS — Z79899 Other long term (current) drug therapy: Secondary | ICD-10-CM

## 2021-06-08 DIAGNOSIS — Z1211 Encounter for screening for malignant neoplasm of colon: Secondary | ICD-10-CM

## 2021-06-08 DIAGNOSIS — Z0001 Encounter for general adult medical examination with abnormal findings: Secondary | ICD-10-CM

## 2021-06-08 MED ORDER — TURMERIC CURCUMIN 5-1000 MG PO CAPS: ORAL_CAPSULE | ORAL | Status: AC

## 2021-06-09 LAB — LIPID PANEL
Cholesterol: 172 mg/dL (ref <200)
HDL: 93 mg/dL (ref >=50)
LDL Cholesterol (Calc): 62 mg/dL (calc)
Non-HDL Cholesterol (Calc): 79 mg/dL (calc) (ref <130)
Total CHOL/HDL Ratio: 1.8 (calc) (ref <5.0)
Triglycerides: 88 mg/dL (ref <150)

## 2021-06-09 LAB — COMPLETE METABOLIC PANEL WITH GFR
AG Ratio: 1.8 (calc) (ref 1.0–2.5)
ALT: 25 U/L (ref 6–29)
AST: 22 U/L (ref 10–35)
Albumin: 4.4 g/dL (ref 3.6–5.1)
Alkaline phosphatase (APISO): 56 U/L (ref 37–153)
BUN: 12 mg/dL (ref 7–25)
CO2: 26 mmol/L (ref 20–32)
Calcium: 9.5 mg/dL (ref 8.6–10.4)
Chloride: 103 mmol/L (ref 98–110)
Creat: 0.63 mg/dL (ref 0.50–1.05)
Globulin: 2.5 g/dL (calc) (ref 1.9–3.7)
Glucose, Bld: 94 mg/dL (ref 65–99)
Potassium: 4.4 mmol/L (ref 3.5–5.3)
Sodium: 138 mmol/L (ref 135–146)
Total Bilirubin: 0.4 mg/dL (ref 0.2–1.2)
Total Protein: 6.9 g/dL (ref 6.1–8.1)
eGFR: 101 mL/min/{1.73_m2} (ref >=60)

## 2021-06-09 LAB — URINALYSIS, ROUTINE W REFLEX MICROSCOPIC
Bacteria, UA: NONE SEEN /HPF
Bilirubin Urine: NEGATIVE
Glucose, UA: NEGATIVE
Hgb urine dipstick: NEGATIVE
Hyaline Cast: NONE SEEN /LPF
Ketones, ur: NEGATIVE
Leukocytes,Ua: NEGATIVE
Nitrite: NEGATIVE
Specific Gravity, Urine: 1.016 (ref 1.001–1.035)
pH: 8 (ref 5.0–8.0)

## 2021-06-09 LAB — CBC WITH DIFFERENTIAL/PLATELET
Absolute Monocytes: 378 cells/uL (ref 200–950)
Basophils Absolute: 19 cells/uL (ref 0–200)
Basophils Relative: 0.3 %
Eosinophils Absolute: 13 cells/uL — ABNORMAL LOW (ref 15–500)
Eosinophils Relative: 0.2 %
HCT: 40.1 % (ref 35.0–45.0)
Hemoglobin: 13.6 g/dL (ref 11.7–15.5)
Lymphs Abs: 3515 cells/uL (ref 850–3900)
MCH: 30 pg (ref 27.0–33.0)
MCHC: 33.9 g/dL (ref 32.0–36.0)
MCV: 88.3 fL (ref 80.0–100.0)
MPV: 10.4 fL (ref 7.5–12.5)
Monocytes Relative: 6 %
Neutro Abs: 2375 cells/uL (ref 1500–7800)
Neutrophils Relative %: 37.7 %
Platelets: 327 10*3/uL (ref 140–400)
RBC: 4.54 10*6/uL (ref 3.80–5.10)
RDW: 13.1 % (ref 11.0–15.0)
Total Lymphocyte: 55.8 %
WBC: 6.3 10*3/uL (ref 3.8–10.8)

## 2021-06-09 LAB — MICROALBUMIN / CREATININE URINE RATIO
Creatinine, Urine: 73 mg/dL (ref 20–275)
Microalb Creat Ratio: 92 mcg/mg creat — ABNORMAL HIGH (ref <30)
Microalb, Ur: 6.7 mg/dL

## 2021-06-09 LAB — MICROSCOPIC MESSAGE

## 2021-06-09 LAB — TSH: TSH: 0.51 mIU/L (ref 0.40–4.50)

## 2021-06-09 LAB — HEMOGLOBIN A1C
Hgb A1c MFr Bld: 5.3 % of total Hgb (ref <5.7)
Mean Plasma Glucose: 105 mg/dL
eAG (mmol/L): 5.8 mmol/L

## 2021-06-09 LAB — VITAMIN D 25 HYDROXY (VIT D DEFICIENCY, FRACTURES): Vit D, 25-Hydroxy: 62 ng/mL (ref 30–100)

## 2021-06-09 LAB — MAGNESIUM: Magnesium: 2 mg/dL (ref 1.5–2.5)

## 2021-06-09 LAB — INSULIN, RANDOM: Insulin: 9.3 u[IU]/mL

## 2021-06-09 NOTE — Progress Notes (Signed)
============================================================ -   Test results slightly outside the reference range are not unusual. If there is anything important, I will review this with you,  otherwise it is considered normal test values.  If you have further questions,  please do not hesitate to contact me at the office or via My Chart.  ============================================================ ============================================================  -  Total Chol = 172    & LDL Chol = 62   -  Both  Excellent   - Very low risk for Heart Attack  / Stroke ============================================================ ============================================================  -  TSH ~ ~ ~ Normal   ============================================================ ============================================================  -  A1c - Normal - No Diabetes - Great ! ============================================================ ============================================================  -  Vitamin D = 57 - Great   - Vitamin D goal is between 70-100.   - It is very important as a natural anti-inflammatory and helping the  immune system protect against viral infections, like the Covid-19   helping hair, skin, and nails, as well as reducing stroke and  heart attack risk.   - It helps your bones and helps with mood.  - It also decreases numerous cancer risks so please  take it as directed.   - Low Vit D is associated with a 200-300% higher risk for  CANCER   and 200-300% higher risk for HEART   ATTACK  &  STROKE.    - It is also associated with higher death rate at younger ages,   autoimmune diseases like Rheumatoid arthritis, Lupus,  Multiple Sclerosis.     - Also many other serious conditions, like depression, Alzheimer's  Dementia, infertility, muscle aches, fatigue, fibromyalgia   - just to name a  few. ============================================================ ============================================================  -  All Else - CBC - Kidneys - Electrolytes - Liver - Magnesium & Thyroid    - all  Normal / OK ============================================================ ============================================================

## 2021-06-10 DIAGNOSIS — M25552 Pain in left hip: Secondary | ICD-10-CM | POA: Diagnosis not present

## 2021-06-10 DIAGNOSIS — M6281 Muscle weakness (generalized): Secondary | ICD-10-CM | POA: Diagnosis not present

## 2021-06-10 DIAGNOSIS — M25551 Pain in right hip: Secondary | ICD-10-CM | POA: Diagnosis not present

## 2021-06-10 DIAGNOSIS — M25652 Stiffness of left hip, not elsewhere classified: Secondary | ICD-10-CM | POA: Diagnosis not present

## 2021-06-10 DIAGNOSIS — M25651 Stiffness of right hip, not elsewhere classified: Secondary | ICD-10-CM | POA: Diagnosis not present

## 2021-06-17 DIAGNOSIS — M25552 Pain in left hip: Secondary | ICD-10-CM | POA: Diagnosis not present

## 2021-06-17 DIAGNOSIS — M25652 Stiffness of left hip, not elsewhere classified: Secondary | ICD-10-CM | POA: Diagnosis not present

## 2021-06-17 DIAGNOSIS — M25551 Pain in right hip: Secondary | ICD-10-CM | POA: Diagnosis not present

## 2021-06-17 DIAGNOSIS — M25651 Stiffness of right hip, not elsewhere classified: Secondary | ICD-10-CM | POA: Diagnosis not present

## 2021-06-17 DIAGNOSIS — M6281 Muscle weakness (generalized): Secondary | ICD-10-CM | POA: Diagnosis not present

## 2021-06-22 DIAGNOSIS — M2550 Pain in unspecified joint: Secondary | ICD-10-CM | POA: Diagnosis not present

## 2021-06-22 DIAGNOSIS — M25551 Pain in right hip: Secondary | ICD-10-CM | POA: Diagnosis not present

## 2021-06-22 DIAGNOSIS — G894 Chronic pain syndrome: Secondary | ICD-10-CM | POA: Diagnosis not present

## 2021-06-22 DIAGNOSIS — Z79899 Other long term (current) drug therapy: Secondary | ICD-10-CM | POA: Diagnosis not present

## 2021-06-22 DIAGNOSIS — M6281 Muscle weakness (generalized): Secondary | ICD-10-CM | POA: Diagnosis not present

## 2021-06-22 DIAGNOSIS — M25651 Stiffness of right hip, not elsewhere classified: Secondary | ICD-10-CM | POA: Diagnosis not present

## 2021-06-22 DIAGNOSIS — M47817 Spondylosis without myelopathy or radiculopathy, lumbosacral region: Secondary | ICD-10-CM | POA: Diagnosis not present

## 2021-06-22 DIAGNOSIS — M25652 Stiffness of left hip, not elsewhere classified: Secondary | ICD-10-CM | POA: Diagnosis not present

## 2021-06-22 DIAGNOSIS — M961 Postlaminectomy syndrome, not elsewhere classified: Secondary | ICD-10-CM | POA: Diagnosis not present

## 2021-06-22 DIAGNOSIS — Z79891 Long term (current) use of opiate analgesic: Secondary | ICD-10-CM | POA: Diagnosis not present

## 2021-06-22 DIAGNOSIS — M25552 Pain in left hip: Secondary | ICD-10-CM | POA: Diagnosis not present

## 2021-06-30 ENCOUNTER — Other Ambulatory Visit: Payer: Self-pay | Admitting: Adult Health

## 2021-07-01 DIAGNOSIS — R7301 Impaired fasting glucose: Secondary | ICD-10-CM | POA: Diagnosis not present

## 2021-07-01 DIAGNOSIS — C73 Malignant neoplasm of thyroid gland: Secondary | ICD-10-CM | POA: Diagnosis not present

## 2021-07-01 DIAGNOSIS — E039 Hypothyroidism, unspecified: Secondary | ICD-10-CM | POA: Diagnosis not present

## 2021-07-09 DIAGNOSIS — M25552 Pain in left hip: Secondary | ICD-10-CM | POA: Diagnosis not present

## 2021-07-09 DIAGNOSIS — M25551 Pain in right hip: Secondary | ICD-10-CM | POA: Diagnosis not present

## 2021-07-09 DIAGNOSIS — R7301 Impaired fasting glucose: Secondary | ICD-10-CM | POA: Diagnosis not present

## 2021-07-09 DIAGNOSIS — M25651 Stiffness of right hip, not elsewhere classified: Secondary | ICD-10-CM | POA: Diagnosis not present

## 2021-07-09 DIAGNOSIS — E890 Postprocedural hypothyroidism: Secondary | ICD-10-CM | POA: Diagnosis not present

## 2021-07-09 DIAGNOSIS — E039 Hypothyroidism, unspecified: Secondary | ICD-10-CM | POA: Diagnosis not present

## 2021-07-09 DIAGNOSIS — C73 Malignant neoplasm of thyroid gland: Secondary | ICD-10-CM | POA: Diagnosis not present

## 2021-07-09 DIAGNOSIS — M6281 Muscle weakness (generalized): Secondary | ICD-10-CM | POA: Diagnosis not present

## 2021-07-09 DIAGNOSIS — I10 Essential (primary) hypertension: Secondary | ICD-10-CM | POA: Diagnosis not present

## 2021-07-09 DIAGNOSIS — M25652 Stiffness of left hip, not elsewhere classified: Secondary | ICD-10-CM | POA: Diagnosis not present

## 2021-07-15 ENCOUNTER — Ambulatory Visit
Admission: RE | Admit: 2021-07-15 | Discharge: 2021-07-15 | Disposition: A | Discharge status: Home / Self Care | Payer: Medicare Other | Source: Ambulatory Visit | Attending: Adult Health | Admitting: Adult Health

## 2021-07-15 ENCOUNTER — Other Ambulatory Visit: Payer: Self-pay

## 2021-07-15 DIAGNOSIS — M797 Fibromyalgia: Secondary | ICD-10-CM | POA: Diagnosis not present

## 2021-07-15 DIAGNOSIS — E039 Hypothyroidism, unspecified: Secondary | ICD-10-CM | POA: Diagnosis not present

## 2021-07-15 DIAGNOSIS — M0609 Rheumatoid arthritis without rheumatoid factor, multiple sites: Secondary | ICD-10-CM | POA: Diagnosis not present

## 2021-07-15 DIAGNOSIS — M549 Dorsalgia, unspecified: Secondary | ICD-10-CM | POA: Diagnosis not present

## 2021-07-15 DIAGNOSIS — Z87891 Personal history of nicotine dependence: Secondary | ICD-10-CM

## 2021-07-15 DIAGNOSIS — M7702 Medial epicondylitis, left elbow: Secondary | ICD-10-CM | POA: Diagnosis not present

## 2021-07-15 DIAGNOSIS — M722 Plantar fascial fibromatosis: Secondary | ICD-10-CM | POA: Diagnosis not present

## 2021-07-15 DIAGNOSIS — M1990 Unspecified osteoarthritis, unspecified site: Secondary | ICD-10-CM | POA: Diagnosis not present

## 2021-07-15 DIAGNOSIS — Z79899 Other long term (current) drug therapy: Secondary | ICD-10-CM | POA: Diagnosis not present

## 2021-07-20 DIAGNOSIS — Z1231 Encounter for screening mammogram for malignant neoplasm of breast: Secondary | ICD-10-CM | POA: Diagnosis not present

## 2021-07-20 LAB — HM MAMMOGRAPHY

## 2021-07-27 DIAGNOSIS — R7301 Impaired fasting glucose: Secondary | ICD-10-CM | POA: Diagnosis not present

## 2021-07-27 DIAGNOSIS — E890 Postprocedural hypothyroidism: Secondary | ICD-10-CM | POA: Diagnosis not present

## 2021-07-27 DIAGNOSIS — M0609 Rheumatoid arthritis without rheumatoid factor, multiple sites: Secondary | ICD-10-CM | POA: Diagnosis not present

## 2021-08-06 ENCOUNTER — Encounter: Payer: Self-pay | Admitting: Internal Medicine

## 2021-08-26 ENCOUNTER — Other Ambulatory Visit: Payer: Self-pay | Admitting: Adult Health

## 2021-08-26 DIAGNOSIS — J45909 Unspecified asthma, uncomplicated: Secondary | ICD-10-CM

## 2021-09-03 DIAGNOSIS — G894 Chronic pain syndrome: Secondary | ICD-10-CM | POA: Diagnosis not present

## 2021-09-03 DIAGNOSIS — M25559 Pain in unspecified hip: Secondary | ICD-10-CM | POA: Diagnosis not present

## 2021-09-03 DIAGNOSIS — Z79899 Other long term (current) drug therapy: Secondary | ICD-10-CM | POA: Diagnosis not present

## 2021-09-03 DIAGNOSIS — M19012 Primary osteoarthritis, left shoulder: Secondary | ICD-10-CM | POA: Diagnosis not present

## 2021-09-03 DIAGNOSIS — M47816 Spondylosis without myelopathy or radiculopathy, lumbar region: Secondary | ICD-10-CM | POA: Diagnosis not present

## 2021-09-03 DIAGNOSIS — Z79891 Long term (current) use of opiate analgesic: Secondary | ICD-10-CM | POA: Diagnosis not present

## 2021-09-09 ENCOUNTER — Ambulatory Visit: Payer: Medicare Other | Admitting: Nurse Practitioner

## 2021-09-09 NOTE — Progress Notes (Deleted)
FOLLOW UP 3 MONTH  Assessment:    Diagnoses and all orders for this visit:  Essential hypertension Continue medication Monitor blood pressure at home; call if consistently over 130/80 Continue DASH diet.   Reminder to go to the ER if any CP, SOB, nausea, dizziness, severe HA, changes vision/speech, left arm numbness and tingling and jaw pain. Check CBC  Mixed hyperlipidemia Continue medications; rosuvastatin 20 mg daily, zeita 10 mg daily (new) - never tried 40 mg rosuvastatin; consider trial of rosuvastatin 40 and hold zetia until next visit  Continue low cholesterol diet and exercise.  Check lipid panel.  Check CMP  Atherosclerosis of aorta Per CT 06/2018 Control blood pressure, cholesterol, glucose, increase exercise.   Intrinsic asthma Stable on inhalers/meds  Sjogren's syndrome, with unspecified organ involvement ([place]) On DMARD, followed by rheumatology  Postoperative hypothyroidism continue medications the same pending lab results reminded to take on an empty stomach 30-45mins before food.  check TSH level  Cervical spondylosis with radiculopathy Currently fairly managed; followed by Dr. Dossie Der and Dr. Cyndy Freeze  Rheumatoid arthritis involving multiple sites, unspecified rheumatoid factor presence ([place]) Managed by Dr. Dossie Der, on DMARD  Degeneration of intervertebral disc of lumbosacral region Continue NSAID PRN, follows rheum  Vitamin D deficiency At goal at recent check; continue to recommend supplementation for goal of 70-100 Defer vitamin D level  Prediabetes Controlled on metformin for hx of severe fasting glucose elevation Discussed disease and risks Discussed diet/exercise, weight management  A1C q64m; defer  Morbid obesity ([place])  Long discussion about weight loss, diet, and exercise Recommended diet heavy in fruits and veggies and low in animal meats, cheeses, and dairy products, appropriate calorie intake Discussed appropriate weight for height and  initial goal (185lb) Follow up at next visit  Hidradenitis suppurativa Doxycycline, endo follows  Former smoker 30 pack year, quit 2015 -lung cancer screening with low dose CT discussed as recommended by guidelines based on age, number of pack year history.  Discussed risks of screening including but not limited to false positives on xray, further testing or consultation with specialist, and possible false negative CT as well. Understanding expressed and wishes to proceed with CT testing. Order placed.   Medication management Continued   Further disposition pending results if labs check today. Discussed med's effects and SE's.   Over 30 minutes of face to face interview, exam, counseling, chart review, and critical decision making was performed.    Future Appointments  Date Time Provider [place]  09/11/2021 11:00 AM Magda Bernheim, NP [facility] None  09/28/2021 10:20 AM Nahser, Wonda Cheng, MD [facility]  03/02/2022  4:00 PM Liane Comber, NP [facility] None  06/08/2022 11:00 AM Unk Pinto, MD [facility] None     HPI  Autumn Frye is a 61 y.o. female who presents for 3 month follow up on HTN, HLD, RA, OA, Hypothyroidism, asthma, prediabetes, vitamin D defciency and weight.   Patient is followed by Dr Dossie Der  for Rheumatoid Arthritis and Sjogren's since 2009. She is also on orencia/abatacept. Chronic neck/lumbar pain, has spinal stimulator and on buprenorphine patches via Dr. Vira Blanco.   Dr. Chalmers Cater with endocrinology following for hypothyroid and hidradenitis suppurativa. She has been on doxycycline 50 mg daily for many years as prophylaxis for this issue.  Asthma is controlled by singulaire daily, BREO PRN if has a cold. No recent flares.   BMI is There is no height or weight on file to calculate BMI., she has been working on diet and exercise,  going to the GYM (water aerobics with water weights) 3 days a week, reports scale hasn't moved but has noted pant  size going down would like to get down to 140 lb or so, cutting out starch and sugar.  Has been following with Dr. Gaylord Shih "Adapt your life" program online via [facility], modified keto type diet 3 week plan, with greens, avocado, cruciferous veggies, plans to start this week.  Wt Readings from Last 3 Encounters:  06/08/21 202 lb 3.2 oz (91.7 kg)  03/02/21 198 lb (89.8 kg)  01/20/21 195 lb (88.5 kg)   Former smoker, 30 pack year history, quit in 2015, will start low dose CT scans.  She has aortic atherosclerosis per abd CT 06/2018.  Her blood pressure has been controlled at home, today their BP is   She does workout. She denies chest pain, shortness of breath, dizziness.   She is on cholesterol medication (rosuvastatin 20 mg daily, zetia 10 mg daily since last visit) and denies myalgias. Her LDL cholesterol is not at goal. The cholesterol last visit was:   Lab Results  Component Value Date   CHOL 172 06/08/2021   HDL 93 06/08/2021   LDLCALC 62 06/08/2021   TRIG 88 06/08/2021   CHOLHDL 1.8 06/08/2021    She has been working on diet and exercise for prediabetes which was controlled on metformin which recently stopped taking, and denies foot ulcerations, increased appetite, nausea, paresthesia of the feet, polydipsia, polyuria and visual disturbances. Last A1C in the office was:  Lab Results  Component Value Date   HGBA1C 5.3 06/08/2021   She is on thyroid medication. Her medication was not changed last visit, taking 137 mcg synthroid daily, recently stopped taking cytomel.  Lab Results  Component Value Date   TSH 0.51 06/08/2021   Last GFR: Lab Results  Component Value Date   GFRNONAA 98 03/02/2021   Patient is on Vitamin D supplement.   Lab Results  Component Value Date   VD25OH 62 06/08/2021      Medication Review: Current Outpatient Medications on File Prior to Visit  Medication Sig Dispense Refill   acetaminophen (TYLENOL) 500 MG tablet Take 1 tablet (500 mg total) by  mouth every 6 (six) hours as needed. 30 tablet 0   Acetylcysteine (N-ACETYL-L-CYSTEINE PO) Take 1 capsule by mouth daily.     albuterol (PROVENTIL) (2.5 MG/3ML) 0.083% nebulizer solution Take 3 mLs (2.5 mg total) by nebulization every 6 (six) hours as needed for wheezing or shortness of breath. 60 vial 1   albuterol (VENTOLIN HFA) 108 (90 Base) MCG/ACT inhaler INHALE 2 PUFFS INTO THE LUNGS EVERY 4 HOURS 15 MINUTES APART AS NEEDED FOR ASTHMA 40.2 g 1   amitriptyline (ELAVIL) 25 MG tablet Take 1 tablet every Morning & 3 tablets at Bedtime for Chronic Pain 360 tablet 3   ASPIRIN 81 PO Take by mouth daily.     Black Pepper-Turmeric (TURMERIC CURCUMIN) 01-999 MG CAPS Takes 1 capsule  Daily     Buprenorphine HCl (BELBUCA) 300 MCG FILM Place inside cheek 2 (two) times daily.     Cholecalciferol (VITAMIN D3) 5000 UNITS TABS Take 10,000 Units by mouth daily.      Cinnamon 500 MG capsule Take 500 mg by mouth daily.     CRANBERRY PO Take 1 tablet by mouth 2 (two) times daily.     diclofenac Sodium (VOLTAREN) 1 % GEL diclofenac 1 % topical gel  APP 2 GRAMS TOPICALLY AA TWO TO TID  doxycycline (VIBRAMYCIN) 50 MG capsule Take 50 mg by mouth daily.     Evolocumab (REPATHA SURECLICK) 845 MG/ML SOAJ Inject 1 pen into the skin every 14 (fourteen) days. 6 mL 3   Eyelid Cleansers (AVENOVA) 0.01 % SOLN Place 1 application into both eyes 2 (two) times daily. Apply to eyelids BID 1 Bottle 1   FEMRING 0.1 MG/24HR RING INSERT 1 RING VAGINALLY EVERY 3 MONTHS 1 each 3   fexofenadine (ALLEGRA) 180 MG tablet Take 180 mg by mouth daily.     fluticasone furoate-vilanterol (BREO ELLIPTA) 200-25 MCG/INH AEPB Use  1 Inhalation  Daily for Chronic Asthma 180 each 3   furosemide (LASIX) 40 MG tablet Take 40 mg by mouth once a week.     inFLIXimab-dyyb (INFLECTRA IV) Inject into the vein.     L-GLUTAMINE PO Take 1 capsule by mouth daily.     l-methylfolate-B6-B12 (METANX) 3-35-2 MG TABS tablet Take 1 tablet by mouth daily.      Levomefolate Glucosamine (METHYLFOLATE PO) Take 5,000 mg by mouth daily.     LOTEMAX 0.5 % GEL INSTILL 1 DROP IN BOTH EYES THREE TIMES DAILY 5 g 0   Magnesium Oxide 500 MG TABS Take 1,000 mg by mouth daily.     Menaquinone-7 (VITAMIN K2) 100 MCG CAPS Take 1 capsule by mouth daily.     MILK THISTLE EXTRACT PO Take 2 capsules by mouth daily.     montelukast (SINGULAIR) 10 MG tablet TAKE 1 TABLET BY MOUTH DAILY FOR ALLERGIES 90 tablet 3   mupirocin ointment (BACTROBAN) 2 % APPLY EXTERNALLY TO THE AFFECTED AREA THREE TIMES DAILY FOR 10 DAYS 22 g 3   olmesartan (BENICAR) 20 MG tablet Take  1 tablet  Daily  for BP 90 tablet 1   Omega-3 Fatty Acids (FISH OIL) 1200 MG CAPS Take 2 capsules by mouth daily.      omeprazole (PRILOSEC) 40 MG capsule Take 1 capsule (40 mg total) by mouth daily. 90 capsule 2   Polyethyl Glycol-Propyl Glycol 0.4-0.3 % SOLN Place 1 drop into both eyes 4 (four) times daily.      RESTASIS 0.05 % ophthalmic emulsion Place 1 drop into both eyes daily.   3   rosuvastatin (CRESTOR) 40 MG tablet TAKE 1 TABLET BY MOUTH DAILY FOR CHOLESTEROL 90 tablet 1   SYNTHROID 150 MCG tablet Take 1 tab daily except 1/2 tab on Sunday. Take on empty stomach with water only; no other food/drink/pills for 30-60 min. 90 tablet 3   triamcinolone (NASACORT) 55 MCG/ACT AERO nasal inhaler 1 spray by Both Nostrils route daily.     vitamin B-12 (CYANOCOBALAMIN) 1000 MCG tablet 1 tablet     No current facility-administered medications on file prior to visit.    Allergies  Allergen Reactions   Ciprofloxacin Hives and Swelling    SWELLING REACTION UNSPECIFIED    Levaquin [Levofloxacin] Hives   Lyrica [Pregabalin] Other (See Comments)    Altered mental status ALTERED MENTAL STATUS HALLUCINATIONS   Compazine [Prochlorperazine Edisylate]     UNSPECIFIED REACTION    Gabapentin     Diarrhea and nausea   Septra [Sulfamethoxazole-Trimethoprim]     itch   Zetia [Ezetimibe]     Stomach cramping    Hydrocodone Itching   Morphine And Related Itching   Penicillins Itching   Sulfa Antibiotics Itching    Current Problems (verified) Patient Active Problem List   Diagnosis Date Noted   CAD in native artery 12/03/2020   Carotid stenosis 10/25/2020  Insulin resistance 05/12/2020   Former smoker, 30 pack year, quit 2015 01/09/2020   Aortic atherosclerosis ([place]) by Chest CT scan  May 2021 01/08/2020   Hidradenitis suppurativa 05/16/2019   Morbid obesity ([place]) 11/08/2017   Abnormal glucose 08/02/2017   Cervical spondylosis with radiculopathy 05/30/2017   Rheumatoid arthritis involving multiple sites with positive rheumatoid factor ([place]) 11/14/2015   Sjogren's disease ([place]) 07/05/2014   Medication management 03/26/2014   Hyperlipidemia, mixed 09/24/2013   Vitamin D deficiency 09/24/2013   Intrinsic asthma    Essential hypertension    Hypothyroidism    Degeneration of intervertebral disc of lumbosacral region 05/23/2008    Screening Tests Immunization History  Administered Date(s) Administered   DTaP 09/15/2012, 09/15/2012   Influenza Inj Mdck Quad With Preservative 08/02/2017   Influenza Whole 09/06/2012   Influenza-Unspecified 05/07/2014, 05/20/2016, 08/02/2017   Pneumococcal Conjugate-13 07/05/2014   Pneumococcal Polysaccharide-23 03/02/2021   Zoster, Live 09/03/2013    Colonoscopy: 2013 - 10 year follow up MGM: 06/2020 Stress: 2004 DEXA: 2013 Echo: 2004 CXr: 02/2016 PAP: reports had in 01/2019 by GYN  Former smoker, 30 pack year hx, quit 2015 Ct chest: ordered today   Tdap: 2014 Flu vaccine: 2018 Covid 19: declines   All other vaccines UTD  Names of Other Physician/Practitioners you currently use: 1. [facility] here for primary care 2. [place], eye doctor, last visit 2020 - mild cataracts 3. [facility], dentist, last visit 2020, goes q76m, due    Patient Care Team: Unk Pinto, MD as PCP - General  (Internal Medicine) Ladene Artist, MD as Consulting Physician (Gastroenterology) Newt Minion, MD as Consulting Physician (Orthopedic Surgery) Unice Bailey, MD as Consulting Physician (Rheumatology) Karie Chimera, MD as Consulting Physician (Neurosurgery) Druscilla Brownie, MD as Consulting Physician (Dermatology) Druscilla Brownie, MD as Consulting Physician (Dermatology)  SURGICAL HISTORY She  has a past surgical history that includes Thyroidectomy; Upper gastrointestinal endoscopy; Total knee arthroplasty; Vaginal hysterectomy; Knee surgery; Pilonidal cyst excision; Tonsillectomy; Anterior lat lumbar fusion (Left, 01/26/2013); Lumbar percutaneous pedicle screw 1 level (Left, 01/26/2013); Knee Arthroplasty (Left, 02/16/2016); and Anterior cervical decomp/discectomy fusion (N/A, 05/30/2017).   FAMILY HISTORY Her family history includes Alzheimer's disease in her paternal grandfather; Asthma in her brother; Breast cancer (age of onset: 21) in her maternal grandmother; Diabetes in her brother; Heart attack in her paternal grandfather; Heart attack (age of onset: 5) in her father; Hypertension in her father and maternal grandmother; Lung cancer (age of onset: 42) in her father; Other in her daughter; Stroke in her paternal grandmother; Stroke (age of onset: 72) in her father.   SOCIAL HISTORY She  reports that she quit smoking about 6 years ago. Her smoking use included cigarettes. She has a 30.00 pack-year smoking history. She has never used smokeless tobacco. She reports current alcohol use. She reports that she does not use drugs.   Review of Systems  Constitutional:  Negative for malaise/fatigue and weight loss.  HENT:  Negative for hearing loss and tinnitus.   Eyes:  Negative for blurred vision and double vision.  Respiratory:  Negative for cough, sputum production, shortness of breath and wheezing.   Cardiovascular:  Negative for chest pain, palpitations, orthopnea, claudication,  leg swelling and PND.  Gastrointestinal:  Negative for abdominal pain, blood in stool, constipation, diarrhea, heartburn, melena, nausea and vomiting.  Genitourinary: Negative.   Musculoskeletal:  Positive for joint pain (wrists, follows rheumatology). Negative for falls and myalgias.  Skin:  Negative for rash.  Neurological:  Negative for dizziness, tingling, sensory change, weakness and headaches.  Endo/Heme/Allergies:  Negative for polydipsia.  Psychiatric/Behavioral: Negative.  Negative for depression, memory loss, substance abuse and suicidal ideas. The patient is not nervous/anxious and does not have insomnia.   All other systems reviewed and are negative.   Objective:     There were no vitals filed for this visit.  There is no height or weight on file to calculate BMI.  General Appearance: Well nourished, in no apparent distress. Eyes: PERRLA, EOMs, conjunctiva no swelling or erythema Sinuses: No Frontal/maxillary tenderness ENT/Mouth: Ext aud canals clear, TMs without erythema, bulging. No erythema, swelling, or exudate on post pharynx.  Tonsils not swollen or erythematous. Hearing normal.  Neck: Supple, thyroid normal.  Respiratory: Respiratory effort normal, BS equal bilaterally without rales, rhonchi, wheezing or stridor.  Cardio: RRR with no MRGs. Brisk peripheral pulses without edema.  Abdomen: Soft, + BS.  Non tender, no guarding, rebound, hernias, masses. Lymphatics: Non tender without lymphadenopathy.  Musculoskeletal: Full ROM, 5/5 strength, Mildly antalgic gait. R wrist with some warmth; non-erythematous, no obvious deformity Skin: Warm, dry; intact without rashes, lesions, ecchynosis Neuro: Cranial nerves intact. No cerebellar symptoms.  Psych: Awake and oriented X 3, normal affect, Insight and Judgment appropriate.     Garnet Sierras, Laqueta Jean, DNP [facility] 09/09/2021  10:28 AM

## 2021-09-10 NOTE — Progress Notes (Signed)
FOLLOW UP 3 MONTH  Assessment:    Diagnoses and all orders for this visit:  Essential hypertension Continue medication Monitor blood pressure at home; call if consistently over 130/80 Continue DASH diet.   Reminder to go to the ER if any CP, SOB, nausea, dizziness, severe HA, changes vision/speech, left arm numbness and tingling and jaw pain. - CBC  Mixed hyperlipidemia Continue medications; rosuvastatin 40 mg daily, repatha 140 mg every other week Continue low cholesterol diet and exercise.  Check lipid panel.  - CMP  Atherosclerosis of aorta Per CT 06/2018 Control blood pressure, cholesterol, glucose, increase exercise.   Intrinsic asthma Stable on inhalers/meds  Sjogren's syndrome, with unspecified organ involvement ([place]) On Avsola, followed by rheumatology  Postoperative hypothyroidism continue medications the same pending lab results reminded to take on an empty stomach 30-10mins before food.  check TSH level  Cervical spondylosis with radiculopathy Currently fairly managed; followed by Dr. Dossie Der and Dr. Cyndy Freeze  Rheumatoid arthritis involving multiple sites, unspecified rheumatoid factor presence ([place]) Managed by Dr. Dossie Der, on DMARD  Degeneration of intervertebral disc of lumbosacral region Continue NSAID PRN, follows rheum  Vitamin D deficiency At goal at recent check; continue to recommend supplementation for goal of 70-100 Defer vitamin D level  Prediabetes Controlled on metformin for hx of severe fasting glucose elevation Discussed disease and risks Discussed diet/exercise, weight management  A1C q50m; defer  Morbid obesity ([place])  Long discussion about weight loss, diet, and exercise Recommended diet heavy in fruits and veggies and low in animal meats, cheeses, and dairy products, appropriate calorie intake Discussed appropriate weight for height and initial goal (185lb) Follow up at next visit  Hidradenitis suppurativa Has been off Doxycyline but  area are recurring since starting new injectable Avsola Doxycycline 50 mg 1 tab po qd  Former smoker 30 pack year, quit 2015 -lung cancer screening with low dose CT done 07/15/21 no new concerning changes  Medication management Continued  Viral URI Mucinex 1200 mg BID Nasacort daily Restart Allegra Push fluid, immune support with Vit D, Vit c and zinc.  Further disposition pending results if labs check today. Discussed med's effects and SE's.   Over 30 minutes of face to face interview, exam, counseling, chart review, and critical decision making was performed.    Future Appointments  Date Time Provider [place]  09/28/2021 10:20 AM Nahser, Wonda Cheng, MD [facility]  03/02/2022  4:00 PM Liane Comber, NP [facility] None  06/08/2022 11:00 AM Unk Pinto, MD [facility] None     HPI  Autumn Frye is a 61 y.o. female who presents for 3 month follow up on HTN, HLD, RA, OA, Hypothyroidism, asthma, prediabetes, vitamin D defciency and weight.  Pt has a history of Hidradenitis suppurativa and was using Doxycycline and it was controlled for a long time and so stopped.  This occurred after  Avsola infusion. Is having a lot of fatigue.  She had covid a month ago.  Is having tenderness in right frontal sinus.  Has congestion with white nasal drainage .  Denies cough, fever, sore throat, myalgias.  Patient is followed by Dr Dossie Der  for Rheumatoid Arthritis and Sjogren's since 2009. She is also on orencia/abatacept. Chronic neck/lumbar pain, has spinal stimulator and on buprenorphine patches via Dr. Vira Blanco.   Dr. Chalmers Cater with endocrinology following for hypothyroid and hidradenitis suppurativa. She was recently put back on Cytomel. Is having some hot flashes since starting the medication.  Asthma is controlled by singulair daily, BREO PRN  if has a cold. No recent flares.   BMI is Body mass index is 36.8 kg/m., she has been working on diet and exercise, going to the  [employer] (water aerobics with water weights) 3 days a week, reports scale hasn't moved but has noted pant size going down would like to get down to 140 lb or so, cutting out starch and sugar.  Wt Readings from Last 3 Encounters:  09/14/21 201 lb 3.2 oz (91.3 kg)  06/08/21 202 lb 3.2 oz (91.7 kg)  03/02/21 198 lb (89.8 kg)   Former smoker, 30 pack year history, quit in 2015, low dose CT 07/15/21  She has aortic atherosclerosis per abd CT 06/2018.   Her blood pressure has been controlled at home, today their BP is BP: (!) 144/80 BP Readings from Last 3 Encounters:  09/14/21 (!) 144/80  06/08/21 118/74  03/02/21 132/70    She does workout. She denies chest pain, shortness of breath, dizziness.   She is on cholesterol medication (rosuvastatin 40 mg and Repatha 140 mg every other week) and denies myalgias. Her LDL cholesterol is not at goal. The cholesterol last visit was:   Lab Results  Component Value Date   CHOL 172 06/08/2021   HDL 93 06/08/2021   LDLCALC 62 06/08/2021   TRIG 88 06/08/2021   CHOLHDL 1.8 06/08/2021    She has been working on diet and exercise for prediabetes which was controlled on metformin which recently stopped taking, and denies foot ulcerations, increased appetite, nausea, paresthesia of the feet, polydipsia, polyuria and visual disturbances. Last A1C in the office was:  Lab Results  Component Value Date   HGBA1C 5.3 06/08/2021   She is on thyroid medication. Her medication was not changed last visit, taking 150 mcg synthroid daily Lab Results  Component Value Date   TSH 0.51 06/08/2021   Last GFR: Lab Results  Component Value Date   GFRNONAA 98 03/02/2021   Patient is on Vitamin D supplement.   Lab Results  Component Value Date   VD25OH 62 06/08/2021      Medication Review: Current Outpatient Medications on File Prior to Visit  Medication Sig Dispense Refill   acetaminophen (TYLENOL) 500 MG tablet Take 1 tablet (500 mg total) by mouth every 6 (six)  hours as needed. 30 tablet 0   Acetylcysteine (N-ACETYL-L-CYSTEINE PO) Take 1 capsule by mouth daily.     albuterol (PROVENTIL) (2.5 MG/3ML) 0.083% nebulizer solution Take 3 mLs (2.5 mg total) by nebulization every 6 (six) hours as needed for wheezing or shortness of breath. 60 vial 1   albuterol (VENTOLIN HFA) 108 (90 Base) MCG/ACT inhaler INHALE 2 PUFFS INTO THE LUNGS EVERY 4 HOURS 15 MINUTES APART AS NEEDED FOR ASTHMA 40.2 g 1   amitriptyline (ELAVIL) 25 MG tablet Take 1 tablet every Morning & 3 tablets at Bedtime for Chronic Pain 360 tablet 3   ASPIRIN 81 PO Take by mouth daily.     Black Pepper-Turmeric (TURMERIC CURCUMIN) 01-999 MG CAPS Takes 1 capsule  Daily     Cholecalciferol (VITAMIN D3) 5000 UNITS TABS Take 10,000 Units by mouth daily.      Cinnamon 500 MG capsule Take 500 mg by mouth daily.     CRANBERRY PO Take 1 tablet by mouth 2 (two) times daily.     diclofenac Sodium (VOLTAREN) 1 % GEL diclofenac 1 % topical gel  APP 2 GRAMS TOPICALLY AA TWO TO TID     Evolocumab (REPATHA SURECLICK) 027 MG/ML SOAJ Inject  1 pen into the skin every 14 (fourteen) days. 6 mL 3   Eyelid Cleansers (AVENOVA) 0.01 % SOLN Place 1 application into both eyes 2 (two) times daily. Apply to eyelids BID 1 Bottle 1   FEMRING 0.1 MG/24HR RING INSERT 1 RING VAGINALLY EVERY 3 MONTHS 1 each 3   fexofenadine (ALLEGRA) 180 MG tablet Take 180 mg by mouth daily.     fluticasone furoate-vilanterol (BREO ELLIPTA) 200-25 MCG/INH AEPB Use  1 Inhalation  Daily for Chronic Asthma 180 each 3   furosemide (LASIX) 40 MG tablet Take 40 mg by mouth once a week.     inFLIXimab-axxq (AVSOLA IV) Inject into the vein.     L-GLUTAMINE PO Take 1 capsule by mouth daily.     l-methylfolate-B6-B12 (METANX) 3-35-2 MG TABS tablet Take 1 tablet by mouth daily.     Levomefolate Glucosamine (METHYLFOLATE PO) Take 5,000 mg by mouth daily.     LOTEMAX 0.5 % GEL INSTILL 1 DROP IN BOTH EYES THREE TIMES DAILY 5 g 0   Magnesium Oxide 500 MG TABS  Take 1,000 mg by mouth daily.     MILK THISTLE EXTRACT PO Take 2 capsules by mouth daily.     montelukast (SINGULAIR) 10 MG tablet TAKE 1 TABLET BY MOUTH DAILY FOR ALLERGIES 90 tablet 3   mupirocin ointment (BACTROBAN) 2 % APPLY EXTERNALLY TO THE AFFECTED AREA THREE TIMES DAILY FOR 10 DAYS 22 g 3   olmesartan (BENICAR) 20 MG tablet Take  1 tablet  Daily  for BP 90 tablet 1   Omega-3 Fatty Acids (FISH OIL) 1200 MG CAPS Take 2 capsules by mouth daily.      Polyethyl Glycol-Propyl Glycol 0.4-0.3 % SOLN Place 1 drop into both eyes 4 (four) times daily.      RESTASIS 0.05 % ophthalmic emulsion Place 1 drop into both eyes daily.   3   rosuvastatin (CRESTOR) 40 MG tablet TAKE 1 TABLET BY MOUTH DAILY FOR CHOLESTEROL 90 tablet 1   SYNTHROID 150 MCG tablet Take 1 tab daily except 1/2 tab on Sunday. Take on empty stomach with water only; no other food/drink/pills for 30-60 min. 90 tablet 3   triamcinolone (NASACORT) 55 MCG/ACT AERO nasal inhaler 1 spray by Both Nostrils route daily.     vitamin B-12 (CYANOCOBALAMIN) 1000 MCG tablet 1 tablet     No current facility-administered medications on file prior to visit.    Allergies  Allergen Reactions   Ciprofloxacin Hives and Swelling    SWELLING REACTION UNSPECIFIED    Levaquin [Levofloxacin] Hives   Lyrica [Pregabalin] Other (See Comments)    Altered mental status ALTERED MENTAL STATUS HALLUCINATIONS   Compazine [Prochlorperazine Edisylate]     UNSPECIFIED REACTION    Gabapentin     Diarrhea and nausea   Septra [Sulfamethoxazole-Trimethoprim]     itch   Zetia [Ezetimibe]     Stomach cramping   Hydrocodone Itching   Morphine And Related Itching   Penicillins Itching   Sulfa Antibiotics Itching    Current Problems (verified) Patient Active Problem List   Diagnosis Date Noted   CAD in native artery 12/03/2020   Carotid stenosis 10/25/2020   Insulin resistance 05/12/2020   Former smoker, 30 pack year, quit 2015 01/09/2020   Aortic  atherosclerosis ([place]) by Chest CT scan  May 2021 01/08/2020   Hidradenitis suppurativa 05/16/2019   Morbid obesity ([place]) 11/08/2017   Abnormal glucose 08/02/2017   Cervical spondylosis with radiculopathy 05/30/2017   Rheumatoid arthritis involving multiple  sites with positive rheumatoid factor ([place]) 11/14/2015   Sjogren's disease ([place]) 07/05/2014   Medication management 03/26/2014   Hyperlipidemia, mixed 09/24/2013   Vitamin D deficiency 09/24/2013   Intrinsic asthma    Essential hypertension    Hypothyroidism    Degeneration of intervertebral disc of lumbosacral region 05/23/2008    Screening Tests Immunization History  Administered Date(s) Administered   DTaP 09/15/2012, 09/15/2012   Influenza Inj Mdck Quad With Preservative 08/02/2017   Influenza Whole 09/06/2012   Influenza-Unspecified 05/07/2014, 05/20/2016, 08/02/2017   Pneumococcal Conjugate-13 07/05/2014   Pneumococcal Polysaccharide-23 03/02/2021   Zoster, Live 09/03/2013    Colonoscopy: 2013 - 10 year follow up MGM: 07/20/21 Stress: 2004 DEXA: 2013 Echo: 2004 CXr: 02/2016 PAP: reports had in 01/2019 by GYN  Former smoker, 30 pack year hx, quit 2015 Ct chest: 07/15/21  Tdap: 2014 Flu vaccine: 2018 Covid 19: declines   All other vaccines UTD  Names of Other Physician/Practitioners you currently use: 1. [facility] here for primary care 2. [place], eye doctor, last visit 2020 - mild cataracts 3. [facility], dentist, last visit 2020, goes q70m, due    Patient Care Team: Unk Pinto, MD as PCP - General (Internal Medicine) Ladene Artist, MD as Consulting Physician (Gastroenterology) Newt Minion, MD as Consulting Physician (Orthopedic Surgery) Unice Bailey, MD as Consulting Physician (Rheumatology) Karie Chimera, MD as Consulting Physician (Neurosurgery) Druscilla Brownie, MD as Consulting Physician (Dermatology) Druscilla Brownie, MD as  Consulting Physician (Dermatology)  SURGICAL HISTORY She  has a past surgical history that includes Thyroidectomy; Upper gastrointestinal endoscopy; Total knee arthroplasty; Vaginal hysterectomy; Knee surgery; Pilonidal cyst excision; Tonsillectomy; Anterior lat lumbar fusion (Left, 01/26/2013); Lumbar percutaneous pedicle screw 1 level (Left, 01/26/2013); Knee Arthroplasty (Left, 02/16/2016); and Anterior cervical decomp/discectomy fusion (N/A, 05/30/2017).   FAMILY HISTORY Her family history includes Alzheimer's disease in her paternal grandfather; Asthma in her brother; Breast cancer (age of onset: 72) in her maternal grandmother; Diabetes in her brother; Heart attack in her paternal grandfather; Heart attack (age of onset: 57) in her father; Hypertension in her father and maternal grandmother; Lung cancer (age of onset: 70) in her father; Other in her daughter; Stroke in her paternal grandmother; Stroke (age of onset: 76) in her father.   SOCIAL HISTORY She  reports that she quit smoking about 6 years ago. Her smoking use included cigarettes. She has a 30.00 pack-year smoking history. She has never used smokeless tobacco. She reports current alcohol use. She reports that she does not use drugs.   Review of Systems  Constitutional:  Positive for malaise/fatigue. Negative for weight loss.  HENT:  Positive for congestion. Negative for hearing loss and tinnitus.   Eyes:  Negative for blurred vision and double vision.  Respiratory:  Negative for cough, sputum production, shortness of breath and wheezing.   Cardiovascular:  Negative for chest pain, palpitations, orthopnea, claudication, leg swelling and PND.  Gastrointestinal:  Negative for abdominal pain, blood in stool, constipation, diarrhea, heartburn, melena, nausea and vomiting.  Genitourinary: Negative.   Musculoskeletal:  Positive for joint pain (wrists, follows rheumatology). Negative for falls and myalgias.  Skin:  Negative for rash.        Hidradenitis areas on inner thighs and axilla  Neurological:  Negative for dizziness, tingling, sensory change, weakness and headaches.  Endo/Heme/Allergies:  Negative for polydipsia.  Psychiatric/Behavioral: Negative.  Negative for depression, memory loss, substance abuse and suicidal ideas. The patient is not nervous/anxious and does not have  insomnia.   All other systems reviewed and are negative.   Objective:     Today's Vitals   09/14/21 1040  BP: (!) 144/80  Pulse: 94  Temp: (!) 97.5 F (36.4 C)  SpO2: 98%  Weight: 201 lb 3.2 oz (91.3 kg)   Body mass index is 36.8 kg/m.  General Appearance: Well nourished, in no apparent distress. Eyes: PERRLA, EOMs, conjunctiva no swelling or erythema Sinuses: No Frontal/maxillary tenderness ENT/Mouth: Ext aud canals clear, TMs without erythema, bulging. No erythema, swelling post pharynx. Clear post nasal drip noted Tonsils not swollen or erythematous. Hearing normal.  Neck: Supple, thyroid normal.  Respiratory: Respiratory effort normal, BS equal bilaterally without rales, rhonchi, wheezing or stridor.  Cardio: RRR with no MRGs. Brisk peripheral pulses without edema.  Abdomen: Soft, + BS.  Non tender, no guarding, rebound, hernias, masses. Lymphatics: Non tender without lymphadenopathy.  Musculoskeletal: Full ROM, 5/5 strength, Mildly antalgic gait. R wrist with some warmth; non-erythematous, no obvious deformity Skin: Warm, dry; intact . Hidradenitis lesion noted inner thigh area and left axilla Neuro: Cranial nerves intact. No cerebellar symptoms.  Psych: Awake and oriented X 3, normal affect, Insight and Judgment appropriate.     Magda Bernheim ANP-C  [facility]  09/14/2021

## 2021-09-11 ENCOUNTER — Ambulatory Visit: Payer: Medicare Other | Admitting: Nurse Practitioner

## 2021-09-11 DIAGNOSIS — M0609 Rheumatoid arthritis without rheumatoid factor, multiple sites: Secondary | ICD-10-CM | POA: Diagnosis not present

## 2021-09-14 ENCOUNTER — Other Ambulatory Visit: Payer: Self-pay

## 2021-09-14 ENCOUNTER — Encounter: Payer: Self-pay | Admitting: Nurse Practitioner

## 2021-09-14 ENCOUNTER — Ambulatory Visit (INDEPENDENT_AMBULATORY_CARE_PROVIDER_SITE_OTHER): Payer: Medicare Other | Admitting: Nurse Practitioner

## 2021-09-14 VITALS — BP 144/80 | HR 94 | Temp 97.5°F | Wt 201.2 lb

## 2021-09-14 DIAGNOSIS — R7309 Other abnormal glucose: Secondary | ICD-10-CM

## 2021-09-14 DIAGNOSIS — I700 Atherosclerosis of aorta: Secondary | ICD-10-CM

## 2021-09-14 DIAGNOSIS — M5137 Other intervertebral disc degeneration, lumbosacral region: Secondary | ICD-10-CM

## 2021-09-14 DIAGNOSIS — Z87891 Personal history of nicotine dependence: Secondary | ICD-10-CM

## 2021-09-14 DIAGNOSIS — E559 Vitamin D deficiency, unspecified: Secondary | ICD-10-CM

## 2021-09-14 DIAGNOSIS — M4722 Other spondylosis with radiculopathy, cervical region: Secondary | ICD-10-CM | POA: Diagnosis not present

## 2021-09-14 DIAGNOSIS — E039 Hypothyroidism, unspecified: Secondary | ICD-10-CM | POA: Diagnosis not present

## 2021-09-14 DIAGNOSIS — Z79899 Other long term (current) drug therapy: Secondary | ICD-10-CM

## 2021-09-14 DIAGNOSIS — I10 Essential (primary) hypertension: Secondary | ICD-10-CM

## 2021-09-14 DIAGNOSIS — L732 Hidradenitis suppurativa: Secondary | ICD-10-CM

## 2021-09-14 DIAGNOSIS — M0579 Rheumatoid arthritis with rheumatoid factor of multiple sites without organ or systems involvement: Secondary | ICD-10-CM | POA: Diagnosis not present

## 2021-09-14 DIAGNOSIS — J45909 Unspecified asthma, uncomplicated: Secondary | ICD-10-CM | POA: Diagnosis not present

## 2021-09-14 DIAGNOSIS — E782 Mixed hyperlipidemia: Secondary | ICD-10-CM | POA: Diagnosis not present

## 2021-09-14 DIAGNOSIS — M51379 Other intervertebral disc degeneration, lumbosacral region without mention of lumbar back pain or lower extremity pain: Secondary | ICD-10-CM

## 2021-09-14 DIAGNOSIS — J069 Acute upper respiratory infection, unspecified: Secondary | ICD-10-CM

## 2021-09-14 DIAGNOSIS — M3500 Sicca syndrome, unspecified: Secondary | ICD-10-CM

## 2021-09-14 MED ORDER — DOXYCYCLINE HYCLATE 50 MG PO CAPS: 50.0000 mg | ORAL_CAPSULE | Freq: Every day | ORAL | 3 refills | Status: DC

## 2021-09-14 MED ORDER — TRIAMCINOLONE ACETONIDE 55 MCG/ACT NA AERO: 1.0000 | INHALATION_SPRAY | Freq: Two times a day (BID) | NASAL | 2 refills | Status: DC

## 2021-09-15 LAB — CBC WITH DIFFERENTIAL/PLATELET
Absolute Monocytes: 421 cells/uL (ref 200–950)
Basophils Absolute: 31 cells/uL (ref 0–200)
Basophils Relative: 0.5 %
Eosinophils Absolute: 31 cells/uL (ref 15–500)
Eosinophils Relative: 0.5 %
HCT: 39.6 % (ref 35.0–45.0)
Hemoglobin: 13.1 g/dL (ref 11.7–15.5)
Lymphs Abs: 2965 cells/uL (ref 850–3900)
MCH: 29.5 pg (ref 27.0–33.0)
MCHC: 33.1 g/dL (ref 32.0–36.0)
MCV: 89.2 fL (ref 80.0–100.0)
MPV: 10.2 fL (ref 7.5–12.5)
Monocytes Relative: 6.9 %
Neutro Abs: 2654 cells/uL (ref 1500–7800)
Neutrophils Relative %: 43.5 %
Platelets: 312 10*3/uL (ref 140–400)
RBC: 4.44 10*6/uL (ref 3.80–5.10)
RDW: 12.8 % (ref 11.0–15.0)
Total Lymphocyte: 48.6 %
WBC: 6.1 10*3/uL (ref 3.8–10.8)

## 2021-09-15 LAB — COMPLETE METABOLIC PANEL WITH GFR
AG Ratio: 1.5 (calc) (ref 1.0–2.5)
ALT: 25 U/L (ref 6–29)
AST: 22 U/L (ref 10–35)
Albumin: 4.4 g/dL (ref 3.6–5.1)
Alkaline phosphatase (APISO): 54 U/L (ref 37–153)
BUN: 18 mg/dL (ref 7–25)
CO2: 30 mmol/L (ref 20–32)
Calcium: 10.3 mg/dL (ref 8.6–10.4)
Chloride: 103 mmol/L (ref 98–110)
Creat: 0.58 mg/dL (ref 0.50–1.05)
Globulin: 3 g/dL (calc) (ref 1.9–3.7)
Glucose, Bld: 85 mg/dL (ref 65–99)
Potassium: 4.4 mmol/L (ref 3.5–5.3)
Sodium: 140 mmol/L (ref 135–146)
Total Bilirubin: 0.3 mg/dL (ref 0.2–1.2)
Total Protein: 7.4 g/dL (ref 6.1–8.1)
eGFR: 104 mL/min/{1.73_m2} (ref >=60)

## 2021-09-15 LAB — LIPID PANEL
Cholesterol: 193 mg/dL (ref <200)
HDL: 80 mg/dL (ref >=50)
LDL Cholesterol (Calc): 95 mg/dL (calc)
Non-HDL Cholesterol (Calc): 113 mg/dL (calc) (ref <130)
Total CHOL/HDL Ratio: 2.4 (calc) (ref <5.0)
Triglycerides: 90 mg/dL (ref <150)

## 2021-09-15 LAB — TSH: TSH: 0.07 mIU/L — ABNORMAL LOW (ref 0.40–4.50)

## 2021-09-27 ENCOUNTER — Encounter: Payer: Self-pay | Admitting: Cardiovascular Disease

## 2021-09-27 NOTE — Progress Notes (Signed)
Cardiology Office Note:    Date:  09/28/2021   ID:  MAGEN SURIANO, DOB 1960-10-02, MRN [id number]  PCP:  Unk Pinto, MD   [facility] Providers Cardiologist:  None     Referring MD: Unk Pinto, MD   Chief Complaint  Patient presents with   Coronary Artery Disease        Hyperlipidemia    Jan 14, 2021:    Autumn Frye is a 61 y.o. female with a hx of CAD, autoimmune disease.(RA )  We were asked to see her by Adline Mango, NP for further evaluation of her CAD She was found to have incidental coronary artery calcifications by CT scan  Coronary calcium score ( [facility] )  The total coronary calcium score is 139 which is between the 90th - 100th percentile for a female patient this age.  Father and paternal grandmother have CAD  Does exercise daily . Had some CP several months ago  Episode was a hot sensation of both arms, associ with sweats Lasted 10  Min.  Occurred at rest,  Not associated with exertion   Takes the lasix once a week - not TID as listed on the [facility]   Started Olmesartan several months ago .  Watches her salt intake   Jan. 23, 2023 Autumn Frye is seen today for follow up of her CAD, RA I saw her in May for episodes of CP Myoview on Jan 20, 2022 showed no ischemia, no infarction , hyperdynamic LV function . Former smoker  LDL was  95 ,  is now on Repatha every 2 weeks  Has some vague CP with exertion .     Past Medical History:  Diagnosis Date   Arthritis    OA, RA   Asthma    with a cold   Autoimmune deficiency syndrome ([place])    Back pain    Cancer ([place])    thyroid-1995   Family history of adverse reaction to anesthesia    mother problems with n/v   Fibromyalgia    GERD (gastroesophageal reflux disease)    Hepatitis    'A" when pt. was 12 yrs. old   Hiatal hernia    Hyperlipidemia    Hypertension    Hypothyroidism    Pneumonia    S/P total knee arthroplasty 02/16/2016    Past Surgical History:  Procedure  Laterality Date   ANTERIOR CERVICAL DECOMP/DISCECTOMY FUSION N/A 05/30/2017   Procedure: Cervical five-six, Cervical six-seven Anterior discectomy with fusion and plate fixation;  Surgeon: Ditty, Kevan Ny, MD;  Location: [place];  Service: Neurosurgery;  Laterality: N/A;   ANTERIOR LAT LUMBAR FUSION Left 01/26/2013   Procedure: ANTERIOR LATERAL LUMBAR FUSION 1 LEVEL;  Surgeon: Faythe Ghee, MD;  Location: [place] [facility];  Service: Neurosurgery;  Laterality: Left;  lumbar four-five   KNEE ARTHROPLASTY Left 02/16/2016   Procedure: COMPUTER ASSISTED TOTAL KNEE ARTHROPLASTY;  Surgeon: Dereck Leep, MD;  Location: [facility] ORS;  Service: Orthopedics;  Laterality: Left;   KNEE SURGERY     3 on right knee, 1 on left-prior to TKR   LUMBAR PERCUTANEOUS PEDICLE SCREW 1 LEVEL Left 01/26/2013   Procedure: LUMBAR PERCUTANEOUS PEDICLE SCREW 1 LEVEL;  Surgeon: Faythe Ghee, MD;  Location: [place] [facility];  Service: Neurosurgery;  Laterality: Left;  lumbar four-five   PILONIDAL CYST EXCISION     THYROIDECTOMY     TONSILLECTOMY     TOTAL KNEE ARTHROPLASTY     right  UPPER GASTROINTESTINAL ENDOSCOPY     VAGINAL HYSTERECTOMY      Current Medications: Current Meds  Medication Sig   acetaminophen (TYLENOL) 500 MG tablet Take 1 tablet (500 mg total) by mouth every 6 (six) hours as needed.   Acetylcysteine (N-ACETYL-L-CYSTEINE PO) Take 1 capsule by mouth daily.   albuterol (PROVENTIL) (2.5 MG/3ML) 0.083% nebulizer solution Take 3 mLs (2.5 mg total) by nebulization every 6 (six) hours as needed for wheezing or shortness of breath.   albuterol (VENTOLIN HFA) 108 (90 Base) MCG/ACT inhaler INHALE 2 PUFFS INTO THE LUNGS EVERY 4 HOURS 15 MINUTES APART AS NEEDED FOR ASTHMA   amitriptyline (ELAVIL) 25 MG tablet Take 1 tablet every Morning & 3 tablets at Bedtime for Chronic Pain   ASPIRIN 81 PO Take by mouth daily.   Black Pepper-Turmeric (TURMERIC CURCUMIN) 01-999 MG CAPS Takes 1 capsule  Daily   Cholecalciferol  (VITAMIN D3) 5000 UNITS TABS Take 10,000 Units by mouth daily.    Cinnamon 500 MG capsule Take 500 mg by mouth daily.   CRANBERRY PO Take 1 tablet by mouth 2 (two) times daily.   diclofenac Sodium (VOLTAREN) 1 % GEL diclofenac 1 % topical gel  APP 2 GRAMS TOPICALLY AA TWO TO TID   doxycycline (VIBRAMYCIN) 50 MG capsule Take 1 capsule (50 mg total) by mouth daily.   Evolocumab (REPATHA SURECLICK) 935 MG/ML SOAJ Inject 1 pen into the skin every 14 (fourteen) days.   Eyelid Cleansers (AVENOVA) 0.01 % SOLN Place 1 application into both eyes 2 (two) times daily. Apply to eyelids BID   FEMRING 0.1 MG/24HR RING INSERT 1 RING VAGINALLY EVERY 3 MONTHS   fexofenadine (ALLEGRA) 180 MG tablet Take 180 mg by mouth daily.   fluticasone furoate-vilanterol (BREO ELLIPTA) 200-25 MCG/INH AEPB Use  1 Inhalation  Daily for Chronic Asthma   furosemide (LASIX) 40 MG tablet Take 40 mg by mouth once a week.   inFLIXimab-axxq (AVSOLA IV) Inject into the vein.   L-GLUTAMINE PO Take 1 capsule by mouth daily.   l-methylfolate-B6-B12 (METANX) 3-35-2 MG TABS tablet Take 1 tablet by mouth daily.   Levomefolate Glucosamine (METHYLFOLATE PO) Take 5,000 mg by mouth daily.   levothyroxine (SYNTHROID) 137 MCG tablet Take 137 mcg by mouth daily before breakfast. Take 1 tablet by mouth daily except Wednesdays takes 1/2 tablet   LOTEMAX 0.5 % GEL INSTILL 1 DROP IN BOTH EYES THREE TIMES DAILY   Magnesium Oxide 500 MG TABS Take 1,000 mg by mouth daily.   MILK THISTLE EXTRACT PO Take 2 capsules by mouth daily.   montelukast (SINGULAIR) 10 MG tablet TAKE 1 TABLET BY MOUTH DAILY FOR ALLERGIES   mupirocin ointment (BACTROBAN) 2 % APPLY EXTERNALLY TO THE AFFECTED AREA THREE TIMES DAILY FOR 10 DAYS   olmesartan (BENICAR) 20 MG tablet Take  1 tablet  Daily  for BP   Omega-3 Fatty Acids (FISH OIL) 1200 MG CAPS Take 2 capsules by mouth daily.    Polyethyl Glycol-Propyl Glycol 0.4-0.3 % SOLN Place 1 drop into both eyes 4 (four) times daily.     RESTASIS 0.05 % ophthalmic emulsion Place 1 drop into both eyes daily.    rosuvastatin (CRESTOR) 40 MG tablet TAKE 1 TABLET BY MOUTH DAILY FOR CHOLESTEROL   triamcinolone (NASACORT) 55 MCG/ACT AERO nasal inhaler Place 1 spray into the nose 2 (two) times daily.   vitamin B-12 (CYANOCOBALAMIN) 1000 MCG tablet 1 tablet     Allergies:   Ciprofloxacin, Levaquin [levofloxacin], Lyrica [pregabalin], Compazine [prochlorperazine  edisylate], Gabapentin, Septra [sulfamethoxazole-trimethoprim], Zetia [ezetimibe], Hydrocodone, Morphine and related, Penicillins, and Sulfa antibiotics   Social History   Socioeconomic History   Marital status: Married    Spouse name: Not on file   Number of children: Not on file   Years of education: Not on file   Highest education level: Not on file  Occupational History   Not on file  Tobacco Use   Smoking status: Former    Packs/day: 0.75    Years: 40.00    Pack years: 30.00    Types: Cigarettes    Quit date: 07/05/2015    Years since quitting: 6.2   Smokeless tobacco: Never  Substance and Sexual Activity   Alcohol use: Yes    Alcohol/week: 0.0 standard drinks    Comment: rare   Drug use: No   Sexual activity: Not on file  Other Topics Concern   Not on file  Social History Narrative   Not on file   Social Determinants of Health   Financial Resource Strain: Not on file  Food Insecurity: Not on file  Transportation Needs: Not on file  Physical Activity: Not on file  Stress: Not on file  Social Connections: Not on file     Family History: The patient's family history includes Alzheimer's disease in her paternal grandfather; Asthma in her brother; Breast cancer (age of onset: 23) in her maternal grandmother; Diabetes in her brother; Heart attack in her paternal grandfather; Heart attack (age of onset: 70) in her father; Hypertension in her father and maternal grandmother; Lung cancer (age of onset: 48) in her father; Other in her daughter; Stroke  in her paternal grandmother; Stroke (age of onset: 25) in her father. There is no history of Colon cancer, Esophageal cancer, Stomach cancer, or Rectal cancer.  ROS:   Please see the history of present illness.     All other systems reviewed and are negative.  EKGs/Labs/Other Studies Reviewed:    The following studies were reviewed today:   EKG:    Recent Labs: 06/08/2021: Magnesium 2.0 09/14/2021: ALT 25; BUN 18; Creat 0.58; Hemoglobin 13.1; Platelets 312; Potassium 4.4; Sodium 140; TSH 0.07  Recent Lipid Panel    Component Value Date/Time   CHOL 193 09/14/2021 1128   CHOL 179 04/08/2021 1236   TRIG 90 09/14/2021 1128   HDL 80 09/14/2021 1128   HDL 105 04/08/2021 1236   CHOLHDL 2.4 09/14/2021 1128   VLDL 19 01/28/2017 0930   LDLCALC 95 09/14/2021 1128     Risk Assessment/Calculations:       Physical Exam:    Physical Exam: Blood pressure 128/72, pulse 86, height 5\' 2"  (1.575 m), weight 201 lb 6.4 oz (91.4 kg), SpO2 97 %.  GEN:  Well nourished, well developed in no acute distress HEENT: Normal NECK: No JVD; No carotid bruits LYMPHATICS: No lymphadenopathy CARDIAC: RRR   RESPIRATORY:  Clear to auscultation without rales, wheezing or rhonchi  ABDOMEN: Soft, non-tender, non-distended MUSCULOSKELETAL:  No edema; No deformity  SKIN: Warm and dry NEUROLOGIC:  Alert and oriented x 3   ASSESSMENT:    No diagnosis found.  PLAN:       Possible unstable angina:   has atypical CP.   Not exertional  Myoview study was normal - images reviewed  Coronary calcifications: She has extensive coronary artery calcifications.  Fortunately this point she is not having any angina.  She is on an aspirin once a day.  She is on Repatha.  We will continue  with risk factor modification.  She will let us know if she has any episodes of angina.  3.  Hyperlipidemia:   Currently on Repatha.   Shared Decision Making/Informed Consent The risks [chest pain, shortness of breath, cardiac  arrhythmias, dizziness, blood pressure fluctuations, myocardial infarction, stroke/transient ischemic attack, nausea, vomiting, allergic reaction, radiation exposure, metallic taste sensation and life-threatening complications (estimated to be 1 in 10,000)], benefits (risk stratification, diagnosing coronary artery disease, treatment guidance) and alternatives of a nuclear stress test were discussed in detail with Ms. Schader and she agrees to proceed.    Medication Adjustments/Labs and Tests Ordered: Current medicines are reviewed at length with the patient today.  Concerns regarding medicines are outlined above.  No orders of the defined types were placed in this encounter.  No orders of the defined types were placed in this encounter.   Patient Instructions  Medication Instructions:  Your physician recommends that you continue on your current medications as directed. Please refer to the Current Medication list given to you today.  *If you need a refill on your cardiac medications before your next appointment, please call your pharmacy*   Lab Work: None ordered  If you have labs (blood work) drawn today and your tests are completely normal, you will receive your results only by: [place] (if you have MyChart) OR A paper copy in the mail If you have any lab test that is abnormal or we need to change your treatment, we will call you to review the results.   Testing/Procedures: None ordered   Follow-Up: At [facility], you and your health needs are our priority.  As part of our continuing mission to provide you with exceptional heart care, we have created designated Provider Care Teams.  These Care Teams include your primary Cardiologist (physician) and Advanced Practice Providers (APPs -  Physician Assistants and Nurse Practitioners) who all work together to provide you with the care you need, when you need it.  We recommend signing up for the patient portal called  "MyChart".  Sign up information is provided on this After Visit Summary.  MyChart is used to connect with patients for Virtual Visits (Telemedicine).  Patients are able to view lab/test results, encounter notes, upcoming appointments, etc.  Non-urgent messages can be sent to your provider as well.   To learn more about what you can do with MyChart, go to [email or web address].    Your next appointment:   6 month(s)  The format for your next appointment:   In Person  Provider:    Robbie Lis, PA-C, Christen Bame, NP, or Richardson Dopp, PA-C        Other Instructions    Signed, Mertie Moores, MD  09/28/2021 5:44 PM    [place]

## 2021-09-28 ENCOUNTER — Other Ambulatory Visit: Payer: Self-pay

## 2021-09-28 ENCOUNTER — Ambulatory Visit: Payer: Medicare Other | Admitting: Cardiovascular Disease

## 2021-09-28 ENCOUNTER — Encounter: Payer: Self-pay | Admitting: Cardiovascular Disease

## 2021-09-28 VITALS — BP 128/72 | HR 86 | Ht 62.0 in | Wt 201.4 lb

## 2021-09-28 DIAGNOSIS — I2510 Atherosclerotic heart disease of native coronary artery without angina pectoris: Secondary | ICD-10-CM | POA: Diagnosis not present

## 2021-09-28 NOTE — Patient Instructions (Addendum)
Medication Instructions:  Your physician recommends that you continue on your current medications as directed. Please refer to the Current Medication list given to you today.  *If you need a refill on your cardiac medications before your next appointment, please call your pharmacy*   Lab Work: None ordered  If you have labs (blood work) drawn today and your tests are completely normal, you will receive your results only by: [place] (if you have MyChart) OR A paper copy in the mail If you have any lab test that is abnormal or we need to change your treatment, we will call you to review the results.   Testing/Procedures: None ordered   Follow-Up: At [facility], you and your health needs are our priority.  As part of our continuing mission to provide you with exceptional heart care, we have created designated Provider Care Teams.  These Care Teams include your primary Cardiologist (physician) and Advanced Practice Providers (APPs -  Physician Assistants and Nurse Practitioners) who all work together to provide you with the care you need, when you need it.  We recommend signing up for the patient portal called "MyChart".  Sign up information is provided on this After Visit Summary.  MyChart is used to connect with patients for Virtual Visits (Telemedicine).  Patients are able to view lab/test results, encounter notes, upcoming appointments, etc.  Non-urgent messages can be sent to your provider as well.   To learn more about what you can do with MyChart, go to [email or web address].    Your next appointment:   6 month(s)  The format for your next appointment:   In Person  Provider:    Robbie Lis, PA-C, Christen Bame, NP, or Richardson Dopp, PA-C        Other Instructions

## 2021-09-30 DIAGNOSIS — H5203 Hypermetropia, bilateral: Secondary | ICD-10-CM | POA: Diagnosis not present

## 2021-09-30 DIAGNOSIS — H524 Presbyopia: Secondary | ICD-10-CM | POA: Diagnosis not present

## 2021-09-30 DIAGNOSIS — H52222 Regular astigmatism, left eye: Secondary | ICD-10-CM | POA: Diagnosis not present

## 2021-09-30 DIAGNOSIS — E119 Type 2 diabetes mellitus without complications: Secondary | ICD-10-CM | POA: Diagnosis not present

## 2021-09-30 DIAGNOSIS — M3501 Sicca syndrome with keratoconjunctivitis: Secondary | ICD-10-CM | POA: Diagnosis not present

## 2021-09-30 DIAGNOSIS — H2513 Age-related nuclear cataract, bilateral: Secondary | ICD-10-CM | POA: Diagnosis not present

## 2021-09-30 NOTE — Progress Notes (Signed)
Assessment and Plan:  Autumn Frye was seen today for acute visit.  Diagnoses and all orders for this visit:  Blister of toe of right foot with infection, initial encounter -     doxycycline (VIBRAMYCIN) 100 MG capsule; Take 1 capsule (100 mg total) by mouth 2 (two) times daily. Nail is cut back, encouraged pt to keep nails shorter If redness/ pain is not improved with antibiotic notify the office       Further disposition pending results of labs. Discussed med's effects and SE's.   Over 30 minutes of exam, counseling, chart review, and critical decision making was performed.   Future Appointments  Date Time Provider [place]  03/02/2022  4:00 PM Liane Comber, NP [facility] None  06/08/2022 11:00 AM Unk Pinto, MD [facility] None    ------------------------------------------------------------------------------------------------------------------   HPI BP 138/88    Pulse 90    Temp 97.9 F (36.6 C)    Resp 17    Ht 5\' 2"  (1.575 m)    Wt 200 lb 6.4 oz (90.9 kg)    LMP  (LMP Unknown)    SpO2 96%    BMI 36.65 kg/m  61 y.o.female presents for pain in right foot pinky toe which she describes as sore on the entire toe.  Walking makes the pain worse.   Past Medical History:  Diagnosis Date   Arthritis    OA, RA   Asthma    with a cold   Autoimmune deficiency syndrome ([place])    Back pain    Cancer ([place])    thyroid-1995   Family history of adverse reaction to anesthesia    mother problems with n/v   Fibromyalgia    GERD (gastroesophageal reflux disease)    Hepatitis    'A" when pt. was 12 yrs. old   Hiatal hernia    Hyperlipidemia    Hypertension    Hypothyroidism    Pneumonia    S/P total knee arthroplasty 02/16/2016     Allergies  Allergen Reactions   Ciprofloxacin Hives and Swelling    SWELLING REACTION UNSPECIFIED    Levaquin [Levofloxacin] Hives   Lyrica [Pregabalin] Other (See Comments)    Altered mental status ALTERED MENTAL STATUS HALLUCINATIONS    Compazine [Prochlorperazine Edisylate]     UNSPECIFIED REACTION    Gabapentin     Diarrhea and nausea   Septra [Sulfamethoxazole-Trimethoprim]     itch   Zetia [Ezetimibe]     Stomach cramping   Hydrocodone Itching   Morphine And Related Itching   Penicillins Itching   Sulfa Antibiotics Itching    Current Outpatient Medications on File Prior to Visit  Medication Sig   acetaminophen (TYLENOL) 500 MG tablet Take 1 tablet (500 mg total) by mouth every 6 (six) hours as needed.   Acetylcysteine (N-ACETYL-L-CYSTEINE PO) Take 1 capsule by mouth daily.   albuterol (PROVENTIL) (2.5 MG/3ML) 0.083% nebulizer solution Take 3 mLs (2.5 mg total) by nebulization every 6 (six) hours as needed for wheezing or shortness of breath.   albuterol (VENTOLIN HFA) 108 (90 Base) MCG/ACT inhaler INHALE 2 PUFFS INTO THE LUNGS EVERY 4 HOURS 15 MINUTES APART AS NEEDED FOR ASTHMA   amitriptyline (ELAVIL) 25 MG tablet Take 1 tablet every Morning & 3 tablets at Bedtime for Chronic Pain   ASPIRIN 81 PO Take by mouth daily.   Black Pepper-Turmeric (TURMERIC CURCUMIN) 01-999 MG CAPS Takes 1 capsule  Daily   Cholecalciferol (VITAMIN D3) 5000 UNITS TABS Take 10,000 Units by mouth daily.  Cinnamon 500 MG capsule Take 500 mg by mouth daily.   CRANBERRY PO Take 1 tablet by mouth 2 (two) times daily.   diclofenac Sodium (VOLTAREN) 1 % GEL diclofenac 1 % topical gel  APP 2 GRAMS TOPICALLY AA TWO TO TID   doxycycline (VIBRAMYCIN) 50 MG capsule Take 1 capsule (50 mg total) by mouth daily.   Evolocumab (REPATHA SURECLICK) 322 MG/ML SOAJ Inject 1 pen into the skin every 14 (fourteen) days.   Eyelid Cleansers (AVENOVA) 0.01 % SOLN Place 1 application into both eyes 2 (two) times daily. Apply to eyelids BID   FEMRING 0.1 MG/24HR RING INSERT 1 RING VAGINALLY EVERY 3 MONTHS   fexofenadine (ALLEGRA) 180 MG tablet Take 180 mg by mouth daily.   fluticasone furoate-vilanterol (BREO ELLIPTA) 200-25 MCG/INH AEPB Use  1 Inhalation   Daily for Chronic Asthma   furosemide (LASIX) 40 MG tablet Take 40 mg by mouth once a week.   inFLIXimab-axxq (AVSOLA IV) Inject into the vein.   L-GLUTAMINE PO Take 1 capsule by mouth daily.   l-methylfolate-B6-B12 (METANX) 3-35-2 MG TABS tablet Take 1 tablet by mouth daily.   Levomefolate Glucosamine (METHYLFOLATE PO) Take 5,000 mg by mouth daily.   levothyroxine (SYNTHROID) 137 MCG tablet Take 137 mcg by mouth daily before breakfast. Take 1 tablet by mouth daily except Wednesdays takes 1/2 tablet   LOTEMAX 0.5 % GEL INSTILL 1 DROP IN BOTH EYES THREE TIMES DAILY   Magnesium Oxide 500 MG TABS Take 1,000 mg by mouth daily.   MILK THISTLE EXTRACT PO Take 2 capsules by mouth daily.   montelukast (SINGULAIR) 10 MG tablet TAKE 1 TABLET BY MOUTH DAILY FOR ALLERGIES   mupirocin ointment (BACTROBAN) 2 % APPLY EXTERNALLY TO THE AFFECTED AREA THREE TIMES DAILY FOR 10 DAYS   olmesartan (BENICAR) 20 MG tablet Take  1 tablet  Daily  for BP   Omega-3 Fatty Acids (FISH OIL) 1200 MG CAPS Take 2 capsules by mouth daily.    Polyethyl Glycol-Propyl Glycol 0.4-0.3 % SOLN Place 1 drop into both eyes 4 (four) times daily.    RESTASIS 0.05 % ophthalmic emulsion Place 1 drop into both eyes daily.    rosuvastatin (CRESTOR) 40 MG tablet TAKE 1 TABLET BY MOUTH DAILY FOR CHOLESTEROL   triamcinolone (NASACORT) 55 MCG/ACT AERO nasal inhaler Place 1 spray into the nose 2 (two) times daily.   vitamin B-12 (CYANOCOBALAMIN) 1000 MCG tablet 1 tablet   SYNTHROID 150 MCG tablet Take 1 tab daily except 1/2 tab on Sunday. Take on empty stomach with water only; no other food/drink/pills for 30-60 min. (Patient not taking: Reported on 10/01/2021)   No current facility-administered medications on file prior to visit.    ROS: all negative except above.   Physical Exam:  BP 138/88    Pulse 90    Temp 97.9 F (36.6 C)    Resp 17    Ht 5\' 2"  (1.575 m)    Wt 200 lb 6.4 oz (90.9 kg)    LMP  (LMP Unknown)    SpO2 96%    BMI 36.65 kg/m    General Appearance: Well nourished, in no apparent distress. Eyes: PERRLA, EOMs, conjunctiva no swelling or erythema Sinuses: No Frontal/maxillary tenderness ENT/Mouth: Ext aud canals clear, TMs without erythema, bulging. No erythema, swelling, or exudate on post pharynx.  Tonsils not swollen or erythematous. Hearing normal.  Neck: Supple, thyroid normal.  Respiratory: Respiratory effort normal, BS equal bilaterally without rales, rhonchi, wheezing or stridor.  Cardio:  RRR with no MRGs. Brisk peripheral pulses without edema.  Abdomen: Soft, + BS.  Non tender, no guarding, rebound, hernias, masses. Lymphatics: Non tender without lymphadenopathy.  Musculoskeletal: Full ROM, 5/5 strength, normal gait.  Skin: Warm, dry . Right pinky toe has thickened fungal toenail- clipped and has reddened blister under the nail Neuro: Cranial nerves intact. Normal muscle tone, no cerebellar symptoms. Sensation intact.  Psych: Awake and oriented X 3, normal affect, Insight and Judgment appropriate.     Magda Bernheim, NP 9:41 AM [facility]

## 2021-10-01 ENCOUNTER — Ambulatory Visit (INDEPENDENT_AMBULATORY_CARE_PROVIDER_SITE_OTHER): Payer: Medicare Other | Admitting: Nurse Practitioner

## 2021-10-01 ENCOUNTER — Encounter: Payer: Self-pay | Admitting: Nurse Practitioner

## 2021-10-01 ENCOUNTER — Other Ambulatory Visit: Payer: Self-pay

## 2021-10-01 VITALS — BP 138/88 | HR 90 | Temp 97.9°F | Resp 17 | Ht 62.0 in | Wt 200.4 lb

## 2021-10-01 DIAGNOSIS — L089 Local infection of the skin and subcutaneous tissue, unspecified: Secondary | ICD-10-CM | POA: Diagnosis not present

## 2021-10-01 MED ORDER — DOXYCYCLINE HYCLATE 100 MG PO CAPS: 100.0000 mg | ORAL_CAPSULE | Freq: Two times a day (BID) | ORAL | 0 refills | Status: DC

## 2021-10-05 ENCOUNTER — Other Ambulatory Visit: Payer: Self-pay

## 2021-10-05 MED ORDER — AMITRIPTYLINE HCL 25 MG PO TABS: ORAL_TABLET | ORAL | 3 refills | Status: DC

## 2021-10-14 DIAGNOSIS — M549 Dorsalgia, unspecified: Secondary | ICD-10-CM | POA: Diagnosis not present

## 2021-10-14 DIAGNOSIS — M797 Fibromyalgia: Secondary | ICD-10-CM | POA: Diagnosis not present

## 2021-10-14 DIAGNOSIS — E039 Hypothyroidism, unspecified: Secondary | ICD-10-CM | POA: Diagnosis not present

## 2021-10-14 DIAGNOSIS — M7062 Trochanteric bursitis, left hip: Secondary | ICD-10-CM | POA: Diagnosis not present

## 2021-10-14 DIAGNOSIS — M1990 Unspecified osteoarthritis, unspecified site: Secondary | ICD-10-CM | POA: Diagnosis not present

## 2021-10-14 DIAGNOSIS — H04123 Dry eye syndrome of bilateral lacrimal glands: Secondary | ICD-10-CM | POA: Diagnosis not present

## 2021-10-14 DIAGNOSIS — Z79899 Other long term (current) drug therapy: Secondary | ICD-10-CM | POA: Diagnosis not present

## 2021-10-14 DIAGNOSIS — M25511 Pain in right shoulder: Secondary | ICD-10-CM | POA: Diagnosis not present

## 2021-10-14 DIAGNOSIS — M0609 Rheumatoid arthritis without rheumatoid factor, multiple sites: Secondary | ICD-10-CM | POA: Diagnosis not present

## 2021-10-23 ENCOUNTER — Other Ambulatory Visit: Payer: Self-pay | Admitting: Adult Health

## 2021-10-23 ENCOUNTER — Other Ambulatory Visit: Payer: Self-pay | Admitting: Internal Medicine

## 2021-10-23 DIAGNOSIS — E782 Mixed hyperlipidemia: Secondary | ICD-10-CM

## 2021-10-26 DIAGNOSIS — M0609 Rheumatoid arthritis without rheumatoid factor, multiple sites: Secondary | ICD-10-CM | POA: Diagnosis not present

## 2021-10-29 ENCOUNTER — Telehealth: Payer: Self-pay | Admitting: Internal Medicine

## 2021-10-29 NOTE — Chronic Care Management (AMB) (Signed)
°  Chronic Care Management   Outreach Note  10/29/2021 Name: BONNIEJEAN PIANO MRN: [id number] DOB: [birth year]  Referred by: Unk Pinto, MD Reason for referral : No chief complaint on file.   An unsuccessful telephone outreach was attempted today. The patient was referred to the pharmacist for assistance with care management and care coordination.   Follow Up Plan:   Tatjana Dellinger Upstream Scheduler

## 2021-10-29 NOTE — Progress Notes (Signed)
°  Chronic Care Management   Note  10/29/2021 Name: Autumn Frye MRN: [id number] DOB: [birth year]  Autumn Frye is a 61 y.o. year old female who is a primary care patient of Unk Pinto, MD. I reached out to [name] by phone today in response to a referral sent by [name]'s PCP, Unk Pinto, MD.   Autumn Frye was given information about Chronic Care Management services today including:  CCM service includes personalized support from designated clinical staff supervised by her physician, including individualized plan of care and coordination with other care providers 24/7 contact phone numbers for assistance for urgent and routine care needs. Service will only be billed when office clinical staff spend 20 minutes or more in a month to coordinate care. Only one practitioner may furnish and bill the service in a calendar month. The patient may stop CCM services at any time (effective at the end of the month) by phone call to the office staff.   Patient agreed to services and verbal consent obtained.   Follow up plan: INITIAL OUTREACH SCHEDULED FOR 11/04/2021@3PM  TELEVISIT   Psychologist, prison and probation services

## 2021-11-02 ENCOUNTER — Telehealth: Payer: Self-pay

## 2021-11-02 NOTE — Telephone Encounter (Signed)
LM-11/02/21-Called patient to go over pre-call questions.Unable to reach pt. Will try calling at another time.   Total time spent: 3 Minutes

## 2021-11-03 ENCOUNTER — Encounter: Payer: Self-pay | Admitting: Gastroenterology

## 2021-11-03 NOTE — Telephone Encounter (Signed)
Called patient and confirmed upcoming visit w/ cpp and went over pre-call questions.   Total time spent: [place], [facility]

## 2021-11-04 ENCOUNTER — Other Ambulatory Visit: Payer: Self-pay

## 2021-11-04 ENCOUNTER — Ambulatory Visit: Payer: Medicare Other | Admitting: Pharmacist

## 2021-11-04 DIAGNOSIS — E8881 Metabolic syndrome: Secondary | ICD-10-CM

## 2021-11-04 DIAGNOSIS — E890 Postprocedural hypothyroidism: Secondary | ICD-10-CM

## 2021-11-04 DIAGNOSIS — R7309 Other abnormal glucose: Secondary | ICD-10-CM

## 2021-11-04 DIAGNOSIS — I10 Essential (primary) hypertension: Secondary | ICD-10-CM

## 2021-11-04 DIAGNOSIS — E782 Mixed hyperlipidemia: Secondary | ICD-10-CM

## 2021-11-04 DIAGNOSIS — Z79899 Other long term (current) drug therapy: Secondary | ICD-10-CM

## 2021-11-05 NOTE — Progress Notes (Signed)
Pharmacist Visit Anitta, Tenny [id number] 61 years, Female  DOB: [birth year]  M: [phone] Care Team: Otto Herb  __________________________________________________ Clinical Summary Patient Risk: Moderate Next CCM Follow Up: 03/23/22 @3pm  (office visit) Next AWV: 03/02/22 Summary for PCP: Lawanna Cecere is a friendly 61 year old female presenting for CCM initial visit via phone. She had no chief complaints at this time. Pt is very organized with her medications. Discussed non-pharm recommendations with patient on controlling blood pressure and cholesterol. Discussed thyroid medication administration with patient. Patient has Colonoscopy scheduled for December 02, 2021 Attestation Statement:: CCM Services:  This encounter meets complex CCM services and moderate to high medical decision making.  Prior to outreach and patient consent for Chronic Care Management, I referred this patient for services after reviewing the nominated patient list or from a personal encounter with the patient.  I have personally reviewed this encounter including the documentation in this note and have collaborated with the care management provider regarding care management and care coordination activities to include development and update of the comprehensive care plan I am certifying that I agree with the content of this note and encounter as supervising physician.   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 kids live in town. Pt has 2 sons and a daughter and an 27 year old granddaughter who lives with her Lifestyle habits such as diet and exercise?: Diet: fish and shrimp, protein shake for breakfast with 1 g of sugar and 30 g of protein. For supper, pt has shrimp, salmon and salad. Pt will have a fair share of junk sometimes Exercise: 2 times a week, pt would like to go more often Alcohol, tobacco, and illicit drug usage?: Alcohol: once a week couple of drinks Tobacco: none Illicit Drug Use: none What is the patient's sleep pattern?: No sleep issues How many hours per night does patient typically sleep?: 8 hours Patient pleased with health care they are receiving?: Yes Family, occupational, and living circumstances relevant to overall health?: Her family history includes Alzheimer's disease in her paternal grandfather; Asthma in her brother; Breast cancer (age of onset: 72) in her maternal grandmother; Diabetes in her brother; Heart attack in her paternal grandfather; Heart attack (age of onset: 34) in her father; Hypertension in her father and maternal grandmother; Lung cancer (age of onset: 59) in her father; Other in her daughter; Stroke in her paternal grandmother; Stroke (age of onset: 104) in her father Name and location of Current pharmacy: [facility] [place], [address] AT [address] Current Rx insurance plan: UHC Would patient benefit from direct intervention of clinical lead in dispensing process to optimize clinical outcomes?: Yes Are [facility] pharmacy services available where patient lives?: Yes Is patient disadvantaged to use [facility]?: No [facility] services reviewed with patient and  patient  wishes to change pharmacy?: No Select reason patient declined to change pharmacies: Patient preference Does patient experience delays in picking up medications due to transportation concerns (getting to pharmacy)?: No  Hypertension (HTN) Assess this condition today?: Yes Is patient able to obtain BP reading today?: No Goal: <130/80 mmHG Hypertension Stage: Stage 1 (SBP: 130-139 or DBP: 80-89) Is Patient checking BP at home?: No How often does patient miss taking their blood pressure medications?: Never Has patient experienced hypotension, dizziness, falls or bradycardia?: No Check present secondary causes (below) for HTN: Obesity We discussed: Reducing the amount of salt intake to 1500mg /per day., Targeting 150 minutes of aerobic activity per week, DASH diet:  following a diet emphasizing fruits and vegetables and low-fat dairy products along with whole grains, fish, poultry, and nuts. Reducing red meats and sugars., Other (provide details below) Details: Encouraged patient to check home BP frequently Assessment:: Controlled Drug: olmesartan (BENICAR) 20 MG tablet TAKE 1 TABLET BY MOUTH DAILY FOR BLOOD PRESSURE Assessment: Appropriate, Effective, Safe, Accessible Pharmacist Follow up: Assess home and office BP readings, HR, kidney function  Hyperlipidemia/Dyslipidemia (HLD) Last Lipid panel on: 09/14/2021 TC (Goal<200): 193 LDL: 95 HDL (Goal>40): 80 TG (Goal<150): 90 ASCVD 10-year risk?is:: Low (<5%) ASCVD Risk Score: 3.9% Assess this condition today?: Yes LDL Goal: <70 Has patient tried and failed any HLD Medications?: Yes Medications failed: ezetimibe ezetimibe: stomach cramping Check present secondary causes (below) that can lead to increased cholesterol levels (multi-choice optional): Hypothyroidism We discussed: Encouraged increasing fiber to a daily intake of 10-25g/day, How a diet high in plant sterols (fruits/vegetables/nuts/whole grains/legumes) may reduce your  cholesterol., Other (provide details below) Details: Discussed the effectiveness of [place] nuts in reducing LDL Assessment:: Uncontrolled Drug: REPATHA SURECLICK 353 MG/ML SOAJ Inject 1 pen into the skin every 14 (fourteen) day Assessment: Appropriate, Query Effectiveness Drug: rosuvastatin (CRESTOR) 40 MG tablet TAKE 1 TABLET BY MOUTH DAILY FOR CHOLESTEROL Assessment: Appropriate, Query Effectiveness Pharmacist Follow up: LDL recheck, LFTs, s/s rhbdo, injection reaction, flu-like symptoms  Diabetes (DM) Current A1C: 5.3 taken on: 06/07/2021 Previous A1C: 5.4 taken on: 05/13/2020 Type: Pre-Diabetes/Impaired Fasting Glucose Assess this condition today?: Yes Goal A1C: < 6.5 % Type: Pre-Diabetes/Impaired Fasting Glucose We discussed: Modifying lifestyle, including to participate in moderate physical activity (e.g., walking) at least 150 minutes per week., Low carbohydrate eating plan with an emphasis on whole grains, legumes, nuts, fruits, and vegetables and minimal refined and processed foods. Assessment:: Controlled Drug: None Pharmacist Follow up: Assess A1c and FBG  Hypothyroidism Current TSH: 0.07 taken on: 09/14/2021 Previous TSH: 0.51 taken on: 06/07/2021 Current T4: Unknown Current T3: Unknown Assess this condition today?: Yes We discussed: Proper administration of levothyroxine (empty stomach, 30 min before breakfast and with no other meds / vitamins), Monitoring signs / symptoms of hyperthyroidism (weight loss, tachycardia, anxiety, increased sweating) Assessment:: Uncontrolled Drug: levothyroxine (SYNTHROID) 137 MCG tablet Take 137 mcg by mouth daily before breakfast. Take 1 tablet by mouth daily except Wednesdays takes 1/2 tablet Assessment: Appropriate, Query Effectiveness Pharmacist Follow up: TSH recheck 4-6 weeks  Exercise, Diet and Non-Drug Coordination Needs Additional exercise counseling points. We discussed: incorporating flexibility, balance, and strength  training exercises, targeting at least 150 minutes per week of moderate-intensity aerobic exercise. Discussed Non-Drug Care Coordination Needs: Yes Does Patient have Medication financial barriers?: No  Accountable Health Communities Health-Related Social Needs Screening Tool -  SDOH  (BloggerBowl.es) What is your living situation today? (ref #1): I have a steady place to live Think about the  place you live. Do you have problems with any of the following? (ref #2): None of the above Within the past 12 months, you worried that your food would run out before you got money to buy more (ref #3): Never true Within the past 12 months, the food you bought just didn't last and you didn't have money to get more (ref #4): Never true In the past 12 months, has lack of reliable transportation kept you from medical appointments, meetings, work or from getting things needed for daily living? (ref #5): No In the past 12 months, has the electric, gas, oil, or water company threatened to shut off services in your home? (ref #6): No How often does anyone, including family and friends, physically hurt you? (ref #7): Never (1) How often does anyone, including family and friends, insult or talk down to you? (ref #8): Never (1) How often does anyone, including friends and family, threaten you with harm? (ref #9): Never (1) How often does anyone, including family and friends, scream or curse at you? (ref #10): Never (1)  Engagement Notes Newton Pigg on 11/04/2021 04:41 PM CPP Chart Review: 22 min CPP Office Visit: 34 min CPP Office Visit Documentation: 32 min CPP Coordination of Care: [facility] Care Plan Completion: 13 Minutes CPP Care Plan Review: 10 min        Rachelle Hora. Jeannett Senior, PharmD  Clinical Pharmacist  [email or web address]  [phone]

## 2021-11-18 ENCOUNTER — Ambulatory Visit (AMBULATORY_SURGERY_CENTER): Payer: Medicare Other | Admitting: *Deleted

## 2021-11-18 ENCOUNTER — Other Ambulatory Visit: Payer: Self-pay

## 2021-11-18 VITALS — Ht 62.0 in | Wt 204.0 lb

## 2021-11-18 DIAGNOSIS — Z1211 Encounter for screening for malignant neoplasm of colon: Secondary | ICD-10-CM

## 2021-11-18 MED ORDER — NA SULFATE-K SULFATE-MG SULF 17.5-3.13-1.6 GM/177ML PO SOLN: 2.0000 | Freq: Once | ORAL | 0 refills | Status: AC

## 2021-11-18 NOTE — Progress Notes (Signed)
No egg or soy allergy known to patient  °No issues known to pt with past sedation with any surgeries or procedures °Patient denies ever being told they had issues or difficulty with intubation  °No FH of Malignant Hyperthermia °Pt is not on diet pills °Pt is not on  home 02  °Pt is not on blood thinners  °Pt denies issues with constipation  °No A fib or A flutter ° °Pt is fully vaccinated  for Covid  ° °Due to the COVID-19 pandemic we are asking patients to follow certain guidelines in PV and the [facility]   °Pt aware of COVID protocols and [facility] guidelines  ° °PV completed over the phone. Pt verified name, DOB, address and insurance during PV today.  °Pt encouraged to call with questions or issues.  °If pt has My chart, procedure instructions sent via My Chart   °

## 2021-11-20 ENCOUNTER — Encounter: Payer: Self-pay | Admitting: Gastroenterology

## 2021-12-02 ENCOUNTER — Other Ambulatory Visit: Payer: Self-pay

## 2021-12-02 ENCOUNTER — Encounter: Payer: Self-pay | Admitting: Gastroenterology

## 2021-12-02 ENCOUNTER — Ambulatory Visit (AMBULATORY_SURGERY_CENTER): Payer: Medicare Other | Admitting: Gastroenterology

## 2021-12-02 VITALS — BP 116/75 | HR 91 | Temp 98.9°F | Resp 14 | Ht 62.0 in | Wt 204.0 lb

## 2021-12-02 DIAGNOSIS — M797 Fibromyalgia: Secondary | ICD-10-CM | POA: Diagnosis not present

## 2021-12-02 DIAGNOSIS — Z1211 Encounter for screening for malignant neoplasm of colon: Secondary | ICD-10-CM

## 2021-12-02 MED ORDER — SODIUM CHLORIDE 0.9 % IV SOLN: 500.0000 mL | Freq: Once | INTRAVENOUS | Status: DC

## 2021-12-02 NOTE — Progress Notes (Signed)
Pt's states no medical or surgical changes since previsit or office visit. 

## 2021-12-02 NOTE — Progress Notes (Signed)
PT taken to PACU. Monitors in place. VSS. Report given to RN. 

## 2021-12-02 NOTE — Op Note (Signed)
[name] ?Patient Name: Autumn Frye ?Procedure Date: 12/02/2021 1:21 PM ?MRN: [id number] ?Endoscopist: Ladene Artist , MD ?Age: 61 ?Referring MD:  ?Date of Birth: 1961/06/13 ?Gender: Female ?Account #: [id number] ?Procedure:                Colonoscopy ?Indications:              Screening for colorectal malignant neoplasm ?Medicines:                Monitored Anesthesia Care ?Procedure:                Pre-Anesthesia Assessment: ?                          - Prior to the procedure, a History and Physical  ?                          was performed, and patient medications and  ?                          allergies were reviewed. The patient's tolerance of  ?                          previous anesthesia was also reviewed. The risks  ?                          and benefits of the procedure and the sedation  ?                          options and risks were discussed with the patient.  ?                          All questions were answered, and informed consent  ?                          was obtained. Prior Anticoagulants: The patient has  ?                          taken no previous anticoagulant or antiplatelet  ?                          agents. ASA Grade Assessment: II - A patient with  ?                          mild systemic disease. After reviewing the risks  ?                          and benefits, the patient was deemed in  ?                          satisfactory condition to undergo the procedure. ?                          After obtaining informed consent, the colonoscope  ?  was passed under direct vision. Throughout the  ?                          procedure, the patient's blood pressure, pulse, and  ?                          oxygen saturations were monitored continuously. The  ?                          CF [id number] #[id number] was introduced through the anus  ?                          and advanced to the the cecum, identified by  ?                          appendiceal orifice  and ileocecal valve. The  ?                          ileocecal valve, appendiceal orifice, and rectum  ?                          were photographed. The quality of the bowel  ?                          preparation was adequate after substantial lavage,  ?                          suction. The colonoscopy was performed without  ?                          difficulty. The patient tolerated the procedure  ?                          well. ?Scope In: 2:32:13 PM ?Scope Out: 2:49:27 PM ?Scope Withdrawal Time: 0 hours 11 minutes 42 seconds  ?Total Procedure Duration: 0 hours 17 minutes 14 seconds  ?Findings:                 The perianal and digital rectal examinations were  ?                          normal. ?                          A few small-mouthed diverticula were found in the  ?                          left colon. There was no evidence of diverticular  ?                          bleeding. ?                          The exam was otherwise without abnormality on  ?  direct and retroflexion views. ?Complications:            No immediate complications. Estimated blood loss:  ?                          None. ?Estimated Blood Loss:     Estimated blood loss: none. ?Impression:               - Mild diverticulosis in the left colon. ?                          - The examination was otherwise normal on direct  ?                          and retroflexion views. ?                          - No specimens collected. ?Recommendation:           - Repeat colonoscopy in 10 years for screening  ?                          purposes with a more extensive bowel prep. ?                          - Patient has a contact number available for  ?                          emergencies. The signs and symptoms of potential  ?                          delayed complications were discussed with the  ?                          patient. Return to normal activities tomorrow.  ?                          Written discharge  instructions were provided to the  ?                          patient. ?                          - High fiber diet. ?                          - Continue present medications. ?Ladene Artist, MD ?12/02/2021 2:52:15 PM ?This report has been signed electronically. ?

## 2021-12-02 NOTE — Patient Instructions (Signed)
Handouts on diverticulosis and high fiber diet given. ? ?YOU HAD AN ENDOSCOPIC PROCEDURE TODAY AT [facility]:   Refer to the procedure report that was given to you for any specific questions about what was found during the examination.  If the procedure report does not answer your questions, please call your gastroenterologist to clarify.  If you requested that your care partner not be given the details of your procedure findings, then the procedure report has been included in a sealed envelope for you to review at your convenience later. ? ?YOU SHOULD EXPECT: Some feelings of bloating in the abdomen. Passage of more gas than usual.  Walking can help get rid of the air that was put into your GI tract during the procedure and reduce the bloating. If you had a lower endoscopy (such as a colonoscopy or flexible sigmoidoscopy) you may notice spotting of blood in your stool or on the toilet paper. If you underwent a bowel prep for your procedure, you may not have a normal bowel movement for a few days. ? ?Please Note:  You might notice some irritation and congestion in your nose or some drainage.  This is from the oxygen used during your procedure.  There is no need for concern and it should clear up in a day or so. ? ?SYMPTOMS TO REPORT IMMEDIATELY: ? ?Following lower endoscopy (colonoscopy or flexible sigmoidoscopy): ? Excessive amounts of blood in the stool ? Significant tenderness or worsening of abdominal pains ? Swelling of the abdomen that is new, acute ? Fever of 100?F or higher ? ? ?For urgent or emergent issues, a gastroenterologist can be reached at any hour by calling [phone]. ?Do not use MyChart messaging for urgent concerns.  ? ? ?DIET:  We do recommend a small meal at first, but then you may proceed to your regular diet.  Drink plenty of fluids but you should avoid alcoholic beverages for 24 hours. ? ?ACTIVITY:  You should plan to take it easy for the rest of today and you should  NOT DRIVE or use heavy machinery until tomorrow (because of the sedation medicines used during the test).   ? ?FOLLOW UP: ?Our staff will call the number listed on your records 48-72 hours following your procedure to check on you and address any questions or concerns that you may have regarding the information given to you following your procedure. If we do not reach you, we will leave a message.  We will attempt to reach you two times.  During this call, we will ask if you have developed any symptoms of COVID 19. If you develop any symptoms (ie: fever, flu-like symptoms, shortness of breath, cough etc.) before then, please call [phone].  If you test positive for Covid 19 in the 2 weeks post procedure, please call and report this information to [place].   ? ?If any biopsies were taken you will be contacted by phone or by letter within the next 1-3 weeks.  Please call us at [phone] if you have not heard about the biopsies in 3 weeks.  ? ? ?SIGNATURES/CONFIDENTIALITY: ?You and/or your care partner have signed paperwork which will be entered into your electronic medical record.  These signatures attest to the fact that that the information above on your After Visit Summary has been reviewed and is understood.  Full responsibility of the confidentiality of this discharge information lies with you and/or your care-partner.  ?

## 2021-12-02 NOTE — Progress Notes (Signed)
? ?History & Physical ? ?Primary Care Physician:  Unk Pinto, MD ?Primary Gastroenterologist: Lucio Edward, MD ? ?CHIEF COMPLAINT:  CRC screening  ? ?HPI: Autumn Frye is a 61 y.o. female here for CRC screening average risk with colonoscopy. ? ? ?Past Medical History:  ?Diagnosis Date  ? Arthritis   ? OA, RA  ? Asthma   ? with a cold  ? Autoimmune deficiency syndrome ([place])   ? Back pain   ? Cancer [facility])   ? thyroid-1995  ? Family history of adverse reaction to anesthesia   ? mother problems with n/v  ? Fibromyalgia   ? GERD (gastroesophageal reflux disease)   ? Hepatitis   ? 'A" when pt. was 12 yrs. old  ? Hiatal hernia   ? Hyperlipidemia   ? Hypertension   ? Hypothyroidism   ? Pneumonia   ? S/P total knee arthroplasty 02/16/2016  ? ? ?Past Surgical History:  ?Procedure Laterality Date  ? ANTERIOR CERVICAL DECOMP/DISCECTOMY FUSION N/A 05/30/2017  ? Procedure: Cervical five-six, Cervical six-seven Anterior discectomy with fusion and plate fixation;  Surgeon: Ditty, Kevan Ny, MD;  Location: [facility];  Service: Neurosurgery;  Laterality: N/A;  ? ANTERIOR LAT LUMBAR FUSION Left 01/26/2013  ? Procedure: ANTERIOR LATERAL LUMBAR FUSION 1 LEVEL;  Surgeon: Faythe Ghee, MD;  Location: [facility];  Service: Neurosurgery;  Laterality: Left;  lumbar four-five  ? KNEE ARTHROPLASTY Left 02/16/2016  ? Procedure: COMPUTER ASSISTED TOTAL KNEE ARTHROPLASTY;  Surgeon: Dereck Leep, MD;  Location: [facility];  Service: Orthopedics;  Laterality: Left;  ? KNEE SURGERY    ? 3 on right knee, 1 on left-prior to TKR  ? LUMBAR PERCUTANEOUS PEDICLE SCREW 1 LEVEL Left 01/26/2013  ? Procedure: LUMBAR PERCUTANEOUS PEDICLE SCREW 1 LEVEL;  Surgeon: Faythe Ghee, MD;  Location: [facility];  Service: Neurosurgery;  Laterality: Left;  lumbar four-five  ? PILONIDAL CYST EXCISION    ? THYROIDECTOMY    ? TONSILLECTOMY    ? TOTAL KNEE ARTHROPLASTY    ? right  ? UPPER GASTROINTESTINAL ENDOSCOPY    ? VAGINAL HYSTERECTOMY    ? ? ?Prior to  Admission medications   ?Medication Sig Start Date End Date Taking? Authorizing Provider  ?Acetylcysteine (N-ACETYL-L-CYSTEINE PO) Take 1 capsule by mouth daily.   Yes [provider]  ?amitriptyline (ELAVIL) 25 MG tablet Take 1 tablet every Morning & 3 tablets at Bedtime for Chronic Pain 10/05/21  Yes Unk Pinto, MD  ?ASPIRIN 81 PO Take by mouth daily.   Yes [provider]  ?BELBUCA 150 MCG FILM SMARTSIG:1 Strip(s) By Mouth Every 12 Hours 09/14/21  Yes [provider]  ?Cholecalciferol (VITAMIN D3) 5000 UNITS TABS Take 10,000 Units by mouth daily.    Yes [provider]  ?CRANBERRY PO Take 1 tablet by mouth 2 (two) times daily.   Yes [provider]  ?diclofenac Sodium (VOLTAREN) 1 % GEL diclofenac 1 % topical gel ? APP 2 GRAMS TOPICALLY AA TWO TO TID   Yes [provider]  ?doxycycline (VIBRAMYCIN) 50 MG capsule Take 1 capsule (50 mg total) by mouth daily. 09/14/21  Yes Magda Bernheim, NP  ?fexofenadine (ALLEGRA) 180 MG tablet Take 180 mg by mouth daily as needed.   Yes [provider]  ?furosemide (LASIX) 40 MG tablet Take 40 mg by mouth once a week.   Yes [provider]  ?Levomefolate Glucosamine (METHYLFOLATE PO) Take 5,000 mg by mouth daily.   Yes [provider]  ?levothyroxine (SYNTHROID) 137 MCG tablet Take 137 mcg by mouth daily before breakfast. Take 1 tablet by mouth daily except Wednesdays takes 1/2 tablet   Yes [provider]  ?Magnesium Oxide 500 MG TABS Take 1,000 mg by mouth daily.   Yes [provider]  ?MILK THISTLE EXTRACT PO Take 2 capsules by mouth daily.   Yes [provider]  ?montelukast (SINGULAIR) 10 MG tablet TAKE 1 TABLET BY MOUTH DAILY FOR ALLERGIES 08/26/21  Yes Magda Bernheim, NP  ?olmesartan (BENICAR) 20 MG tablet TAKE 1 TABLET BY MOUTH DAILY FOR BLOOD PRESSURE 10/23/21  Yes Magda Bernheim, NP  ?Omega-3 Fatty Acids (FISH OIL) 1200 MG CAPS Take 2 capsules by mouth daily.    Yes  [provider]  ?RESTASIS 0.05 % ophthalmic emulsion Place 1 drop into both eyes daily.  08/07/14  Yes [provider]  ?rosuvastatin (CRESTOR) 40 MG tablet TAKE 1 TABLET BY MOUTH DAILY FOR CHOLESTEROL 10/23/21  Yes Magda Bernheim, NP  ?vitamin B-12 (CYANOCOBALAMIN) 1000 MCG tablet Take 1,000 mcg by mouth daily.   Yes [provider]  ?acetaminophen (TYLENOL) 500 MG tablet Take 1 tablet (500 mg total) by mouth every 6 (six) hours as needed. 06/17/17   Antonietta Breach, PA-C  ?albuterol (PROVENTIL) (2.5 MG/3ML) 0.083% nebulizer solution Take 3 mLs (2.5 mg total) by nebulization every 6 (six) hours as needed for wheezing or shortness of breath. 12/18/18   Liane Comber, NP  ?albuterol (VENTOLIN HFA) 108 (90 Base) MCG/ACT inhaler INHALE 2 PUFFS INTO THE LUNGS EVERY 4 HOURS 15 MINUTES APART AS NEEDED FOR ASTHMA 11/26/20   Liane Comber, NP  ?Black Pepper-Turmeric (TURMERIC CURCUMIN) 01-999 MG CAPS Takes 1 capsule  Daily ?Patient not taking: Reported on 11/18/2021 06/08/21   Unk Pinto, MD  ?Evolocumab [facility] SURECLICK) 476 MG/ML SOAJ Inject 1 pen into the skin every 14 (fourteen) days. 02/04/21   Nahser, Wonda Cheng, MD  ?Eyelid Cleansers (AVENOVA) 0.01 % SOLN Place 1 application into both eyes 2 (two) times daily. Apply to eyelids BID 12/18/18   Liane Comber, NP  ?Merleen Nicely 0.1 MG/24HR RING INSERT 1 RING VAGINALLY EVERY 3 MONTHS ?Patient not taking: Reported on 11/18/2021 08/29/16   Unk Pinto, MD  ?fluticasone furoate-vilanterol (BREO ELLIPTA) 200-25 MCG/INH AEPB Use  1 Inhalation  Daily for Chronic Asthma 01/17/21   Unk Pinto, MD  ?inFLIXimab-axxq (AVSOLA IV) Inject into the vein every 6 (six) weeks.    [provider]  ?liothyronine (CYTOMEL) 5 MCG tablet Take 5 mcg by mouth daily. 11/03/21   [provider]  ?LOTEMAX 0.5 % GEL INSTILL 1 DROP IN BOTH EYES THREE TIMES DAILY ?Patient taking differently: Instill 1 drop 3 times a day as needed 08/08/20   Unk Pinto, MD  ?mupirocin ointment (BACTROBAN) 2 % APPLY EXTERNALLY TO THE AFFECTED AREA THREE TIMES DAILY FOR 10 DAYS 11/20/20   Unk Pinto, MD  ?Polyethyl Glycol-Propyl Glycol 0.4-0.3 % SOLN Place 1 drop into both eyes 4 (four) times daily.     [provider]  ?triamcinolone (NASACORT) 55 MCG/ACT AERO nasal inhaler Place 1 spray into the nose 2 (two) times daily. 09/14/21 09/14/22  Magda Bernheim, NP  ? ? ?Current Outpatient Medications  ?Medication Sig Dispense Refill  ? Acetylcysteine (N-ACETYL-L-CYSTEINE PO) Take 1 capsule by mouth daily.    ? amitriptyline (ELAVIL) 25 MG tablet Take 1 tablet every Morning & 3 tablets at Bedtime for Chronic Pain 360 tablet 3  ? ASPIRIN  81 PO Take by mouth daily.    ? BELBUCA 150 MCG FILM SMARTSIG:1 Strip(s) By Mouth Every 12 Hours    ? Cholecalciferol (VITAMIN D3) 5000 UNITS TABS Take 10,000 Units by mouth daily.     ? CRANBERRY PO Take 1 tablet by mouth 2 (two) times daily.    ? diclofenac Sodium (VOLTAREN) 1 % GEL diclofenac 1 % topical gel ? APP 2 GRAMS TOPICALLY AA TWO TO TID    ? doxycycline (VIBRAMYCIN) 50 MG capsule Take 1 capsule (50 mg total) by mouth daily. 30 capsule 3  ? fexofenadine (ALLEGRA) 180 MG tablet Take 180 mg by mouth daily as needed.    ? furosemide (LASIX) 40 MG tablet Take 40 mg by mouth once a week.    ? Levomefolate Glucosamine (METHYLFOLATE PO) Take 5,000 mg by mouth daily.    ? levothyroxine (SYNTHROID) 137 MCG tablet Take 137 mcg by mouth daily before breakfast. Take 1 tablet by mouth daily except Wednesdays takes 1/2 tablet    ? Magnesium Oxide 500 MG TABS Take 1,000 mg by mouth daily.    ? MILK THISTLE EXTRACT PO Take 2 capsules by mouth daily.    ? montelukast (SINGULAIR) 10 MG tablet TAKE 1 TABLET BY MOUTH DAILY FOR ALLERGIES 90 tablet 3  ? olmesartan (BENICAR) 20 MG tablet TAKE 1 TABLET BY MOUTH DAILY FOR BLOOD PRESSURE 90 tablet 1  ? Omega-3 Fatty Acids (FISH OIL) 1200 MG CAPS Take 2 capsules by mouth daily.     ? RESTASIS 0.05 %  ophthalmic emulsion Place 1 drop into both eyes daily.   3  ? rosuvastatin (CRESTOR) 40 MG tablet TAKE 1 TABLET BY MOUTH DAILY FOR CHOLESTEROL 90 tablet 1  ? vitamin B-12 (CYANOCOBALAMIN) 1000 MCG tablet Take 1,000

## 2021-12-04 ENCOUNTER — Telehealth: Payer: Self-pay | Admitting: *Deleted

## 2021-12-04 DIAGNOSIS — E782 Mixed hyperlipidemia: Secondary | ICD-10-CM | POA: Diagnosis not present

## 2021-12-04 DIAGNOSIS — I10 Essential (primary) hypertension: Secondary | ICD-10-CM | POA: Diagnosis not present

## 2021-12-04 DIAGNOSIS — E890 Postprocedural hypothyroidism: Secondary | ICD-10-CM | POA: Diagnosis not present

## 2021-12-04 NOTE — Telephone Encounter (Signed)
No answer for post procedure followup call. Left VM. ?

## 2021-12-04 NOTE — Telephone Encounter (Signed)
?  Follow up Call- ? ? ?  12/02/2021  ?  1:29 PM  ?Call back number  ?Post procedure Call Back phone  # [phone]  ?Permission to leave phone message Yes  ? No answer, machine did not come on to leave a message ?

## 2021-12-07 DIAGNOSIS — M0609 Rheumatoid arthritis without rheumatoid factor, multiple sites: Secondary | ICD-10-CM | POA: Diagnosis not present

## 2021-12-14 DIAGNOSIS — M25652 Stiffness of left hip, not elsewhere classified: Secondary | ICD-10-CM | POA: Diagnosis not present

## 2021-12-14 DIAGNOSIS — M6281 Muscle weakness (generalized): Secondary | ICD-10-CM | POA: Diagnosis not present

## 2021-12-14 DIAGNOSIS — M25552 Pain in left hip: Secondary | ICD-10-CM | POA: Diagnosis not present

## 2021-12-14 DIAGNOSIS — M25551 Pain in right hip: Secondary | ICD-10-CM | POA: Diagnosis not present

## 2021-12-14 DIAGNOSIS — M25651 Stiffness of right hip, not elsewhere classified: Secondary | ICD-10-CM | POA: Diagnosis not present

## 2021-12-16 DIAGNOSIS — M6281 Muscle weakness (generalized): Secondary | ICD-10-CM | POA: Diagnosis not present

## 2021-12-16 DIAGNOSIS — M25552 Pain in left hip: Secondary | ICD-10-CM | POA: Diagnosis not present

## 2021-12-16 DIAGNOSIS — M25652 Stiffness of left hip, not elsewhere classified: Secondary | ICD-10-CM | POA: Diagnosis not present

## 2021-12-16 DIAGNOSIS — M25551 Pain in right hip: Secondary | ICD-10-CM | POA: Diagnosis not present

## 2021-12-16 DIAGNOSIS — M25651 Stiffness of right hip, not elsewhere classified: Secondary | ICD-10-CM | POA: Diagnosis not present

## 2021-12-21 DIAGNOSIS — M25652 Stiffness of left hip, not elsewhere classified: Secondary | ICD-10-CM | POA: Diagnosis not present

## 2021-12-21 DIAGNOSIS — M25552 Pain in left hip: Secondary | ICD-10-CM | POA: Diagnosis not present

## 2021-12-21 DIAGNOSIS — M25651 Stiffness of right hip, not elsewhere classified: Secondary | ICD-10-CM | POA: Diagnosis not present

## 2021-12-21 DIAGNOSIS — M6281 Muscle weakness (generalized): Secondary | ICD-10-CM | POA: Diagnosis not present

## 2021-12-21 DIAGNOSIS — M25551 Pain in right hip: Secondary | ICD-10-CM | POA: Diagnosis not present

## 2021-12-23 DIAGNOSIS — M25552 Pain in left hip: Secondary | ICD-10-CM | POA: Diagnosis not present

## 2021-12-23 DIAGNOSIS — M6281 Muscle weakness (generalized): Secondary | ICD-10-CM | POA: Diagnosis not present

## 2021-12-23 DIAGNOSIS — M25551 Pain in right hip: Secondary | ICD-10-CM | POA: Diagnosis not present

## 2021-12-23 DIAGNOSIS — M25652 Stiffness of left hip, not elsewhere classified: Secondary | ICD-10-CM | POA: Diagnosis not present

## 2021-12-23 DIAGNOSIS — M25651 Stiffness of right hip, not elsewhere classified: Secondary | ICD-10-CM | POA: Diagnosis not present

## 2021-12-27 ENCOUNTER — Other Ambulatory Visit: Payer: Self-pay | Admitting: Cardiovascular Disease

## 2021-12-28 DIAGNOSIS — M25552 Pain in left hip: Secondary | ICD-10-CM | POA: Diagnosis not present

## 2021-12-28 DIAGNOSIS — M25651 Stiffness of right hip, not elsewhere classified: Secondary | ICD-10-CM | POA: Diagnosis not present

## 2021-12-28 DIAGNOSIS — M6281 Muscle weakness (generalized): Secondary | ICD-10-CM | POA: Diagnosis not present

## 2021-12-28 DIAGNOSIS — M25652 Stiffness of left hip, not elsewhere classified: Secondary | ICD-10-CM | POA: Diagnosis not present

## 2021-12-28 DIAGNOSIS — M25551 Pain in right hip: Secondary | ICD-10-CM | POA: Diagnosis not present

## 2021-12-28 MED ORDER — REPATHA SURECLICK 140 MG/ML ~~LOC~~ SOAJ: 1.0000 mL | SUBCUTANEOUS | 3 refills | Status: DC

## 2021-12-28 NOTE — Addendum Note (Signed)
Addended by: Marcelle Overlie D on: 12/28/2021 11:10 AM ? ? Modules accepted: Orders ? ?

## 2022-01-04 DIAGNOSIS — M6281 Muscle weakness (generalized): Secondary | ICD-10-CM | POA: Diagnosis not present

## 2022-01-04 DIAGNOSIS — M25652 Stiffness of left hip, not elsewhere classified: Secondary | ICD-10-CM | POA: Diagnosis not present

## 2022-01-04 DIAGNOSIS — M25552 Pain in left hip: Secondary | ICD-10-CM | POA: Diagnosis not present

## 2022-01-04 DIAGNOSIS — M25551 Pain in right hip: Secondary | ICD-10-CM | POA: Diagnosis not present

## 2022-01-04 DIAGNOSIS — M25651 Stiffness of right hip, not elsewhere classified: Secondary | ICD-10-CM | POA: Diagnosis not present

## 2022-01-06 DIAGNOSIS — M25652 Stiffness of left hip, not elsewhere classified: Secondary | ICD-10-CM | POA: Diagnosis not present

## 2022-01-06 DIAGNOSIS — M25651 Stiffness of right hip, not elsewhere classified: Secondary | ICD-10-CM | POA: Diagnosis not present

## 2022-01-06 DIAGNOSIS — M6281 Muscle weakness (generalized): Secondary | ICD-10-CM | POA: Diagnosis not present

## 2022-01-06 DIAGNOSIS — M25551 Pain in right hip: Secondary | ICD-10-CM | POA: Diagnosis not present

## 2022-01-06 DIAGNOSIS — M25552 Pain in left hip: Secondary | ICD-10-CM | POA: Diagnosis not present

## 2022-01-11 DIAGNOSIS — M25552 Pain in left hip: Secondary | ICD-10-CM | POA: Diagnosis not present

## 2022-01-11 DIAGNOSIS — M6281 Muscle weakness (generalized): Secondary | ICD-10-CM | POA: Diagnosis not present

## 2022-01-11 DIAGNOSIS — M25551 Pain in right hip: Secondary | ICD-10-CM | POA: Diagnosis not present

## 2022-01-11 DIAGNOSIS — M25651 Stiffness of right hip, not elsewhere classified: Secondary | ICD-10-CM | POA: Diagnosis not present

## 2022-01-11 DIAGNOSIS — M25652 Stiffness of left hip, not elsewhere classified: Secondary | ICD-10-CM | POA: Diagnosis not present

## 2022-01-12 ENCOUNTER — Ambulatory Visit: Payer: Medicare Other | Admitting: Adult Health

## 2022-01-13 DIAGNOSIS — M25651 Stiffness of right hip, not elsewhere classified: Secondary | ICD-10-CM | POA: Diagnosis not present

## 2022-01-13 DIAGNOSIS — M6281 Muscle weakness (generalized): Secondary | ICD-10-CM | POA: Diagnosis not present

## 2022-01-13 DIAGNOSIS — M25652 Stiffness of left hip, not elsewhere classified: Secondary | ICD-10-CM | POA: Diagnosis not present

## 2022-01-13 DIAGNOSIS — M25551 Pain in right hip: Secondary | ICD-10-CM | POA: Diagnosis not present

## 2022-01-13 DIAGNOSIS — M25552 Pain in left hip: Secondary | ICD-10-CM | POA: Diagnosis not present

## 2022-01-25 ENCOUNTER — Other Ambulatory Visit: Payer: Self-pay | Admitting: Internal Medicine

## 2022-01-25 DIAGNOSIS — K2960 Other gastritis without bleeding: Secondary | ICD-10-CM

## 2022-01-27 DIAGNOSIS — M130 Polyarthritis, unspecified: Secondary | ICD-10-CM | POA: Diagnosis not present

## 2022-01-27 DIAGNOSIS — M25551 Pain in right hip: Secondary | ICD-10-CM | POA: Diagnosis not present

## 2022-01-27 DIAGNOSIS — M549 Dorsalgia, unspecified: Secondary | ICD-10-CM | POA: Diagnosis not present

## 2022-01-27 DIAGNOSIS — M25651 Stiffness of right hip, not elsewhere classified: Secondary | ICD-10-CM | POA: Diagnosis not present

## 2022-01-27 DIAGNOSIS — M25552 Pain in left hip: Secondary | ICD-10-CM | POA: Diagnosis not present

## 2022-01-27 DIAGNOSIS — M6281 Muscle weakness (generalized): Secondary | ICD-10-CM | POA: Diagnosis not present

## 2022-01-27 DIAGNOSIS — Z79899 Other long term (current) drug therapy: Secondary | ICD-10-CM | POA: Diagnosis not present

## 2022-01-27 DIAGNOSIS — M25652 Stiffness of left hip, not elsewhere classified: Secondary | ICD-10-CM | POA: Diagnosis not present

## 2022-01-27 DIAGNOSIS — H04123 Dry eye syndrome of bilateral lacrimal glands: Secondary | ICD-10-CM | POA: Diagnosis not present

## 2022-01-27 DIAGNOSIS — Z1159 Encounter for screening for other viral diseases: Secondary | ICD-10-CM | POA: Diagnosis not present

## 2022-01-27 DIAGNOSIS — R5383 Other fatigue: Secondary | ICD-10-CM | POA: Diagnosis not present

## 2022-01-27 DIAGNOSIS — M0609 Rheumatoid arthritis without rheumatoid factor, multiple sites: Secondary | ICD-10-CM | POA: Diagnosis not present

## 2022-01-27 DIAGNOSIS — M1990 Unspecified osteoarthritis, unspecified site: Secondary | ICD-10-CM | POA: Diagnosis not present

## 2022-01-27 DIAGNOSIS — E039 Hypothyroidism, unspecified: Secondary | ICD-10-CM | POA: Diagnosis not present

## 2022-01-27 DIAGNOSIS — M797 Fibromyalgia: Secondary | ICD-10-CM | POA: Diagnosis not present

## 2022-02-05 DIAGNOSIS — M25552 Pain in left hip: Secondary | ICD-10-CM | POA: Diagnosis not present

## 2022-02-05 DIAGNOSIS — M25551 Pain in right hip: Secondary | ICD-10-CM | POA: Diagnosis not present

## 2022-02-05 DIAGNOSIS — M6281 Muscle weakness (generalized): Secondary | ICD-10-CM | POA: Diagnosis not present

## 2022-02-05 DIAGNOSIS — M25651 Stiffness of right hip, not elsewhere classified: Secondary | ICD-10-CM | POA: Diagnosis not present

## 2022-02-05 DIAGNOSIS — M25652 Stiffness of left hip, not elsewhere classified: Secondary | ICD-10-CM | POA: Diagnosis not present

## 2022-02-10 DIAGNOSIS — M25552 Pain in left hip: Secondary | ICD-10-CM | POA: Diagnosis not present

## 2022-02-10 DIAGNOSIS — M25651 Stiffness of right hip, not elsewhere classified: Secondary | ICD-10-CM | POA: Diagnosis not present

## 2022-02-10 DIAGNOSIS — M25551 Pain in right hip: Secondary | ICD-10-CM | POA: Diagnosis not present

## 2022-02-10 DIAGNOSIS — M25652 Stiffness of left hip, not elsewhere classified: Secondary | ICD-10-CM | POA: Diagnosis not present

## 2022-02-10 DIAGNOSIS — M6281 Muscle weakness (generalized): Secondary | ICD-10-CM | POA: Diagnosis not present

## 2022-02-16 ENCOUNTER — Other Ambulatory Visit: Payer: Self-pay | Admitting: Nurse Practitioner

## 2022-02-16 MED ORDER — FUROSEMIDE 40 MG PO TABS: 40.0000 mg | ORAL_TABLET | ORAL | 2 refills | Status: DC

## 2022-02-24 ENCOUNTER — Ambulatory Visit: Payer: Medicare Other | Admitting: Nurse Practitioner

## 2022-02-24 NOTE — Progress Notes (Deleted)
Assessment and Plan:  There are no diagnoses linked to this encounter.    Further disposition pending results of labs. Discussed med's effects and SE's.   Over 30 minutes of exam, counseling, chart review, and critical decision making was performed.   Future Appointments  Date Time Provider [place]  02/24/2022  2:45 PM Alycia Rossetti, NP [facility] None  03/02/2022  4:00 PM Liane Comber, NP [facility] None  03/23/2022  3:00 PM Carleene Mains, RPH [facility] None  06/08/2022 11:00 AM Unk Pinto, MD [facility] None    ------------------------------------------------------------------------------------------------------------------   HPI LMP  (LMP Unknown)  61 y.o.female presents for  Past Medical History:  Diagnosis Date   Arthritis    OA, RA   Asthma    with a cold   Autoimmune deficiency syndrome ([place])    Back pain    Cancer ([place])    thyroid-1995   Family history of adverse reaction to anesthesia    mother problems with n/v   Fibromyalgia    GERD (gastroesophageal reflux disease)    Hepatitis    'A" when pt. was 12 yrs. old   Hiatal hernia    Hyperlipidemia    Hypertension    Hypothyroidism    Pneumonia    S/P total knee arthroplasty 02/16/2016     Allergies  Allergen Reactions   Ciprofloxacin Hives and Swelling    SWELLING REACTION UNSPECIFIED    Levaquin [Levofloxacin] Hives   Lyrica [Pregabalin] Other (See Comments)    Altered mental status ALTERED MENTAL STATUS HALLUCINATIONS   Compazine [Prochlorperazine Edisylate]     UNSPECIFIED REACTION    Gabapentin     Diarrhea and nausea   Septra [Sulfamethoxazole-Trimethoprim]     itch   Xeljanz [Tofacitinib] Hives   Zetia [Ezetimibe]     Stomach cramping   Hydrocodone Itching   Morphine And Related Itching   Penicillins Itching   Sulfa Antibiotics Itching    Current Outpatient Medications on File Prior to Visit  Medication Sig   acetaminophen (TYLENOL) 500 MG tablet Take 1 tablet  (500 mg total) by mouth every 6 (six) hours as needed.   Acetylcysteine (N-ACETYL-L-CYSTEINE PO) Take 1 capsule by mouth daily.   albuterol (PROVENTIL) (2.5 MG/3ML) 0.083% nebulizer solution Take 3 mLs (2.5 mg total) by nebulization every 6 (six) hours as needed for wheezing or shortness of breath.   albuterol (VENTOLIN HFA) 108 (90 Base) MCG/ACT inhaler INHALE 2 PUFFS INTO THE LUNGS EVERY 4 HOURS 15 MINUTES APART AS NEEDED FOR ASTHMA   amitriptyline (ELAVIL) 25 MG tablet Take 1 tablet every Morning & 3 tablets at Bedtime for Chronic Pain   ASPIRIN 81 PO Take by mouth daily.   BELBUCA 150 MCG FILM SMARTSIG:1 Strip(s) By Mouth Every 12 Hours   Black Pepper-Turmeric (TURMERIC CURCUMIN) 01-999 MG CAPS Takes 1 capsule  Daily (Patient not taking: Reported on 11/18/2021)   Cholecalciferol (VITAMIN D3) 5000 UNITS TABS Take 10,000 Units by mouth daily.    CRANBERRY PO Take 1 tablet by mouth 2 (two) times daily.   diclofenac Sodium (VOLTAREN) 1 % GEL diclofenac 1 % topical gel  APP 2 GRAMS TOPICALLY AA TWO TO TID   doxycycline (VIBRAMYCIN) 50 MG capsule Take 1 capsule (50 mg total) by mouth daily.   Evolocumab (REPATHA SURECLICK) 563 MG/ML SOAJ Inject 1 mL into the skin every 14 (fourteen) days.   Eyelid Cleansers (AVENOVA) 0.01 % SOLN Place 1 application into both eyes 2 (two) times daily. Apply to eyelids BID  FEMRING 0.1 MG/24HR RING INSERT 1 RING VAGINALLY EVERY 3 MONTHS (Patient not taking: Reported on 11/18/2021)   fexofenadine (ALLEGRA) 180 MG tablet Take 180 mg by mouth daily as needed.   fluticasone furoate-vilanterol (BREO ELLIPTA) 200-25 MCG/INH AEPB Use  1 Inhalation  Daily for Chronic Asthma   furosemide (LASIX) 40 MG tablet Take 1 tablet (40 mg total) by mouth once a week.   inFLIXimab-axxq (AVSOLA IV) Inject into the vein every 6 (six) weeks.   Levomefolate Glucosamine (METHYLFOLATE PO) Take 5,000 mg by mouth daily.   levothyroxine (SYNTHROID) 137 MCG tablet Take 137 mcg by mouth daily  before breakfast. Take 1 tablet by mouth daily except Wednesdays takes 1/2 tablet   liothyronine (CYTOMEL) 5 MCG tablet Take 5 mcg by mouth daily.   LOTEMAX 0.5 % GEL INSTILL 1 DROP IN BOTH EYES THREE TIMES DAILY (Patient taking differently: Instill 1 drop 3 times a day as needed)   Magnesium Oxide 500 MG TABS Take 1,000 mg by mouth daily.   MILK THISTLE EXTRACT PO Take 2 capsules by mouth daily.   montelukast (SINGULAIR) 10 MG tablet TAKE 1 TABLET BY MOUTH DAILY FOR ALLERGIES   mupirocin ointment (BACTROBAN) 2 % APPLY EXTERNALLY TO THE AFFECTED AREA THREE TIMES DAILY FOR 10 DAYS   olmesartan (BENICAR) 20 MG tablet TAKE 1 TABLET BY MOUTH DAILY FOR BLOOD PRESSURE   Omega-3 Fatty Acids (FISH OIL) 1200 MG CAPS Take 2 capsules by mouth daily.    Polyethyl Glycol-Propyl Glycol 0.4-0.3 % SOLN Place 1 drop into both eyes 4 (four) times daily.    RESTASIS 0.05 % ophthalmic emulsion Place 1 drop into both eyes daily.    rosuvastatin (CRESTOR) 40 MG tablet TAKE 1 TABLET BY MOUTH DAILY FOR CHOLESTEROL   triamcinolone (NASACORT) 55 MCG/ACT AERO nasal inhaler Place 1 spray into the nose 2 (two) times daily.   vitamin B-12 (CYANOCOBALAMIN) 1000 MCG tablet Take 1,000 mcg by mouth daily.   No current facility-administered medications on file prior to visit.    ROS: all negative except above.   Physical Exam:  LMP  (LMP Unknown)   General Appearance: Well nourished, in no apparent distress. Eyes: PERRLA, EOMs, conjunctiva no swelling or erythema Sinuses: No Frontal/maxillary tenderness ENT/Mouth: Ext aud canals clear, TMs without erythema, bulging. No erythema, swelling, or exudate on post pharynx.  Tonsils not swollen or erythematous. Hearing normal.  Neck: Supple, thyroid normal.  Respiratory: Respiratory effort normal, BS equal bilaterally without rales, rhonchi, wheezing or stridor.  Cardio: RRR with no MRGs. Brisk peripheral pulses without edema.  Abdomen: Soft, + BS.  Non tender, no guarding,  rebound, hernias, masses. Lymphatics: Non tender without lymphadenopathy.  Musculoskeletal: Full ROM, 5/5 strength, normal gait.  Skin: Warm, dry without rashes, lesions, ecchymosis.  Neuro: Cranial nerves intact. Normal muscle tone, no cerebellar symptoms. Sensation intact.  Psych: Awake and oriented X 3, normal affect, Insight and Judgment appropriate.     Alycia Rossetti, NP 11:36 AM [facility]

## 2022-02-26 ENCOUNTER — Encounter: Payer: Self-pay | Admitting: Nurse Practitioner

## 2022-02-26 ENCOUNTER — Ambulatory Visit: Payer: Medicare Other | Admitting: Nurse Practitioner

## 2022-02-26 ENCOUNTER — Ambulatory Visit (INDEPENDENT_AMBULATORY_CARE_PROVIDER_SITE_OTHER): Payer: Medicare Other | Admitting: Nurse Practitioner

## 2022-02-26 VITALS — BP 110/70 | HR 89 | Temp 97.0°F | Wt 201.0 lb

## 2022-02-26 DIAGNOSIS — R051 Acute cough: Secondary | ICD-10-CM | POA: Diagnosis not present

## 2022-02-26 DIAGNOSIS — J069 Acute upper respiratory infection, unspecified: Secondary | ICD-10-CM | POA: Diagnosis not present

## 2022-02-26 DIAGNOSIS — E669 Obesity, unspecified: Secondary | ICD-10-CM | POA: Diagnosis not present

## 2022-02-26 DIAGNOSIS — R0981 Nasal congestion: Secondary | ICD-10-CM | POA: Diagnosis not present

## 2022-03-02 ENCOUNTER — Encounter: Payer: Self-pay | Admitting: Adult Health

## 2022-03-02 ENCOUNTER — Ambulatory Visit (INDEPENDENT_AMBULATORY_CARE_PROVIDER_SITE_OTHER): Payer: Medicare Other | Admitting: Adult Health

## 2022-03-02 VITALS — BP 118/70 | HR 74 | Temp 97.7°F | Wt 202.0 lb

## 2022-03-02 DIAGNOSIS — J45909 Unspecified asthma, uncomplicated: Secondary | ICD-10-CM | POA: Diagnosis not present

## 2022-03-02 DIAGNOSIS — R6889 Other general symptoms and signs: Secondary | ICD-10-CM

## 2022-03-02 DIAGNOSIS — I6529 Occlusion and stenosis of unspecified carotid artery: Secondary | ICD-10-CM

## 2022-03-02 DIAGNOSIS — E890 Postprocedural hypothyroidism: Secondary | ICD-10-CM

## 2022-03-02 DIAGNOSIS — Z0001 Encounter for general adult medical examination with abnormal findings: Secondary | ICD-10-CM | POA: Diagnosis not present

## 2022-03-02 DIAGNOSIS — M0579 Rheumatoid arthritis with rheumatoid factor of multiple sites without organ or systems involvement: Secondary | ICD-10-CM | POA: Diagnosis not present

## 2022-03-02 DIAGNOSIS — E559 Vitamin D deficiency, unspecified: Secondary | ICD-10-CM

## 2022-03-02 DIAGNOSIS — I10 Essential (primary) hypertension: Secondary | ICD-10-CM

## 2022-03-02 DIAGNOSIS — L732 Hidradenitis suppurativa: Secondary | ICD-10-CM | POA: Diagnosis not present

## 2022-03-02 DIAGNOSIS — M3500 Sicca syndrome, unspecified: Secondary | ICD-10-CM

## 2022-03-02 DIAGNOSIS — Z79899 Other long term (current) drug therapy: Secondary | ICD-10-CM

## 2022-03-02 DIAGNOSIS — E782 Mixed hyperlipidemia: Secondary | ICD-10-CM

## 2022-03-02 DIAGNOSIS — I2510 Atherosclerotic heart disease of native coronary artery without angina pectoris: Secondary | ICD-10-CM | POA: Diagnosis not present

## 2022-03-02 DIAGNOSIS — E8881 Metabolic syndrome: Secondary | ICD-10-CM

## 2022-03-02 DIAGNOSIS — I700 Atherosclerosis of aorta: Secondary | ICD-10-CM

## 2022-03-02 DIAGNOSIS — R7309 Other abnormal glucose: Secondary | ICD-10-CM

## 2022-03-02 DIAGNOSIS — Z0000 Encounter for general adult medical examination without abnormal findings: Secondary | ICD-10-CM

## 2022-03-02 DIAGNOSIS — Z87891 Personal history of nicotine dependence: Secondary | ICD-10-CM

## 2022-03-02 NOTE — Progress Notes (Signed)
MEDICARE ANNUAL WELLNESS VISIT AND FOLLOW UP  Assessment:    Diagnoses and all orders for this visit:  Annual Medicare Wellness Visit Due annually  Health maintenance reviewed  Atherosclerosis of aorta (HCC) - CT 06/2018 Control blood pressure, cholesterol, glucose, increase exercise.   CAD Low risk myoview 01/2021 Dr. Elease Hashimoto following LDL goal <70 - lipid clinic  Control blood pressure, cholesterol, glucose, increase exercise. Reminder to go to the ER if any CP, SOB, nausea, dizziness, severe fatigue   Essential hypertension Continue medication Monitor blood pressure at home; call if consistently over 130/80 Continue DASH diet.   Reminder to go to the ER if any CP, SOB, nausea, dizziness, severe HA, changes vision/speech, left arm numbness and tingling and jaw pain.  Intrinsic asthma Stable on inhalers/meds  Sjogren's syndrome, with unspecified organ involvement (HCC) On DMARD, followed by rheumatology  Rheumatoid arthritis involving multiple sites, unspecified rheumatoid factor presence (HCC) Managed by Dr. Kathi Ludwig, on DMARD  Cervical spondylosis with radiculopathy Currently fairly managed; followed by Dr. Kathi Ludwig and Dr. Bevely Palmer  Degeneration of intervertebral disc of lumbosacral region Continue NSAID PRN, follows rheum  Postoperative hypothyroidism Dr. Talmage Nap continue medications the same pending lab results reminded to take on an empty stomach 30-82mins before food.  check TSH level  Vitamin D deficiency At goal at recent check; continue to recommend supplementation for goal of 70-100 Defer vitamin D level to CPE next OV  Other abnormal glucose Off of metformin Discussed disease and risks Discussed diet/exercise, weight management  A1C q61m;   Morbid obesity (HCC) - BMI 35+ with htn, hyperlipidemia  Long discussion about weight loss, diet, and exercise Recommended diet heavy in fruits and veggies and low in animal meats, cheeses, and dairy products,  appropriate calorie intake Discussed appropriate weight for height  Follow up at next visit  Mixed hyperlipidemia Continue medications; rosuvastatin 40 mg daily, repatha q2w Discussed LDL goal <70 Continue low cholesterol diet and exercise.  Defer lipid panel to lipid clinic  Medication management CBC, CMP/GFR, magnesium at routine visits  Hidradenitis suppurativa Doxycycline, endo follows  Former smoker 30 pack year, quit 2015, last CT screening 07/2021   Orders Placed This Encounter  Procedures   CBC with Differential/Platelet   COMPLETE METABOLIC PANEL WITH GFR   Magnesium   Lipid panel   TSH   Hemoglobin A1c     Over 40 minutes of exam, counseling, chart review and critical decision making was performed Future Appointments  Date Time Provider Department Center  03/23/2022  3:00 PM Sundra Aland, RPH [facility] None  06/08/2022 11:00 AM Lucky Cowboy, MD [facility] None  12/08/2022  4:00 PM Raynelle Dick, NP [facility] None     Plan:   During the course of the visit the patient was educated and counseled about appropriate screening and preventive services including:   Pneumococcal vaccine  Prevnar 13 Influenza vaccine Td vaccine Screening electrocardiogram Bone densitometry screening Colorectal cancer screening Diabetes screening Glaucoma screening Nutrition counseling  Advanced directives: requested   Subjective:  Autumn Frye is a 61 y.o. female who presents for Medicare Annual Wellness Visit and 3 month follow up. She has Intrinsic asthma; Essential hypertension; Hypothyroidism; Hyperlipidemia, mixed; Vitamin D deficiency; Medication management; Sjogren's disease (HCC); Rheumatoid arthritis involving multiple sites with positive rheumatoid factor (HCC); Degeneration of intervertebral disc of lumbosacral region; Cervical spondylosis with radiculopathy; Abnormal glucose; Morbid obesity (HCC)- BMI 35+ with htn, hld, CVD; Hidradenitis suppurativa;  Aortic atherosclerosis (HCC) by Chest CT scan  May 2021;  Former smoker, 30 pack year, quit 2015; Insulin resistance; Carotid stenosis; and CAD in native artery on their problem list.   Patient is followed by Dr Kathi Ludwig  for Rheumatoid Arthritis and Sjogren's since 2009. Recently in process of transitioning from avsola influsions to symponi per patient.  Chronic neck/lumbar pain, has spinal stimulator, no longer needing buprenorphine patches, follow up Dr. Gala Romney only as needed.   Dr. Talmage Nap with endocrinology following for hypothyroid and hidradenitis suppurativa. She has been on doxycycline 50 mg daily for many years as prophylaxis for this issue.  Asthma is controlled by singulaire daily, BREO PRN if has a cold.  No recent flares.   BMI is Body mass index is 36.95 kg/m., she has been working on diet and exercise, going to the GYM and swims, also tries to walk a few days a week.  would like to get down to 140 lb or so, cutting out starch and sugar.  Reports scale is steady, but losing inches around waist Has been following with Dr. Milly Jakob "Adapt your life" program online via [facility].  Wt Readings from Last 3 Encounters:  03/02/22 202 lb (91.6 kg)  02/26/22 201 lb (91.2 kg)  12/02/21 204 lb (92.5 kg)   Former smoker, 30 pack year history, quit in 2015, had benign lungs on low dose CT screening 07/15/2021  CT did show aortic atherosclerosis and coronary artery calcifications.  CT coronary calcium 11/2020 showed total calcium score 139, 90-100% for age/gender, Dr. Elease Hashimoto is following.  She did have low risk myoview on 01/20/2021.   She has aortic atherosclerosis per abd CT 06/2018.  Her blood pressure has been controlled at home, today their BP is BP: 118/70 She does workout. She denies chest pain, shortness of breath, dizziness.   She is on cholesterol medication (rosuvastatin 40 mg daily, repatha q2w and tolerating well, follows with lipid clinic, labile LDLs, reports fasting today except  coffee with cream this AM) and denies myalgias. Her LDL cholesterol is not at goal last check, has been at goal previously. The cholesterol last visit was:   Lab Results  Component Value Date   CHOL 193 09/14/2021   HDL 80 09/14/2021   LDLCALC 95 09/14/2021   TRIG 90 09/14/2021   CHOLHDL 2.4 09/14/2021    She has been working on diet and exercise for hx of prediabetes formerly on metformin but has been able to taper off, and denies foot ulcerations, increased appetite, nausea, paresthesia of the feet, polydipsia, polyuria and visual disturbances. Last A1C in the office was:  Lab Results  Component Value Date   HGBA1C 5.3 06/08/2021   She is on thyroid medication. Her medication was changed last visit, taking 150 mcg synthroid daily and 1/2 tab on Wednesday.  Lab Results  Component Value Date   TSH 0.07 (L) 09/14/2021   Last GFR: Lab Results  Component Value Date   GFRNONAA 98 03/02/2021   Patient is on Vitamin D supplement.   Lab Results  Component Value Date   VD25OH 62 06/08/2021      Medication Review: Current Outpatient Medications on File Prior to Visit  Medication Sig Dispense Refill   acetaminophen (TYLENOL) 500 MG tablet Take 1 tablet (500 mg total) by mouth every 6 (six) hours as needed. 30 tablet 0   Acetylcysteine (N-ACETYL-L-CYSTEINE PO) Take 1 capsule by mouth daily.     albuterol (PROVENTIL) (2.5 MG/3ML) 0.083% nebulizer solution Take 3 mLs (2.5 mg total) by nebulization every 6 (six) hours as needed  for wheezing or shortness of breath. 60 vial 1   albuterol (VENTOLIN HFA) 108 (90 Base) MCG/ACT inhaler INHALE 2 PUFFS INTO THE LUNGS EVERY 4 HOURS 15 MINUTES APART AS NEEDED FOR ASTHMA 40.2 g 1   amitriptyline (ELAVIL) 25 MG tablet Take 1 tablet every Morning & 3 tablets at Bedtime for Chronic Pain 360 tablet 3   ASPIRIN 81 PO Take by mouth daily.     BELBUCA 150 MCG FILM SMARTSIG:1 Strip(s) By Mouth Every 12 Hours     Black Pepper-Turmeric (TURMERIC CURCUMIN)  01-999 MG CAPS Takes 1 capsule  Daily     Cholecalciferol (VITAMIN D3) 5000 UNITS TABS Take 10,000 Units by mouth daily.      CRANBERRY PO Take 1 tablet by mouth 2 (two) times daily.     diclofenac Sodium (VOLTAREN) 1 % GEL diclofenac 1 % topical gel  APP 2 GRAMS TOPICALLY AA TWO TO TID     doxycycline (VIBRAMYCIN) 50 MG capsule Take 1 capsule (50 mg total) by mouth daily. 30 capsule 3   Evolocumab (REPATHA SURECLICK) 140 MG/ML SOAJ Inject 1 mL into the skin every 14 (fourteen) days. 6 mL 3   Eyelid Cleansers (AVENOVA) 0.01 % SOLN Place 1 application into both eyes 2 (two) times daily. Apply to eyelids BID 1 Bottle 1   FEMRING 0.1 MG/24HR RING INSERT 1 RING VAGINALLY EVERY 3 MONTHS 1 each 3   fexofenadine (ALLEGRA) 180 MG tablet Take 180 mg by mouth daily as needed.     fluticasone furoate-vilanterol (BREO ELLIPTA) 200-25 MCG/INH AEPB Use  1 Inhalation  Daily for Chronic Asthma 180 each 3   furosemide (LASIX) 40 MG tablet Take 1 tablet (40 mg total) by mouth once a week. 30 tablet 2   Golimumab (SIMPONI ARIA IV) Inject 2 mg/kg into the vein every 8 (eight) weeks.     Levomefolate Glucosamine (METHYLFOLATE PO) Take 5,000 mg by mouth daily.     levothyroxine (SYNTHROID) 137 MCG tablet Take 137 mcg by mouth daily before breakfast. Take 1 tablet by mouth daily except Wednesdays takes 1/2 tablet     liothyronine (CYTOMEL) 5 MCG tablet Take 5 mcg by mouth daily.     LOTEMAX 0.5 % GEL INSTILL 1 DROP IN BOTH EYES THREE TIMES DAILY (Patient taking differently: Instill 1 drop 3 times a day as needed) 5 g 0   Magnesium Oxide 500 MG TABS Take 1,000 mg by mouth daily.     MILK THISTLE EXTRACT PO Take 2 capsules by mouth daily.     montelukast (SINGULAIR) 10 MG tablet TAKE 1 TABLET BY MOUTH DAILY FOR ALLERGIES 90 tablet 3   mupirocin ointment (BACTROBAN) 2 % APPLY EXTERNALLY TO THE AFFECTED AREA THREE TIMES DAILY FOR 10 DAYS 22 g 3   olmesartan (BENICAR) 20 MG tablet TAKE 1 TABLET BY MOUTH DAILY FOR BLOOD  PRESSURE 90 tablet 1   Omega-3 Fatty Acids (FISH OIL) 1200 MG CAPS Take 2 capsules by mouth daily.      Polyethyl Glycol-Propyl Glycol 0.4-0.3 % SOLN Place 1 drop into both eyes 4 (four) times daily.      RESTASIS 0.05 % ophthalmic emulsion Place 1 drop into both eyes daily.   3   rosuvastatin (CRESTOR) 40 MG tablet TAKE 1 TABLET BY MOUTH DAILY FOR CHOLESTEROL 90 tablet 1   triamcinolone (NASACORT) 55 MCG/ACT AERO nasal inhaler Place 1 spray into the nose 2 (two) times daily. 1 each 2   vitamin B-12 (CYANOCOBALAMIN) 1000 MCG tablet Take  1,000 mcg by mouth daily.     No current facility-administered medications on file prior to visit.    Allergies  Allergen Reactions   Ciprofloxacin Hives and Swelling    SWELLING REACTION UNSPECIFIED    Levaquin [Levofloxacin] Hives   Lyrica [Pregabalin] Other (See Comments)    Altered mental status ALTERED MENTAL STATUS HALLUCINATIONS   Compazine [Prochlorperazine Edisylate]     UNSPECIFIED REACTION    Gabapentin     Diarrhea and nausea   Septra [Sulfamethoxazole-Trimethoprim]     itch   Xeljanz [Tofacitinib] Hives   Zetia [Ezetimibe]     Stomach cramping   Hydrocodone Itching   Morphine And Related Itching   Penicillins Itching   Sulfa Antibiotics Itching    Current Problems (verified) Patient Active Problem List   Diagnosis Date Noted   CAD in native artery 12/03/2020   Carotid stenosis 10/25/2020   Insulin resistance 05/12/2020   Former smoker, 30 pack year, quit 2015 01/09/2020   Aortic atherosclerosis (HCC) by Chest CT scan  May 2021 01/08/2020   Hidradenitis suppurativa 05/16/2019   Morbid obesity (HCC)- BMI 35+ with htn, hld, CVD 11/08/2017   Abnormal glucose 08/02/2017   Cervical spondylosis with radiculopathy 05/30/2017   Rheumatoid arthritis involving multiple sites with positive rheumatoid factor (HCC) 11/14/2015   Sjogren's disease (HCC) 07/05/2014   Medication management 03/26/2014   Hyperlipidemia, mixed 09/24/2013    Vitamin D deficiency 09/24/2013   Intrinsic asthma    Essential hypertension    Hypothyroidism    Degeneration of intervertebral disc of lumbosacral region 05/23/2008    Screening Tests Immunization History  Administered Date(s) Administered   DTaP 09/15/2012, 09/15/2012   Influenza Inj Mdck Quad With Preservative 08/02/2017   Influenza Whole 09/06/2012   Influenza-Unspecified 05/07/2014, 05/20/2016, 08/02/2017   Pneumococcal Conjugate-13 07/05/2014   Pneumococcal Polysaccharide-23 03/02/2021   Zoster, Live 09/03/2013   Colonoscopy: 11/2021 -Dr. Russella Dar, 10 year follow up  PAP/pelvic: s/p hysterectomy age 62, Dr. Rosemary Holms follows annually MGM: 07/2021 DEXA: 2013, normal   Zostavax: reports had several years back, severe reaction, declines shingrix Covid 19: declines   Names of Other Physician/Practitioners you currently use: 1. [facility] here for primary care 2. [facility], eye doctor, last visit 2023 - mild cataracts 3. [facility], dentist, last visit 2022, goes q16m   Patient Care Team: Lucky Cowboy, MD as PCP - General (Internal Medicine) Meryl Dare, MD as Consulting Physician (Gastroenterology) Nadara Mustard, MD as Consulting Physician (Orthopedic Surgery) Lurena Nida, MD as Consulting Physician (Rheumatology) Aliene Beams, MD as Consulting Physician (Neurosurgery) Cherlyn Roberts, MD as Consulting Physician (Dermatology) Cherlyn Roberts, MD as Consulting Physician (Dermatology) Charlett Nose, [facility] as Pharmacist (Pharmacist)  SURGICAL HISTORY She  has a past surgical history that includes Thyroidectomy; Upper gastrointestinal endoscopy; Total knee arthroplasty; Vaginal hysterectomy; Knee surgery; Pilonidal cyst excision; Tonsillectomy; Anterior lat lumbar fusion (Left, 01/26/2013); Lumbar percutaneous pedicle screw 1 level (Left, 01/26/2013); Knee Arthroplasty (Left, 02/16/2016); and Anterior cervical  decomp/discectomy fusion (N/A, 05/30/2017). FAMILY HISTORY Her family history includes Alzheimer's disease in her paternal grandfather; Asthma in her brother; Breast cancer (age of onset: 57) in her maternal grandmother; Diabetes in her brother; Heart attack in her paternal grandfather; Heart attack (age of onset: 10) in her father; Hypertension in her father and maternal grandmother; Lung cancer (age of onset: 15) in her father; Other in her daughter; Stroke in her paternal grandmother; Stroke (age of onset: 51) in her father. SOCIAL HISTORY She  reports that she quit smoking about 6 years ago. Her smoking use included cigarettes. She has a 30.00 pack-year smoking history. She has never used smokeless tobacco. She reports current alcohol use. She reports that she does not use drugs.   MEDICARE WELLNESS OBJECTIVES: Physical activity: Current Exercise Habits: Home exercise routine, Type of exercise: walking;strength training/weights, Time (Minutes): 30, Frequency (Times/Week): 3, Weekly Exercise (Minutes/Week): 90, Intensity: Mild, Exercise limited by: orthopedic condition(s) Cardiac risk factors: Cardiac Risk Factors include: advanced age (>58men, >17 women);dyslipidemia;hypertension;obesity (BMI >30kg/m2);smoking/ tobacco exposure Depression/mood screen:      03/02/2022    4:39 PM  Depression screen PHQ 2/9  Decreased Interest 0  Down, Depressed, Hopeless 0  PHQ - 2 Score 0    ADLs:     03/02/2022    4:35 PM 06/07/2021    9:34 PM  In your present state of health, do you have any difficulty performing the following activities:  Hearing? 0 0  Vision? 0 0  Difficulty concentrating or making decisions? 0 0  Walking or climbing stairs? 0 0  Dressing or bathing? 0 0  Doing errands, shopping? 0 0     Cognitive Testing  Alert? Yes  Normal Appearance?Yes  Oriented to person? Yes  Place? Yes   Time? Yes  Recall of three objects?  Yes  Can perform simple calculations? Yes  Displays  appropriate judgment?Yes  Can read the correct time from a watch face?Yes  EOL planning: Does Patient Have a Medical Advance Directive?: No Would patient like information on creating a medical advance directive?: No - Patient declined  Review of Systems  Constitutional:  Negative for malaise/fatigue and weight loss.  HENT:  Negative for hearing loss and tinnitus.   Eyes:  Negative for blurred vision and double vision.  Respiratory:  Negative for cough, sputum production, shortness of breath and wheezing.   Cardiovascular:  Negative for chest pain, palpitations, orthopnea, claudication, leg swelling and PND.  Gastrointestinal:  Negative for abdominal pain, blood in stool, constipation, diarrhea, heartburn, melena, nausea and vomiting.  Genitourinary: Negative.   Musculoskeletal:  Positive for joint pain (wrists, follows rheumatology). Negative for falls and myalgias.  Skin:  Negative for rash.  Neurological:  Negative for dizziness, tingling, sensory change, weakness and headaches.  Endo/Heme/Allergies:  Positive for environmental allergies (mild). Negative for polydipsia.  Psychiatric/Behavioral: Negative.  Negative for depression, memory loss, substance abuse and suicidal ideas. The patient is not nervous/anxious and does not have insomnia.   All other systems reviewed and are negative.    Objective:     Today's Vitals   03/02/22 1558 03/02/22 1626  BP: 118/70   Pulse: (!) 129 74  Temp: 97.7 F (36.5 C)   SpO2: 99%   Weight: 202 lb (91.6 kg)    Body mass index is 36.95 kg/m.  General Appearance: Well nourished, in no apparent distress. Eyes: PERRLA, EOMs, conjunctiva no swelling or erythema Sinuses: No Frontal/maxillary tenderness ENT/Mouth: Ext aud canals clear, TMs without erythema, bulging. No erythema, swelling, or exudate on post pharynx.  Tonsils not swollen or erythematous. Hearing normal.  Neck: Supple, thyroid normal.  Respiratory: Respiratory effort normal, BS  equal bilaterally without rales, rhonchi, wheezing or stridor.  Cardio: RRR with no MRGs. Brisk peripheral pulses without edema.  Abdomen: Soft, + BS.  Non tender, no guarding, rebound, hernias, masses. Lymphatics: Non tender without lymphadenopathy.  Musculoskeletal: Full ROM, 5/5 strength, Mildly antalgic gait. R wrist with some warmth; non-erythematous, no obvious deformity Skin: Warm, dry; intact without  rashes, lesions, ecchynosis Neuro: Cranial nerves intact. No cerebellar symptoms.  Psych: Awake and oriented X 3, normal affect, Insight and Judgment appropriate.    Medicare Attestation I have personally reviewed: The patient's medical and social history Their use of alcohol, tobacco or illicit drugs Their current medications and supplements The patient's functional ability including ADLs,fall risks, home safety risks, cognitive, and hearing and visual impairment Diet and physical activities Evidence for depression or mood disorders  The patient's weight, height, BMI, and visual acuity have been recorded in the chart.  I have made referrals, counseling, and provided education to the patient based on review of the above and I have provided the patient with a written personalized care plan for preventive services.     Dan Maker, NP   03/02/2022

## 2022-03-03 ENCOUNTER — Other Ambulatory Visit: Payer: Self-pay | Admitting: Adult Health

## 2022-03-03 LAB — CBC WITH DIFFERENTIAL/PLATELET
Absolute Monocytes: 383 cells/uL (ref 200–950)
Basophils Absolute: 20 cells/uL (ref 0–200)
Basophils Relative: 0.3 %
Eosinophils Absolute: 20 cells/uL (ref 15–500)
Eosinophils Relative: 0.3 %
HCT: 38.8 % (ref 35.0–45.0)
Hemoglobin: 12.8 g/dL (ref 11.7–15.5)
Lymphs Abs: 3340 cells/uL (ref 850–3900)
MCH: 28.9 pg (ref 27.0–33.0)
MCHC: 33 g/dL (ref 32.0–36.0)
MCV: 87.6 fL (ref 80.0–100.0)
MPV: 9.8 fL (ref 7.5–12.5)
Monocytes Relative: 5.8 %
Neutro Abs: 2838 cells/uL (ref 1500–7800)
Neutrophils Relative %: 43 %
Platelets: 401 10*3/uL — ABNORMAL HIGH (ref 140–400)
RBC: 4.43 10*6/uL (ref 3.80–5.10)
RDW: 13.1 % (ref 11.0–15.0)
Total Lymphocyte: 50.6 %
WBC: 6.6 10*3/uL (ref 3.8–10.8)

## 2022-03-03 LAB — LIPID PANEL
Cholesterol: 196 mg/dL (ref <200)
HDL: 82 mg/dL (ref >=50)
LDL Cholesterol (Calc): 99 mg/dL (calc)
Non-HDL Cholesterol (Calc): 114 mg/dL (calc) (ref <130)
Total CHOL/HDL Ratio: 2.4 (calc) (ref <5.0)
Triglycerides: 65 mg/dL (ref <150)

## 2022-03-03 LAB — COMPLETE METABOLIC PANEL WITH GFR
AG Ratio: 1.5 (calc) (ref 1.0–2.5)
ALT: 25 U/L (ref 6–29)
AST: 23 U/L (ref 10–35)
Albumin: 4.5 g/dL (ref 3.6–5.1)
Alkaline phosphatase (APISO): 64 U/L (ref 37–153)
BUN: 12 mg/dL (ref 7–25)
CO2: 29 mmol/L (ref 20–32)
Calcium: 9.9 mg/dL (ref 8.6–10.4)
Chloride: 101 mmol/L (ref 98–110)
Creat: 0.62 mg/dL (ref 0.50–1.05)
Globulin: 3 g/dL (calc) (ref 1.9–3.7)
Glucose, Bld: 81 mg/dL (ref 65–99)
Potassium: 4.1 mmol/L (ref 3.5–5.3)
Sodium: 140 mmol/L (ref 135–146)
Total Bilirubin: 0.4 mg/dL (ref 0.2–1.2)
Total Protein: 7.5 g/dL (ref 6.1–8.1)
eGFR: 102 mL/min/{1.73_m2} (ref >=60)

## 2022-03-03 LAB — HEMOGLOBIN A1C
Hgb A1c MFr Bld: 5.4 % of total Hgb (ref <5.7)
Mean Plasma Glucose: 108 mg/dL
eAG (mmol/L): 6 mmol/L

## 2022-03-03 LAB — TSH: TSH: 0.31 mIU/L — ABNORMAL LOW (ref 0.40–4.50)

## 2022-03-03 LAB — MAGNESIUM: Magnesium: 2 mg/dL (ref 1.5–2.5)

## 2022-03-03 MED ORDER — LEVOTHYROXINE SODIUM 137 MCG PO TABS
137.0000 ug | ORAL_TABLET | Freq: Every day | ORAL | 3 refills | Status: DC
Start: 2022-03-03 — End: 2022-04-14

## 2022-03-18 ENCOUNTER — Telehealth: Payer: Self-pay

## 2022-03-18 NOTE — Telephone Encounter (Signed)
LM-03/18/22-Called pt. To confirm CP focused outreach on 7/18. Phone was answered, but no one spoke. Call was disconnected. Pt. Returned call, stated she could hear me. But I could not hear her. Confirned CP focuced outreach call for 7/18. Pt. Aware she will be called between 3-5pm.  Total time spent: 10 min.

## 2022-03-22 DIAGNOSIS — M0609 Rheumatoid arthritis without rheumatoid factor, multiple sites: Secondary | ICD-10-CM | POA: Diagnosis not present

## 2022-03-23 ENCOUNTER — Ambulatory Visit: Payer: Medicare Other | Admitting: Pharmacy Technician

## 2022-03-23 DIAGNOSIS — E039 Hypothyroidism, unspecified: Secondary | ICD-10-CM

## 2022-03-23 DIAGNOSIS — Z79899 Other long term (current) drug therapy: Secondary | ICD-10-CM

## 2022-03-23 DIAGNOSIS — M0579 Rheumatoid arthritis with rheumatoid factor of multiple sites without organ or systems involvement: Secondary | ICD-10-CM

## 2022-03-23 NOTE — Progress Notes (Signed)
Reached patient for Focused Pharmacist Outreach today. No complaints today other than fatigue after her new Simponi infusion yesterday. She states her arm has been flaring and the boils from her HS have started to return while off treatment. She is receiving a grant for [employer]. Her next infusion is in 4 weeks then will transition to 8 weeks. Her breathing is at baseline and plans to return to the [facility] for water aerobics next week. We discussed proper admin of Levothyroxine due to her levels being out of range. No biotin supplements. Nurse outreach in 1 month to check in on outcomes of Simponi. No copay barriers.   Marda Stalker, PharmD Clinical Pharmacist Naida [email or web address] [phone]   CPP Prep: 51mn CPP Televisit: 163m

## 2022-03-29 NOTE — Telephone Encounter (Signed)
Addendum 03/08/22-Chart Prep started, Reviewing OV, Consults, Hospital visits, Labs and medication changes.  Chart prep completed.  Total time spent: 30 min.

## 2022-03-31 ENCOUNTER — Other Ambulatory Visit: Payer: Self-pay | Admitting: Internal Medicine

## 2022-03-31 MED ORDER — FLUTICASONE FUROATE-VILANTEROL 100-25 MCG/ACT IN AEPB: INHALATION_SPRAY | RESPIRATORY_TRACT | 3 refills | Status: DC

## 2022-04-05 DIAGNOSIS — E039 Hypothyroidism, unspecified: Secondary | ICD-10-CM | POA: Diagnosis not present

## 2022-04-05 DIAGNOSIS — I10 Essential (primary) hypertension: Secondary | ICD-10-CM | POA: Diagnosis not present

## 2022-04-05 DIAGNOSIS — K2100 Gastro-esophageal reflux disease with esophagitis, without bleeding: Secondary | ICD-10-CM | POA: Diagnosis not present

## 2022-04-05 DIAGNOSIS — E782 Mixed hyperlipidemia: Secondary | ICD-10-CM | POA: Diagnosis not present

## 2022-04-07 ENCOUNTER — Encounter: Payer: Self-pay | Admitting: Physician Assistant

## 2022-04-07 ENCOUNTER — Ambulatory Visit: Payer: Medicare Other | Admitting: Physician Assistant

## 2022-04-07 VITALS — BP 110/58 | HR 93 | Ht 62.5 in | Wt 202.0 lb

## 2022-04-07 DIAGNOSIS — E782 Mixed hyperlipidemia: Secondary | ICD-10-CM

## 2022-04-07 DIAGNOSIS — I2510 Atherosclerotic heart disease of native coronary artery without angina pectoris: Secondary | ICD-10-CM | POA: Diagnosis not present

## 2022-04-07 DIAGNOSIS — I10 Essential (primary) hypertension: Secondary | ICD-10-CM

## 2022-04-07 NOTE — Progress Notes (Signed)
Cardiology Office Note:    Date:  04/07/2022   ID:  Autumn Frye, DOB February 28, 1961, MRN [id number]  PCP:  Unk Pinto, MD  [facility] Cardiologist:  Mertie Moores, MD  [facility] Electrophysiologist:  None   Chief Complaint: 6 months follow up   History of Present Illness:    Autumn Frye is a 61 y.o. female with a hx of elevated calcium score, HTN, RA and hyperlipidemia on Repatha and statin seen for follow-up.  Coronary calcium score 11/2020 ( [facility] facility )  with The total coronary calcium score is 139 which is between the 90th - 100th percentile for a female patient this age.  Then established care with Dr. Acie Fredrickson. Follow up stress test 01/2021 was low risk study.   Here today for follow up.  No complaints.  She does water aerobic exercise 1-2 times per week.  Other days to she walks and gets at least 8-10 K steps.  She denies chest pain, shortness of breath, orthopnea, PND, syncope, lower extremity edema or melena.  No exertional limitation.  She watches her diet.   Past Medical History:  Diagnosis Date   Arthritis    OA, RA   Asthma    with a cold   Autoimmune deficiency syndrome ([place])    Back pain    Cancer ([place])    thyroid-1995   Family history of adverse reaction to anesthesia    mother problems with n/v   Fibromyalgia    GERD (gastroesophageal reflux disease)    Hepatitis    'A" when pt. was 12 yrs. old   Hiatal hernia    Hyperlipidemia    Hypertension    Hypothyroidism    Pneumonia    S/P total knee arthroplasty 02/16/2016    Past Surgical History:  Procedure Laterality Date   ANTERIOR CERVICAL DECOMP/DISCECTOMY FUSION N/A 05/30/2017   Procedure: Cervical five-six, Cervical six-seven Anterior discectomy with fusion and plate fixation;  Surgeon: Ditty, Kevan Ny, MD;  Location: [place];  Service: Neurosurgery;  Laterality: N/A;   ANTERIOR LAT LUMBAR FUSION Left 01/26/2013   Procedure: ANTERIOR LATERAL LUMBAR FUSION 1 LEVEL;  Surgeon: Faythe Ghee, MD;  Location: [place] [facility];  Service: Neurosurgery;  Laterality: Left;  lumbar four-five   KNEE ARTHROPLASTY Left 02/16/2016   Procedure: COMPUTER ASSISTED TOTAL KNEE ARTHROPLASTY;  Surgeon: Dereck Leep, MD;  Location: [facility];  Service: Orthopedics;  Laterality: Left;   KNEE SURGERY     3 on right knee, 1 on left-prior to TKR   LUMBAR PERCUTANEOUS PEDICLE SCREW 1 LEVEL Left 01/26/2013   Procedure: LUMBAR PERCUTANEOUS PEDICLE SCREW 1 LEVEL;  Surgeon: Faythe Ghee, MD;  Location: [place] [facility];  Service: Neurosurgery;  Laterality: Left;  lumbar four-five   PILONIDAL CYST EXCISION     THYROIDECTOMY     TONSILLECTOMY     TOTAL KNEE ARTHROPLASTY     right   UPPER GASTROINTESTINAL ENDOSCOPY     VAGINAL HYSTERECTOMY      Current Medications: Current Meds  Medication Sig   acetaminophen (TYLENOL) 500 MG tablet Take 1 tablet (500 mg total) by mouth every 6 (six) hours as needed.   Acetylcysteine (N-ACETYL-L-CYSTEINE PO) Take 1 capsule by mouth daily.   albuterol (PROVENTIL) (2.5 MG/3ML) 0.083% nebulizer solution Take 3 mLs (2.5 mg total) by nebulization every 6 (six) hours as needed for wheezing or shortness of breath.   albuterol (VENTOLIN HFA) 108 (90 Base) MCG/ACT inhaler INHALE 2 PUFFS INTO THE  LUNGS EVERY 4 HOURS 15 MINUTES APART AS NEEDED FOR ASTHMA   amitriptyline (ELAVIL) 25 MG tablet Take 1 tablet every Morning & 3 tablets at Bedtime for Chronic Pain   ASPIRIN 81 PO Take by mouth daily.   BELBUCA 150 MCG FILM SMARTSIG:1 Strip(s) By Mouth Every 12 Hours   Black Pepper-Turmeric (TURMERIC CURCUMIN) 01-999 MG CAPS Takes 1 capsule  Daily   Cholecalciferol (VITAMIN D3) 5000 UNITS TABS Take 10,000 Units by mouth daily.    CRANBERRY PO Take 1 tablet by mouth 2 (two) times daily.   diclofenac Sodium (VOLTAREN) 1 % GEL Apply 2 g topically as needed (inflamation).   doxycycline (VIBRAMYCIN) 50 MG capsule Take 1 capsule (50 mg total) by mouth daily.   Evolocumab (REPATHA SURECLICK)  786 MG/ML SOAJ Inject 1 mL into the skin every 14 (fourteen) days.   Eyelid Cleansers (AVENOVA) 0.01 % SOLN Place 1 application into both eyes 2 (two) times daily. Apply to eyelids BID   fexofenadine (ALLEGRA) 180 MG tablet Take 180 mg by mouth daily as needed.   fluticasone furoate-vilanterol (BREO ELLIPTA) 100-25 MCG/ACT AEPB Use  1 Inhalation Daily   furosemide (LASIX) 40 MG tablet Take 1 tablet (40 mg total) by mouth once a week.   Golimumab (SIMPONI ARIA IV) Inject 2 mg/kg into the vein every 8 (eight) weeks.   Levomefolate Glucosamine (METHYLFOLATE PO) Take 5,000 mg by mouth daily.   levothyroxine (SYNTHROID) 137 MCG tablet Take 1 tablet (137 mcg total) by mouth daily before breakfast. Take 1 tablet by mouth daily except Wednesday/Saturday take 1/2 tablet   liothyronine (CYTOMEL) 5 MCG tablet Take 5 mcg by mouth daily.   LOTEMAX 0.5 % GEL INSTILL 1 DROP IN BOTH EYES THREE TIMES DAILY   Magnesium Oxide 500 MG TABS Take 1,000 mg by mouth daily.   MILK THISTLE EXTRACT PO Take 2 capsules by mouth daily.   montelukast (SINGULAIR) 10 MG tablet TAKE 1 TABLET BY MOUTH DAILY FOR ALLERGIES   mupirocin ointment (BACTROBAN) 2 % APPLY EXTERNALLY TO THE AFFECTED AREA THREE TIMES DAILY FOR 10 DAYS   olmesartan (BENICAR) 20 MG tablet TAKE 1 TABLET BY MOUTH DAILY FOR BLOOD PRESSURE   Omega-3 Fatty Acids (FISH OIL) 1200 MG CAPS Take 2 capsules by mouth daily.    Polyethyl Glycol-Propyl Glycol 0.4-0.3 % SOLN Place 1 drop into both eyes 4 (four) times daily.    RESTASIS 0.05 % ophthalmic emulsion Place 1 drop into both eyes daily.    rosuvastatin (CRESTOR) 40 MG tablet TAKE 1 TABLET BY MOUTH DAILY FOR CHOLESTEROL   triamcinolone (NASACORT) 55 MCG/ACT AERO nasal inhaler Place 1 spray into the nose 2 (two) times daily.   vitamin B-12 (CYANOCOBALAMIN) 1000 MCG tablet Take 1,000 mcg by mouth daily.     Allergies:   Ciprofloxacin, Levofloxacin, Lyrica [pregabalin], Compazine [prochlorperazine edisylate],  Gabapentin, Sulfamethoxazole-trimethoprim, Xeljanz [tofacitinib], Zetia [ezetimibe], Hydrocodone, Morphine and related, Penicillins, and Sulfa antibiotics   Social History   Socioeconomic History   Marital status: Married    Spouse name: Not on file   Number of children: Not on file   Years of education: Not on file   Highest education level: Not on file  Occupational History   Not on file  Tobacco Use   Smoking status: Former    Packs/day: 0.75    Years: 40.00    Total pack years: 30.00    Types: Cigarettes    Quit date: 07/05/2015    Years since quitting: 6.7  Smokeless tobacco: Never  Vaping Use   Vaping Use: Never used  Substance and Sexual Activity   Alcohol use: Yes    Alcohol/week: 0.0 standard drinks of alcohol    Comment: rare   Drug use: No   Sexual activity: Not on file  Other Topics Concern   Not on file  Social History Narrative   Not on file   Social Determinants of Health   Financial Resource Strain: Not on file  Food Insecurity: No Food Insecurity (11/04/2021)   Hunger Vital Sign    Worried About Running Out of Food in the Last Year: Never true    Ran Out of Food in the Last Year: Never true  Transportation Needs: No Transportation Needs (11/04/2021)   PRAPARE - Hydrologist (Medical): No    Lack of Transportation (Non-Medical): No  Physical Activity: Not on file  Stress: Not on file  Social Connections: Not on file     Family History: The patient's family history includes Alzheimer's disease in her paternal grandfather; Asthma in her brother; Breast cancer (age of onset: 2) in her maternal grandmother; Diabetes in her brother; Heart attack in her paternal grandfather; Heart attack (age of onset: 9) in her father; Hypertension in her father and maternal grandmother; Lung cancer (age of onset: 65) in her father; Other in her daughter; Stroke in her paternal grandmother; Stroke (age of onset: 7) in her father. There is no  history of Colon cancer, Esophageal cancer, Stomach cancer, or Rectal cancer.    ROS:   Please see the history of present illness.    All other systems reviewed and are negative.   EKGs/Labs/Other Studies Reviewed:    The following studies were reviewed today:  Stress test 01/2021 There was no ST segment deviation noted during stress. The left ventricular ejection fraction is hyperdynamic (>65%). Nuclear stress EF: 66%. The study is normal. This is a low risk study.   1. Fixed perfusion defect at apex with normal wall motion, suggests artifact 2. Low risk study    CT cardiac scoring 11/2020 FINDINGS:  TOTAL CALCIUM SCORE :   139   LEFT MAIN:0   LAD:50   CIRCUMFLEX: 32   RCA: 45   PDA: 0   CARDIAC ANATOMY:Normal. Small pericardial effusion noted.   VISUALIZED THORAX:Spinal neurostimulator present.   IMPRESSION: The total coronary calcium score is 139 which is between the 90th and 100th percentile for a female patient this age.   EKG:  EKG is not ordered today.    Recent Labs: 03/02/2022: ALT 25; BUN 12; Creat 0.62; Hemoglobin 12.8; Magnesium 2.0; Platelets 401; Potassium 4.1; Sodium 140; TSH 0.31  Recent Lipid Panel    Component Value Date/Time   CHOL 196 03/02/2022 1707   CHOL 179 04/08/2021 1236   TRIG 65 03/02/2022 1707   HDL 82 03/02/2022 1707   HDL 105 04/08/2021 1236   CHOLHDL 2.4 03/02/2022 1707   VLDL 19 01/28/2017 0930   LDLCALC 99 03/02/2022 1707   Physical Exam:    VS:  BP (!) 110/58   Pulse 93   Ht 5' 2.5" (1.588 m)   Wt 202 lb (91.6 kg)   LMP  (LMP Unknown)   SpO2 97%   BMI 36.36 kg/m     Wt Readings from Last 3 Encounters:  04/07/22 202 lb (91.6 kg)  03/02/22 202 lb (91.6 kg)  02/26/22 201 lb (91.2 kg)     GEN:  Well nourished, well developed  in no acute distress HEENT: Normal NECK: No JVD; No carotid bruits LYMPHATICS: No lymphadenopathy CARDIAC: RRR, no murmurs, rubs, gallops RESPIRATORY:  Clear to auscultation without rales,  wheezing or rhonchi  ABDOMEN: Soft, non-tender, non-distended MUSCULOSKELETAL:  No edema; No deformity  SKIN: Warm and dry NEUROLOGIC:  Alert and oriented x 3 PSYCHIATRIC:  Normal affect   ASSESSMENT AND PLAN:    CAD Elevated calcium scoring with reassuring follow-up stress test as above.  Continue exercise regimen.  Continue aspirin and statin.  2.  Hyperlipidemia 03/02/2022: Cholesterol 196; HDL 82; LDL Cholesterol (Calc) 99; Triglycerides 65  -Continue Repatha and Crestor  3.  Hypertension -Blood pressure controlled on current medication.  Medication Adjustments/Labs and Tests Ordered: Current medicines are reviewed at length with the patient today.  Concerns regarding medicines are outlined above.  No orders of the defined types were placed in this encounter.  No orders of the defined types were placed in this encounter.   Patient Instructions  Medication Instructions:  Your physician recommends that you continue on your current medications as directed. Please refer to the Current Medication list given to you today. *If you need a refill on your cardiac medications before your next appointment, please call your pharmacy*   Lab Work: NONE ORDERED   Testing/Procedures: NONE ORDERED   Follow-Up: At [facility], you and your health needs are our priority.  As part of our continuing mission to provide you with exceptional heart care, we have created designated Provider Care Teams.  These Care Teams include your primary Cardiologist (physician) and Advanced Practice Providers (APPs -  Physician Assistants and Nurse Practitioners) who all work together to provide you with the care you need, when you need it.  We recommend signing up for the patient portal called "MyChart".  Sign up information is provided on this After Visit Summary.  MyChart is used to connect with patients for Virtual Visits (Telemedicine).  Patients are able to view lab/test results, encounter notes,  upcoming appointments, etc.  Non-urgent messages can be sent to your provider as well.   To learn more about what you can do with MyChart, go to [email or web address].    Your next appointment:   1 year(s)  The format for your next appointment:   In Person  Provider:   Mertie Moores, MD     Other Instructions   Important Information About Sugar         Jarrett Soho, [place]  04/07/2022 11:32 AM    [place]

## 2022-04-07 NOTE — Patient Instructions (Signed)
Medication Instructions:  Your physician recommends that you continue on your current medications as directed. Please refer to the Current Medication list given to you today. *If you need a refill on your cardiac medications before your next appointment, please call your pharmacy*   Lab Work: NONE ORDERED   Testing/Procedures: NONE ORDERED   Follow-Up: At [facility], you and your health needs are our priority.  As part of our continuing mission to provide you with exceptional heart care, we have created designated Provider Care Teams.  These Care Teams include your primary Cardiologist (physician) and Advanced Practice Providers (APPs -  Physician Assistants and Nurse Practitioners) who all work together to provide you with the care you need, when you need it.  We recommend signing up for the patient portal called "MyChart".  Sign up information is provided on this After Visit Summary.  MyChart is used to connect with patients for Virtual Visits (Telemedicine).  Patients are able to view lab/test results, encounter notes, upcoming appointments, etc.  Non-urgent messages can be sent to your provider as well.   To learn more about what you can do with MyChart, go to [email or web address].    Your next appointment:   1 year(s)  The format for your next appointment:   In Person  Provider:   Mertie Moores, MD     Other Instructions   Important Information About Sugar

## 2022-04-13 ENCOUNTER — Ambulatory Visit: Payer: Medicare Other

## 2022-04-13 VITALS — BP 110/58 | HR 78 | Temp 97.9°F | Resp 16 | Ht 62.5 in | Wt 202.3 lb

## 2022-04-13 DIAGNOSIS — E039 Hypothyroidism, unspecified: Secondary | ICD-10-CM

## 2022-04-13 NOTE — Progress Notes (Signed)
The patient comes in for repeat TSH today. She voices no new concerns or issues at today's nurses visit.

## 2022-04-14 ENCOUNTER — Other Ambulatory Visit: Payer: Self-pay | Admitting: Internal Medicine

## 2022-04-14 DIAGNOSIS — M25652 Stiffness of left hip, not elsewhere classified: Secondary | ICD-10-CM | POA: Diagnosis not present

## 2022-04-14 DIAGNOSIS — M6281 Muscle weakness (generalized): Secondary | ICD-10-CM | POA: Diagnosis not present

## 2022-04-14 DIAGNOSIS — M25651 Stiffness of right hip, not elsewhere classified: Secondary | ICD-10-CM | POA: Diagnosis not present

## 2022-04-14 DIAGNOSIS — M25551 Pain in right hip: Secondary | ICD-10-CM | POA: Diagnosis not present

## 2022-04-14 DIAGNOSIS — M25552 Pain in left hip: Secondary | ICD-10-CM | POA: Diagnosis not present

## 2022-04-14 LAB — TSH: TSH: 0.25 mIU/L — ABNORMAL LOW (ref 0.40–4.50)

## 2022-04-14 MED ORDER — LEVOTHYROXINE SODIUM 100 MCG PO TABS
ORAL_TABLET | ORAL | 1 refills | Status: DC
Start: 2022-04-14 — End: 2022-10-15

## 2022-04-14 NOTE — Progress Notes (Signed)
<><><><><><><><><><><><><><><><><><><><><><><><><><><><><><><><><> <><><><><><><><><><><><><><><><><><><><><><><><><><><><><><><><><> -   Test results slightly outside the reference range are not unusual. If there is anything important, I will review this with you,  otherwise it is considered normal test values.  If you have further questions,  please do not hesitate to contact me at the office or via My Chart.  <><><><><><><><><><><><><><><><><><><><><><><><><><><><><><><><><> <><><><><><><><><><><><><><><><><><><><><><><><><><><><><><><><><>  -  TSH still very low which means thyroid hormone level way too high in blood , So  - New Rx sent for levothyroxine 100 mcg to take 1 whole tablet every day   - then will recheck at your Physical on 10 / 03

## 2022-04-19 DIAGNOSIS — M0609 Rheumatoid arthritis without rheumatoid factor, multiple sites: Secondary | ICD-10-CM | POA: Diagnosis not present

## 2022-04-21 DIAGNOSIS — M25651 Stiffness of right hip, not elsewhere classified: Secondary | ICD-10-CM | POA: Diagnosis not present

## 2022-04-21 DIAGNOSIS — M25551 Pain in right hip: Secondary | ICD-10-CM | POA: Diagnosis not present

## 2022-04-21 DIAGNOSIS — M25652 Stiffness of left hip, not elsewhere classified: Secondary | ICD-10-CM | POA: Diagnosis not present

## 2022-04-21 DIAGNOSIS — M25552 Pain in left hip: Secondary | ICD-10-CM | POA: Diagnosis not present

## 2022-04-21 DIAGNOSIS — M6281 Muscle weakness (generalized): Secondary | ICD-10-CM | POA: Diagnosis not present

## 2022-04-24 ENCOUNTER — Other Ambulatory Visit: Payer: Self-pay | Admitting: Nurse Practitioner

## 2022-04-24 DIAGNOSIS — E782 Mixed hyperlipidemia: Secondary | ICD-10-CM

## 2022-04-27 ENCOUNTER — Telehealth: Payer: Self-pay

## 2022-04-27 DIAGNOSIS — M6281 Muscle weakness (generalized): Secondary | ICD-10-CM | POA: Diagnosis not present

## 2022-04-27 DIAGNOSIS — M25552 Pain in left hip: Secondary | ICD-10-CM | POA: Diagnosis not present

## 2022-04-27 DIAGNOSIS — M25651 Stiffness of right hip, not elsewhere classified: Secondary | ICD-10-CM | POA: Diagnosis not present

## 2022-04-27 DIAGNOSIS — M25652 Stiffness of left hip, not elsewhere classified: Secondary | ICD-10-CM | POA: Diagnosis not present

## 2022-04-27 DIAGNOSIS — M25551 Pain in right hip: Secondary | ICD-10-CM | POA: Diagnosis not present

## 2022-04-27 NOTE — Telephone Encounter (Signed)
LM-04/27/22-Called pt. and asked how she has been feeling since her Simponi infusions. Pt. stated she feels significant improvement in her joints and feels a lot better. Pt. noted she's still having slight fatigue, but stated it's a lot better then she was when she last spoke to CP.Marland Kitchen Advised pt. call if fatigue persists or any health concerns/questions come up. Pt. verbalized understanding and agreed,    Total time spent: 7 min.

## 2022-04-30 DIAGNOSIS — M6281 Muscle weakness (generalized): Secondary | ICD-10-CM | POA: Diagnosis not present

## 2022-04-30 DIAGNOSIS — M25652 Stiffness of left hip, not elsewhere classified: Secondary | ICD-10-CM | POA: Diagnosis not present

## 2022-04-30 DIAGNOSIS — M25552 Pain in left hip: Secondary | ICD-10-CM | POA: Diagnosis not present

## 2022-04-30 DIAGNOSIS — M25651 Stiffness of right hip, not elsewhere classified: Secondary | ICD-10-CM | POA: Diagnosis not present

## 2022-04-30 DIAGNOSIS — M25551 Pain in right hip: Secondary | ICD-10-CM | POA: Diagnosis not present

## 2022-05-03 DIAGNOSIS — M25652 Stiffness of left hip, not elsewhere classified: Secondary | ICD-10-CM | POA: Diagnosis not present

## 2022-05-03 DIAGNOSIS — M25552 Pain in left hip: Secondary | ICD-10-CM | POA: Diagnosis not present

## 2022-05-03 DIAGNOSIS — M25651 Stiffness of right hip, not elsewhere classified: Secondary | ICD-10-CM | POA: Diagnosis not present

## 2022-05-03 DIAGNOSIS — M6281 Muscle weakness (generalized): Secondary | ICD-10-CM | POA: Diagnosis not present

## 2022-05-03 DIAGNOSIS — M25551 Pain in right hip: Secondary | ICD-10-CM | POA: Diagnosis not present

## 2022-05-05 DIAGNOSIS — M25651 Stiffness of right hip, not elsewhere classified: Secondary | ICD-10-CM | POA: Diagnosis not present

## 2022-05-05 DIAGNOSIS — M25652 Stiffness of left hip, not elsewhere classified: Secondary | ICD-10-CM | POA: Diagnosis not present

## 2022-05-05 DIAGNOSIS — M6281 Muscle weakness (generalized): Secondary | ICD-10-CM | POA: Diagnosis not present

## 2022-05-05 DIAGNOSIS — M25552 Pain in left hip: Secondary | ICD-10-CM | POA: Diagnosis not present

## 2022-05-05 DIAGNOSIS — M25551 Pain in right hip: Secondary | ICD-10-CM | POA: Diagnosis not present

## 2022-05-11 DIAGNOSIS — M6281 Muscle weakness (generalized): Secondary | ICD-10-CM | POA: Diagnosis not present

## 2022-05-11 DIAGNOSIS — M25651 Stiffness of right hip, not elsewhere classified: Secondary | ICD-10-CM | POA: Diagnosis not present

## 2022-05-11 DIAGNOSIS — M25551 Pain in right hip: Secondary | ICD-10-CM | POA: Diagnosis not present

## 2022-05-11 DIAGNOSIS — M25552 Pain in left hip: Secondary | ICD-10-CM | POA: Diagnosis not present

## 2022-05-11 DIAGNOSIS — M25652 Stiffness of left hip, not elsewhere classified: Secondary | ICD-10-CM | POA: Diagnosis not present

## 2022-05-18 DIAGNOSIS — M25551 Pain in right hip: Secondary | ICD-10-CM | POA: Diagnosis not present

## 2022-05-18 DIAGNOSIS — M25552 Pain in left hip: Secondary | ICD-10-CM | POA: Diagnosis not present

## 2022-05-18 DIAGNOSIS — M25651 Stiffness of right hip, not elsewhere classified: Secondary | ICD-10-CM | POA: Diagnosis not present

## 2022-05-18 DIAGNOSIS — M25652 Stiffness of left hip, not elsewhere classified: Secondary | ICD-10-CM | POA: Diagnosis not present

## 2022-05-18 DIAGNOSIS — M6281 Muscle weakness (generalized): Secondary | ICD-10-CM | POA: Diagnosis not present

## 2022-05-21 DIAGNOSIS — M25652 Stiffness of left hip, not elsewhere classified: Secondary | ICD-10-CM | POA: Diagnosis not present

## 2022-05-21 DIAGNOSIS — M25551 Pain in right hip: Secondary | ICD-10-CM | POA: Diagnosis not present

## 2022-05-21 DIAGNOSIS — M25552 Pain in left hip: Secondary | ICD-10-CM | POA: Diagnosis not present

## 2022-05-21 DIAGNOSIS — M6281 Muscle weakness (generalized): Secondary | ICD-10-CM | POA: Diagnosis not present

## 2022-05-21 DIAGNOSIS — M25651 Stiffness of right hip, not elsewhere classified: Secondary | ICD-10-CM | POA: Diagnosis not present

## 2022-05-24 DIAGNOSIS — M25551 Pain in right hip: Secondary | ICD-10-CM | POA: Diagnosis not present

## 2022-05-24 DIAGNOSIS — M6281 Muscle weakness (generalized): Secondary | ICD-10-CM | POA: Diagnosis not present

## 2022-05-24 DIAGNOSIS — M25552 Pain in left hip: Secondary | ICD-10-CM | POA: Diagnosis not present

## 2022-05-24 DIAGNOSIS — M25652 Stiffness of left hip, not elsewhere classified: Secondary | ICD-10-CM | POA: Diagnosis not present

## 2022-05-24 DIAGNOSIS — M25651 Stiffness of right hip, not elsewhere classified: Secondary | ICD-10-CM | POA: Diagnosis not present

## 2022-05-26 DIAGNOSIS — M25551 Pain in right hip: Secondary | ICD-10-CM | POA: Diagnosis not present

## 2022-05-26 DIAGNOSIS — M6281 Muscle weakness (generalized): Secondary | ICD-10-CM | POA: Diagnosis not present

## 2022-05-26 DIAGNOSIS — M25652 Stiffness of left hip, not elsewhere classified: Secondary | ICD-10-CM | POA: Diagnosis not present

## 2022-05-26 DIAGNOSIS — M25651 Stiffness of right hip, not elsewhere classified: Secondary | ICD-10-CM | POA: Diagnosis not present

## 2022-05-26 DIAGNOSIS — M25552 Pain in left hip: Secondary | ICD-10-CM | POA: Diagnosis not present

## 2022-05-31 DIAGNOSIS — M25652 Stiffness of left hip, not elsewhere classified: Secondary | ICD-10-CM | POA: Diagnosis not present

## 2022-05-31 DIAGNOSIS — M25552 Pain in left hip: Secondary | ICD-10-CM | POA: Diagnosis not present

## 2022-05-31 DIAGNOSIS — M25651 Stiffness of right hip, not elsewhere classified: Secondary | ICD-10-CM | POA: Diagnosis not present

## 2022-05-31 DIAGNOSIS — M6281 Muscle weakness (generalized): Secondary | ICD-10-CM | POA: Diagnosis not present

## 2022-05-31 DIAGNOSIS — M25551 Pain in right hip: Secondary | ICD-10-CM | POA: Diagnosis not present

## 2022-06-06 ENCOUNTER — Encounter: Payer: Self-pay | Admitting: Internal Medicine

## 2022-06-06 NOTE — Patient Instructions (Signed)
Due to recent changes in healthcare laws, you may see the results of your imaging and laboratory studies on MyChart before your provider has had a chance to review them.  We understand that in some cases there may be results that are confusing or concerning to you. Not all laboratory results come back in the same time frame and the provider may be waiting for multiple results in order to interpret others.  Please give [place] 48 hours in order for your provider to thoroughly review all the results before contacting the office for clarification of your results.   ++++++++++++++++++++++++++++++  Vit D  & Vit C 1,000 mg   are recommended to help protect  against the Covid-19 and other Corona viruses.    Also it's recommended  to take  Zinc 50 mg  to help  protect against the Covid-19   and best place to get  is also on [email or web address]  and don't pay more than 6-8 cents /pill !  ================================ Coronavirus (COVID-19) Are you at risk?  Are you at risk for the Coronavirus (COVID-19)?  To be considered HIGH RISK for Coronavirus (COVID-19), you have to meet the following criteria:  Traveled to [place], [place], [place], [place] or [place]; or in the [place] to [place], [place], [place]  or [place]; and have fever, cough, and shortness of breath within the last 2 weeks of travel OR Been in close contact with a person diagnosed with COVID-19 within the last 2 weeks and have  fever, cough,and shortness of breath  IF YOU DO NOT MEET THESE CRITERIA, YOU ARE CONSIDERED LOW RISK FOR COVID-19.  What to do if you are HIGH RISK for COVID-19?  If you are having a medical emergency, call 911. Seek medical care right away. Before you go to a doctor's office, urgent care or emergency department,  call ahead and tell them about your recent travel, contact with someone diagnosed with COVID-19   and your symptoms.  You should receive instructions from your physician's office regarding  next steps of care.  When you arrive at healthcare provider, tell the healthcare staff immediately you have returned from  visiting [place], [place], [place], [place] or [place]; or traveled in the [place] to [place], [place],  [place] or [place] in the last two weeks or you have been in close contact with a person diagnosed with  COVID-19 in the last 2 weeks.   Tell the health care staff about your symptoms: fever, cough and shortness of breath. After you have been seen by a medical provider, you will be either: Tested for (COVID-19) and discharged home on quarantine except to seek medical care if  symptoms worsen, and asked to  Stay home and avoid contact with others until you get your results (4-5 days)  Avoid travel on public transportation if possible (such as bus, train, or airplane) or Sent to the Emergency Department by EMS for evaluation, COVID-19 testing  and  possible admission depending on your condition and test results.  What to do if you are LOW RISK for COVID-19?  Reduce your risk of any infection by using the same precautions used for avoiding the common cold or flu:  Wash your hands often with soap and warm water for at least 20 seconds.  If soap and water are not readily available,  use an alcohol-based hand sanitizer with at least 60% alcohol.  If coughing or sneezing, cover your mouth and nose by coughing  or sneezing into the elbow areas of your shirt or coat,  into a tissue or into your sleeve (not your hands). Avoid shaking hands with others and consider head nods or verbal greetings only. Avoid touching your eyes, nose, or mouth with unwashed hands.  Avoid close contact with people who are sick. Avoid places or events with large numbers of people in one location, like concerts or sporting events. Carefully consider travel plans you have or are making. If you are planning any travel outside or inside the [place], visit the CDC's Travelers' Health webpage for  the latest health notices. If you have some symptoms but not all symptoms, continue to monitor at home and seek medical attention  if your symptoms worsen. If you are having a medical emergency, call 911. >>>>>>>>>>>>>>>>>>>>>>> Preventive Care for Adults  A healthy lifestyle and preventive care can promote health and wellness. Preventive health guidelines for women include the following key practices. A routine yearly physical is a good way to check with your health care provider about your health and preventive screening. It is a chance to share any concerns and updates on your health and to receive a thorough exam. Visit your dentist for a routine exam and preventive care every 6 months. Brush your teeth twice a day and floss once a day. Good oral hygiene prevents tooth decay and gum disease. The frequency of eye exams is based on your age, health, family medical history, use of contact lenses, and other factors. Follow your health care provider's recommendations for frequency of eye exams. Eat a healthy diet. Foods like vegetables, fruits, whole grains, low-fat dairy products, and lean protein foods contain the nutrients you need without too many calories. Decrease your intake of foods high in solid fats, added sugars, and salt. Eat the right amount of calories for you. Get information about a proper diet from your health care provider, if necessary. Regular physical exercise is one of the most important things you can do for your health. Most adults should get at least 150 minutes of moderate-intensity exercise (any activity that increases your heart rate and causes you to sweat) each week. In addition, most adults need muscle-strengthening exercises on 2 or more days a week. Maintain a healthy weight. The body mass index (BMI) is a screening tool to identify possible weight problems. It provides an estimate of body fat based on height and weight. Your health care provider can find your BMI and can  help you achieve or maintain a healthy weight. For adults 20 years and older: A BMI below 18.5 is considered underweight. A BMI of 18.5 to 24.9 is normal. A BMI of 25 to 29.9 is considered overweight. A BMI of 30 and above is considered obese. Maintain normal blood lipids and cholesterol levels by exercising and minimizing your intake of saturated fat. Eat a balanced diet with plenty of fruit and vegetables. Blood tests for lipids and cholesterol should begin at age 43 and be repeated every 5 years. If your lipid or cholesterol levels are high, you are over 50, or you are at high risk for heart disease, you may need your cholesterol levels checked more frequently. Ongoing high lipid and cholesterol levels should be treated with medicines if diet and exercise are not working. If you smoke, find out from your health care provider how to quit. If you do not use tobacco, do not start. Lung cancer screening is recommended for adults aged 11-80 years who are at high risk for developing  lung cancer because of a history of smoking. A yearly low-dose CT scan of the lungs is recommended for people who have at least a 30-pack-year history of smoking and are a current smoker or have quit within the past 15 years. A pack year of smoking is smoking an average of 1 pack of cigarettes a day for 1 year (for example: 1 pack a day for 30 years or 2 packs a day for 15 years). Yearly screening should continue until the smoker has stopped smoking for at least 15 years. Yearly screening should be stopped for people who develop a health problem that would prevent them from having lung cancer treatment. High blood pressure causes heart disease and increases the risk of stroke. Your blood pressure should be checked at least every 1 to 2 years. Ongoing high blood pressure should be treated with medicines if weight loss and exercise do not work. If you are 32-3 years old, ask your health care provider if you should take aspirin to  prevent strokes. Diabetes screening involves taking a blood sample to check your fasting blood sugar level. This should be done once every 3 years, after age 32, if you are within normal weight and without risk factors for diabetes. Testing should be considered at a younger age or be carried out more frequently if you are overweight and have at least 1 risk factor for diabetes. Breast cancer screening is essential preventive care for women. You should practice "breast self-awareness." This means understanding the normal appearance and feel of your breasts and may include breast self-examination. Any changes detected, no matter how small, should be reported to a health care provider. Women in their 45s and 30s should have a clinical breast exam (CBE) by a health care provider as part of a regular health exam every 1 to 3 years. After age 83, women should have a CBE every year. Starting at age 92, women should consider having a mammogram (breast X-ray test) every year. Women who have a family history of breast cancer should talk to their health care provider about genetic screening. Women at a high risk of breast cancer should talk to their health care providers about having an MRI and a mammogram every year. Breast cancer gene (BRCA)-related cancer risk assessment is recommended for women who have family members with BRCA-related cancers. BRCA-related cancers include breast, ovarian, tubal, and peritoneal cancers. Having family members with these cancers may be associated with an increased risk for harmful changes (mutations) in the breast cancer genes BRCA1 and BRCA2. Results of the assessment will determine the need for genetic counseling and BRCA1 and BRCA2 testing. Routine pelvic exams to screen for cancer are no longer recommended for nonpregnant women who are considered low risk for cancer of the pelvic organs (ovaries, uterus, and vagina) and who do not have symptoms. Ask your health care provider if a  screening pelvic exam is right for you. If you have had past treatment for cervical cancer or a condition that could lead to cancer, you need Pap tests and screening for cancer for at least 20 years after your treatment. If Pap tests have been discontinued, your risk factors (such as having a new sexual partner) need to be reassessed to determine if screening should be resumed. Some women have medical problems that increase the chance of getting cervical cancer. In these cases, your health care provider may recommend more frequent screening and Pap tests. Colorectal cancer can be detected and often prevented. Most routine colorectal  cancer screening begins at the age of 10 years and continues through age 19 years. However, your health care provider may recommend screening at an earlier age if you have risk factors for colon cancer. On a yearly basis, your health care provider may provide home test kits to check for hidden blood in the stool. Use of a small camera at the end of a tube, to directly examine the colon (sigmoidoscopy or colonoscopy), can detect the earliest forms of colorectal cancer. Talk to your health care provider about this at age 28, when routine screening begins.  Direct exam of the colon should be repeated every 5-10 years through age 71 years, unless early forms of pre-cancerous polyps or small growths are found. Hepatitis C blood testing is recommended for all people born from [birth year] through 1965 and any individual with known risks for hepatitis C.  Osteoporosis is a disease in which the bones lose minerals and strength with aging. This can result in serious bone fractures or breaks. The risk of osteoporosis can be identified using a bone density scan. Women ages 21 years and over and women at risk for fractures or osteoporosis should discuss screening with their health care providers. Ask your health care provider whether you should take a calcium supplement or vitamin D to reduce the rate  of osteoporosis. Menopause can be associated with physical symptoms and risks. Hormone replacement therapy is available to decrease symptoms and risks. You should talk to your health care provider about whether hormone replacement therapy is right for you. Use sunscreen. Apply sunscreen liberally and repeatedly throughout the day. You should seek shade when your shadow is shorter than you. Protect yourself by wearing long sleeves, pants, a wide-brimmed hat, and sunglasses year round, whenever you are outdoors. Once a month, do a whole body skin exam, using a mirror to look at the skin on your back. Tell your health care provider of new moles, moles that have irregular borders, moles that are larger than a pencil eraser, or moles that have changed in shape or color. Stay current with required vaccines (immunizations). Influenza vaccine. All adults should be immunized every year. Tetanus, diphtheria, and acellular pertussis (Td, Tdap) vaccine. Pregnant women should receive 1 dose of Tdap vaccine during each pregnancy. The dose should be obtained regardless of the length of time since the last dose. Immunization is preferred during the 27th-36th week of gestation. An adult who has not previously received Tdap or who does not know her vaccine status should receive 1 dose of Tdap. This initial dose should be followed by tetanus and diphtheria toxoids (Td) booster doses every 10 years. Adults with an unknown or incomplete history of completing a 3-dose immunization series with Td-containing vaccines should begin or complete a primary immunization series including a Tdap dose. Adults should receive a Td booster every 10 years. Varicella vaccine. An adult without evidence of immunity to varicella should receive 2 doses or a second dose if she has previously received 1 dose. Pregnant females who do not have evidence of immunity should receive the first dose after pregnancy. This first dose should be obtained before  leaving the health care facility. The second dose should be obtained 4-8 weeks after the first dose. Human papillomavirus (HPV) vaccine. Females aged 13-26 years who have not received the vaccine previously should obtain the 3-dose series. The vaccine is not recommended for use in pregnant females. However, pregnancy testing is not needed before receiving a dose. If a female is found to  be pregnant after receiving a dose, no treatment is needed. In that case, the remaining doses should be delayed until after the pregnancy. Immunization is recommended for any person with an immunocompromised condition through the age of 55 years if she did not get any or all doses earlier. During the 3-dose series, the second dose should be obtained 4-8 weeks after the first dose. The third dose should be obtained 24 weeks after the first dose and 16 weeks after the second dose. Zoster vaccine. One dose is recommended for adults aged 15 years or older unless certain conditions are present. Measles, mumps, and rubella (MMR) vaccine. Adults born before 20 generally are considered immune to measles and mumps. Adults born in [birth year] or later should have 1 or more doses of MMR vaccine unless there is a contraindication to the vaccine or there is laboratory evidence of immunity to each of the three diseases. A routine second dose of MMR vaccine should be obtained at least 28 days after the first dose for students attending postsecondary schools, health care workers, or international travelers. People who received inactivated measles vaccine or an unknown type of measles vaccine during 1963-1967 should receive 2 doses of MMR vaccine. People who received inactivated mumps vaccine or an unknown type of mumps vaccine before 1979 and are at high risk for mumps infection should consider immunization with 2 doses of MMR vaccine. For females of childbearing age, rubella immunity should be determined. If there is no evidence of immunity, females  who are not pregnant should be vaccinated. If there is no evidence of immunity, females who are pregnant should delay immunization until after pregnancy. Unvaccinated health care workers born before [birth year] who lack laboratory evidence of measles, mumps, or rubella immunity or laboratory confirmation of disease should consider measles and mumps immunization with 2 doses of MMR vaccine or rubella immunization with 1 dose of MMR vaccine. Pneumococcal 13-valent conjugate (PCV13) vaccine. When indicated, a person who is uncertain of her immunization history and has no record of immunization should receive the PCV13 vaccine. An adult aged 4 years or older who has certain medical conditions and has not been previously immunized should receive 1 dose of PCV13 vaccine. This PCV13 should be followed with a dose of pneumococcal polysaccharide (PPSV23) vaccine. The PPSV23 vaccine dose should be obtained at least 1 or more year(s) after the dose of PCV13 vaccine. An adult aged 15 years or older who has certain medical conditions and previously received 1 or more doses of PPSV23 vaccine should receive 1 dose of PCV13. The PCV13 vaccine dose should be obtained 1 or more years after the last PPSV23 vaccine dose.  Pneumococcal polysaccharide (PPSV23) vaccine. When PCV13 is also indicated, PCV13 should be obtained first. All adults aged 9 years and older should be immunized. An adult younger than age 14 years who has certain medical conditions should be immunized. Any person who resides in a nursing home or long-term care facility should be immunized. An adult smoker should be immunized. People with an immunocompromised condition and certain other conditions should receive both PCV13 and PPSV23 vaccines. People with human immunodeficiency virus (HIV) infection should be immunized as soon as possible after diagnosis. Immunization during chemotherapy or radiation therapy should be avoided. Routine use of PPSV23 vaccine is not  recommended for American Indians, [place] Natives, or people younger than 65 years unless there are medical conditions that require PPSV23 vaccine. When indicated, people who have unknown immunization and have no record of immunization should receive  PPSV23 vaccine. One-time revaccination 5 years after the first dose of PPSV23 is recommended for people aged 19-64 years who have chronic kidney failure, nephrotic syndrome, asplenia, or immunocompromised conditions. People who received 1-2 doses of PPSV23 before age 36 years should receive another dose of PPSV23 vaccine at age 52 years or later if at least 5 years have passed since the previous dose. Doses of PPSV23 are not needed for people immunized with PPSV23 at or after age 34 years.  Preventive Services / Frequency  Ages 74 to 1 years Blood pressure check. Lipid and cholesterol check. Lung cancer screening. / Every year if you are aged 53-80 years and have a 30-pack-year history of smoking and currently smoke or have quit within the past 15 years. Yearly screening is stopped once you have quit smoking for at least 15 years or develop a health problem that would prevent you from having lung cancer treatment. Clinical breast exam.** / Every year after age 16 years.  BRCA-related cancer risk assessment.** / For women who have family members with a BRCA-related cancer (breast, ovarian, tubal, or peritoneal cancers). Mammogram.** / Every year beginning at age 26 years and continuing for as long as you are in good health. Consult with your health care provider. Pap test.** / Every 3 years starting at age 19 years through age 52 or 29 years with a history of 3 consecutive normal Pap tests. HPV screening.** / Every 3 years from ages 18 years through ages 4 to 110 years with a history of 3 consecutive normal Pap tests. Fecal occult blood test (FOBT) of stool. / Every year beginning at age 70 years and continuing until age 53 years. You may not need to do this  test if you get a colonoscopy every 10 years. Flexible sigmoidoscopy or colonoscopy.** / Every 5 years for a flexible sigmoidoscopy or every 10 years for a colonoscopy beginning at age 66 years and continuing until age 44 years. Hepatitis C blood test.** / For all people born from [birth year] through 1965 and any individual with known risks for hepatitis C. Skin self-exam. / Monthly. Influenza vaccine. / Every year. Tetanus, diphtheria, and acellular pertussis (Tdap/Td) vaccine.** / Consult your health care provider. Pregnant women should receive 1 dose of Tdap vaccine during each pregnancy. 1 dose of Td every 10 years. Varicella vaccine.** / Consult your health care provider. Pregnant females who do not have evidence of immunity should receive the first dose after pregnancy. Zoster vaccine.** / 1 dose for adults aged 90 years or older. Pneumococcal 13-valent conjugate (PCV13) vaccine.** / Consult your health care provider. Pneumococcal polysaccharide (PPSV23) vaccine.** / 1 to 2 doses if you smoke cigarettes or if you have certain conditions. Meningococcal vaccine.** / Consult your health care provider. Hepatitis A vaccine.** / Consult your health care provider. Hepatitis B vaccine.** / Consult your health care provider. Screening for abdominal aortic aneurysm (AAA)  by ultrasound is recommended for people over 50 who have history of high blood pressure or who are current or former smokers. ++++++++++++++++++ Recommend Adult Low Dose Aspirin or  coated  Aspirin 81 mg daily  To reduce risk of Colon Cancer 40 %,  Skin Cancer 26 % ,  Melanoma 46%  and  Pancreatic cancer 60% +++++++++++++++++++ Vitamin D goal  is between 70-100.  Please make sure that you are taking your Vitamin D as directed.  It is very important as a natural anti-inflammatory  helping hair, skin, and nails, as well as reducing stroke and heart  attack risk.  It helps your bones and helps with mood. It also decreases numerous  cancer risks so please take it as directed.  Low Vit D is associated with a 200-300% higher risk for CANCER  and 200-300% higher risk for HEART   ATTACK  &  STROKE.   .....................................Marland Kitchen It is also associated with higher death rate at younger ages,  autoimmune diseases like Rheumatoid arthritis, Lupus, Multiple Sclerosis.    Also many other serious conditions, like depression, Alzheimer's Dementia, infertility, muscle aches, fatigue, fibromyalgia - just to name a few. ++++++++++++++++++ Recommend the book "The END of DIETING" by Dr Excell Seltzer  & the book "The END of DIABETES " by Dr Excell Seltzer At [email or web address] - get book & Audio CD's    Being diabetic has a  300% increased risk for heart attack, stroke, cancer, and alzheimer- type vascular dementia. It is very important that you work harder with diet by avoiding all foods that are white. Avoid white rice (brown & wild rice is OK), white potatoes (sweetpotatoes in moderation is OK), White bread or wheat bread or anything made out of white flour like bagels, donuts, rolls, buns, biscuits, cakes, pastries, cookies, pizza crust, and pasta (made from white flour & egg whites) - vegetarian pasta or spinach or wheat pasta is OK. Multigrain breads like Arnold's or [employer], or multigrain sandwich thins or flatbreads.  Diet, exercise and weight loss can reverse and cure diabetes in the early stages.  Diet, exercise and weight loss is very important in the control and prevention of complications of diabetes which affects every system in your body, ie. Brain - dementia/stroke, eyes - glaucoma/blindness, heart - heart attack/heart failure, kidneys - dialysis, stomach - gastric paralysis, intestines - malabsorption, nerves - severe painful neuritis, circulation - gangrene & loss of a leg(s), and finally cancer and Alzheimers.    I recommend avoid fried & greasy foods,  sweets/candy, white rice (brown or wild rice or Quinoa is OK), white  potatoes (sweet potatoes are OK) - anything made from white flour - bagels, doughnuts, rolls, buns, biscuits,white and wheat breads, pizza crust and traditional pasta made of white flour & egg white(vegetarian pasta or spinach or wheat pasta is OK).  Multi-grain bread is OK - like multi-grain flat bread or sandwich thins. Avoid alcohol in excess. Exercise is also important.    Eat all the vegetables you want - avoid meat, especially red meat and dairy - especially cheese.  Cheese is the most concentrated form of trans-fats which is the worst thing to clog up our arteries. Veggie cheese is OK which can be found in the fresh produce section at [employer] or [employer] or [employer]  ++++++++++++++++++++++ DASH Eating Plan  DASH stands for "Dietary Approaches to Stop Hypertension."   The DASH eating plan is a healthy eating plan that has been shown to reduce high blood pressure (hypertension). Additional health benefits may include reducing the risk of type 2 diabetes mellitus, heart disease, and stroke. The DASH eating plan may also help with weight loss. WHAT DO I NEED TO KNOW ABOUT THE DASH EATING PLAN? For the DASH eating plan, you will follow these general guidelines: Choose foods with a percent daily value for sodium of less than 5% (as listed on the food label). Use salt-free seasonings or herbs instead of table salt or sea salt. Check with your health care provider or pharmacist before using salt substitutes. Eat lower-sodium products, often labeled as "lower sodium" or "no  salt added." Eat fresh foods. Eat more vegetables, fruits, and low-fat dairy products. Choose whole grains. Look for the word "whole" as the first word in the ingredient list. Choose fish  Limit sweets, desserts, sugars, and sugary drinks. Choose heart-healthy fats. Eat veggie cheese  Eat more home-cooked food and less restaurant, buffet, and fast food. Limit fried foods. Cook foods using methods other than  frying. Limit canned vegetables. If you do use them, rinse them well to decrease the sodium. When eating at a restaurant, ask that your food be prepared with less salt, or no salt if possible.                      WHAT FOODS CAN I EAT? Read Dr Fara Olden Fuhrman's books on The End of Dieting & The End of Diabetes  Grains Whole grain or whole wheat bread. Brown rice. Whole grain or whole wheat pasta. Quinoa, bulgur, and whole grain cereals. Low-sodium cereals. Corn or whole wheat flour tortillas. Whole grain cornbread. Whole grain crackers. Low-sodium crackers.  Vegetables Fresh or frozen vegetables (raw, steamed, roasted, or grilled). Low-sodium or reduced-sodium tomato and vegetable juices. Low-sodium or reduced-sodium tomato sauce and paste. Low-sodium or reduced-sodium canned vegetables.   Fruits All fresh, canned (in natural juice), or frozen fruits.  Protein Products  All fish and seafood.  Dried beans, peas, or lentils. Unsalted nuts and seeds. Unsalted canned beans.  Dairy Low-fat dairy products, such as skim or 1% milk, 2% or reduced-fat cheeses, low-fat ricotta or cottage cheese, or plain low-fat yogurt. Low-sodium or reduced-sodium cheeses.  Fats and Oils Tub margarines without trans fats. Light or reduced-fat mayonnaise and salad dressings (reduced sodium). Avocado. Safflower, olive, or canola oils. Natural peanut or almond butter.  Other Unsalted popcorn and pretzels. The items listed above may not be a complete list of recommended foods or beverages. Contact your dietitian for more options.  ++++++++++++++++++  WHAT FOODS ARE NOT RECOMMENDED? Grains/ White flour or wheat flour White bread. White pasta. White rice. Refined cornbread. Bagels and croissants. Crackers that contain trans fat.  Vegetables  Creamed or fried vegetables. Vegetables in a . Regular canned vegetables. Regular canned tomato sauce and paste. Regular tomato and vegetable juices.  Fruits Dried fruits.  Canned fruit in light or heavy syrup. Fruit juice.  Meat and Other Protein Products Meat in general - RED meat & White meat.  Fatty cuts of meat. Ribs, chicken wings, all processed meats as bacon, sausage, bologna, salami, fatback, hot dogs, bratwurst and packaged luncheon meats.  Dairy Whole or 2% milk, cream, half-and-half, and cream cheese. Whole-fat or sweetened yogurt. Full-fat cheeses or blue cheese. Non-dairy creamers and whipped toppings. Processed cheese, cheese spreads, or cheese curds.  Condiments Onion and garlic salt, seasoned salt, table salt, and sea salt. Canned and packaged gravies. Worcestershire sauce. Tartar sauce. Barbecue sauce. Teriyaki sauce. Soy sauce, including reduced sodium. Steak sauce. Fish sauce. Oyster sauce. Cocktail sauce. Horseradish. Ketchup and mustard. Meat flavorings and tenderizers. Bouillon cubes. Hot sauce. Tabasco sauce. Marinades. Taco seasonings. Relishes.  Fats and Oils Butter, stick margarine, lard, shortening and bacon fat. Coconut, palm kernel, or palm oils. Regular salad dressings.  Pickles and olives. Salted popcorn and pretzels.  The items listed above may not be a complete list of foods and beverages to avoid.

## 2022-06-06 NOTE — Progress Notes (Unsigned)
Annual Screening/Preventative Visit & Comprehensive Evaluation &  Examination   Future Appointments  Date Time Provider Department  06/08/2022 11:00 AM Unk Pinto, MD [facility]  12/08/2022  4:00 PM Alycia Rossetti, NP [facility]        This very nice 61 y.o. MWF presents for a Screening /Preventative Visit & comprehensive evaluation and management of multiple medical co-morbidities.  Patient has been followed for HTN, ASCAD, Rheumatoid Arthritis,  HLD, Prediabetes, Hypothyroidism, Morbid Obesity and Vitamin D Deficiency.       In May 2021 she had a LD Screening Chest CT scan. Former smoker, 40 pack year history, quit in 2015.  On Jul 07, 2021 she had a 2sd Neg LD Screening Chest CT for Lung cancer which also showed mild LAD & Cx calcifications & Thoracic Aortic Atherosclerosis.  She was referred to Dr Cathie Olden who recommended an exercise Nuclear Stress which was interpreted  Normal & Low Risk.                                            Since 2009, patient has been on SS Disability for Rheumatoid Arthritis and she is  on Simponi   followed by Dr Dossie Der.  She has chronic neck & back pain attributed to RA & DDD. Patient also is on Doxycycline for Hidradenitis.          HTN predates since  42 . Patient's BP has been controlled at home. Chest CT scan in  May 2021 showed Cor Aa Atherosclerosis.  Patient denies any cardiac symptoms as chest pain, palpitations, shortness of breath, dizziness or ankle swelling. Today's BP is at goal - 118/74.       Patient's hyperlipidemia is controlled with diet and Rosuvastatin  /Repatha. Patient denies myalgias or other medication SE's. Last lipids were at goal:  Lab Results  Component Value Date   CHOL 196 03/02/2022   HDL 82 03/02/2022   LDLCALC 99 03/02/2022   TRIG 65 03/02/2022   CHOLHDL 2.4 03/02/2022         Patient has hx/o Morbid Obesity (BMI 36.7+)  and Prediabetes (A1c 5.9% /2010) and  in 2019 was started on Metformin by Dr Suzette Battiest for  Insulin Resistance.   Patient denies reactive hypoglycemic symptoms, visual blurring, diabetic polys or paresthesias. Last A1c was normal & at goal:  Lab Results  Component Value Date   HGBA1C 5.4 03/02/2022                                             The patient had a Thyroidectomy in 1995 for thyroid cancer and has been on replacement /suppressive therapy since managed by DrBalen. $ of her last 5 TSH's appear over suppressed (last was 0.25 in August) .          Finally, patient has history of Vitamin D Deficiency ("27" /2009 & "37" /2016) and last Vitamin D was  not at goal (70-100):  Lab Results  Component Value Date   VD25OH 62 06/08/2021       Current Outpatient Medications  Medication Instructions   acetaminophen 500 mg Every 6 hours PRN   Acetylcysteine  1 capsule Daily   albuterol (PROVENTIL) 2.5 mg, Neb, Every 6 hours PRN   Albuterol  HFA  inhaler INHALE 2 PUFFS  EVERY 4 HOURS AS NEEDED FOR ASTHMA   amitriptyline 25 MG tablet Take 1 tablet every Morning & 3 tablets at Bedtime for Chronic Pain   ASPIRIN 81  Daily   BELBUCA 150 MCG FILM SMARTSIG:1 Strip(s) By Mouth Every 12 Hours   TURMERIC CURCUMIN 01-999 MG  Takes 1 capsule  Daily   CRANBERRY PO 1 tablet 2 times daily   VITAMIN B12    1,000 mcg  Daily   diclofenac 2 g   Topical As needed   Evolocumab (REPATHA ) 140 MG/ML  1 mL, Subcutaneous, Every 14 days   Eyelid Cleansers (AVENOVA) 4.81 % SOLN 1 application , Both Eyes, 2 times daily,   Fexofenadine 180 mg , Oral, Daily PRN   BREO ELLIPTA   100-25  Use  1 Inhalation Daily   furosemide 40 mg , Oral, Weekly   Golimumab (SIMPONI ARIA IV) 2 mg/kg, Intravenous, Every 8 weeks   METHYL FOLATE 5,000 mg   Daily   levothyroxine 100 MCG tablet Take  1 tablet  Daily    LOTEMAX 0.5 % GEL INSTILL 1 DROP IN BOTH EYES THREE TIMES DAILY   Magnesium   1,000 mg Daily   MILK THISTLE EXTRACT  2 capsules  Daily   montelukast  10 MG tablet TAKE 1 TABLET DAILY FOR ALLERGIES    olmesartan  20 MG tablet TAKE 1 TABLET  DAILY    Omega-3 FISH OIL 1200 MG CAPS 2 capsules  Daily   Polyethyl Glycol-Propyl Glycol 0.4-0.3 % SOLN 1 drop, Both Eyes, 4 times daily   RESTASIS 0.05 % ophth emulsion 1 drop, Both Eyes, Daily   rosuvastatin  40 MG tablet TAKE 1 TABLET  DAILY    NASACORT nasal inhaler 1 spray, Nasal, 2 times daily   Vitamin D     10,000 Units Daily     Allergies  Allergen Reactions   Ciprofloxacin Hives and Swelling    SWELLING REACTION UNSPECIFIED    Levaquin [Levofloxacin] Hives   Lyrica [Pregabalin] Other (See Comments)    Altered mental status ALTERED MENTAL STATUS HALLUCINATIONS   Compazine [Prochlorperazine Edisylate]     UNSPECIFIED REACTION    Gabapentin     Diarrhea and nausea   Septra [Sulfamethoxazole-Trimethoprim]     itch   Zetia [Ezetimibe]     Stomach cramping   Hydrocodone Itching   Morphine And Related Itching   Penicillins Itching   Sulfa Antibiotics Itching     Past Medical History:  Diagnosis Date   Arthritis    OA, RA   Asthma    with a cold   Autoimmune deficiency syndrome (HCC)    Back pain    Cancer (HCC)    thyroid-1995   Family history of adverse reaction to anesthesia    mother problems with n/v   Fibromyalgia    GERD (gastroesophageal reflux disease)    Hepatitis    'A" when pt. was 12 yrs. old   Hiatal hernia    Hyperlipidemia    Hypertension    Hypothyroidism    Pneumonia    S/P total knee arthroplasty 02/16/2016     Health Maintenance  Topic Date Due   COVID-19 Vaccine (1) Never done   Zoster Vaccines- Shingrix (1 of 2) Never done   INFLUENZA VACCINE  04/06/2021   MAMMOGRAM  07/02/2021   TETANUS/TDAP  09/15/2022   Hepatitis C Screening  Completed   HIV Screening  Completed   HPV VACCINES  Aged Out   PAP SMEAR-Modifier  Discontinued     Immunization History  Administered Date(s) Administered   DTaP 09/15/2012, 09/15/2012   Influenza Inj Mdck Quad With Preservative 08/02/2017   Influenza  Whole 09/06/2012   Influenza-Unspecified 05/07/2014, 05/20/2016, 08/02/2017   Pneumococcal Conjugate-13 07/05/2014   Pneumococcal Polysaccharide-23 03/02/2021   Zoster, Live 09/03/2013    Last Colon - 12/02/2021 - Dr Fuller Plan - Recc 10 year f/u due Apr 2033   Last MGM - 07/02/2020 - overdue  ( Patient relates MGM scheduled for next month )   Past Surgical History:  Procedure Laterality Date   ANTERIOR CERVICAL DECOMP/DISCECTOMY FUSION N/A 05/30/2017   Procedure: Cervical five-six, Cervical six-seven Anterior discectomy with fusion and plate fixation;  Surgeon: Ditty, Kevan Ny, MD;  Location: [facility];  Service: Neurosurgery;  Laterality: N/A;   ANTERIOR LAT LUMBAR FUSION Left 01/26/2013   Procedure: ANTERIOR LATERAL LUMBAR FUSION 1 LEVEL;  Surgeon: Faythe Ghee, MD;  Location: [facility];  Service: Neurosurgery;  Laterality: Left;  lumbar four-five   KNEE ARTHROPLASTY Left 02/16/2016   Procedure: COMPUTER ASSISTED TOTAL KNEE ARTHROPLASTY;  Surgeon: Dereck Leep, MD;  Location: [facility];  Service: Orthopedics;  Laterality: Left;   KNEE SURGERY     3 on right knee, 1 on left-prior to TKR   LUMBAR PERCUTANEOUS PEDICLE SCREW 1 LEVEL Left 01/26/2013   Procedure: LUMBAR PERCUTANEOUS PEDICLE SCREW 1 LEVEL;  Surgeon: Faythe Ghee, MD;  Location: [facility];  Service: Neurosurgery;  Laterality: Left;  lumbar four-five   PILONIDAL CYST EXCISION     THYROIDECTOMY     TONSILLECTOMY     TOTAL KNEE ARTHROPLASTY     right   UPPER GASTROINTESTINAL ENDOSCOPY     VAGINAL HYSTERECTOMY       Family History  Problem Relation Age of Onset   Hypertension Father    Stroke Father 58   Heart attack Father 90   Lung cancer Father 44       smoker   Diabetes Brother    Asthma Brother    Other Daughter        XED CONNECTIVE TISSUE   Breast cancer Maternal Grandmother 34   Hypertension Maternal Grandmother    Stroke Paternal Grandmother        late in life   Heart attack Paternal  Grandfather        late in life   Alzheimer's disease Paternal Grandfather        late onset   Colon cancer Neg Hx    Esophageal cancer Neg Hx    Stomach cancer Neg Hx    Rectal cancer Neg Hx      Social History   Tobacco Use   Smoking status: Former    Packs/day: 0.75    Years: 40.00    Pack years: 30.00    Types: Cigarettes    Quit date: 07/05/2015    Years since quitting: 5.9   Smokeless tobacco: Never  Substance Use Topics   Alcohol use: Yes    Alcohol/week: 0.0 standard drinks    Comment: rare   Drug use: No      ROS Constitutional: Denies fever, chills, weight loss/gain, headaches, insomnia,  night sweats, and change in appetite. Does c/o fatigue. Eyes: Denies redness, blurred vision, diplopia, discharge, itchy, watery eyes.  ENT: Denies discharge, congestion, post nasal drip, epistaxis, sore throat, earache, hearing loss, dental pain, Tinnitus, Vertigo, Sinus pain, snoring.  Cardio: Denies chest pain, palpitations, irregular  heartbeat, syncope, dyspnea, diaphoresis, orthopnea, PND, claudication, edema Respiratory: denies cough, dyspnea, DOE, pleurisy, hoarseness, laryngitis, wheezing.  Gastrointestinal: Denies dysphagia, heartburn, reflux, water brash, pain, cramps, nausea, vomiting, bloating, diarrhea, constipation, hematemesis, melena, hematochezia, jaundice, hemorrhoids Genitourinary: Denies dysuria, frequency, urgency, nocturia, hesitancy, discharge, hematuria, flank pain Breast: Breast lumps, nipple discharge, bleeding.  Musculoskeletal: Denies arthralgia, myalgia, stiffness, Jt. Swelling, pain, limp, and strain/sprain. Denies falls. Skin: Denies puritis, rash, hives, warts, acne, eczema, changing in skin lesion Neuro: No weakness, tremor, incoordination, spasms, paresthesia, pain Psychiatric: Denies confusion, memory loss, sensory loss. Denies Depression. Endocrine: Denies change in weight, skin, hair change, nocturia, and paresthesia, diabetic polys, visual  blurring, hyper / hypo glycemic episodes.  Heme/Lymph: No excessive bleeding, bruising, enlarged lymph nodes.  Physical Exam  LMP  (LMP Unknown)   General Appearance:   Over nourished  and in no apparent distress.  Eyes: PERRLA, EOMs, conjunctiva no swelling or erythema, normal fundi and vessels. Sinuses: No frontal/maxillary tenderness ENT/Mouth: EACs patent / TMs  nl. Nares clear without erythema, swelling, mucoid exudates. Oral hygiene is good. No erythema, swelling, or exudate. Tongue normal, non-obstructing. Tonsils not swollen or erythematous. Hearing normal.  Neck: Supple, thyroid not palpable. No bruits, nodes or JVD. Respiratory: Respiratory effort normal.  BS equal and clear bilateral without rales, rhonci, wheezing or stridor. Cardio: Heart sounds are normal with regular rate and rhythm and no murmurs, rubs or gallops. Peripheral pulses are normal and equal bilaterally without edema. No aortic or femoral bruits. Chest: symmetric with normal excursions and percussion. Breasts: Symmetric, without lumps, nipple discharge, retractions, or fibrocystic changes.  Abdomen: Flat, soft with bowel sounds active. Nontender, no guarding, rebound, hernias, masses, or organomegaly.  Lymphatics: Non tender without lymphadenopathy.  Musculoskeletal: Full ROM all peripheral extremities, joint stability, 5/5 strength, and normal gait. Skin: Warm and dry without rashes, lesions, cyanosis, clubbing or  ecchymosis.  Neuro: Cranial nerves intact, reflexes equal bilaterally. Normal muscle tone, no cerebellar symptoms. Sensation intact.  Pysch: Alert and oriented X 3, normal affect, Insight and Judgment appropriate.    Assessment and Plan  1. Annual Preventative Screening Examination   2. Essential hypertension  - EKG 12-Lead - Urinalysis, Routine w reflex microscopic - Microalbumin / creatinine urine ratio - CBC with Differential/Platelet - COMPLETE METABOLIC PANEL WITH GFR - Magnesium -  TSH  3. Hyperlipidemia, mixed  - EKG 12-Lead - Lipid panel - TSH  4. Abnormal glucose  - EKG 12-Lead - Hemoglobin A1c - Insulin, random  5. Insulin resistance  - EKG 12-Lead - Hemoglobin A1c - Insulin, random  6. Vitamin D deficiency  - VITAMIN D 25 Hydroxy   7. Hypothyroidism  - TSH  8. CAD in native artery  - EKG 12-Lead  9. Aortic atherosclerosis ([place]) by Chest CT scan  May 2021  - EKG 12-Lead - Lipid panel  10. Rheumatoid arthritis involving multiple sites with positive rheumatoid factor ([place])   11. Sjogren's syndrome, with unspecified organ involvement ([place])   12. Class 2 severe obesity with serious comorbidity and body mass index (BMI) of 37.0 to 37.9 in adult, unspecified obesity type (HCC)  - TSH  13. Screening for heart disease  - EKG 12-Lead  14. FHx: heart disease  - EKG 12-Lead  15. Former smoker, 30 pack year, quit 2015  - EKG 12-Lead  16. Medication management  - Urinalysis, Routine w reflex microscopic - Microalbumin / creatinine urine ratio - CBC with Differential/Platelet - COMPLETE METABOLIC PANEL WITH GFR - Magnesium - Lipid panel -  TSH - Hemoglobin A1c - Insulin, random - VITAMIN D 25 Hydroxy         Patient was counseled in prudent diet to achieve/maintain BMI less than 25 for weight control, BP monitoring, regular exercise and medications. Discussed med's effects and SE's. Screening labs and tests as requested with regular follow-up as recommended. Over 40 minutes of exam, counseling, chart review and high complex critical decision making was performed.   Kirtland Bouchard, MD

## 2022-06-08 ENCOUNTER — Encounter: Payer: Self-pay | Admitting: Internal Medicine

## 2022-06-08 ENCOUNTER — Ambulatory Visit (INDEPENDENT_AMBULATORY_CARE_PROVIDER_SITE_OTHER): Payer: Medicare Other | Admitting: Internal Medicine

## 2022-06-08 VITALS — BP 118/64 | HR 90 | Temp 97.9°F | Resp 17 | Ht 62.5 in | Wt 205.4 lb

## 2022-06-08 DIAGNOSIS — Z8249 Family history of ischemic heart disease and other diseases of the circulatory system: Secondary | ICD-10-CM | POA: Diagnosis not present

## 2022-06-08 DIAGNOSIS — Z0000 Encounter for general adult medical examination without abnormal findings: Secondary | ICD-10-CM | POA: Diagnosis not present

## 2022-06-08 DIAGNOSIS — M3500 Sicca syndrome, unspecified: Secondary | ICD-10-CM

## 2022-06-08 DIAGNOSIS — I700 Atherosclerosis of aorta: Secondary | ICD-10-CM

## 2022-06-08 DIAGNOSIS — Z87891 Personal history of nicotine dependence: Secondary | ICD-10-CM

## 2022-06-08 DIAGNOSIS — Z136 Encounter for screening for cardiovascular disorders: Secondary | ICD-10-CM | POA: Diagnosis not present

## 2022-06-08 DIAGNOSIS — R7309 Other abnormal glucose: Secondary | ICD-10-CM | POA: Diagnosis not present

## 2022-06-08 DIAGNOSIS — I10 Essential (primary) hypertension: Secondary | ICD-10-CM | POA: Diagnosis not present

## 2022-06-08 DIAGNOSIS — E782 Mixed hyperlipidemia: Secondary | ICD-10-CM | POA: Diagnosis not present

## 2022-06-08 DIAGNOSIS — M0579 Rheumatoid arthritis with rheumatoid factor of multiple sites without organ or systems involvement: Secondary | ICD-10-CM

## 2022-06-08 DIAGNOSIS — E88819 Insulin resistance, unspecified: Secondary | ICD-10-CM

## 2022-06-08 DIAGNOSIS — E039 Hypothyroidism, unspecified: Secondary | ICD-10-CM

## 2022-06-08 DIAGNOSIS — E559 Vitamin D deficiency, unspecified: Secondary | ICD-10-CM | POA: Diagnosis not present

## 2022-06-08 DIAGNOSIS — I2510 Atherosclerotic heart disease of native coronary artery without angina pectoris: Secondary | ICD-10-CM | POA: Diagnosis not present

## 2022-06-08 DIAGNOSIS — Z79899 Other long term (current) drug therapy: Secondary | ICD-10-CM | POA: Diagnosis not present

## 2022-06-08 DIAGNOSIS — Z0001 Encounter for general adult medical examination with abnormal findings: Secondary | ICD-10-CM

## 2022-06-09 LAB — VITAMIN D 25 HYDROXY (VIT D DEFICIENCY, FRACTURES): Vit D, 25-Hydroxy: 72 ng/mL (ref 30–100)

## 2022-06-09 LAB — CBC WITH DIFFERENTIAL/PLATELET
Absolute Monocytes: 281 cells/uL (ref 200–950)
Basophils Absolute: 11 cells/uL (ref 0–200)
Basophils Relative: 0.2 %
Eosinophils Absolute: 28 cells/uL (ref 15–500)
Eosinophils Relative: 0.5 %
HCT: 38.2 % (ref 35.0–45.0)
Hemoglobin: 13 g/dL (ref 11.7–15.5)
Lymphs Abs: 3295 cells/uL (ref 850–3900)
MCH: 29.5 pg (ref 27.0–33.0)
MCHC: 34 g/dL (ref 32.0–36.0)
MCV: 86.8 fL (ref 80.0–100.0)
MPV: 10.2 fL (ref 7.5–12.5)
Monocytes Relative: 5.1 %
Neutro Abs: 1887 cells/uL (ref 1500–7800)
Neutrophils Relative %: 34.3 %
Platelets: 296 10*3/uL (ref 140–400)
RBC: 4.4 10*6/uL (ref 3.80–5.10)
RDW: 13 % (ref 11.0–15.0)
Total Lymphocyte: 59.9 %
WBC: 5.5 10*3/uL (ref 3.8–10.8)

## 2022-06-09 LAB — COMPLETE METABOLIC PANEL WITH GFR
AG Ratio: 1.8 (calc) (ref 1.0–2.5)
ALT: 21 U/L (ref 6–29)
AST: 21 U/L (ref 10–35)
Albumin: 4.5 g/dL (ref 3.6–5.1)
Alkaline phosphatase (APISO): 70 U/L (ref 37–153)
BUN: 15 mg/dL (ref 7–25)
CO2: 26 mmol/L (ref 20–32)
Calcium: 9.5 mg/dL (ref 8.6–10.4)
Chloride: 103 mmol/L (ref 98–110)
Creat: 0.71 mg/dL (ref 0.50–1.05)
Globulin: 2.5 g/dL (calc) (ref 1.9–3.7)
Glucose, Bld: 93 mg/dL (ref 65–99)
Potassium: 4.3 mmol/L (ref 3.5–5.3)
Sodium: 138 mmol/L (ref 135–146)
Total Bilirubin: 0.3 mg/dL (ref 0.2–1.2)
Total Protein: 7 g/dL (ref 6.1–8.1)
eGFR: 97 mL/min/{1.73_m2} (ref >=60)

## 2022-06-09 LAB — HEMOGLOBIN A1C
Hgb A1c MFr Bld: 5.5 % of total Hgb (ref <5.7)
Mean Plasma Glucose: 111 mg/dL
eAG (mmol/L): 6.2 mmol/L

## 2022-06-09 LAB — URINALYSIS, ROUTINE W REFLEX MICROSCOPIC
Bacteria, UA: NONE SEEN /HPF
Bilirubin Urine: NEGATIVE
Glucose, UA: NEGATIVE
Hyaline Cast: NONE SEEN /LPF
Ketones, ur: NEGATIVE
Leukocytes,Ua: NEGATIVE
Nitrite: NEGATIVE
Protein, ur: NEGATIVE
RBC / HPF: NONE SEEN /HPF (ref 0–2)
Specific Gravity, Urine: 1.009 (ref 1.001–1.035)
WBC, UA: NONE SEEN /HPF (ref 0–5)
pH: 7 (ref 5.0–8.0)

## 2022-06-09 LAB — LIPID PANEL
Cholesterol: 151 mg/dL (ref <200)
HDL: 93 mg/dL (ref >=50)
LDL Cholesterol (Calc): 41 mg/dL (calc)
Non-HDL Cholesterol (Calc): 58 mg/dL (calc) (ref <130)
Total CHOL/HDL Ratio: 1.6 (calc) (ref <5.0)
Triglycerides: 83 mg/dL (ref <150)

## 2022-06-09 LAB — MAGNESIUM: Magnesium: 1.9 mg/dL (ref 1.5–2.5)

## 2022-06-09 LAB — MICROALBUMIN / CREATININE URINE RATIO
Creatinine, Urine: 36 mg/dL (ref 20–275)
Microalb Creat Ratio: 78 mcg/mg creat — ABNORMAL HIGH (ref <30)
Microalb, Ur: 2.8 mg/dL

## 2022-06-09 LAB — TSH: TSH: 1.45 mIU/L (ref 0.40–4.50)

## 2022-06-09 LAB — MICROSCOPIC MESSAGE

## 2022-06-09 LAB — INSULIN, RANDOM: Insulin: 7.3 u[IU]/mL

## 2022-06-14 ENCOUNTER — Telehealth: Payer: Self-pay

## 2022-06-14 NOTE — Telephone Encounter (Signed)
LM-06/14/22-Chart review started. Reviewing OV, consults, medications, labs, and hospital visits. Chart review complete. Will call patient at another time.  Total time spent: 15 min.

## 2022-06-15 DIAGNOSIS — M0609 Rheumatoid arthritis without rheumatoid factor, multiple sites: Secondary | ICD-10-CM | POA: Diagnosis not present

## 2022-06-15 NOTE — Telephone Encounter (Signed)
LM-06/15/22-Called pt. And completed general review call. Pt. Reported no changes in her health since last visit w/ CP. Pt. Informed me she is having another Simponi infusion this afternoon. Pt. reported her fatigue has improved since her last infusion, but she still is experiencing her "normal" amount of fatigue daily per pt.  Total time spent: 11 min.

## 2022-07-06 DIAGNOSIS — I10 Essential (primary) hypertension: Secondary | ICD-10-CM | POA: Diagnosis not present

## 2022-07-06 DIAGNOSIS — E039 Hypothyroidism, unspecified: Secondary | ICD-10-CM | POA: Diagnosis not present

## 2022-07-06 DIAGNOSIS — M0579 Rheumatoid arthritis with rheumatoid factor of multiple sites without organ or systems involvement: Secondary | ICD-10-CM | POA: Diagnosis not present

## 2022-07-28 DIAGNOSIS — C73 Malignant neoplasm of thyroid gland: Secondary | ICD-10-CM | POA: Diagnosis not present

## 2022-07-28 DIAGNOSIS — R7301 Impaired fasting glucose: Secondary | ICD-10-CM | POA: Diagnosis not present

## 2022-07-28 DIAGNOSIS — E039 Hypothyroidism, unspecified: Secondary | ICD-10-CM | POA: Diagnosis not present

## 2022-08-06 ENCOUNTER — Other Ambulatory Visit: Payer: Self-pay | Admitting: Internal Medicine

## 2022-08-10 DIAGNOSIS — M0609 Rheumatoid arthritis without rheumatoid factor, multiple sites: Secondary | ICD-10-CM | POA: Diagnosis not present

## 2022-08-17 ENCOUNTER — Encounter: Payer: Self-pay | Admitting: Nurse Practitioner

## 2022-08-17 ENCOUNTER — Ambulatory Visit (INDEPENDENT_AMBULATORY_CARE_PROVIDER_SITE_OTHER): Payer: Medicare Other | Admitting: Nurse Practitioner

## 2022-08-17 VITALS — BP 144/92 | HR 92 | Temp 97.2°F | Ht 62.5 in | Wt 205.0 lb

## 2022-08-17 DIAGNOSIS — R519 Headache, unspecified: Secondary | ICD-10-CM

## 2022-08-17 DIAGNOSIS — R051 Acute cough: Secondary | ICD-10-CM

## 2022-08-17 DIAGNOSIS — K068 Other specified disorders of gingiva and edentulous alveolar ridge: Secondary | ICD-10-CM

## 2022-08-17 DIAGNOSIS — J0110 Acute frontal sinusitis, unspecified: Secondary | ICD-10-CM

## 2022-08-17 MED ORDER — DEXAMETHASONE SODIUM PHOSPHATE 10 MG/ML IJ SOLN
10.0000 mg | Freq: Once | INTRAMUSCULAR | Status: AC
  Administered 2022-08-17: 10 mg via INTRAMUSCULAR

## 2022-08-17 MED ORDER — AZITHROMYCIN 500 MG PO TABS: 500.0000 mg | ORAL_TABLET | Freq: Every day | ORAL | 0 refills | Status: AC

## 2022-08-17 NOTE — Patient Instructions (Signed)

## 2022-08-17 NOTE — Progress Notes (Signed)
Assessment and Plan:  Autumn Frye was seen today for an episodic visit.  Diagnoses and all order for this visit:  1. Acute non-recurrent frontal sinusitis Consider length of symptoms, 3 weeks, will start treatment with abx. Stay well hydrated to keep mucus thin and productive Continue Mucinex as directed.  - azithromycin (ZITHROMAX) 500 MG tablet; Take 1 tablet (500 mg total) by mouth daily for 10 days.  Dispense: 10 tablet; Refill: 0  2. Acute nonintractable headache, unspecified headache type OTC Tylenol or IBU as needed as directed.  - dexamethasone (DECADRON) injection 10 mg  3. Pain in gums OTC Tylenol or IBU as needed as directed.  4. Acute cough Consider Corciden cough medication OTC Stay well hydrated.  - dexamethasone (DECADRON) injection 10 mg  Possible CXR if s/s fail to improve.  Notify office for further evaluation and treatment, questions or concerns if s/s fail to improve. The risks and benefits of my recommendations, as well as other treatment options were discussed with the patient today. Questions were answered.  Further disposition pending results of labs. Discussed med's effects and SE's.    Over 15 minutes of exam, counseling, chart review, and critical decision making was performed.   Future Appointments  Date Time Provider [place]  09/08/2022 10:30 AM Alycia Rossetti, NP [facility] None  12/08/2022  9:30 AM Unk Pinto, MD [facility] None  06/13/2023 11:00 AM Unk Pinto, MD [facility] None    ------------------------------------------------------------------------------------------------------------------   HPI BP (!) 144/92   Pulse 92   Temp (!) 97.2 F (36.2 C)   Ht 5' 2.5" (1.588 m)   Wt 205 lb (93 kg)   LMP  (LMP Unknown)   SpO2 96%   BMI 36.90 kg/m    Patient complains of symptoms of a URI, possible sinusitis. Symptoms include achiness, cough described as productive, facial pain, headache described as  throbbing, nasal congestion, sinus pressure, and tooth pain. Onset of symptoms was 3 weeks ago, and has been unchanged since that time. Treatment to date:  Sudafed and Mucinex .   Past Medical History:  Diagnosis Date   Arthritis    OA, RA   Asthma    with a cold   Autoimmune deficiency syndrome ([place])    Back pain    Cancer ([place])    thyroid-1995   Family history of adverse reaction to anesthesia    mother problems with n/v   Fibromyalgia    GERD (gastroesophageal reflux disease)    Hepatitis    'A" when pt. was 12 yrs. old   Hiatal hernia    Hyperlipidemia    Hypertension    Hypothyroidism    Pneumonia    S/P total knee arthroplasty 02/16/2016     Allergies  Allergen Reactions   Ciprofloxacin Hives and Swelling    SWELLING REACTION UNSPECIFIED    Levofloxacin Hives and Other (See Comments)   Lyrica [Pregabalin] Other (See Comments)    Altered mental status ALTERED MENTAL STATUS HALLUCINATIONS   Compazine [Prochlorperazine Edisylate]     UNSPECIFIED REACTION    Gabapentin Other (See Comments)    Diarrhea and nausea   Sulfamethoxazole-Trimethoprim Other (See Comments)    itch   Xeljanz [Tofacitinib] Hives   Zetia [Ezetimibe]     Stomach cramping   Hydrocodone Itching   Morphine And Related Itching   Penicillins Itching   Sulfa Antibiotics Itching    Current Outpatient Medications on File Prior to Visit  Medication Sig   acetaminophen (TYLENOL) 500 MG tablet  Take 1 tablet (500 mg total) by mouth every 6 (six) hours as needed.   Acetylcysteine (N-ACETYL-L-CYSTEINE PO) Take 1 capsule by mouth daily.   albuterol (PROVENTIL) (2.5 MG/3ML) 0.083% nebulizer solution Take 3 mLs (2.5 mg total) by nebulization every 6 (six) hours as needed for wheezing or shortness of breath.   albuterol (VENTOLIN HFA) 108 (90 Base) MCG/ACT inhaler INHALE 2 PUFFS INTO THE LUNGS EVERY 4 HOURS 15 MINUTES APART AS NEEDED FOR ASTHMA   amitriptyline (ELAVIL) 25 MG tablet Take 1 tablet every  Morning & 3 tablets at Bedtime for Chronic Pain   ASPIRIN 81 PO Take by mouth daily.   Black Pepper-Turmeric (TURMERIC CURCUMIN) 01-999 MG CAPS Takes 1 capsule  Daily   Cholecalciferol (VITAMIN D3) 5000 UNITS TABS Take 10,000 Units by mouth daily.    CRANBERRY PO Take 1 tablet by mouth 2 (two) times daily.   diclofenac Sodium (VOLTAREN) 1 % GEL Apply 2 g topically as needed (inflamation).   Evolocumab (REPATHA SURECLICK) 350 MG/ML SOAJ Inject 1 mL into the skin every 14 (fourteen) days.   Eyelid Cleansers (AVENOVA) 0.01 % SOLN Place 1 application into both eyes 2 (two) times daily. Apply to eyelids BID   fexofenadine (ALLEGRA) 180 MG tablet Take 180 mg by mouth daily as needed.   fluticasone furoate-vilanterol (BREO ELLIPTA) 100-25 MCG/ACT AEPB INHALE 1 PUFF BY MOUTH DAILY.   furosemide (LASIX) 40 MG tablet Take 1 tablet (40 mg total) by mouth once a week.   Golimumab (SIMPONI ARIA IV) Inject 2 mg/kg into the vein every 8 (eight) weeks.   Levomefolate Glucosamine (METHYLFOLATE PO) Take 5,000 mg by mouth daily.   levothyroxine (SYNTHROID) 100 MCG tablet Take  1 tablet  Daily  on an empty stomach with only water for 30 minutes & no Antacid meds, Calcium or Magnesium for 4 hours & avoid Biotin   LOTEMAX 0.5 % GEL INSTILL 1 DROP IN BOTH EYES THREE TIMES DAILY   Magnesium Oxide 500 MG TABS Take 1,000 mg by mouth daily.   MILK THISTLE EXTRACT PO Take 2 capsules by mouth daily.   montelukast (SINGULAIR) 10 MG tablet TAKE 1 TABLET BY MOUTH DAILY FOR ALLERGIES   mupirocin ointment (BACTROBAN) 2 % APPLY EXTERNALLY TO THE AFFECTED AREA THREE TIMES DAILY FOR 10 DAYS   olmesartan (BENICAR) 20 MG tablet TAKE 1 TABLET BY MOUTH DAILY FOR BLOOD PRESSURE   Omega-3 Fatty Acids (FISH OIL) 1200 MG CAPS Take 2 capsules by mouth daily.    Polyethyl Glycol-Propyl Glycol 0.4-0.3 % SOLN Place 1 drop into both eyes 4 (four) times daily.    RESTASIS 0.05 % ophthalmic emulsion Place 1 drop into both eyes daily.     rosuvastatin (CRESTOR) 40 MG tablet TAKE 1 TABLET BY MOUTH DAILY FOR CHOLESTEROL   triamcinolone (NASACORT) 55 MCG/ACT AERO nasal inhaler Place 1 spray into the nose 2 (two) times daily.   vitamin B-12 (CYANOCOBALAMIN) 1000 MCG tablet Take 1,000 mcg by mouth daily.   No current facility-administered medications on file prior to visit.    ROS: all negative except what is noted in the HPI.   Physical Exam:  BP (!) 144/92   Pulse 92   Temp (!) 97.2 F (36.2 C)   Ht 5' 2.5" (1.588 m)   Wt 205 lb (93 kg)   LMP  (LMP Unknown)   SpO2 96%   BMI 36.90 kg/m   General Appearance: NAD.  Awake, conversant and cooperative. Eyes: PERRLA, EOMs intact.  Sclera white.  Conjunctiva without erythema. Sinuses: Tender frontal/maxillary tenderness.  No nasal discharge. Nares patent.  ENT/Mouth: Ext aud canals clear.  Bilateral TMs w/DOL and without erythema or bulging. Hearing intact.  Posterior pharynx without swelling or exudate.  Tonsils without swelling or erythema.  Neck: Supple.  No masses, nodules or thyromegaly. Respiratory: Effort is regular with non-labored breathing. Breath sounds are equal bilaterally without rales, rhonchi, wheezing or stridor.  Cardio: RRR with no MRGs. Brisk peripheral pulses without edema.  Abdomen: Active BS in all four quadrants.  Soft and non-tender without guarding, rebound tenderness, hernias or masses. Lymphatics: Non tender without lymphadenopathy.  Musculoskeletal: Full ROM, 5/5 strength, normal ambulation.  No clubbing or cyanosis. Skin: Appropriate color for ethnicity. Warm without rashes, lesions, ecchymosis, ulcers.  Neuro: CN II-XII grossly normal. Normal muscle tone without cerebellar symptoms and intact sensation.   Psych: AO X 3,  appropriate mood and affect, insight and judgment.     Darrol Jump, NP 11:10 AM [facility]

## 2022-08-28 ENCOUNTER — Other Ambulatory Visit: Payer: Self-pay | Admitting: Nurse Practitioner

## 2022-08-28 DIAGNOSIS — J45909 Unspecified asthma, uncomplicated: Secondary | ICD-10-CM

## 2022-09-07 NOTE — Progress Notes (Unsigned)
MEDICARE ANNUAL WELLNESS VISIT AND FOLLOW UP  Assessment:    Diagnoses and all orders for this visit:  Annual Medicare Wellness Visit Due annually  Health maintenance reviewed  Atherosclerosis of aorta ([place]) - CT 06/2018 Control blood pressure, cholesterol, glucose, increase exercise.   CAD Low risk myoview 01/2021 Dr. Acie Fredrickson following LDL goal <70 - lipid clinic  Control blood pressure, cholesterol, glucose, increase exercise. Reminder to go to the ER if any CP, SOB, nausea, dizziness, severe fatigue   Essential hypertension Continue medication Monitor blood pressure at home; call if consistently over 130/80 Continue DASH diet.   Reminder to go to the ER if any CP, SOB, nausea, dizziness, severe HA, changes vision/speech, left arm numbness and tingling and jaw pain.  Intrinsic asthma Stable on inhalers/meds  Sjogren's syndrome, with unspecified organ involvement ([place]) On Symponi, followed by rheumatology  Rheumatoid arthritis involving multiple sites, unspecified rheumatoid factor presence ([place]) Managed by Dr. Dossie Frye, on [facility], has back stimulator Will try Methocarbomol for muscle relaxant  Cervical spondylosis with radiculopathy Currently fairly managed; followed by Dr. Dossie Frye and Dr. Cyndy Frye  Degeneration of intervertebral disc of lumbosacral region Continue NSAID PRN, follows rheum  Postoperative hypothyroidism Dr. Chalmers Frye continue medications the same pending lab results reminded to take on an empty stomach 30-4mns before food.  check TSH level  Vitamin D deficiency At goal at recent check; continue to recommend supplementation for goal of 70-100 Defer vitamin D level to CPE next OV  Other abnormal glucose Off of metformin Discussed disease and risks Discussed diet/exercise, weight management  A1C q61m  Morbid obesity ([place]- BMI 35+ with htn, hyperlipidemia  Long discussion about weight loss, diet, and exercise Recommended diet heavy in fruits and  veggies and low in animal meats, cheeses, and dairy products, appropriate calorie intake Discussed appropriate weight for height  Follow up at next visit  Mixed hyperlipidemia Continue medications; rosuvastatin 40 mg daily, repatha q2w Discussed LDL goal <70 Continue low cholesterol diet and exercise.  Defer lipid panel to lipid clinic  Medication management CBC, CMP/GFR, magnesium at routine visits   Former smoker 30 pack year, quit 2015, last CT screening 07/2021 , will reorder for this year      Over 40 minutes of exam, counseling, chart review and critical decision making was performed Future Appointments  Date Time Provider [place]4/11/2022  9:30 AM Autumn PintoMD [facility] None  06/13/2023 11:00 AM Autumn PintoMD [facility] None  03/08/2024 11:00 AM WiAlycia RossettiNP [facility] None     Frye:   During the course of the visit the patient was educated and counseled about appropriate screening and preventive services including:   Pneumococcal vaccine  Prevnar 13 Influenza vaccine Td vaccine Screening electrocardiogram Bone densitometry screening Colorectal cancer screening Diabetes screening Glaucoma screening Nutrition counseling  Advanced directives: requested   Subjective:  Autumn RADZIEWICZs a [age]. female who presents for Medicare Annual Wellness Visit and 3 month follow up. She has Intrinsic asthma; Essential hypertension; Hypothyroidism; Hyperlipidemia, mixed; Vitamin D deficiency; Medication management; Sjogren's disease ([place] Rheumatoid arthritis involving multiple sites with positive rheumatoid factor ([facility] Degeneration of intervertebral disc of lumbosacral region; Cervical spondylosis with radiculopathy; Abnormal glucose; Morbid obesity ([facility] [place] BMI 35+ with htn, hld, CVD; Hidradenitis suppurativa; Aortic atherosclerosis ([place]  May 2021; Former smoker, 30 pack year, quit 2015; Insulin resistance; Carotid  stenosis; and CAD in native artery on their problem list.   Patient is followed  by Dr Autumn Frye  for Rheumatoid Arthritis and Sjogren's since 2009. Currently on symponi per patient.  Chronic neck/lumbar pain, has spinal stimulator, no longer needing buprenorphine patches, follow up Dr. Carnella Frye only as needed. She is having a lot of stiffness, goes 09/25/22 for next infusion- this is 2nd infusion.  She has had good results with Methocarbomol. Spinal stimulator is helping.  Off all pain medications.   She is having feeling of being hot throughout the day, no night sweats.   Not like when she was in menopause, questioning Tumeric/curcumin.   Dr. Chalmers Frye with endocrinology following for hypothyroid and hidradenitis suppurativa. She has been on doxycycline 50 mg daily for many years as prophylaxis for this issue.  Asthma is controlled by singulaire daily, BREO PRN if has a cold.  No recent flares.   BMI is Body mass index is 37.11 kg/m., she has been working on diet and exercise, Has not been taking as much exercise- 2 granddaughter live with them 4 days a week. Does Frye to restart exercise Wt Readings from Last 3 Encounters:  09/08/22 206 lb 3.2 oz (93.5 kg)  08/17/22 205 lb (93 kg)  06/08/22 205 lb 6.4 oz (93.2 kg)   Former smoker, 30 pack year history, quit in 2015, had benign lungs on low dose CT screening 07/15/2021  CT did show aortic atherosclerosis and coronary artery calcifications.  CT coronary calcium 11/2020 showed total calcium score 139, 90-100% for age/gender, Dr. Acie Fredrickson is following.  She did have low risk myoview on 01/20/2021.   Her blood pressure has been controlled at home, today their BP is BP: 100/62  BP Readings from Last 3 Encounters:  09/08/22 100/62  08/17/22 (!) 144/92  06/08/22 118/64  She does workout. She denies chest pain, shortness of breath, dizziness.   She is on cholesterol medication (rosuvastatin 40 mg daily, repatha q2w and tolerating well, follows with lipid  clinic, labile LDLs, reports fasting today except coffee with cream this AM) and denies myalgias. Her LDL cholesterol is not at goal last check, has been at goal previously. The cholesterol last visit was:   Lab Results  Component Value Date   CHOL 151 06/08/2022   HDL 93 06/08/2022   LDLCALC 41 06/08/2022   TRIG 83 06/08/2022   CHOLHDL 1.6 06/08/2022    She has been working on diet and exercise for hx of prediabetes formerly on metformin but has been able to taper off, and denies foot ulcerations, increased appetite, nausea, paresthesia of the feet, polydipsia, polyuria and visual disturbances. Last A1C in the office was:  Lab Results  Component Value Date   HGBA1C 5.5 06/08/2022   She is on thyroid medication. She is currently on Levothyroxine 100 mcg daily Lab Results  Component Value Date   TSH 1.45 06/08/2022   She is trying to drink lots of water. Last GFR: Lab Results  Component Value Date   EGFR 97 06/08/2022    Patient is on Vitamin D supplement.   Lab Results  Component Value Date   VD25OH 72 06/08/2022      Medication Review: Current Outpatient Medications on File Prior to Visit  Medication Sig Dispense Refill   acetaminophen (TYLENOL) 500 MG tablet Take 1 tablet (500 mg total) by mouth every 6 (six) hours as needed. 30 tablet 0   Acetylcysteine (N-ACETYL-L-CYSTEINE PO) Take 1 capsule by mouth daily.     albuterol (PROVENTIL) (2.5 MG/3ML) 0.083% nebulizer solution Take 3 mLs (2.5 mg total) by nebulization every 6 (  six) hours as needed for wheezing or shortness of breath. 60 vial 1   albuterol (VENTOLIN HFA) 108 (90 Base) MCG/ACT inhaler INHALE 2 PUFFS INTO THE LUNGS EVERY 4 HOURS 15 MINUTES APART AS NEEDED FOR ASTHMA 40.2 g 1   amitriptyline (ELAVIL) 25 MG tablet Take 1 tablet every Morning & 3 tablets at Bedtime for Chronic Pain 360 tablet 3   ASPIRIN 81 PO Take by mouth daily.     Black Pepper-Turmeric (TURMERIC CURCUMIN) 01-999 MG CAPS Takes 1 capsule  Daily      Cholecalciferol (VITAMIN D3) 5000 UNITS TABS Take 10,000 Units by mouth daily.      CRANBERRY PO Take 1 tablet by mouth 2 (two) times daily.     diclofenac Sodium (VOLTAREN) 1 % GEL Apply 2 g topically as needed (inflamation).     Evolocumab (REPATHA SURECLICK) 818 MG/ML SOAJ Inject 1 mL into the skin every 14 (fourteen) days. 6 mL 3   Eyelid Cleansers (AVENOVA) 0.01 % SOLN Place 1 application into both eyes 2 (two) times daily. Apply to eyelids BID 1 Bottle 1   fexofenadine (ALLEGRA) 180 MG tablet Take 180 mg by mouth daily as needed.     fluticasone furoate-vilanterol (BREO ELLIPTA) 100-25 MCG/ACT AEPB INHALE 1 PUFF BY MOUTH DAILY. 60 each 3   furosemide (LASIX) 40 MG tablet Take 1 tablet (40 mg total) by mouth once a week. 30 tablet 2   Golimumab (SIMPONI ARIA IV) Inject 2 mg/kg into the vein every 8 (eight) weeks.     levothyroxine (SYNTHROID) 100 MCG tablet Take  1 tablet  Daily  on an empty stomach with only water for 30 minutes & no Antacid meds, Calcium or Magnesium for 4 hours & avoid Biotin 90 tablet 1   LOTEMAX 0.5 % GEL INSTILL 1 DROP IN BOTH EYES THREE TIMES DAILY 5 g 0   Magnesium Oxide 500 MG TABS Take 1,000 mg by mouth daily.     MILK THISTLE EXTRACT PO Take 2 capsules by mouth daily.     montelukast (SINGULAIR) 10 MG tablet TAKE 1 TABLET BY MOUTH DAILY FOR ALLERGIES 90 tablet 3   mupirocin ointment (BACTROBAN) 2 % APPLY EXTERNALLY TO THE AFFECTED AREA THREE TIMES DAILY FOR 10 DAYS 22 g 3   olmesartan (BENICAR) 20 MG tablet TAKE 1 TABLET BY MOUTH DAILY FOR BLOOD PRESSURE 90 tablet 1   Omega-3 Fatty Acids (FISH OIL) 1200 MG CAPS Take 2 capsules by mouth daily.      Polyethyl Glycol-Propyl Glycol 0.4-0.3 % SOLN Place 1 drop into both eyes 4 (four) times daily.      RESTASIS 0.05 % ophthalmic emulsion Place 1 drop into both eyes daily.   3   rosuvastatin (CRESTOR) 40 MG tablet TAKE 1 TABLET BY MOUTH DAILY FOR CHOLESTEROL 90 tablet 1   vitamin B-12 (CYANOCOBALAMIN) 1000 MCG tablet  Take 1,000 mcg by mouth daily.     No current facility-administered medications on file prior to visit.    Allergies  Allergen Reactions   Ciprofloxacin Hives and Swelling    SWELLING REACTION UNSPECIFIED    Levofloxacin Hives and Other (See Comments)   Lyrica [Pregabalin] Other (See Comments)    Altered mental status ALTERED MENTAL STATUS HALLUCINATIONS   Compazine [Prochlorperazine Edisylate]     UNSPECIFIED REACTION    Gabapentin Other (See Comments)    Diarrhea and nausea   Sulfamethoxazole-Trimethoprim Other (See Comments)    itch   Xeljanz [Tofacitinib] Hives   Zetia [Ezetimibe]  Stomach cramping   Hydrocodone Itching   Morphine And Related Itching   Penicillins Itching   Sulfa Antibiotics Itching    Current Problems (verified) Patient Active Problem List   Diagnosis Date Noted   CAD in native artery 12/03/2020   Carotid stenosis 10/25/2020   Insulin resistance 05/12/2020   Former smoker, 30 pack year, quit 2015 01/09/2020   Aortic atherosclerosis ([place]) by Chest CT scan  May 2021 01/08/2020   Hidradenitis suppurativa 05/16/2019   Morbid obesity ([place])- BMI 35+ with htn, hld, CVD 11/08/2017   Abnormal glucose 08/02/2017   Cervical spondylosis with radiculopathy 05/30/2017   Rheumatoid arthritis involving multiple sites with positive rheumatoid factor ([place]) 11/14/2015   Sjogren's disease ([place]) 07/05/2014   Medication management 03/26/2014   Hyperlipidemia, mixed 09/24/2013   Vitamin D deficiency 09/24/2013   Intrinsic asthma    Essential hypertension    Hypothyroidism    Degeneration of intervertebral disc of lumbosacral region 05/23/2008    Screening Tests Immunization History  Administered Date(s) Administered   DTaP 09/15/2012, 09/15/2012   Influenza Inj Mdck Quad With Preservative 08/02/2017   Influenza Whole 09/06/2012   Influenza-Unspecified 05/07/2014, 05/20/2016, 08/02/2017   Pneumococcal Conjugate-13 07/05/2014   Pneumococcal  Polysaccharide-23 03/02/2021   Zoster, Live 09/03/2013   Colonoscopy: 11/2021 -Dr. Fuller Frye, 10 year follow up  PAP/pelvic: s/p hysterectomy age 38, Dr. Edwyna Frye follows annually MGM: 07/2021, will call to schedule DEXA: 2013, normal  CT Lung Cancer screen 07/2021 benign due in 1 year Zostavax: reports had several years back, severe reaction, declines shingrix Covid 19: declines   Names of Other Physician/Practitioners you currently use: 1. Autumn Frye here for primary care 2. [place], eye doctor, last visit 2023 - mild cataracts 3. [facility], dentist, last visit 2022, goes q67m  Patient Care Team: MUnk Pinto MD as PCP - General (Internal Frye) Autumn Frye, Autumn Cheng MD as PCP - Cardiology (Cardiology) SLadene Artist MD as Consulting Physician (Gastroenterology) Autumn Minion MD as Consulting Physician (Orthopedic Surgery) AUnice Bailey MD as Consulting Physician (Rheumatology) KKarie Chimera MD as Consulting Physician (Neurosurgery) Autumn Frye Brownie MD as Consulting Physician (Dermatology) Autumn Frye Brownie MD as Consulting Physician (Dermatology) [facility] as Pharmacist (Pharmacist)  SURGICAL HISTORY She  has a past surgical history that includes Thyroidectomy; Upper gastrointestinal endoscopy; Total knee arthroplasty; Vaginal hysterectomy; Knee surgery; Pilonidal cyst excision; Tonsillectomy; Anterior lat lumbar fusion (Left, 01/26/2013); Lumbar percutaneous pedicle screw 1 level (Left, 01/26/2013); Knee Arthroplasty (Left, 02/16/2016); and Anterior cervical decomp/discectomy fusion (N/A, 05/30/2017). FAMILY HISTORY Her family history includes Alzheimer's disease in her paternal grandfather; Asthma in her brother; Breast cancer (age of onset: 772 in her maternal grandmother; Diabetes in her brother; Heart attack in her paternal grandfather; Heart attack (age of onset: 737 in her father; Hypertension in  her father and maternal grandmother; Lung cancer (age of onset: 871 in her father; Other in her daughter; Stroke in her paternal grandmother; Stroke (age of onset: 665 in her father. SOCIAL HISTORY She  reports that she quit smoking about 7 years ago. Her smoking use included cigarettes. She has a 30.00 pack-year smoking history. She has never used smokeless tobacco. She reports current alcohol use. She reports that she does not use drugs.   MEDICARE WELLNESS OBJECTIVES: Physical activity: Current Exercise Habits: Home exercise routine, Type of exercise: walking, Time (Minutes): 20, Frequency (Times/Week): 2, Weekly Exercise (Minutes/Week): 40, Intensity: Mild, Exercise limited by: orthopedic condition(s) Cardiac risk factors: Cardiac Risk Factors include:  advanced age (>22mn, >>33women);dyslipidemia;hypertension;obesity (BMI >30kg/m2);smoking/ tobacco exposure;sedentary lifestyle Depression/mood screen:      09/08/2022   11:01 AM  Depression screen PHQ 2/9  Decreased Interest 0  Down, Depressed, Hopeless 0  PHQ - 2 Score 0    ADLs:     09/08/2022   11:01 AM 06/06/2022    4:36 PM  In your present state of health, do you have any difficulty performing the following activities:  Hearing? 0 0  Vision?  0  Difficulty concentrating or making decisions? 0 0  Walking or climbing stairs? 0 0  Dressing or bathing? 0 0  Doing errands, shopping? 0 0     Cognitive Testing  Alert? Yes  Normal Appearance?Yes  Oriented to person? Yes  Place? Yes   Time? Yes  Recall of three objects?  Yes  Can perform simple calculations? Yes  Displays appropriate judgment?Yes  Can read the correct time from a watch face?Yes  EOL planning: Does Patient Have a Medical Advance Directive?: No Would patient like information on creating a medical advance directive?: Yes (MAU/Ambulatory/Procedural Areas - Information given)  Review of Systems  Constitutional:  Negative for malaise/fatigue and weight loss.  HENT:   Negative for hearing loss and tinnitus.   Eyes:  Negative for blurred vision and double vision.  Respiratory:  Negative for cough, sputum production, shortness of breath and wheezing.   Cardiovascular:  Negative for chest pain, palpitations, orthopnea, claudication, leg swelling and PND.  Gastrointestinal:  Negative for abdominal pain, blood in stool, constipation, diarrhea, heartburn, melena, nausea and vomiting.  Genitourinary: Negative.   Musculoskeletal:  Positive for joint pain (wrists, follows rheumatology). Negative for falls and myalgias.  Skin:  Negative for rash.  Neurological:  Negative for dizziness, tingling, sensory change, weakness and headaches.  Endo/Heme/Allergies:  Positive for environmental allergies (mild). Negative for polydipsia.  Psychiatric/Behavioral: Negative.  Negative for depression, memory loss, substance abuse and suicidal ideas. The patient is not nervous/anxious and does not have insomnia.   All other systems reviewed and are negative.    Objective:     Today's Vitals   09/08/22 1030  BP: 100/62  Pulse: 87  Temp: (!) 97.2 F (36.2 C)  SpO2: 96%  Weight: 206 lb 3.2 oz (93.5 kg)  Height: 5' 2.5" (1.588 m)   Body mass index is 37.11 kg/m.  General Appearance: Well nourished, in no apparent distress. Eyes: PERRLA, EOMs, conjunctiva no swelling or erythema Sinuses: No Frontal/maxillary tenderness ENT/Mouth: Ext aud canals clear, TMs without erythema, bulging. No erythema, swelling, or exudate on post pharynx.  Tonsils not swollen or erythematous. Hearing normal.  Neck: Supple, thyroid normal.  Respiratory: Respiratory effort normal, BS equal bilaterally without rales, rhonchi, wheezing or stridor.  Cardio: RRR with no MRGs. Brisk peripheral pulses without edema.  Abdomen: Soft, + BS.  Non tender, no guarding, rebound, hernias, masses. Lymphatics: Non tender without lymphadenopathy.  Musculoskeletal: Full ROM, 5/5 strength, Mildly antalgic  gait. Skin: Warm, dry; intact without rashes, lesions, ecchynosis Neuro: Cranial nerves intact. No cerebellar symptoms.  Psych: Awake and oriented X 3, normal affect, Insight and Judgment appropriate.    Medicare Attestation I have personally reviewed: The patient's medical and social history Their use of alcohol, tobacco or illicit drugs Their current medications and supplements The patient's functional ability including ADLs,fall risks, home safety risks, cognitive, and hearing and visual impairment Diet and physical activities Evidence for depression or mood disorders  The patient's weight, height, BMI, and visual acuity have been recorded  in the chart.  I have made referrals, counseling, and provided education to the patient based on review of the above and I have provided the patient with a written personalized care Frye for preventive services.     Alycia Rossetti, NP   09/08/2022

## 2022-09-08 ENCOUNTER — Encounter: Payer: Self-pay | Admitting: Nurse Practitioner

## 2022-09-08 ENCOUNTER — Ambulatory Visit (INDEPENDENT_AMBULATORY_CARE_PROVIDER_SITE_OTHER): Payer: Medicare Other | Admitting: Nurse Practitioner

## 2022-09-08 VITALS — BP 100/62 | HR 87 | Temp 97.2°F | Ht 62.5 in | Wt 206.2 lb

## 2022-09-08 DIAGNOSIS — E782 Mixed hyperlipidemia: Secondary | ICD-10-CM

## 2022-09-08 DIAGNOSIS — E039 Hypothyroidism, unspecified: Secondary | ICD-10-CM | POA: Diagnosis not present

## 2022-09-08 DIAGNOSIS — Z0001 Encounter for general adult medical examination with abnormal findings: Secondary | ICD-10-CM | POA: Diagnosis not present

## 2022-09-08 DIAGNOSIS — M0579 Rheumatoid arthritis with rheumatoid factor of multiple sites without organ or systems involvement: Secondary | ICD-10-CM | POA: Diagnosis not present

## 2022-09-08 DIAGNOSIS — Z87891 Personal history of nicotine dependence: Secondary | ICD-10-CM | POA: Diagnosis not present

## 2022-09-08 DIAGNOSIS — I700 Atherosclerosis of aorta: Secondary | ICD-10-CM

## 2022-09-08 DIAGNOSIS — M3500 Sicca syndrome, unspecified: Secondary | ICD-10-CM | POA: Diagnosis not present

## 2022-09-08 DIAGNOSIS — Z0000 Encounter for general adult medical examination without abnormal findings: Secondary | ICD-10-CM

## 2022-09-08 DIAGNOSIS — I2510 Atherosclerotic heart disease of native coronary artery without angina pectoris: Secondary | ICD-10-CM | POA: Diagnosis not present

## 2022-09-08 DIAGNOSIS — I10 Essential (primary) hypertension: Secondary | ICD-10-CM | POA: Diagnosis not present

## 2022-09-08 DIAGNOSIS — L732 Hidradenitis suppurativa: Secondary | ICD-10-CM

## 2022-09-08 DIAGNOSIS — E559 Vitamin D deficiency, unspecified: Secondary | ICD-10-CM | POA: Diagnosis not present

## 2022-09-08 DIAGNOSIS — Z79899 Other long term (current) drug therapy: Secondary | ICD-10-CM | POA: Diagnosis not present

## 2022-09-08 DIAGNOSIS — R6889 Other general symptoms and signs: Secondary | ICD-10-CM

## 2022-09-08 DIAGNOSIS — R7309 Other abnormal glucose: Secondary | ICD-10-CM

## 2022-09-08 DIAGNOSIS — M4722 Other spondylosis with radiculopathy, cervical region: Secondary | ICD-10-CM

## 2022-09-08 DIAGNOSIS — Z9109 Other allergy status, other than to drugs and biological substances: Secondary | ICD-10-CM

## 2022-09-08 MED ORDER — METHOCARBAMOL 1000 MG PO TABS: 1000.0000 mg | ORAL_TABLET | ORAL | 1 refills | Status: AC | PRN

## 2022-09-08 MED ORDER — TRIAMCINOLONE ACETONIDE 55 MCG/ACT NA AERO: 2.0000 | INHALATION_SPRAY | Freq: Two times a day (BID) | NASAL | 2 refills | Status: AC

## 2022-09-08 NOTE — Patient Instructions (Signed)
Fair life protein shakes Eat more frequently - try not to go more than 6 hours without protein Aim for 90 grams of protein a day- 30 breakfast/30 lunch 30 dinner Try to keep net carbs less than 50 Net Carbs=Total Carbs-fiber- sugar alcohols Exercise heartrate 120-140(fat burning zone)- walking 20-30 minutes 4 days a week  

## 2022-09-09 LAB — COMPLETE METABOLIC PANEL WITH GFR
AG Ratio: 1.6 (calc) (ref 1.0–2.5)
ALT: 24 U/L (ref 6–29)
AST: 20 U/L (ref 10–35)
Albumin: 4.3 g/dL (ref 3.6–5.1)
Alkaline phosphatase (APISO): 80 U/L (ref 37–153)
BUN: 14 mg/dL (ref 7–25)
CO2: 30 mmol/L (ref 20–32)
Calcium: 9.7 mg/dL (ref 8.6–10.4)
Chloride: 102 mmol/L (ref 98–110)
Creat: 0.68 mg/dL (ref 0.50–1.05)
Globulin: 2.7 g/dL (calc) (ref 1.9–3.7)
Glucose, Bld: 97 mg/dL (ref 65–99)
Potassium: 4.7 mmol/L (ref 3.5–5.3)
Sodium: 139 mmol/L (ref 135–146)
Total Bilirubin: 0.4 mg/dL (ref 0.2–1.2)
Total Protein: 7 g/dL (ref 6.1–8.1)
eGFR: 99 mL/min/{1.73_m2} (ref >=60)

## 2022-09-09 LAB — CBC WITH DIFFERENTIAL/PLATELET
Absolute Monocytes: 371 cells/uL (ref 200–950)
Basophils Absolute: 29 cells/uL (ref 0–200)
Basophils Relative: 0.5 %
Eosinophils Absolute: 63 cells/uL (ref 15–500)
Eosinophils Relative: 1.1 %
HCT: 38.4 % (ref 35.0–45.0)
Hemoglobin: 12.7 g/dL (ref 11.7–15.5)
Lymphs Abs: 3221 cells/uL (ref 850–3900)
MCH: 29.3 pg (ref 27.0–33.0)
MCHC: 33.1 g/dL (ref 32.0–36.0)
MCV: 88.5 fL (ref 80.0–100.0)
MPV: 10.2 fL (ref 7.5–12.5)
Monocytes Relative: 6.5 %
Neutro Abs: 2018 cells/uL (ref 1500–7800)
Neutrophils Relative %: 35.4 %
Platelets: 308 10*3/uL (ref 140–400)
RBC: 4.34 10*6/uL (ref 3.80–5.10)
RDW: 13.1 % (ref 11.0–15.0)
Total Lymphocyte: 56.5 %
WBC: 5.7 10*3/uL (ref 3.8–10.8)

## 2022-09-09 LAB — TSH: TSH: 5.11 mIU/L — ABNORMAL HIGH (ref 0.40–4.50)

## 2022-09-09 LAB — LIPID PANEL
Cholesterol: 206 mg/dL — ABNORMAL HIGH (ref <200)
HDL: 98 mg/dL (ref >=50)
LDL Cholesterol (Calc): 90 mg/dL (calc)
Non-HDL Cholesterol (Calc): 108 mg/dL (calc) (ref <130)
Total CHOL/HDL Ratio: 2.1 (calc) (ref <5.0)
Triglycerides: 88 mg/dL (ref <150)

## 2022-09-16 ENCOUNTER — Other Ambulatory Visit (HOSPITAL_COMMUNITY): Payer: Self-pay

## 2022-10-05 ENCOUNTER — Other Ambulatory Visit: Payer: Self-pay | Admitting: Internal Medicine

## 2022-10-05 DIAGNOSIS — M0609 Rheumatoid arthritis without rheumatoid factor, multiple sites: Secondary | ICD-10-CM | POA: Diagnosis not present

## 2022-10-11 DIAGNOSIS — H2513 Age-related nuclear cataract, bilateral: Secondary | ICD-10-CM | POA: Diagnosis not present

## 2022-10-11 DIAGNOSIS — H2512 Age-related nuclear cataract, left eye: Secondary | ICD-10-CM | POA: Diagnosis not present

## 2022-10-15 ENCOUNTER — Other Ambulatory Visit: Payer: Self-pay | Admitting: Internal Medicine

## 2022-10-15 ENCOUNTER — Ambulatory Visit
Admission: RE | Admit: 2022-10-15 | Discharge: 2022-10-15 | Disposition: A | Discharge status: Home / Self Care | Payer: Medicare Other | Source: Ambulatory Visit | Attending: Internal Medicine | Admitting: Internal Medicine

## 2022-10-15 ENCOUNTER — Ambulatory Visit
Admission: RE | Admit: 2022-10-15 | Discharge: 2022-10-15 | Disposition: A | Discharge status: Home / Self Care | Payer: Medicare Other | Source: Ambulatory Visit | Attending: Nurse Practitioner | Admitting: Nurse Practitioner

## 2022-10-15 DIAGNOSIS — Z87891 Personal history of nicotine dependence: Secondary | ICD-10-CM | POA: Diagnosis not present

## 2022-10-15 DIAGNOSIS — Z1231 Encounter for screening mammogram for malignant neoplasm of breast: Secondary | ICD-10-CM | POA: Diagnosis not present

## 2022-10-20 ENCOUNTER — Other Ambulatory Visit: Payer: Self-pay | Admitting: Nurse Practitioner

## 2022-10-20 DIAGNOSIS — E782 Mixed hyperlipidemia: Secondary | ICD-10-CM

## 2022-11-09 ENCOUNTER — Telehealth: Payer: Self-pay

## 2022-11-09 NOTE — Progress Notes (Signed)
Care Plan Name: Autumn Frye  DOB: 2061-09-05  [facility] starting care plan for CPP review.  Care plan complete, FWD to CPP for review.   Total time spent: 14 min

## 2022-11-09 NOTE — Progress Notes (Signed)
Name: Autumn Frye DOB: Jan 14, 1961 Date: 11/09/22   [facility] reviewing pt's chart prior to making general assessment call. Reviewing OV, consults, medications, labs, and hospital visits. Chart review complete. HC spoke with pt who is doing well overall. Over the last 3 weeks pt has been having concerns about right hip and ankle pain. Pt plans to make appt with PCP for referral to PT. Since last visit pt has not had ED/hospitalizations, or new health concerns beside above. Pt has been taking medications as prescribed and has not experienced any new or concerning side effects. Pt has not run out of any of her prescribed meds. Pt has not had falls, or increase/uncontrolled pain. HC scheduled pt for FPO on 01/04/23. No further questions or concerns at this time. FWD to CPP for review.   Total Time Spent: 17 min

## 2022-11-26 DIAGNOSIS — H2513 Age-related nuclear cataract, bilateral: Secondary | ICD-10-CM | POA: Diagnosis not present

## 2022-11-26 DIAGNOSIS — I10 Essential (primary) hypertension: Secondary | ICD-10-CM | POA: Diagnosis not present

## 2022-11-30 DIAGNOSIS — M0609 Rheumatoid arthritis without rheumatoid factor, multiple sites: Secondary | ICD-10-CM | POA: Diagnosis not present

## 2022-12-01 DIAGNOSIS — M0609 Rheumatoid arthritis without rheumatoid factor, multiple sites: Secondary | ICD-10-CM | POA: Diagnosis not present

## 2022-12-01 DIAGNOSIS — M797 Fibromyalgia: Secondary | ICD-10-CM | POA: Diagnosis not present

## 2022-12-01 DIAGNOSIS — M25551 Pain in right hip: Secondary | ICD-10-CM | POA: Diagnosis not present

## 2022-12-01 DIAGNOSIS — M549 Dorsalgia, unspecified: Secondary | ICD-10-CM | POA: Diagnosis not present

## 2022-12-01 DIAGNOSIS — M25572 Pain in left ankle and joints of left foot: Secondary | ICD-10-CM | POA: Diagnosis not present

## 2022-12-01 DIAGNOSIS — M25571 Pain in right ankle and joints of right foot: Secondary | ICD-10-CM | POA: Diagnosis not present

## 2022-12-01 DIAGNOSIS — M7060 Trochanteric bursitis, unspecified hip: Secondary | ICD-10-CM | POA: Diagnosis not present

## 2022-12-01 DIAGNOSIS — M1990 Unspecified osteoarthritis, unspecified site: Secondary | ICD-10-CM | POA: Diagnosis not present

## 2022-12-01 DIAGNOSIS — M79671 Pain in right foot: Secondary | ICD-10-CM | POA: Diagnosis not present

## 2022-12-01 DIAGNOSIS — Z79899 Other long term (current) drug therapy: Secondary | ICD-10-CM | POA: Diagnosis not present

## 2022-12-01 DIAGNOSIS — E039 Hypothyroidism, unspecified: Secondary | ICD-10-CM | POA: Diagnosis not present

## 2022-12-01 DIAGNOSIS — M130 Polyarthritis, unspecified: Secondary | ICD-10-CM | POA: Diagnosis not present

## 2022-12-01 DIAGNOSIS — M79672 Pain in left foot: Secondary | ICD-10-CM | POA: Diagnosis not present

## 2022-12-01 DIAGNOSIS — H04123 Dry eye syndrome of bilateral lacrimal glands: Secondary | ICD-10-CM | POA: Diagnosis not present

## 2022-12-01 DIAGNOSIS — M25552 Pain in left hip: Secondary | ICD-10-CM | POA: Diagnosis not present

## 2022-12-07 ENCOUNTER — Encounter: Payer: Self-pay | Admitting: Internal Medicine

## 2022-12-08 ENCOUNTER — Ambulatory Visit: Payer: Medicare Other | Admitting: Nurse Practitioner

## 2022-12-08 ENCOUNTER — Ambulatory Visit (INDEPENDENT_AMBULATORY_CARE_PROVIDER_SITE_OTHER): Payer: Medicare Other | Admitting: Internal Medicine

## 2022-12-08 ENCOUNTER — Encounter: Payer: Self-pay | Admitting: Internal Medicine

## 2022-12-08 VITALS — BP 120/70 | HR 96 | Temp 98.0°F | Resp 16 | Ht 62.5 in | Wt 208.8 lb

## 2022-12-08 DIAGNOSIS — Z79899 Other long term (current) drug therapy: Secondary | ICD-10-CM | POA: Diagnosis not present

## 2022-12-08 DIAGNOSIS — E782 Mixed hyperlipidemia: Secondary | ICD-10-CM

## 2022-12-08 DIAGNOSIS — M0579 Rheumatoid arthritis with rheumatoid factor of multiple sites without organ or systems involvement: Secondary | ICD-10-CM

## 2022-12-08 DIAGNOSIS — I10 Essential (primary) hypertension: Secondary | ICD-10-CM

## 2022-12-08 DIAGNOSIS — R300 Dysuria: Secondary | ICD-10-CM | POA: Diagnosis not present

## 2022-12-08 DIAGNOSIS — E039 Hypothyroidism, unspecified: Secondary | ICD-10-CM

## 2022-12-08 DIAGNOSIS — R7309 Other abnormal glucose: Secondary | ICD-10-CM | POA: Diagnosis not present

## 2022-12-08 DIAGNOSIS — I2510 Atherosclerotic heart disease of native coronary artery without angina pectoris: Secondary | ICD-10-CM | POA: Diagnosis not present

## 2022-12-08 DIAGNOSIS — I700 Atherosclerosis of aorta: Secondary | ICD-10-CM

## 2022-12-08 DIAGNOSIS — E559 Vitamin D deficiency, unspecified: Secondary | ICD-10-CM | POA: Diagnosis not present

## 2022-12-08 NOTE — Progress Notes (Signed)
Future Appointments  Date Time Provider Department  06/13/2023  cpe 11:00 AM Unk Pinto, MD [facility]  03/08/2024 11:00 AM Alycia Rossetti, NP [facility]    History of Present Illness:       This very nice 62 y.o. MWF presents for 6 month follow up with HTN, ASCAD, Rheumatoid Arthritis, HLD, Prediabetes, Hypothyroidism, Morbid Obesity and Vitamin D Deficiency .       In May 2021 she had a LD Screening Chest CT scan. Former smoker, 40 pack year history, quit in 2015.  On Jul 07, 2021 she had a 2sd Neg LD Screening Chest CT for Lung cancer which also showed  CAD AS with mild LAD & Cx calcifications & Thoracic Aortic Atherosclerosis.  She was referred to Dr Cathie Olden who recommended an exercise Nuclear Stress which was interpreted  Normal & Low Risk.          Patient is treated for HTN  since  & BP has been controlled at home. Today's BP is at goal - 120/70. Patient has had no complaints of any cardiac type chest pain, palpitations, dyspnea / orthopnea / PND, dizziness, claudication, or dependent edema.                                                     Patient has been on SS Disability for Rheumatoid Arthritis since 2009 and she is followed by Dr Dossie Der  on [place].  She has c/o chronic neck & back pain attributed to RA & DDD. Patient also is on Doxycycline for Hidradenitis.         Hyperlipidemia is controlled with diet & Rosuvastatin /Repatha . Patient denies myalgias or other med SE's. Last Lipids were at goal with high good HDL Chol of 98 and LDL 90 :  Lab Results  Component Value Date   CHOL 206 (H) 09/08/2022   HDL 98 09/08/2022   LDLCALC 90 09/08/2022   TRIG 88 09/08/2022   CHOLHDL 2.1 09/08/2022       Patient has hx/o Morbid Obesity (BMI 36.7+)  and Prediabetes (A1c 5.9% /2010) and has had no symptoms of reactive hypoglycemia, diabetic polys, paresthesias or visual blurring.  In 2019, patient  was started on Metformin by Dr Suzette Battiest for Insulin Resistance. Last A1c was at  goal :  Lab Results  Component Value Date   HGBA1C 5.5 06/08/2022            In 1995, patient had a Thyroidectomy for thyroid cancer and has been on replacement /suppressive therapy since managed by Dr Suzette Battiest. 4 of her last 5 TSH's appear over suppressed (last was 0.25 in August).                                                        Further, the patient also has history of Vitamin D Deficiency ("27" /2009 & "37" /2016)   and supplements vitamin D . Last vitamin D was at goal :  Lab Results  Component Value Date   VD25OH 72 06/08/2022     Current Outpatient Medications on File Prior to Visit  Medication Sig   acetaminophen 500 MG tablet Take 1 tablet  every 6  hours as needed.   Acetylcysteine  Take 1 capsule  daily.   albuterol (2.5 MG/3ML) 0.083% neb  soln Take 3 mLs (2.5 mg total) by neb every 6  hours as needed f   albuterol  HFA  inhaler 2 PUFFS EVERY 4 HOURS  AS NEEDED    amitriptyline  25 MG tablet TAKE 1 TABLET  MORNING AND 3 TABS BEDTIME    ASPIRIN 81 PO Take by mouth daily.   Black Pepper-Turmeric  01-999 MG CAPS Takes 1 capsule  Daily   VITAMIN D 5000 u Take 10,000 Units  daily.    CRANBERRY P Take 1 tablet  2 (two) times daily.   diclofenac  1 % GEL Apply 2 g topically as needed (inflamation).   REPATHA SURECLICK  [id number] MG/ML  Inject 1 mL into the skin every 14 (fourteen) days.   Eyelid Cleansers (AVENOVA) 0.01 % SOLN Apply to eyelids BID   fexofenadine 180 MG tablet Take  daily as needed.   BREO ELLIPTA  100-25  INHALE 1 PUFF  DAILY.   furosemide 40 MG tablet Take 1 tablet  once a week.   SIMPONI ARIA IV Inject 2 mg/kg into the vein every 8 (eight) weeks.   levothyroxine 100 MCG tablet TAKE 1 TABLET  DAILY   LOTEMAX 0.5 % GEL 1 DROP IN BOTH EYES THREE TIMES DAILY   Magnesium Oxide 500 MG TABS Take 1,000 mg daily.   Methocarbamol 1000 MG TABS Take 1,000 mg as needed.   MILK THISTLE EXTRACT  Take 2 capsules  daily.   montelukast  10 MG tablet TAKE 1 TABLET DAILY    olmesartan 20 MG tablet TAKE 1 TABLET DAILY FOR BLOOD PRESSURE   Omega-3 FISH OIL 1200 MG CAPS Take 2 capsules  daily.    Polyethyl Glycoln 0.4-0.3 % SOLN Place 1 drop into both eyes 4 (four) times daily.    RESTASIS 0.05 % ophthalmic emulsion Place 1 drop into both eyes daily.    rosuvastatin 40 MG tablet TAKE 1 TABLET DAILY    NASACORT  nasal inhaler Place 2 sprays into the nose 2 (two) times daily.   vitamin B-12 1000 MCG tablet Take  daily.     Allergies  Allergen Reactions   Ciprofloxacin Hives and Swelling    SWELLING REACTION UNSPECIFIED    Levofloxacin Hives and Other (See Comments)   Lyrica [Pregabalin] Other (See Comments)    Altered mental status ALTERED MENTAL STATUS HALLUCINATIONS   Compazine [Prochlorperazine Edisylate]     UNSPECIFIED REACTION    Gabapentin Other (See Comments)    Diarrhea and nausea   Sulfamethoxazole-Trimethoprim Other (See Comments)    itch   Xeljanz [Tofacitinib] Hives   Zetia [Ezetimibe]     Stomach cramping   Hydrocodone Itching   Morphine And Related Itching   Penicillins Itching   Sulfa Antibiotics Itching     PMHx:   Past Medical History:  Diagnosis Date   Arthritis    OA, RA   Asthma    with a cold   Autoimmune deficiency syndrome (HCC)    Back pain    Cancer (HCC)    thyroid-1995   Family history of adverse reaction to anesthesia    mother problems with n/v   Fibromyalgia    GERD (gastroesophageal reflux disease)    Hepatitis    'A" when pt. was 12 yrs. old   Hiatal hernia    Hyperlipidemia    Hypertension    Hypothyroidism  Pneumonia    S/P total knee arthroplasty 02/16/2016     Immunization History  Administered Date(s) Administered   DTaP 09/15/2012, 09/15/2012   Influenza Inj Mdck Quad  08/02/2017   Influenza  09/06/2012   Influenza 05/07/2014, 05/20/2016, 08/02/2017   Pneumococcal -13 07/05/2014   Pneumococcal -23 03/02/2021   Zoster, Live 09/03/2013     Past Surgical History:  Procedure  Laterality Date   ANTERIOR CERVICAL DECOMP/DISCECTOMY FUSION N/A 05/30/2017   Procedure: Cervical five-six, Cervical six-seven Anterior discectomy with fusion and plate fixation;  Surgeon: Ditty, Kevan Ny, MD;  Location: [place];  Service: Neurosurgery;  Laterality: N/A;   ANTERIOR LAT LUMBAR FUSION Left 01/26/2013   Procedure: ANTERIOR LATERAL LUMBAR FUSION 1 LEVEL;  Surgeon: Faythe Ghee, MD;  Location: [facility];  Service: Neurosurgery;  Laterality: Left;  lumbar four-five   KNEE ARTHROPLASTY Left 02/16/2016   Procedure: COMPUTER ASSISTED TOTAL KNEE ARTHROPLASTY;  Surgeon: Dereck Leep, MD;  Location: [facility];  Service: Orthopedics;  Laterality: Left;   KNEE SURGERY     3 on right knee, 1 on left-prior to TKR   LUMBAR PERCUTANEOUS PEDICLE SCREW 1 LEVEL Left 01/26/2013   Procedure: LUMBAR PERCUTANEOUS PEDICLE SCREW 1 LEVEL;  Surgeon: Faythe Ghee, MD;  Location: [place];  Service: Neurosurgery;  Laterality: Left;  lumbar four-five   PILONIDAL CYST EXCISION     THYROIDECTOMY     TONSILLECTOMY     TOTAL KNEE ARTHROPLASTY     right   UPPER GASTROINTESTINAL ENDOSCOPY     VAGINAL HYSTERECTOMY       FHx:    Reviewed / unchanged   SHx:    Reviewed / unchanged    Systems Review:  Constitutional: Denies fever, chills, wt changes, headaches, insomnia, fatigue, night sweats, change in appetite. Eyes: Denies redness, blurred vision, diplopia, discharge, itchy, watery eyes.  ENT: Denies discharge, congestion, post nasal drip, epistaxis, sore throat, earache, hearing loss, dental pain, tinnitus, vertigo, sinus pain, snoring.  CV: Denies chest pain, palpitations, irregular heartbeat, syncope, dyspnea, diaphoresis, orthopnea, PND, claudication or edema. Respiratory: denies cough, dyspnea, DOE, pleurisy, hoarseness, laryngitis, wheezing.  Gastrointestinal: Denies dysphagia, odynophagia, heartburn, reflux, water brash, abdominal pain or cramps, nausea, vomiting, bloating, diarrhea,  constipation, hematemesis, melena, hematochezia  or hemorrhoids. Genitourinary: Denies dysuria, frequency, urgency, nocturia, hesitancy, discharge, hematuria or flank pain. Musculoskeletal: Denies arthralgias, myalgias, stiffness, jt. swelling, pain, limping or strain/sprain.  Skin: Denies pruritus, rash, hives, warts, acne, eczema or change in skin lesion(s). Neuro: No weakness, tremor, incoordination, spasms, paresthesia or pain. Psychiatric: Denies confusion, memory loss or sensory loss. Endo: Denies change in weight, skin or hair change.  Heme/Lymph: No excessive bleeding, bruising or enlarged lymph nodes.   Physical Exam  BP 120/70   Pulse 96   Temp 98 F (36.7 C)   Resp 16   Ht 5' 2.5" (1.588 m)   Wt 208 lb 12.8 oz (94.7 kg)   LMP  (LMP Unknown)   SpO2 98%   BMI 37.58 kg/m   Appears  well nourished, well groomed  and in no distress.  Eyes: PERRLA, EOMs, conjunctiva no swelling or erythema. Sinuses: No frontal/maxillary tenderness ENT/Mouth: EAC's clear, TM's nl w/o erythema, bulging. Nares clear w/o erythema, swelling, exudates. Oropharynx clear without erythema or exudates. Oral hygiene is good. Tongue normal, non obstructing. Hearing intact.  Neck: Supple. Thyroid not palpable. Car 2+/2+ without bruits, nodes or JVD. Chest: Respirations nl with BS clear & equal w/o rales, rhonchi, wheezing or  stridor.  Cor: Heart sounds normal w/ regular rate and rhythm without sig. murmurs, gallops, clicks or rubs. Peripheral pulses normal and equal  without edema.  Abdomen: Soft & bowel sounds normal. Non-tender w/o guarding, rebound, hernias, masses or organomegaly.  Lymphatics: Unremarkable.  Musculoskeletal: Full ROM all peripheral extremities, joint stability, 5/5 strength and normal gait.  Skin: Warm, dry without exposed rashes, lesions or ecchymosis apparent.  Neuro: Cranial nerves intact, reflexes equal bilaterally. Sensory-motor testing grossly intact. Tendon reflexes grossly  intact.  Pysch: Alert & oriented x 3.  Insight and judgement nl & appropriate. No ideations.   Assessment and Plan:  1. Essential hypertension  - Continue medication, monitor blood pressure at home.  - Continue DASH diet.  Reminder to go to the ER if any CP,  SOB, nausea, dizziness, severe HA, changes vision/speech.    - CBC with Differential/Platelet - COMPLETE METABOLIC PANEL WITH GFR - Magnesium - TSH  2. Hyperlipidemia, mixed  - Continue diet/meds, exercise,& lifestyle modifications.  - Continue monitor periodic cholesterol/liver & renal functions     - Lipid panel - TSH  3. Abnormal glucose  - Continue diet, exercise  - Lifestyle modifications.  - Monitor appropriate labs    - Hemoglobin A1c - Insulin, random  4. Hypothyroidism, unspecified type  - TSH  5. Vitamin D deficiency  - Continue supplementation   - VITAMIN D 25 Hydroxy   6. CAD in native artery  - Lipid panel  7. Aortic atherosclerosis ([place]) by Chest CT scan  May 2021  - Lipid panel  8. Rheumatoid arthritis involving multiple sites with positive rheumatoid factor   9. Medication management  - CBC with Differential/Platelet - COMPLETE METABOLIC PANEL WITH GFR - Magnesium - Lipid panel - TSH - Hemoglobin A1c - Insulin, random - VITAMIN D 25 Hydroxy  10. Dysuria  - Urinalysis, Routine w reflex microscopic - Urine Culture          Discussed  regular exercise, BP monitoring, weight control to achieve/maintain BMI less than 25 and discussed med and SE's. Recommended labs to assess /monitor clinical status .  I discussed the assessment and treatment plan with the patient. The patient was provided an opportunity to ask questions and all were answered. The patient agreed with the plan and demonstrated an understanding of the instructions.  I provided over 30 minutes of exam, counseling, chart review and  complex critical decision making.        The patient was advised to call back or  seek an in-person evaluation if the symptoms worsen or if the condition fails to improve as anticipated.   Kirtland Bouchard, MD

## 2022-12-08 NOTE — Patient Instructions (Signed)

## 2022-12-09 ENCOUNTER — Other Ambulatory Visit: Payer: Self-pay | Admitting: Cardiovascular Disease

## 2022-12-09 ENCOUNTER — Other Ambulatory Visit: Payer: Self-pay | Admitting: Internal Medicine

## 2022-12-09 ENCOUNTER — Encounter: Payer: Self-pay | Admitting: Internal Medicine

## 2022-12-09 DIAGNOSIS — E039 Hypothyroidism, unspecified: Secondary | ICD-10-CM

## 2022-12-09 LAB — CBC WITH DIFFERENTIAL/PLATELET
Absolute Monocytes: 362 cells/uL (ref 200–950)
Basophils Absolute: 21 cells/uL (ref 0–200)
Basophils Relative: 0.3 %
Eosinophils Absolute: 7 cells/uL — ABNORMAL LOW (ref 15–500)
Eosinophils Relative: 0.1 %
HCT: 43.4 % (ref 35.0–45.0)
Hemoglobin: 14.2 g/dL (ref 11.7–15.5)
Lymphs Abs: 3400.9 cells/uL (ref 850–3900)
MCH: 29 pg (ref 27.0–33.0)
MCHC: 32.7 g/dL (ref 32.0–36.0)
MCV: 88.6 fL (ref 80.0–100.0)
MPV: 10.2 fL (ref 7.5–12.5)
Monocytes Relative: 5.1 %
Neutro Abs: 3309 cells/uL (ref 1500–7800)
Neutrophils Relative %: 46.6 %
Platelets: 345 10*3/uL (ref 140–400)
RBC: 4.9 10*6/uL (ref 3.80–5.10)
RDW: 12.6 % (ref 11.0–15.0)
Total Lymphocyte: 47.9 %
WBC: 7.1 10*3/uL (ref 3.8–10.8)

## 2022-12-09 LAB — URINALYSIS, ROUTINE W REFLEX MICROSCOPIC
Bacteria, UA: NONE SEEN /HPF
Bilirubin Urine: NEGATIVE
Glucose, UA: NEGATIVE
Hyaline Cast: NONE SEEN /LPF
Ketones, ur: NEGATIVE
Leukocytes,Ua: NEGATIVE
Nitrite: NEGATIVE
Protein, ur: NEGATIVE
RBC / HPF: NONE SEEN /HPF (ref 0–2)
Specific Gravity, Urine: 1.007 (ref 1.001–1.035)
Squamous Epithelial / HPF: NONE SEEN /HPF (ref <=5)
WBC, UA: NONE SEEN /HPF (ref 0–5)
pH: 7.5 (ref 5.0–8.0)

## 2022-12-09 LAB — COMPLETE METABOLIC PANEL WITH GFR
AG Ratio: 1.5 (calc) (ref 1.0–2.5)
ALT: 24 U/L (ref 6–29)
AST: 22 U/L (ref 10–35)
Albumin: 4.8 g/dL (ref 3.6–5.1)
Alkaline phosphatase (APISO): 83 U/L (ref 37–153)
BUN: 18 mg/dL (ref 7–25)
CO2: 32 mmol/L (ref 20–32)
Calcium: 10.4 mg/dL (ref 8.6–10.4)
Chloride: 101 mmol/L (ref 98–110)
Creat: 0.75 mg/dL (ref 0.50–1.05)
Globulin: 3.1 g/dL (calc) (ref 1.9–3.7)
Glucose, Bld: 91 mg/dL (ref 65–99)
Potassium: 5.1 mmol/L (ref 3.5–5.3)
Sodium: 140 mmol/L (ref 135–146)
Total Bilirubin: 0.4 mg/dL (ref 0.2–1.2)
Total Protein: 7.9 g/dL (ref 6.1–8.1)
eGFR: 91 mL/min/{1.73_m2} (ref >=60)

## 2022-12-09 LAB — URINE CULTURE
MICRO NUMBER:: 14778453
Result:: NO GROWTH
SPECIMEN QUALITY:: ADEQUATE

## 2022-12-09 LAB — MAGNESIUM: Magnesium: 2.3 mg/dL (ref 1.5–2.5)

## 2022-12-09 LAB — VITAMIN D 25 HYDROXY (VIT D DEFICIENCY, FRACTURES): Vit D, 25-Hydroxy: 65 ng/mL (ref 30–100)

## 2022-12-09 LAB — HEMOGLOBIN A1C
Hgb A1c MFr Bld: 5.7 % of total Hgb — ABNORMAL HIGH (ref <5.7)
Mean Plasma Glucose: 117 mg/dL
eAG (mmol/L): 6.5 mmol/L

## 2022-12-09 LAB — LIPID PANEL
Cholesterol: 203 mg/dL — ABNORMAL HIGH (ref <200)
HDL: 124 mg/dL (ref >=50)
LDL Cholesterol (Calc): 66 mg/dL (calc)
Non-HDL Cholesterol (Calc): 79 mg/dL (calc) (ref <130)
Total CHOL/HDL Ratio: 1.6 (calc) (ref <5.0)
Triglycerides: 54 mg/dL (ref <150)

## 2022-12-09 LAB — INSULIN, RANDOM: Insulin: 11.6 u[IU]/mL

## 2022-12-09 LAB — TSH: TSH: 9.62 mIU/L — ABNORMAL HIGH (ref 0.40–4.50)

## 2022-12-09 LAB — MICROSCOPIC MESSAGE

## 2022-12-09 MED ORDER — LEVOTHYROXINE SODIUM 125 MCG PO TABS: ORAL_TABLET | ORAL | 1 refills | Status: DC

## 2022-12-09 NOTE — Progress Notes (Signed)
<><><><><><><><><><><><><><><><><><><><><><><><><><><><><><><><><> <><><><><><><><><><><><><><><><><><><><><><><><><><><><><><><><><> -   Test results slightly outside the reference range are not unusual. If there is anything important, I will review this with you,  otherwise it is considered normal test values.  If you have further questions,  please do not hesitate to contact me at the office or via My Chart.  <><><><><><><><><><><><><><><><><><><><><><><><><><><><><><><><><> <><><><><><><><><><><><><><><><><><><><><><><><><><><><><><><><><>  -  Chol 203  borderline elevated,   But OK since have such a "Super high" elevated "Good HDL" of 124  & the "Bad LDL Chol " = 66 is Low - Terrific !  <><><><><><><><><><><><><><><><><><><><><><><><><><><><><><><><><>  -  TSH is elevated , which means your thyroid hormone level is low,   So need to increase your dose  <><><><><><><><><><><><><><><><><><><><><><><><><><><><><><><><><>  -  A1c = 5.7% means Blood sugar  is  elevated in the borderline and                                                           early or pre-diabetes range which has the same   300% increased risk for heart attack, stroke, cancer and                                            alzheimer- type vascular dementia as full blown diabetes.   But the good news is that diet, exercise with                                                     weight loss can cure the early diabetes at this point. <><><><><><><><><><><><><><><><><><><><><><><><><><><><><><><><><>  -  Vitamin D = 65 - Excellent - Please keep dosage same  <><><><><><><><><><><><><><><><><><><><><><><><><><><><><><><><><>  -  All Else - CBC - Kidneys - Electrolytes - Liver - Magnesium  - all  Normal / OK  - Urine culture still  pending <><><><><><><><><><><><><><><><><><><><><><><><><><><><><><><><><> <><><><><><><><><><><><><><><><><><><><><><><><><><><><><><><><><>

## 2022-12-28 ENCOUNTER — Ambulatory Visit: Payer: Self-pay | Admitting: Pharmacist

## 2022-12-28 DIAGNOSIS — E039 Hypothyroidism, unspecified: Secondary | ICD-10-CM

## 2022-12-28 DIAGNOSIS — I2510 Atherosclerotic heart disease of native coronary artery without angina pectoris: Secondary | ICD-10-CM

## 2022-12-28 DIAGNOSIS — Z79899 Other long term (current) drug therapy: Secondary | ICD-10-CM

## 2022-12-28 DIAGNOSIS — E559 Vitamin D deficiency, unspecified: Secondary | ICD-10-CM

## 2022-12-28 DIAGNOSIS — E782 Mixed hyperlipidemia: Secondary | ICD-10-CM

## 2022-12-28 DIAGNOSIS — I10 Essential (primary) hypertension: Secondary | ICD-10-CM

## 2022-12-28 NOTE — Progress Notes (Signed)
CP Care Plan  Frye,Autumn  61 years, Female  DOB: 1960-12-23  M:   ____________________________________________ General Information Details Chronic Conditions: Hypertension (HTN), Cardiovascular Disease (CVD), Asthma, Hypothyroidism, Hyperlipidemia/Dyslipidemia (HLD), Other Details: Vitamin D Deficiency  Contact your clinical pharmacist and care team with any questions or concerns. Team Phone #:: [phone] Clinical Pharmacist:: Carmelina Dane [facility] Concierge:: Mauro Kaufmann Care Plan High Blood Pressure GOAL: Maintain Blood Pressure less than: 130/80 Your most recent BP is: 100/62 taken on: 09/08/2022 Patient education:: Recommend following the DASH diet, which emphasizes fruits and vegetables and low-fat dairy products along with whole grains, fish, poultry, and nuts. Reduce red meats and sugars., Recommend limiting the daily amount of salt intake to less than /per day or 2/3 teaspoonful a day, Discussed ways to increase movement., Discussed increasing exercise (walking, biking, swimming) to a goal of 30 minutes per day, as able based on current activity level and health and/or as directed by your healthcare provider., Educated patient to call PCP office if you develop episodes of low blood pressure, dizziness, falls or increased headaches, and/or swelling of legs and feet. Discussed checking and recording home blood pressure readings: Daily Your current blood pressure medications are:: Olmesartan  - 1 tab once daily High Cholesterol GOAL:: Maintain triglycerides less than 150, Maintain LDL (bad) cholesterol less than 100 Your most recent LDL level is: 90 Your most recent triglycerides level is: 88 taken on: 09/08/2022 Patient education:: Discussed increasing exercise (walking, biking, swimming) to a goal of 30 minutes per day, as able based on current activity level and health and/or as directed by your healthcare provider., Discussed how a diet high in  fruits/vegetables/nuts/whole grains/beans may help to reduce your cholesterol. Increasing soluble fiber intake (whole grains, fruits, vegetables, beans, nuts and seeds). Recommended avoiding sugary foods and trans fat, limiting carbohydrates, and reducing portion sizes. Recommended Patient to incorporate more healthy fats (salmon, cold-water fish, almonds, walnuts) into their diet. Your current cholesterol medications are::  Rosuvastatin  - 1 tab once daily Repatha /ml - inject 1ml into the skin every 14 days Omega-3 Fatty Acids 1,200mg  - 2 caps once daily  Asthma GOAL: Prevent: Worsening shortness of breath, Hospitalizations Your maintenance inhaler should be used regularly to control symptoms. Your maintenance inhaler is: Breo 100-25 Your rescue inhaler should be used when your symptoms are worsening or not controlled with rest. Your rescue inhaler is: Ventolin Patient education:: Discussed with patient the proper use of the daily maintenance inhaler and rescue inhaler. Call your pharmacist if you have questions on how to use this., Continue using your maintenance inhaler as prescribed every day to help prevent decline in breathing., Continue using  your rescue inhaler as prescribed when you have symptoms of cough, wheezing, and/or shortness of breath.  If you notice you are using your rescue inhaler more often, call your pharmacist/provider., Rinse mouth after using your maintenance inhaler Your current asthma medications are::  albuterol (PROVENTIL) (2.5 MG/3ML) 0.083% nebulizer solution - by nebulization every 6 hours as needed albuterol (VENTOLIN HFA) 108(90base) MCG/ACT inhaler - 2 puffs (spaced 15 mins apart) every 4 hours as needed Breo (fluticasone furoate-vilanterol) 100/25 MCG/ACT - 1 puff once daily  Cardiovascular Disease (CVD) GOAL:: Slow progression of atherosclerosis (plaques / blockages) throughout your body to reduce risk of heart attack and strokes Patient  education:: Discussed  the importance of their prescribed statin therapy. Though you cannot feel the effects of the statin, they work to reduce cholesterol and reduce the risk of future heart attack  and stroke., Recommend contacting 911 if you have sudden one-sided weakness in the face, arm or leg; trouble speaking or understanding someone else talking, and/or facial droop., Recommend contacting 911 if you have sudden severe chest pain and/or tightness, left arm pain and/or shortness of breath., Discussed with patient importance of healthy lifestyle behaviors, maintain healthy weight, limiting alcohol intake, low-fat and low-sodium diet, avoiding tobacco use, managing stress, and physical activity, Discussed importance of maintaining good cholesterol levels, targeting a bad cholesterol level, or LDL, of less than 70, Discussed importance of blood pressure control, with a goal BP of less than 130/80 Your current cardiovascular medications are:: Rosuvastatin  - 1 tablet daily Omega 3 fatty acids  - 2 capsules daily  Hypothyroidism GOAL:: Maintain TSH levels within target range of 0.450 and 4.500 Patient education:: Recommend taking levothyroxine first thing in the morning before eating any food or drinking anything other than water. Take with a full glass of water and wait at least 30 minutes before eating for the day. Separate from vitamins by at least 4 hours. Your current thyroid medications are:: Levothyroxine - 1 tab once daily on an empty stomach  Other Condition Chronic Condition:: Vitamin D Deficiency   GOAL:: Vitamin D level of at least 60 Patient education:: Take 10,000 units of Vitamin D daily  Other Needs Recommended Preventative Health Care:: Recommend scheduling Annual Wellness Visit, Recommend breast cancer screening, Recommend yearly flu vaccine

## 2023-01-10 ENCOUNTER — Telehealth: Payer: Self-pay | Admitting: Pharmacist

## 2023-01-10 NOTE — Progress Notes (Signed)
HC reviewing pts chart prior to general review call. Reviewing office visits, consults, hospital visits, lab and medication adherence/changes. Patient states right hip still causing her pain and her rheumatologist gave her a joint injection in the right hip and it has not helped. She is unable to lay on her sides only able to sleep on her back. States she has been able to get in the water lately and that has helped some. Pt states she doing well overall aside from her right hip pain. Patient is not having any other health concerns and no issues, concerns, or side effects from her medications. Time (18 mins)

## 2023-01-26 DIAGNOSIS — M0609 Rheumatoid arthritis without rheumatoid factor, multiple sites: Secondary | ICD-10-CM | POA: Diagnosis not present

## 2023-01-27 DIAGNOSIS — H2512 Age-related nuclear cataract, left eye: Secondary | ICD-10-CM | POA: Diagnosis not present

## 2023-01-27 DIAGNOSIS — H2513 Age-related nuclear cataract, bilateral: Secondary | ICD-10-CM | POA: Diagnosis not present

## 2023-01-28 DIAGNOSIS — H2511 Age-related nuclear cataract, right eye: Secondary | ICD-10-CM | POA: Diagnosis not present

## 2023-01-28 DIAGNOSIS — H25011 Cortical age-related cataract, right eye: Secondary | ICD-10-CM | POA: Diagnosis not present

## 2023-01-28 DIAGNOSIS — H25041 Posterior subcapsular polar age-related cataract, right eye: Secondary | ICD-10-CM | POA: Diagnosis not present

## 2023-02-17 DIAGNOSIS — H2511 Age-related nuclear cataract, right eye: Secondary | ICD-10-CM | POA: Diagnosis not present

## 2023-02-19 ENCOUNTER — Emergency Department (HOSPITAL_BASED_OUTPATIENT_CLINIC_OR_DEPARTMENT_OTHER)
Admission: EM | Admit: 2023-02-19 | Discharge: 2023-02-19 | Disposition: A | Discharge status: Home / Self Care | Payer: Medicare Other | Attending: Emergency Medicine | Admitting: Emergency Medicine

## 2023-02-19 ENCOUNTER — Emergency Department (HOSPITAL_BASED_OUTPATIENT_CLINIC_OR_DEPARTMENT_OTHER): Payer: Medicare Other

## 2023-02-19 ENCOUNTER — Encounter (HOSPITAL_BASED_OUTPATIENT_CLINIC_OR_DEPARTMENT_OTHER): Payer: Self-pay | Admitting: Emergency Medicine

## 2023-02-19 DIAGNOSIS — S93402A Sprain of unspecified ligament of left ankle, initial encounter: Secondary | ICD-10-CM | POA: Diagnosis not present

## 2023-02-19 DIAGNOSIS — S99912A Unspecified injury of left ankle, initial encounter: Secondary | ICD-10-CM | POA: Diagnosis present

## 2023-02-19 DIAGNOSIS — I10 Essential (primary) hypertension: Secondary | ICD-10-CM | POA: Diagnosis not present

## 2023-02-19 DIAGNOSIS — X501XXA Overexertion from prolonged static or awkward postures, initial encounter: Secondary | ICD-10-CM | POA: Diagnosis not present

## 2023-02-19 DIAGNOSIS — M25572 Pain in left ankle and joints of left foot: Secondary | ICD-10-CM | POA: Diagnosis not present

## 2023-02-19 DIAGNOSIS — I2510 Atherosclerotic heart disease of native coronary artery without angina pectoris: Secondary | ICD-10-CM | POA: Diagnosis not present

## 2023-02-19 DIAGNOSIS — S93492A Sprain of other ligament of left ankle, initial encounter: Secondary | ICD-10-CM | POA: Diagnosis not present

## 2023-02-19 MED ORDER — OXYCODONE-ACETAMINOPHEN 5-325 MG PO TABS
1.0000 | ORAL_TABLET | Freq: Once | ORAL | Status: AC
  Administered 2023-02-19: 1 via ORAL
  Filled 2023-02-19: qty 1

## 2023-02-19 NOTE — ED Provider Notes (Signed)
[place] EMERGENCY DEPARTMENT AT [facility] Provider Note   CSN: [id number] Arrival date & time: 02/19/23  1635     History  No chief complaint on file.   Autumn Frye is a 62 y.o. female with a history of hypertension, rheumatoid arthritis, and coronary artery disease who presents to the ED today for left ankle pain. Patient reports that she was at a concert last night and tripped and twisted her left ankle on 3 separate occasions.  She denies falling.  She reports that the pain worsens throughout the day as she walked on it. Patient woke up this morning with worse pain than yesterday and admits to difficulty with ambulation. She put a wrap on her ankle with some relief of symptoms. No other complains or concerns at this time.    Home Medications Prior to Admission medications   Medication Sig Start Date End Date Taking? Authorizing Provider  acetaminophen (TYLENOL) 500 MG tablet Take 1 tablet (500 mg total) by mouth every 6 (six) hours as needed. 06/17/17   Antony Madura, PA-C  Acetylcysteine (N-ACETYL-L-CYSTEINE PO) Take 1 capsule by mouth daily.    [provider]  albuterol (PROVENTIL) (2.5 MG/3ML) 0.083% nebulizer solution Take 3 mLs (2.5 mg total) by nebulization every 6 (six) hours as needed for wheezing or shortness of breath. 12/18/18   Judd Gaudier, NP  albuterol (VENTOLIN HFA) 108 (90 Base) MCG/ACT inhaler INHALE 2 PUFFS INTO THE LUNGS EVERY 4 HOURS 15 MINUTES APART AS NEEDED FOR ASTHMA 11/26/20   Judd Gaudier, NP  amitriptyline (ELAVIL) 25 MG tablet TAKE 1 TABLET BY MOUTH EVERY MORNING AND 3 TABLETS BY MOUTH AT BEDTIME FOR CHRONIC PAIN 10/05/22   Lucky Cowboy, MD  ASPIRIN 81 PO Take by mouth daily.    [provider]  Black Pepper-Turmeric (TURMERIC CURCUMIN) 01-999 MG CAPS Takes 1 capsule  Daily 06/08/21   Lucky Cowboy, MD  Cholecalciferol (VITAMIN D3) 5000 UNITS TABS Take 10,000 Units by mouth daily.     [provider]   CRANBERRY PO Take 1 tablet by mouth 2 (two) times daily.    [provider]  diclofenac Sodium (VOLTAREN) 1 % GEL Apply 2 g topically as needed (inflamation).    [provider]  Eyelid Cleansers (AVENOVA) 0.01 % SOLN Place 1 application into both eyes 2 (two) times daily. Apply to eyelids BID 12/18/18   Judd Gaudier, NP  fexofenadine (ALLEGRA) 180 MG tablet Take 180 mg by mouth daily as needed.    [provider]  fluticasone furoate-vilanterol (BREO ELLIPTA) 100-25 MCG/ACT AEPB INHALE 1 PUFF BY MOUTH DAILY. 08/06/22   Raynelle Dick, NP  furosemide (LASIX) 40 MG tablet Take 1 tablet (40 mg total) by mouth once a week. 02/16/22   Raynelle Dick, NP  Golimumab (SIMPONI ARIA IV) Inject 2 mg/kg into the vein every 8 (eight) weeks.    [provider]  levothyroxine (SYNTHROID) 125 MCG tablet Take  1 tablet  Daily  on an empty stomach with only water for 30 minutes & no Antacid meds, Calcium or Magnesium for 4 hours & avoid Biotin                                                                                          /  TAKE                                                       BY                                               MOUTH 12/09/22   Lucky Cowboy, MD  LOTEMAX 0.5 % GEL INSTILL 1 DROP IN BOTH EYES THREE TIMES DAILY 08/08/20   Lucky Cowboy, MD  Magnesium Oxide 500 MG TABS Take 1,000 mg by mouth daily.    [provider]  Methocarbamol 1000 MG TABS Take 1,000 mg by mouth as needed. 09/08/22   Raynelle Dick, NP  MILK THISTLE EXTRACT PO Take 2 capsules by mouth daily.    [provider]  montelukast (SINGULAIR) 10 MG tablet TAKE 1 TABLET BY MOUTH DAILY FOR ALLERGIES 08/28/22   Adela Glimpse, NP  mupirocin ointment (BACTROBAN) 2 % APPLY EXTERNALLY TO THE AFFECTED AREA THREE TIMES DAILY FOR 10 DAYS 11/20/20   Lucky Cowboy, MD  olmesartan (BENICAR) 20 MG tablet TAKE 1 TABLET BY MOUTH  DAILY FOR BLOOD PRESSURE 10/20/22   Raynelle Dick, NP  Omega-3 Fatty Acids (FISH OIL) 1200 MG CAPS Take 2 capsules by mouth daily.     [provider]  Polyethyl Glycol-Propyl Glycol 0.4-0.3 % SOLN Place 1 drop into both eyes 4 (four) times daily.     [provider]  REPATHA SURECLICK 140 MG/ML SOAJ ADMINISTER 1 ML UNDER THE SKIN EVERY 14 DAYS 12/09/22   Nahser, Deloris Ping, MD  RESTASIS 0.05 % ophthalmic emulsion Place 1 drop into both eyes daily.  08/07/14   [provider]  rosuvastatin (CRESTOR) 40 MG tablet TAKE 1 TABLET BY MOUTH DAILY FOR CHOLESTEROL 10/20/22   Raynelle Dick, NP  triamcinolone (NASACORT) 55 MCG/ACT AERO nasal inhaler Place 2 sprays into the nose 2 (two) times daily. 09/08/22 09/08/23  Raynelle Dick, NP  vitamin B-12 (CYANOCOBALAMIN) 1000 MCG tablet Take 1,000 mcg by mouth daily.    [provider]      Allergies    Ciprofloxacin, Levofloxacin, Lyrica [pregabalin], Compazine [prochlorperazine edisylate], Gabapentin, Sulfamethoxazole-trimethoprim, Xeljanz [tofacitinib], Zetia [ezetimibe], Hydrocodone, Morphine and codeine, Penicillins, and Sulfa antibiotics    Review of Systems   Review of Systems  Musculoskeletal:        Left ankle pain  All other systems reviewed and are negative.   Physical Exam Updated Vital Signs BP (!) 133/118   Pulse 100   Temp 98.5 F (36.9 C) (Oral)   Resp 20   LMP  (LMP Unknown)   SpO2 96%  Physical Exam Vitals and nursing note reviewed.  Constitutional:      Appearance: Normal appearance.  HENT:     Head: Normocephalic and atraumatic.     Mouth/Throat:     Mouth: Mucous membranes are moist.  Eyes:     Conjunctiva/sclera: Conjunctivae normal.     Pupils: Pupils are equal, round, and reactive to light.  Cardiovascular:     Rate and Rhythm: Normal rate and regular rhythm.     Pulses: Normal pulses.  Pulmonary:     Effort: Pulmonary effort is normal.  Breath sounds: Normal breath sounds.   Abdominal:     Palpations: Abdomen is soft.     Tenderness: There is no abdominal tenderness.  Musculoskeletal:        General: Swelling and tenderness present. Normal range of motion.     Comments: Tenderness and edema to the lateral aspect of the left ankle behind her lateral malleolus as well as in front of it. Distal tibalis pulse intact. No pain to palpation of midfoot. Right ankle was unremarkable.  Skin:    General: Skin is warm and dry.     Findings: No rash.  Neurological:     General: No focal deficit present.     Mental Status: She is alert.  Psychiatric:        Mood and Affect: Mood normal.        Behavior: Behavior normal.     ED Results / Procedures / Treatments   Labs (all labs ordered are listed, but only abnormal results are displayed) Labs Reviewed - No data to display  EKG None  Radiology DG Ankle Complete Left  Result Date: 02/19/2023 CLINICAL DATA:  Left ankle pain. EXAM: LEFT ANKLE COMPLETE - 3+ VIEW COMPARISON:  Left foot radiographs dated 12/27/2018. FINDINGS: There is no evidence of fracture, dislocation, or joint effusion. A plantar calcaneal enthesophyte is noted. Soft tissues are unremarkable. IMPRESSION: No acute osseous injury. Electronically Signed   By: Romona Curls M.D.   On: 02/19/2023 17:47    Procedures Procedures: not indicated.   Medications Ordered in ED Medications  oxyCODONE-acetaminophen (PERCOCET/ROXICET) 5-325 MG per tablet 1 tablet (1 tablet Oral Given 02/19/23 1802)    ED Course/ Medical Decision Making/ A&P                             Medical Decision Making Amount and/or Complexity of Data Reviewed Radiology: ordered.  Risk Prescription drug management.   Patient presents to the ED for left ankle pain x 2 days.  She was at a concert yesterday and reports she twisted her left ankle 3 different times and developed pain throughout the day and difficulty ambulating on it.  This morning she had worsening pain and put an  ankle brace on which helps some. My differentials include: ankle fracture vs sprain vs contusion  Patient was given Percocet for pain with relief of symptoms on reevaluation. I ordered an left ankle x-ray which showed no fracture, dislocation, or joint effusion.   Patient was given an ACE wrap and crutches. Referral for orthopedics given to follow up with for further evaluation. RICE therapy advised. Strict return precautions provided.        Final Clinical Impression(s) / ED Diagnoses Final diagnoses:  Sprain of left ankle, unspecified ligament, initial encounter    Rx / DC Orders ED Discharge Orders     None         Maxwell Marion, PA-C 02/19/23 1837    Tegeler, Canary Brim, MD 02/20/23 0000

## 2023-02-19 NOTE — Discharge Instructions (Addendum)
You have a left ankle sprain. Discussed importance of RICE therapy.  You can alternate between Ibuprofen and Tylenol every 4 hours for pain.  Bear weight as tolerated.  Information for Dr. Susa Simmonds, ortho, given for further evaluation in 48 hours.  Return to ED if: your foot or toes are numb or blue, you have very bad pain that gets worse

## 2023-02-19 NOTE — ED Triage Notes (Signed)
Twisted left ankle 3 times last night.

## 2023-02-24 ENCOUNTER — Telehealth: Payer: Self-pay

## 2023-02-24 NOTE — Telephone Encounter (Signed)
Transition Care Management Unsuccessful Follow-up Telephone Call  Date of discharge and from where:  02/19/2023 [facility]  Attempts:  1st Attempt  Reason for unsuccessful TCM follow-up call:  Left voice message  Tashea Othman [facility]  [facility] Population Health Community Resource Care Guide   [email or web address]  ?? [phone]   Website: [email or web address]  [email or web address]

## 2023-02-28 ENCOUNTER — Telehealth: Payer: Self-pay

## 2023-02-28 NOTE — Telephone Encounter (Signed)
Transition Care Management Unsuccessful Follow-up Telephone Call  Date of discharge and from where:  02/19/2023 [facility]  Attempts:  2nd Attempt  Reason for unsuccessful TCM follow-up call:  Left voice message  Georgie Haque [facility]  [facility] Population Health Community Resource Care Guide   [email or web address]  ?? [phone]   Website: [email or web address]  [email or web address]

## 2023-03-07 NOTE — Progress Notes (Deleted)
MEDICARE ANNUAL WELLNESS VISIT AND FOLLOW UP  Assessment:    Diagnoses and all orders for this visit:  Annual Medicare Wellness Visit Due annually  Health maintenance reviewed  Atherosclerosis of aorta (HCC) - CT 06/2018 Control blood pressure, cholesterol, glucose, increase exercise.   CAD Low risk myoview 01/2021 Dr. Elease Hashimoto following LDL goal <70 - lipid clinic  Control blood pressure, cholesterol, glucose, increase exercise. Reminder to go to the ER if any CP, SOB, nausea, dizziness, severe fatigue   Essential hypertension Continue medication Monitor blood pressure at home; call if consistently over 130/80 Continue DASH diet.   Reminder to go to the ER if any CP, SOB, nausea, dizziness, severe HA, changes vision/speech, left arm numbness and tingling and jaw pain.  Intrinsic asthma Stable on inhalers/meds  Sjogren's syndrome, with unspecified organ involvement (HCC) On Symponi, followed by rheumatology  Rheumatoid arthritis involving multiple sites, unspecified rheumatoid factor presence (HCC) Managed by Dr. Kathi Ludwig, on [facility], has back stimulator Will try Methocarbomol for muscle relaxant  Cervical spondylosis with radiculopathy Currently fairly managed; followed by Dr. Kathi Ludwig and Dr. Bevely Palmer  Degeneration of intervertebral disc of lumbosacral region Continue NSAID PRN, follows rheum  Postoperative hypothyroidism Dr. Talmage Nap continue medications the same pending lab results reminded to take on an empty stomach 30-40mins before food.  check TSH level  Vitamin D deficiency At goal at recent check; continue to recommend supplementation for goal of 70-100 Defer vitamin D level to CPE next OV  Abnormal glucose Off of metformin Discussed disease and risks Discussed diet/exercise, weight management  Check CMP, A1C q80m;   Morbid obesity (HCC) - BMI 35+ with htn, hyperlipidemia  Long discussion about weight loss, diet, and exercise Recommended diet heavy in fruits  and veggies and low in animal meats, cheeses, and dairy products, appropriate calorie intake Discussed appropriate weight for height  Follow up at next visit  Mixed hyperlipidemia Continue medications; rosuvastatin 40 mg daily, repatha q2w Discussed LDL goal <70 Continue low cholesterol diet and exercise.  Defer lipid panel to lipid clinic  Medication management CBC, CMP/GFR   Former smoker/Pulmonary emphysema on CT scan(HCC) 30 pack year, quit 2015, last CT screening 10/15/22 benign continue yearly Uses Albuterol as needed and Breo daily     Over 40 minutes of exam, counseling, chart review and critical decision making was performed Future Appointments  Date Time Provider Department Center  03/14/2023  2:30 PM Raynelle Dick, NP [facility] None  07/06/2023 10:00 AM Lucky Cowboy, MD [facility] None     Plan:   During the course of the visit the patient was educated and counseled about appropriate screening and preventive services including:   Pneumococcal vaccine  Prevnar 13 Influenza vaccine Td vaccine Screening electrocardiogram Bone densitometry screening Colorectal cancer screening Diabetes screening Glaucoma screening Nutrition counseling  Advanced directives: requested   Subjective:  Autumn Frye is a 62 y.o. female who presents for Medicare Annual Wellness Visit and 3 month follow up. She has Intrinsic asthma; Essential hypertension; Hypothyroidism; Hyperlipidemia, mixed; Vitamin D deficiency; Medication management; Sjogren's disease (HCC); Rheumatoid arthritis involving multiple sites with positive rheumatoid factor (HCC); Degeneration of intervertebral disc of lumbosacral region; Cervical spondylosis with radiculopathy; Abnormal glucose; Morbid obesity (HCC)- BMI 35+ with htn, hld, CVD; Hidradenitis suppurativa; Aortic atherosclerosis (HCC) by Chest CT scan  May 2021; Former smoker, 30 pack year, quit 2015; Insulin resistance; Carotid stenosis; and CAD in  native artery on their problem list.   Patient is followed by Dr Kathi Ludwig  for  Rheumatoid Arthritis and Sjogren's since 2009. Currently on symponi per patient.  Chronic neck/lumbar pain, has spinal stimulator, no longer needing buprenorphine patches, follow up Dr. Gala Romney only as needed.   She has had good results with Methocarbomol. Spinal stimulator is helping.  Off all pain medications.    Dr. Talmage Nap with endocrinology following for hypothyroid and hidradenitis suppurativa. She has been on doxycycline 50 mg daily for many years as prophylaxis for this issue.Currently on Levothyroxine 125 mcg every day.   Asthma is controlled by singulaire daily, BREO PRN if has a cold.  No recent flares.   BMI is There is no height or weight on file to calculate BMI., she has been working on diet and exercise, Has not been taking as much exercise- 2 granddaughter live with them 4 days a week. Does plan to restart exercise Wt Readings from Last 3 Encounters:  12/08/22 208 lb 12.8 oz (94.7 kg)  09/08/22 206 lb 3.2 oz (93.5 kg)  08/17/22 205 lb (93 kg)   Former smoker, 30 pack year history, quit in 2015, had benign lungs on low dose CT screening 07/15/2021  CT did show aortic atherosclerosis and coronary artery calcifications.  CT coronary calcium 11/2020 showed total calcium score 139, 90-100% for age/gender, Dr. Elease Hashimoto is following.  She did have low risk myoview on 01/20/2021.   Her blood pressure has been controlled at home with Olmesartan 20 mg every day  Today their BP is    BP Readings from Last 3 Encounters:  02/19/23 (!) 133/118  12/08/22 120/70  09/08/22 100/62  She does workout. She denies chest pain, shortness of breath, dizziness.   She is on cholesterol medication (rosuvastatin 40 mg daily, repatha q2w and tolerating well, follows with lipid clinic, labile LDLs, reports fasting today except coffee with cream this AM) and denies myalgias. Her LDL cholesterol was at goal last check, has been at goal  previously. The cholesterol last visit was:   Lab Results  Component Value Date   CHOL 203 (H) 12/08/2022   HDL 124 12/08/2022   LDLCALC 66 12/08/2022   TRIG 54 12/08/2022   CHOLHDL 1.6 12/08/2022    She has been working on diet and exercise for hx of prediabetes formerly on metformin but has been able to taper off, and denies foot ulcerations, increased appetite, nausea, paresthesia of the feet, polydipsia, polyuria and visual disturbances. Last A1C in the office was:  Lab Results  Component Value Date   HGBA1C 5.7 (H) 12/08/2022   She is on thyroid medication. She is currently on Levothyroxine 125 mcg daily and followed by Dr. Talmage Nap Lab Results  Component Value Date   TSH 9.62 (H) 12/08/2022   She is trying to drink lots of water. Last GFR: Lab Results  Component Value Date   EGFR 91 12/08/2022    Patient is on Vitamin D supplement.   Lab Results  Component Value Date   VD25OH 65 12/08/2022      Medication Review: Current Outpatient Medications on File Prior to Visit  Medication Sig Dispense Refill   acetaminophen (TYLENOL) 500 MG tablet Take 1 tablet (500 mg total) by mouth every 6 (six) hours as needed. 30 tablet 0   Acetylcysteine (N-ACETYL-L-CYSTEINE PO) Take 1 capsule by mouth daily.     albuterol (PROVENTIL) (2.5 MG/3ML) 0.083% nebulizer solution Take 3 mLs (2.5 mg total) by nebulization every 6 (six) hours as needed for wheezing or shortness of breath. 60 vial 1   albuterol (VENTOLIN HFA) 108 (  90 Base) MCG/ACT inhaler INHALE 2 PUFFS INTO THE LUNGS EVERY 4 HOURS 15 MINUTES APART AS NEEDED FOR ASTHMA 40.2 g 1   amitriptyline (ELAVIL) 25 MG tablet TAKE 1 TABLET BY MOUTH EVERY MORNING AND 3 TABLETS BY MOUTH AT BEDTIME FOR CHRONIC PAIN 360 tablet 3   ASPIRIN 81 PO Take by mouth daily.     Black Pepper-Turmeric (TURMERIC CURCUMIN) 01-999 MG CAPS Takes 1 capsule  Daily     Cholecalciferol (VITAMIN D3) 5000 UNITS TABS Take 10,000 Units by mouth daily.      CRANBERRY PO Take  1 tablet by mouth 2 (two) times daily.     diclofenac Sodium (VOLTAREN) 1 % GEL Apply 2 g topically as needed (inflamation).     Eyelid Cleansers (AVENOVA) 0.01 % SOLN Place 1 application into both eyes 2 (two) times daily. Apply to eyelids BID 1 Bottle 1   fexofenadine (ALLEGRA) 180 MG tablet Take 180 mg by mouth daily as needed.     fluticasone furoate-vilanterol (BREO ELLIPTA) 100-25 MCG/ACT AEPB INHALE 1 PUFF BY MOUTH DAILY. 60 each 3   furosemide (LASIX) 40 MG tablet Take 1 tablet (40 mg total) by mouth once a week. 30 tablet 2   Golimumab (SIMPONI ARIA IV) Inject 2 mg/kg into the vein every 8 (eight) weeks.     levothyroxine (SYNTHROID) 125 MCG tablet Take  1 tablet  Daily  on an empty stomach with only water for 30 minutes & no Antacid meds, Calcium or Magnesium for 4 hours & avoid Biotin                                                                                          /                                                   TAKE                                                       BY                                               MOUTH 90 tablet 1   LOTEMAX 0.5 % GEL INSTILL 1 DROP IN BOTH EYES THREE TIMES DAILY 5 g 0   Magnesium Oxide 500 MG TABS Take 1,000 mg by mouth daily.     Methocarbamol 1000 MG TABS Take 1,000 mg by mouth as needed. 30 tablet 1   MILK THISTLE EXTRACT PO Take 2 capsules by mouth daily.     montelukast (SINGULAIR) 10 MG tablet TAKE 1 TABLET BY MOUTH DAILY FOR ALLERGIES 90 tablet 3   mupirocin ointment (BACTROBAN) 2 % APPLY EXTERNALLY TO  THE AFFECTED AREA THREE TIMES DAILY FOR 10 DAYS 22 g 3   olmesartan (BENICAR) 20 MG tablet TAKE 1 TABLET BY MOUTH DAILY FOR BLOOD PRESSURE 90 tablet 1   Omega-3 Fatty Acids (FISH OIL) 1200 MG CAPS Take 2 capsules by mouth daily.      Polyethyl Glycol-Propyl Glycol 0.4-0.3 % SOLN Place 1 drop into both eyes 4 (four) times daily.      REPATHA SURECLICK 140 MG/ML SOAJ ADMINISTER 1 ML UNDER THE SKIN EVERY 14 DAYS 6 mL 3   RESTASIS 0.05 %  ophthalmic emulsion Place 1 drop into both eyes daily.   3   rosuvastatin (CRESTOR) 40 MG tablet TAKE 1 TABLET BY MOUTH DAILY FOR CHOLESTEROL 90 tablet 1   triamcinolone (NASACORT) 55 MCG/ACT AERO nasal inhaler Place 2 sprays into the nose 2 (two) times daily. 1 each 2   vitamin B-12 (CYANOCOBALAMIN) 1000 MCG tablet Take 1,000 mcg by mouth daily.     No current facility-administered medications on file prior to visit.    Allergies  Allergen Reactions   Ciprofloxacin Hives and Swelling    SWELLING REACTION UNSPECIFIED    Levofloxacin Hives and Other (See Comments)   Lyrica [Pregabalin] Other (See Comments)    Altered mental status ALTERED MENTAL STATUS HALLUCINATIONS   Compazine [Prochlorperazine Edisylate]     UNSPECIFIED REACTION    Gabapentin Other (See Comments)    Diarrhea and nausea   Sulfamethoxazole-Trimethoprim Other (See Comments)    itch   Xeljanz [Tofacitinib] Hives   Zetia [Ezetimibe]     Stomach cramping   Hydrocodone Itching   Morphine And Codeine Itching   Penicillins Itching   Sulfa Antibiotics Itching    Current Problems (verified) Patient Active Problem List   Diagnosis Date Noted   CAD in native artery 12/03/2020   Carotid stenosis 10/25/2020   Insulin resistance 05/12/2020   Former smoker, 30 pack year, quit 2015 01/09/2020   Aortic atherosclerosis (HCC) by Chest CT scan  May 2021 01/08/2020   Hidradenitis suppurativa 05/16/2019   Morbid obesity (HCC)- BMI 35+ with htn, hld, CVD 11/08/2017   Abnormal glucose 08/02/2017   Cervical spondylosis with radiculopathy 05/30/2017   Rheumatoid arthritis involving multiple sites with positive rheumatoid factor (HCC) 11/14/2015   Sjogren's disease (HCC) 07/05/2014   Medication management 03/26/2014   Hyperlipidemia, mixed 09/24/2013   Vitamin D deficiency 09/24/2013   Intrinsic asthma    Essential hypertension    Hypothyroidism    Degeneration of intervertebral disc of lumbosacral region 05/23/2008     Screening Tests Immunization History  Administered Date(s) Administered   DTaP 09/15/2012, 09/15/2012   Influenza Inj Mdck Quad With Preservative 08/02/2017   Influenza Whole 09/06/2012   Influenza-Unspecified 05/07/2014, 05/20/2016, 08/02/2017   Pneumococcal Conjugate-13 07/05/2014   Pneumococcal Polysaccharide-23 03/02/2021   Zoster, Live 09/03/2013   Health Maintenance  Topic Date Due   COVID-19 Vaccine (1) Never done   DTaP/Tdap/Td (3 - Tdap) 09/15/2022   INFLUENZA VACCINE  04/07/2023   Lung Cancer Screening  10/16/2023   MAMMOGRAM  10/16/2023   Medicare Annual Wellness (AWV)  03/13/2024   Colonoscopy  12/03/2031   Hepatitis C Screening  Completed   HIV Screening  Completed   HPV VACCINES  Aged Out   PAP SMEAR-Modifier  Discontinued   Zoster Vaccines- Shingrix  Discontinued     Names of Other Physician/Practitioners you currently use: 1. [facility] here for primary care 2. [facility], eye doctor, last visit 2023 -  mild cataracts 3. [facility], dentist, last visit 2022, goes q43m   Patient Care Team: Lucky Cowboy, MD as PCP - General (Internal Medicine) Nahser, Deloris Ping, MD as PCP - Cardiology (Cardiology) Meryl Dare, MD as Consulting Physician (Gastroenterology) Nadara Mustard, MD as Consulting Physician (Orthopedic Surgery) Lurena Nida, MD as Consulting Physician (Rheumatology) Aliene Beams, MD as Consulting Physician (Neurosurgery) Cherlyn Roberts, MD as Consulting Physician (Dermatology) Cherlyn Roberts, MD as Consulting Physician (Dermatology) Charlett Nose, [facility] (Inactive) as Pharmacist (Pharmacist)  SURGICAL HISTORY She  has a past surgical history that includes Thyroidectomy; Upper gastrointestinal endoscopy; Total knee arthroplasty; Vaginal hysterectomy; Knee surgery; Pilonidal cyst excision; Tonsillectomy; Anterior lat lumbar fusion (Left, 01/26/2013); Lumbar percutaneous pedicle screw  1 level (Left, 01/26/2013); Knee Arthroplasty (Left, 02/16/2016); and Anterior cervical decomp/discectomy fusion (N/A, 05/30/2017). FAMILY HISTORY Her family history includes Alzheimer's disease in her paternal grandfather; Asthma in her brother; Breast cancer (age of onset: 70) in her maternal grandmother; Diabetes in her brother; Heart attack in her paternal grandfather; Heart attack (age of onset: 10) in her father; Hypertension in her father and maternal grandmother; Lung cancer (age of onset: 53) in her father; Other in her daughter; Stroke in her paternal grandmother; Stroke (age of onset: 42) in her father. SOCIAL HISTORY She  reports that she quit smoking about 7 years ago. Her smoking use included cigarettes. She has a 30.00 pack-year smoking history. She has never used smokeless tobacco. She reports current alcohol use. She reports that she does not use drugs.   MEDICARE WELLNESS OBJECTIVES: Physical activity:   Cardiac risk factors:   Depression/mood screen:      12/08/2022    9:48 PM  Depression screen PHQ 2/9  Decreased Interest 0  Down, Depressed, Hopeless 0  PHQ - 2 Score 0    ADLs:     12/08/2022    9:48 PM 09/08/2022   11:01 AM  In your present state of health, do you have any difficulty performing the following activities:  Hearing? 0 0  Vision? 0   Difficulty concentrating or making decisions? 0 0  Walking or climbing stairs? 0 0  Dressing or bathing? 0 0  Doing errands, shopping? 0 0     Cognitive Testing  Alert? Yes  Normal Appearance?Yes  Oriented to person? Yes  Place? Yes   Time? Yes  Recall of three objects?  Yes  Can perform simple calculations? Yes  Displays appropriate judgment?Yes  Can read the correct time from a watch face?Yes  EOL planning:    Review of Systems  Constitutional:  Negative for malaise/fatigue and weight loss.  HENT:  Negative for hearing loss and tinnitus.   Eyes:  Negative for blurred vision and double vision.  Respiratory:   Negative for cough, sputum production, shortness of breath and wheezing.   Cardiovascular:  Negative for chest pain, palpitations, orthopnea, claudication, leg swelling and PND.  Gastrointestinal:  Negative for abdominal pain, blood in stool, constipation, diarrhea, heartburn, melena, nausea and vomiting.  Genitourinary: Negative.   Musculoskeletal:  Positive for joint pain (wrists, follows rheumatology). Negative for falls and myalgias.  Skin:  Negative for rash.  Neurological:  Negative for dizziness, tingling, sensory change, weakness and headaches.  Endo/Heme/Allergies:  Positive for environmental allergies (mild). Negative for polydipsia.  Psychiatric/Behavioral: Negative.  Negative for depression, memory loss, substance abuse and suicidal ideas. The patient is not nervous/anxious and does not have insomnia.   All other systems reviewed and are negative.    Objective:  There were no vitals filed for this visit.  There is no height or weight on file to calculate BMI.  General Appearance: Well nourished, in no apparent distress. Eyes: PERRLA, EOMs, conjunctiva no swelling or erythema Sinuses: No Frontal/maxillary tenderness ENT/Mouth: Ext aud canals clear, TMs without erythema, bulging. No erythema, swelling, or exudate on post pharynx.  Tonsils not swollen or erythematous. Hearing normal.  Neck: Supple, thyroid normal.  Respiratory: Respiratory effort normal, BS equal bilaterally without rales, rhonchi, wheezing or stridor.  Cardio: RRR with no MRGs. Brisk peripheral pulses without edema.  Abdomen: Soft, + BS.  Non tender, no guarding, rebound, hernias, masses. Lymphatics: Non tender without lymphadenopathy.  Musculoskeletal: Full ROM, 5/5 strength, Mildly antalgic gait. Skin: Warm, dry; intact without rashes, lesions, ecchynosis Neuro: Cranial nerves intact. No cerebellar symptoms.  Psych: Awake and oriented X 3, normal affect, Insight and Judgment appropriate.    Medicare  Attestation I have personally reviewed: The patient's medical and social history Their use of alcohol, tobacco or illicit drugs Their current medications and supplements The patient's functional ability including ADLs,fall risks, home safety risks, cognitive, and hearing and visual impairment Diet and physical activities Evidence for depression or mood disorders  The patient's weight, height, BMI, and visual acuity have been recorded in the chart.  I have made referrals, counseling, and provided education to the patient based on review of the above and I have provided the patient with a written personalized care plan for preventive services.     Raynelle Dick, NP   03/07/2023

## 2023-03-14 ENCOUNTER — Ambulatory Visit: Payer: Medicare Other | Admitting: Nurse Practitioner

## 2023-03-14 DIAGNOSIS — M5137 Other intervertebral disc degeneration, lumbosacral region: Secondary | ICD-10-CM

## 2023-03-14 DIAGNOSIS — M0579 Rheumatoid arthritis with rheumatoid factor of multiple sites without organ or systems involvement: Secondary | ICD-10-CM

## 2023-03-14 DIAGNOSIS — R7309 Other abnormal glucose: Secondary | ICD-10-CM

## 2023-03-14 DIAGNOSIS — J45909 Unspecified asthma, uncomplicated: Secondary | ICD-10-CM

## 2023-03-14 DIAGNOSIS — E782 Mixed hyperlipidemia: Secondary | ICD-10-CM

## 2023-03-14 DIAGNOSIS — I10 Essential (primary) hypertension: Secondary | ICD-10-CM

## 2023-03-14 DIAGNOSIS — I2510 Atherosclerotic heart disease of native coronary artery without angina pectoris: Secondary | ICD-10-CM

## 2023-03-14 DIAGNOSIS — Z0000 Encounter for general adult medical examination without abnormal findings: Secondary | ICD-10-CM

## 2023-03-14 DIAGNOSIS — I700 Atherosclerosis of aorta: Secondary | ICD-10-CM

## 2023-03-14 DIAGNOSIS — M4722 Other spondylosis with radiculopathy, cervical region: Secondary | ICD-10-CM

## 2023-03-14 DIAGNOSIS — E559 Vitamin D deficiency, unspecified: Secondary | ICD-10-CM

## 2023-03-14 DIAGNOSIS — Z79899 Other long term (current) drug therapy: Secondary | ICD-10-CM

## 2023-03-14 DIAGNOSIS — E039 Hypothyroidism, unspecified: Secondary | ICD-10-CM

## 2023-03-14 DIAGNOSIS — M3500 Sicca syndrome, unspecified: Secondary | ICD-10-CM

## 2023-03-22 ENCOUNTER — Other Ambulatory Visit: Payer: Self-pay | Admitting: Nurse Practitioner

## 2023-03-23 DIAGNOSIS — M0609 Rheumatoid arthritis without rheumatoid factor, multiple sites: Secondary | ICD-10-CM | POA: Diagnosis not present

## 2023-03-29 NOTE — Progress Notes (Deleted)
3 MONTH FOLLOW UP  Assessment:    Diagnoses and all orders for this visit  Atherosclerosis of aorta (HCC) - CT 06/2018 Control blood pressure, cholesterol, glucose, increase exercise.   CAD Low risk myoview 01/2021 Dr. Elease Hashimoto following LDL goal <70 - lipid clinic  Control blood pressure, cholesterol, glucose, increase exercise. Reminder to go to the ER if any CP, SOB, nausea, dizziness, severe fatigue   Essential hypertension Continue medication Monitor blood pressure at home; call if consistently over 130/80 Continue DASH diet.   Reminder to go to the ER if any CP, SOB, nausea, dizziness, severe HA, changes vision/speech, left arm numbness and tingling and jaw pain.  Intrinsic asthma Stable on inhalers/meds  Sjogren's syndrome, with unspecified organ involvement (HCC) On Symponi, followed by rheumatology  Rheumatoid arthritis involving multiple sites, unspecified rheumatoid factor presence (HCC) Managed by Dr. Kathi Ludwig, on [facility], has back stimulator Will try Methocarbomol for muscle relaxant  Cervical spondylosis with radiculopathy Currently fairly managed; followed by Dr. Kathi Ludwig and Dr. Bevely Palmer  Degeneration of intervertebral disc of lumbosacral region Continue NSAID PRN, follows rheum  Postoperative hypothyroidism Dr. Talmage Nap continue medications the same pending lab results reminded to take on an empty stomach 30-39mins before food.  check TSH level  Vitamin D deficiency At goal at recent check; continue to recommend supplementation for goal of 70-100 Defer vitamin D level to CPE next OV  Abnormal glucose Off of metformin Discussed disease and risks Discussed diet/exercise, weight management  A1C q61m;   Morbid obesity (HCC) - BMI 35+ with htn, hyperlipidemia  Long discussion about weight loss, diet, and exercise Recommended diet heavy in fruits and veggies and low in animal meats, cheeses, and dairy products, appropriate calorie intake Discussed appropriate  weight for height  Follow up at next visit  Mixed hyperlipidemia Continue medications; rosuvastatin 40 mg daily, repatha q2w Discussed LDL goal <70 Continue low cholesterol diet and exercise.  Defer lipid panel to lipid clinic  Medication management CBC, CMP/GFR, magnesium at routine visits        Over 40 minutes of exam, counseling, chart review and critical decision making was performed Future Appointments  Date Time Provider Department Center  03/30/2023  2:00 PM Raynelle Dick, NP [facility] None  07/06/2023 10:00 AM Lucky Cowboy, MD [facility] None  04/02/2024  2:30 PM Raynelle Dick, NP [facility] None     Plan:   During the course of the visit the patient was educated and counseled about appropriate screening and preventive services including:   Pneumococcal vaccine  Prevnar 13 Influenza vaccine Td vaccine Screening electrocardiogram Bone densitometry screening Colorectal cancer screening Diabetes screening Glaucoma screening Nutrition counseling  Advanced directives: requested   Subjective:  Autumn Frye is a 62 y.o. female who presents for Medicare Annual Wellness Visit and 3 month follow up. She has Intrinsic asthma; Essential hypertension; Hypothyroidism; Hyperlipidemia, mixed; Vitamin D deficiency; Medication management; Sjogren's disease (HCC); Rheumatoid arthritis involving multiple sites with positive rheumatoid factor (HCC); Degeneration of intervertebral disc of lumbosacral region; Cervical spondylosis with radiculopathy; Abnormal glucose; Morbid obesity (HCC)- BMI 35+ with htn, hld, CVD; Hidradenitis suppurativa; Aortic atherosclerosis (HCC) by Chest CT scan  May 2021; Former smoker, 30 pack year, quit 2015; Insulin resistance; Carotid stenosis; and CAD in native artery on their problem list.   Patient is followed by Dr Kathi Ludwig  for Rheumatoid Arthritis and Sjogren's since 2009. Currently on symponi per patient.  Chronic neck/lumbar pain, has  spinal stimulator, no longer needing buprenorphine patches, follow up  Dr. Gala Romney only as needed. She is having a lot of stiffness, goes 09/25/22 for next infusion- this is 2nd infusion.  She has had good results with Methocarbomol. Spinal stimulator is helping.  Off all pain medications.   She is having feeling of being hot throughout the day, no night sweats.   Not like when she was in menopause, questioning Tumeric/curcumin.   Dr. Talmage Nap with endocrinology following for hypothyroid and hidradenitis suppurativa. She has been on doxycycline 50 mg daily for many years as prophylaxis for this issue.  Asthma is controlled by singulaire daily, BREO PRN if has a cold.  No recent flares.   BMI is There is no height or weight on file to calculate BMI., she has been working on diet and exercise, Has not been taking as much exercise- 2 granddaughter live with them 4 days a week. Does plan to restart exercise Wt Readings from Last 3 Encounters:  12/08/22 208 lb 12.8 oz (94.7 kg)  09/08/22 206 lb 3.2 oz (93.5 kg)  08/17/22 205 lb (93 kg)   Former smoker, 30 pack year history, quit in 2015, had benign lungs on low dose CT screening 07/15/2021  CT did show aortic atherosclerosis and coronary artery calcifications.  CT coronary calcium 11/2020 showed total calcium score 139, 90-100% for age/gender, Dr. Elease Hashimoto is following.  She did have low risk myoview on 01/20/2021.   Her blood pressure has been controlled at home, today their BP is    BP Readings from Last 3 Encounters:  02/19/23 (!) 133/118  12/08/22 120/70  09/08/22 100/62  She does workout. She denies chest pain, shortness of breath, dizziness.   She is on cholesterol medication (rosuvastatin 40 mg daily, repatha q2w and tolerating well, follows with lipid clinic, labile LDLs, reports fasting today except coffee with cream this AM) and denies myalgias. Her LDL cholesterol is not at goal last check, has been at goal previously. The cholesterol last  visit was:   Lab Results  Component Value Date   CHOL 203 (H) 12/08/2022   HDL 124 12/08/2022   LDLCALC 66 12/08/2022   TRIG 54 12/08/2022   CHOLHDL 1.6 12/08/2022    She has been working on diet and exercise for hx of prediabetes formerly on metformin but has been able to taper off, and denies foot ulcerations, increased appetite, nausea, paresthesia of the feet, polydipsia, polyuria and visual disturbances. Last A1C in the office was:  Lab Results  Component Value Date   HGBA1C 5.7 (H) 12/08/2022   She is on thyroid medication. She is currently on Levothyroxine 100 mcg daily Lab Results  Component Value Date   TSH 9.62 (H) 12/08/2022   She is trying to drink lots of water. Last GFR: Lab Results  Component Value Date   EGFR 91 12/08/2022    Patient is on Vitamin D supplement.   Lab Results  Component Value Date   VD25OH 65 12/08/2022      Medication Review: Current Outpatient Medications on File Prior to Visit  Medication Sig Dispense Refill   acetaminophen (TYLENOL) 500 MG tablet Take 1 tablet (500 mg total) by mouth every 6 (six) hours as needed. 30 tablet 0   Acetylcysteine (N-ACETYL-L-CYSTEINE PO) Take 1 capsule by mouth daily.     albuterol (PROVENTIL) (2.5 MG/3ML) 0.083% nebulizer solution Take 3 mLs (2.5 mg total) by nebulization every 6 (six) hours as needed for wheezing or shortness of breath. 60 vial 1   albuterol (VENTOLIN HFA) 108 (90 Base) MCG/ACT  inhaler INHALE 2 PUFFS INTO THE LUNGS EVERY 4 HOURS 15 MINUTES APART AS NEEDED FOR ASTHMA 40.2 g 1   amitriptyline (ELAVIL) 25 MG tablet TAKE 1 TABLET BY MOUTH EVERY MORNING AND 3 TABLETS BY MOUTH AT BEDTIME FOR CHRONIC PAIN 360 tablet 3   ASPIRIN 81 PO Take by mouth daily.     Black Pepper-Turmeric (TURMERIC CURCUMIN) 01-999 MG CAPS Takes 1 capsule  Daily     Cholecalciferol (VITAMIN D3) 5000 UNITS TABS Take 10,000 Units by mouth daily.      CRANBERRY PO Take 1 tablet by mouth 2 (two) times daily.     diclofenac  Sodium (VOLTAREN) 1 % GEL Apply 2 g topically as needed (inflamation).     Eyelid Cleansers (AVENOVA) 0.01 % SOLN Place 1 application into both eyes 2 (two) times daily. Apply to eyelids BID 1 Bottle 1   fexofenadine (ALLEGRA) 180 MG tablet Take 180 mg by mouth daily as needed.     fluticasone furoate-vilanterol (BREO ELLIPTA) 100-25 MCG/ACT AEPB INHALE 1 PUFF BY MOUTH DAILY. 60 each 3   furosemide (LASIX) 40 MG tablet TAKE 1 TABLET BY MOUTH ONCE A WEEK 30 tablet 2   Golimumab (SIMPONI ARIA IV) Inject 2 mg/kg into the vein every 8 (eight) weeks.     levothyroxine (SYNTHROID) 125 MCG tablet Take  1 tablet  Daily  on an empty stomach with only water for 30 minutes & no Antacid meds, Calcium or Magnesium for 4 hours & avoid Biotin                                                                                          /                                                   TAKE                                                       BY                                               MOUTH 90 tablet 1   LOTEMAX 0.5 % GEL INSTILL 1 DROP IN BOTH EYES THREE TIMES DAILY 5 g 0   Magnesium Oxide 500 MG TABS Take 1,000 mg by mouth daily.     Methocarbamol 1000 MG TABS Take 1,000 mg by mouth as needed. 30 tablet 1   MILK THISTLE EXTRACT PO Take 2 capsules by mouth daily.     montelukast (SINGULAIR) 10 MG tablet TAKE 1 TABLET BY MOUTH DAILY FOR ALLERGIES 90 tablet 3   mupirocin ointment (BACTROBAN) 2 % APPLY EXTERNALLY TO THE AFFECTED AREA THREE TIMES DAILY  FOR 10 DAYS 22 g 3   olmesartan (BENICAR) 20 MG tablet TAKE 1 TABLET BY MOUTH DAILY FOR BLOOD PRESSURE 90 tablet 1   Omega-3 Fatty Acids (FISH OIL) 1200 MG CAPS Take 2 capsules by mouth daily.      Polyethyl Glycol-Propyl Glycol 0.4-0.3 % SOLN Place 1 drop into both eyes 4 (four) times daily.      REPATHA SURECLICK 140 MG/ML SOAJ ADMINISTER 1 ML UNDER THE SKIN EVERY 14 DAYS 6 mL 3   RESTASIS 0.05 % ophthalmic emulsion Place 1 drop into both eyes daily.   3    rosuvastatin (CRESTOR) 40 MG tablet TAKE 1 TABLET BY MOUTH DAILY FOR CHOLESTEROL 90 tablet 1   triamcinolone (NASACORT) 55 MCG/ACT AERO nasal inhaler Place 2 sprays into the nose 2 (two) times daily. 1 each 2   vitamin B-12 (CYANOCOBALAMIN) 1000 MCG tablet Take 1,000 mcg by mouth daily.     No current facility-administered medications on file prior to visit.    Allergies  Allergen Reactions   Ciprofloxacin Hives and Swelling    SWELLING REACTION UNSPECIFIED    Levofloxacin Hives and Other (See Comments)   Lyrica [Pregabalin] Other (See Comments)    Altered mental status ALTERED MENTAL STATUS HALLUCINATIONS   Compazine [Prochlorperazine Edisylate]     UNSPECIFIED REACTION    Gabapentin Other (See Comments)    Diarrhea and nausea   Sulfamethoxazole-Trimethoprim Other (See Comments)    itch   Xeljanz [Tofacitinib] Hives   Zetia [Ezetimibe]     Stomach cramping   Hydrocodone Itching   Morphine And Codeine Itching   Penicillins Itching   Sulfa Antibiotics Itching    Current Problems (verified) Patient Active Problem List   Diagnosis Date Noted   CAD in native artery 12/03/2020   Carotid stenosis 10/25/2020   Insulin resistance 05/12/2020   Former smoker, 30 pack year, quit 2015 01/09/2020   Aortic atherosclerosis (HCC) by Chest CT scan  May 2021 01/08/2020   Hidradenitis suppurativa 05/16/2019   Morbid obesity (HCC)- BMI 35+ with htn, hld, CVD 11/08/2017   Abnormal glucose 08/02/2017   Cervical spondylosis with radiculopathy 05/30/2017   Rheumatoid arthritis involving multiple sites with positive rheumatoid factor (HCC) 11/14/2015   Sjogren's disease (HCC) 07/05/2014   Medication management 03/26/2014   Hyperlipidemia, mixed 09/24/2013   Vitamin D deficiency 09/24/2013   Intrinsic asthma    Essential hypertension    Hypothyroidism    Degeneration of intervertebral disc of lumbosacral region 05/23/2008    Screening Tests Immunization History  Administered Date(s)  Administered   DTaP 09/15/2012, 09/15/2012   Influenza Inj Mdck Quad With Preservative 08/02/2017   Influenza Whole 09/06/2012   Influenza-Unspecified 05/07/2014, 05/20/2016, 08/02/2017   Pneumococcal Conjugate-13 07/05/2014   Pneumococcal Polysaccharide-23 03/02/2021   Zoster, Live 09/03/2013   Colonoscopy: 11/2021 -Dr. Russella Dar, 10 year follow up  PAP/pelvic: s/p hysterectomy age 73, Dr. Rosemary Holms follows annually MGM: 07/2021, will call to schedule DEXA: 2013, normal  CT Lung Cancer screen 07/2021 benign due in 1 year Zostavax: reports had several years back, severe reaction, declines shingrix Covid 19: declines   Names of Other Physician/Practitioners you currently use: 1. [facility] here for primary care 2. [facility], eye doctor, last visit 2023 - mild cataracts 3. [facility], dentist, last visit 2022, goes q29m   Patient Care Team: Lucky Cowboy, MD as PCP - General (Internal Medicine) Nahser, Deloris Ping, MD as PCP - Cardiology (Cardiology) Meryl Dare, MD as Consulting Physician (  Gastroenterology) Nadara Mustard, MD as Consulting Physician (Orthopedic Surgery) Lurena Nida, MD as Consulting Physician (Rheumatology) Aliene Beams, MD as Consulting Physician (Neurosurgery) Cherlyn Roberts, MD as Consulting Physician (Dermatology) Cherlyn Roberts, MD as Consulting Physician (Dermatology) Charlett Nose, [facility] (Inactive) as Pharmacist (Pharmacist)  SURGICAL HISTORY She  has a past surgical history that includes Thyroidectomy; Upper gastrointestinal endoscopy; Total knee arthroplasty; Vaginal hysterectomy; Knee surgery; Pilonidal cyst excision; Tonsillectomy; Anterior lat lumbar fusion (Left, 01/26/2013); Lumbar percutaneous pedicle screw 1 level (Left, 01/26/2013); Knee Arthroplasty (Left, 02/16/2016); and Anterior cervical decomp/discectomy fusion (N/A, 05/30/2017). FAMILY HISTORY Her family history includes Alzheimer's  disease in her paternal grandfather; Asthma in her brother; Breast cancer (age of onset: 85) in her maternal grandmother; Diabetes in her brother; Heart attack in her paternal grandfather; Heart attack (age of onset: 21) in her father; Hypertension in her father and maternal grandmother; Lung cancer (age of onset: 34) in her father; Other in her daughter; Stroke in her paternal grandmother; Stroke (age of onset: 38) in her father. SOCIAL HISTORY She  reports that she quit smoking about 7 years ago. Her smoking use included cigarettes. She started smoking about 47 years ago. She has a 30 pack-year smoking history. She has never used smokeless tobacco. She reports current alcohol use. She reports that she does not use drugs.   MEDICARE WELLNESS OBJECTIVES: Physical activity:   Cardiac risk factors:   Depression/mood screen:      12/08/2022    9:48 PM  Depression screen PHQ 2/9  Decreased Interest 0  Down, Depressed, Hopeless 0  PHQ - 2 Score 0    ADLs:     12/08/2022    9:48 PM 09/08/2022   11:01 AM  In your present state of health, do you have any difficulty performing the following activities:  Hearing? 0 0  Vision? 0   Difficulty concentrating or making decisions? 0 0  Walking or climbing stairs? 0 0  Dressing or bathing? 0 0  Doing errands, shopping? 0 0     Cognitive Testing  Alert? Yes  Normal Appearance?Yes  Oriented to person? Yes  Place? Yes   Time? Yes  Recall of three objects?  Yes  Can perform simple calculations? Yes  Displays appropriate judgment?Yes  Can read the correct time from a watch face?Yes  EOL planning:    Review of Systems  Constitutional:  Negative for malaise/fatigue and weight loss.  HENT:  Negative for hearing loss and tinnitus.   Eyes:  Negative for blurred vision and double vision.  Respiratory:  Negative for cough, sputum production, shortness of breath and wheezing.   Cardiovascular:  Negative for chest pain, palpitations, orthopnea,  claudication, leg swelling and PND.  Gastrointestinal:  Negative for abdominal pain, blood in stool, constipation, diarrhea, heartburn, melena, nausea and vomiting.  Genitourinary: Negative.   Musculoskeletal:  Positive for joint pain (wrists, follows rheumatology). Negative for falls and myalgias.  Skin:  Negative for rash.  Neurological:  Negative for dizziness, tingling, sensory change, weakness and headaches.  Endo/Heme/Allergies:  Positive for environmental allergies (mild). Negative for polydipsia.  Psychiatric/Behavioral: Negative.  Negative for depression, memory loss, substance abuse and suicidal ideas. The patient is not nervous/anxious and does not have insomnia.   All other systems reviewed and are negative.    Objective:     There were no vitals filed for this visit.  There is no height or weight on file to calculate BMI.  General Appearance: Well nourished, in no apparent distress. Eyes: PERRLA, EOMs,  conjunctiva no swelling or erythema Sinuses: No Frontal/maxillary tenderness ENT/Mouth: Ext aud canals clear, TMs without erythema, bulging. No erythema, swelling, or exudate on post pharynx.  Tonsils not swollen or erythematous. Hearing normal.  Neck: Supple, thyroid normal.  Respiratory: Respiratory effort normal, BS equal bilaterally without rales, rhonchi, wheezing or stridor.  Cardio: RRR with no MRGs. Brisk peripheral pulses without edema.  Abdomen: Soft, + BS.  Non tender, no guarding, rebound, hernias, masses. Lymphatics: Non tender without lymphadenopathy.  Musculoskeletal: Full ROM, 5/5 strength, Mildly antalgic gait. Skin: Warm, dry; intact without rashes, lesions, ecchynosis Neuro: Cranial nerves intact. No cerebellar symptoms.  Psych: Awake and oriented X 3, normal affect, Insight and Judgment appropriate.    Medicare Attestation I have personally reviewed: The patient's medical and social history Their use of alcohol, tobacco or illicit drugs Their  current medications and supplements The patient's functional ability including ADLs,fall risks, home safety risks, cognitive, and hearing and visual impairment Diet and physical activities Evidence for depression or mood disorders  The patient's weight, height, BMI, and visual acuity have been recorded in the chart.  I have made referrals, counseling, and provided education to the patient based on review of the above and I have provided the patient with a written personalized care plan for preventive services.     Raynelle Dick, NP   03/29/2023

## 2023-03-30 ENCOUNTER — Ambulatory Visit: Payer: Medicare Other | Admitting: Nurse Practitioner

## 2023-03-30 DIAGNOSIS — M0579 Rheumatoid arthritis with rheumatoid factor of multiple sites without organ or systems involvement: Secondary | ICD-10-CM

## 2023-03-30 DIAGNOSIS — M3500 Sicca syndrome, unspecified: Secondary | ICD-10-CM

## 2023-03-30 DIAGNOSIS — E039 Hypothyroidism, unspecified: Secondary | ICD-10-CM

## 2023-03-30 DIAGNOSIS — M4722 Other spondylosis with radiculopathy, cervical region: Secondary | ICD-10-CM

## 2023-03-30 DIAGNOSIS — I2510 Atherosclerotic heart disease of native coronary artery without angina pectoris: Secondary | ICD-10-CM

## 2023-03-30 DIAGNOSIS — E782 Mixed hyperlipidemia: Secondary | ICD-10-CM

## 2023-03-30 DIAGNOSIS — Z79899 Other long term (current) drug therapy: Secondary | ICD-10-CM

## 2023-03-30 DIAGNOSIS — R7309 Other abnormal glucose: Secondary | ICD-10-CM

## 2023-03-30 DIAGNOSIS — I10 Essential (primary) hypertension: Secondary | ICD-10-CM

## 2023-03-30 DIAGNOSIS — I700 Atherosclerosis of aorta: Secondary | ICD-10-CM

## 2023-03-30 DIAGNOSIS — E559 Vitamin D deficiency, unspecified: Secondary | ICD-10-CM

## 2023-04-06 NOTE — Progress Notes (Unsigned)
3 MONTH FOLLOW UP  Assessment:    Diagnoses and all orders for this visit  Atherosclerosis of aorta (HCC) - CT 06/2018 Control blood pressure, cholesterol, glucose, increase exercise.   CAD Low risk myoview 01/2021 Dr. Elease Hashimoto following LDL goal <70 - lipid clinic  Control blood pressure, cholesterol, glucose, increase exercise. Reminder to go to the ER if any CP, SOB, nausea, dizziness, severe fatigue   Essential hypertension Continue medication Monitor blood pressure at home; call if consistently over 130/80 Continue DASH diet.   Reminder to go to the ER if any CP, SOB, nausea, dizziness, severe HA, changes vision/speech, left arm numbness and tingling and jaw pain.  Intrinsic asthma Stable on inhalers/meds  Sjogren's syndrome, with unspecified organ involvement (HCC) On Symponi, followed by rheumatology  Rheumatoid arthritis involving multiple sites, unspecified rheumatoid factor presence (HCC) Managed by Dr. Kathi Ludwig, on [facility], has back stimulator Will try Methocarbomol for muscle relaxant  Cervical spondylosis with radiculopathy Currently fairly managed; followed by Dr. Kathi Ludwig and Dr. Bevely Palmer  Degeneration of intervertebral disc of lumbosacral region Continue NSAID PRN, follows rheum  Postoperative hypothyroidism Dr. Talmage Nap continue medications the same pending lab results reminded to take on an empty stomach 30-93mins before food.  check TSH level  Vitamin D deficiency At goal at recent check; continue to recommend supplementation for goal of 70-100 Defer vitamin D level to CPE next OV  Abnormal glucose Off of metformin Discussed disease and risks Discussed diet/exercise, weight management  A1C q66m;   Morbid obesity (HCC) - BMI 35+ with htn, hyperlipidemia  Long discussion about weight loss, diet, and exercise Recommended diet heavy in fruits and veggies and low in animal meats, cheeses, and dairy products, appropriate calorie intake Discussed appropriate  weight for height  Follow up at next visit  Mixed hyperlipidemia Continue medications; rosuvastatin 40 mg daily, repatha q2w Discussed LDL goal <70 Continue low cholesterol diet and exercise.  Defer lipid panel to lipid clinic  Medication management CBC, CMP/GFR, magnesium at routine visits        Over 40 minutes of exam, counseling, chart review and critical decision making was performed Future Appointments  Date Time Provider Department Center  04/07/2023 11:00 AM Raynelle Dick, NP [facility] None  07/06/2023 10:00 AM Lucky Cowboy, MD [facility] None  04/02/2024  2:30 PM Raynelle Dick, NP [facility] None     Plan:   During the course of the visit the patient was educated and counseled about appropriate screening and preventive services including:   Pneumococcal vaccine  Prevnar 13 Influenza vaccine Td vaccine Screening electrocardiogram Bone densitometry screening Colorectal cancer screening Diabetes screening Glaucoma screening Nutrition counseling  Advanced directives: requested   Subjective:  Autumn Frye is a 62 y.o. female who presents for Medicare Annual Wellness Visit and 3 month follow up. She has Intrinsic asthma; Essential hypertension; Hypothyroidism; Hyperlipidemia, mixed; Vitamin D deficiency; Medication management; Sjogren's disease (HCC); Rheumatoid arthritis involving multiple sites with positive rheumatoid factor (HCC); Degeneration of intervertebral disc of lumbosacral region; Cervical spondylosis with radiculopathy; Abnormal glucose; Morbid obesity (HCC)- BMI 35+ with htn, hld, CVD; Hidradenitis suppurativa; Aortic atherosclerosis (HCC) by Chest CT scan  May 2021; Former smoker, 30 pack year, quit 2015; Insulin resistance; Carotid stenosis; and CAD in native artery on their problem list.   Patient is followed by Dr Kathi Ludwig  for Rheumatoid Arthritis and Sjogren's since 2009. Currently on symponi per patient.  Chronic neck/lumbar pain, has  spinal stimulator, no longer needing buprenorphine patches, follow up Dr.  Spiney only as needed. She is having a lot of stiffness, goes 09/25/22 for next infusion- this is 2nd infusion.  She has had good results with Methocarbomol. Spinal stimulator is helping.  Off all pain medications.   She is having feeling of being hot throughout the day, no night sweats.   Not like when she was in menopause, questioning Tumeric/curcumin.   Dr. Talmage Nap with endocrinology following for hypothyroid and hidradenitis suppurativa. She has been on doxycycline 50 mg daily for many years as prophylaxis for this issue.  Asthma is controlled by singulaire daily, BREO PRN if has a cold.  No recent flares.   BMI is There is no height or weight on file to calculate BMI., she has been working on diet and exercise, Has not been taking as much exercise- 2 granddaughter live with them 4 days a week. Does plan to restart exercise Wt Readings from Last 3 Encounters:  12/08/22 208 lb 12.8 oz (94.7 kg)  09/08/22 206 lb 3.2 oz (93.5 kg)  08/17/22 205 lb (93 kg)   Former smoker, 30 pack year history, quit in 2015, had benign lungs on low dose CT screening 07/15/2021  CT did show aortic atherosclerosis and coronary artery calcifications.  CT coronary calcium 11/2020 showed total calcium score 139, 90-100% for age/gender, Dr. Elease Hashimoto is following.  She did have low risk myoview on 01/20/2021.   Her blood pressure has been controlled at home, today their BP is    BP Readings from Last 3 Encounters:  02/19/23 (!) 133/118  12/08/22 120/70  09/08/22 100/62  She does workout. She denies chest pain, shortness of breath, dizziness.   She is on cholesterol medication (rosuvastatin 40 mg daily, repatha q2w and tolerating well, follows with lipid clinic, labile LDLs, reports fasting today except coffee with cream this AM) and denies myalgias. Her LDL cholesterol is not at goal last check, has been at goal previously. The cholesterol last  visit was:   Lab Results  Component Value Date   CHOL 203 (H) 12/08/2022   HDL 124 12/08/2022   LDLCALC 66 12/08/2022   TRIG 54 12/08/2022   CHOLHDL 1.6 12/08/2022    She has been working on diet and exercise for hx of prediabetes formerly on metformin but has been able to taper off, and denies foot ulcerations, increased appetite, nausea, paresthesia of the feet, polydipsia, polyuria and visual disturbances. Last A1C in the office was:  Lab Results  Component Value Date   HGBA1C 5.7 (H) 12/08/2022   She is on thyroid medication. She is currently on Levothyroxine 100 mcg daily Lab Results  Component Value Date   TSH 9.62 (H) 12/08/2022   She is trying to drink lots of water. Last GFR: Lab Results  Component Value Date   EGFR 91 12/08/2022    Patient is on Vitamin D supplement.   Lab Results  Component Value Date   VD25OH 65 12/08/2022      Medication Review: Current Outpatient Medications on File Prior to Visit  Medication Sig Dispense Refill   acetaminophen (TYLENOL) 500 MG tablet Take 1 tablet (500 mg total) by mouth every 6 (six) hours as needed. 30 tablet 0   Acetylcysteine (N-ACETYL-L-CYSTEINE PO) Take 1 capsule by mouth daily.     albuterol (PROVENTIL) (2.5 MG/3ML) 0.083% nebulizer solution Take 3 mLs (2.5 mg total) by nebulization every 6 (six) hours as needed for wheezing or shortness of breath. 60 vial 1   albuterol (VENTOLIN HFA) 108 (90 Base) MCG/ACT inhaler  INHALE 2 PUFFS INTO THE LUNGS EVERY 4 HOURS 15 MINUTES APART AS NEEDED FOR ASTHMA 40.2 g 1   amitriptyline (ELAVIL) 25 MG tablet TAKE 1 TABLET BY MOUTH EVERY MORNING AND 3 TABLETS BY MOUTH AT BEDTIME FOR CHRONIC PAIN 360 tablet 3   ASPIRIN 81 PO Take by mouth daily.     Black Pepper-Turmeric (TURMERIC CURCUMIN) 01-999 MG CAPS Takes 1 capsule  Daily     Cholecalciferol (VITAMIN D3) 5000 UNITS TABS Take 10,000 Units by mouth daily.      CRANBERRY PO Take 1 tablet by mouth 2 (two) times daily.     diclofenac  Sodium (VOLTAREN) 1 % GEL Apply 2 g topically as needed (inflamation).     Eyelid Cleansers (AVENOVA) 0.01 % SOLN Place 1 application into both eyes 2 (two) times daily. Apply to eyelids BID 1 Bottle 1   fexofenadine (ALLEGRA) 180 MG tablet Take 180 mg by mouth daily as needed.     fluticasone furoate-vilanterol (BREO ELLIPTA) 100-25 MCG/ACT AEPB INHALE 1 PUFF BY MOUTH DAILY. 60 each 3   furosemide (LASIX) 40 MG tablet TAKE 1 TABLET BY MOUTH ONCE A WEEK 30 tablet 2   Golimumab (SIMPONI ARIA IV) Inject 2 mg/kg into the vein every 8 (eight) weeks.     levothyroxine (SYNTHROID) 125 MCG tablet Take  1 tablet  Daily  on an empty stomach with only water for 30 minutes & no Antacid meds, Calcium or Magnesium for 4 hours & avoid Biotin                                                                                          /                                                   TAKE                                                       BY                                               MOUTH 90 tablet 1   LOTEMAX 0.5 % GEL INSTILL 1 DROP IN BOTH EYES THREE TIMES DAILY 5 g 0   Magnesium Oxide 500 MG TABS Take 1,000 mg by mouth daily.     Methocarbamol 1000 MG TABS Take 1,000 mg by mouth as needed. 30 tablet 1   MILK THISTLE EXTRACT PO Take 2 capsules by mouth daily.     montelukast (SINGULAIR) 10 MG tablet TAKE 1 TABLET BY MOUTH DAILY FOR ALLERGIES 90 tablet 3   mupirocin ointment (BACTROBAN) 2 % APPLY EXTERNALLY TO THE AFFECTED AREA THREE TIMES DAILY FOR  10 DAYS 22 g 3   olmesartan (BENICAR) 20 MG tablet TAKE 1 TABLET BY MOUTH DAILY FOR BLOOD PRESSURE 90 tablet 1   Omega-3 Fatty Acids (FISH OIL) 1200 MG CAPS Take 2 capsules by mouth daily.      Polyethyl Glycol-Propyl Glycol 0.4-0.3 % SOLN Place 1 drop into both eyes 4 (four) times daily.      REPATHA SURECLICK 140 MG/ML SOAJ ADMINISTER 1 ML UNDER THE SKIN EVERY 14 DAYS 6 mL 3   RESTASIS 0.05 % ophthalmic emulsion Place 1 drop into both eyes daily.   3    rosuvastatin (CRESTOR) 40 MG tablet TAKE 1 TABLET BY MOUTH DAILY FOR CHOLESTEROL 90 tablet 1   triamcinolone (NASACORT) 55 MCG/ACT AERO nasal inhaler Place 2 sprays into the nose 2 (two) times daily. 1 each 2   vitamin B-12 (CYANOCOBALAMIN) 1000 MCG tablet Take 1,000 mcg by mouth daily.     No current facility-administered medications on file prior to visit.    Allergies  Allergen Reactions   Ciprofloxacin Hives and Swelling    SWELLING REACTION UNSPECIFIED    Levofloxacin Hives and Other (See Comments)   Lyrica [Pregabalin] Other (See Comments)    Altered mental status ALTERED MENTAL STATUS HALLUCINATIONS   Compazine [Prochlorperazine Edisylate]     UNSPECIFIED REACTION    Gabapentin Other (See Comments)    Diarrhea and nausea   Sulfamethoxazole-Trimethoprim Other (See Comments)    itch   Xeljanz [Tofacitinib] Hives   Zetia [Ezetimibe]     Stomach cramping   Hydrocodone Itching   Morphine And Codeine Itching   Penicillins Itching   Sulfa Antibiotics Itching    Current Problems (verified) Patient Active Problem List   Diagnosis Date Noted   CAD in native artery 12/03/2020   Carotid stenosis 10/25/2020   Insulin resistance 05/12/2020   Former smoker, 30 pack year, quit 2015 01/09/2020   Aortic atherosclerosis (HCC) by Chest CT scan  May 2021 01/08/2020   Hidradenitis suppurativa 05/16/2019   Morbid obesity (HCC)- BMI 35+ with htn, hld, CVD 11/08/2017   Abnormal glucose 08/02/2017   Cervical spondylosis with radiculopathy 05/30/2017   Rheumatoid arthritis involving multiple sites with positive rheumatoid factor (HCC) 11/14/2015   Sjogren's disease (HCC) 07/05/2014   Medication management 03/26/2014   Hyperlipidemia, mixed 09/24/2013   Vitamin D deficiency 09/24/2013   Intrinsic asthma    Essential hypertension    Hypothyroidism    Degeneration of intervertebral disc of lumbosacral region 05/23/2008    Screening Tests Immunization History  Administered Date(s)  Administered   DTaP 09/15/2012, 09/15/2012   Influenza Inj Mdck Quad With Preservative 08/02/2017   Influenza Whole 09/06/2012   Influenza-Unspecified 05/07/2014, 05/20/2016, 08/02/2017   Pneumococcal Conjugate-13 07/05/2014   Pneumococcal Polysaccharide-23 03/02/2021   Zoster, Live 09/03/2013   Colonoscopy: 11/2021 -Dr. Russella Dar, 10 year follow up  PAP/pelvic: s/p hysterectomy age 79, Dr. Rosemary Holms follows annually MGM: 07/2021, will call to schedule DEXA: 2013, normal  CT Lung Cancer screen 07/2021 benign due in 1 year Zostavax: reports had several years back, severe reaction, declines shingrix Covid 19: declines   Names of Other Physician/Practitioners you currently use: 1. [facility] here for primary care 2. [facility], eye doctor, last visit 2023 - mild cataracts 3. [facility], dentist, last visit 2022, goes q84m   Patient Care Team: Lucky Cowboy, MD as PCP - General (Internal Medicine) Nahser, Deloris Ping, MD as PCP - Cardiology (Cardiology) Meryl Dare, MD as Consulting Physician (Gastroenterology)  Nadara Mustard, MD as Consulting Physician (Orthopedic Surgery) Lurena Nida, MD as Consulting Physician (Rheumatology) Aliene Beams, MD as Consulting Physician (Neurosurgery) Cherlyn Roberts, MD as Consulting Physician (Dermatology) Cherlyn Roberts, MD as Consulting Physician (Dermatology) Charlett Nose, [facility] (Inactive) as Pharmacist (Pharmacist)  SURGICAL HISTORY She  has a past surgical history that includes Thyroidectomy; Upper gastrointestinal endoscopy; Total knee arthroplasty; Vaginal hysterectomy; Knee surgery; Pilonidal cyst excision; Tonsillectomy; Anterior lat lumbar fusion (Left, 01/26/2013); Lumbar percutaneous pedicle screw 1 level (Left, 01/26/2013); Knee Arthroplasty (Left, 02/16/2016); and Anterior cervical decomp/discectomy fusion (N/A, 05/30/2017). FAMILY HISTORY Her family history includes Alzheimer's  disease in her paternal grandfather; Asthma in her brother; Breast cancer (age of onset: 48) in her maternal grandmother; Diabetes in her brother; Heart attack in her paternal grandfather; Heart attack (age of onset: 37) in her father; Hypertension in her father and maternal grandmother; Lung cancer (age of onset: 14) in her father; Other in her daughter; Stroke in her paternal grandmother; Stroke (age of onset: 66) in her father. SOCIAL HISTORY She  reports that she quit smoking about 7 years ago. Her smoking use included cigarettes. She started smoking about 47 years ago. She has a 30 pack-year smoking history. She has never used smokeless tobacco. She reports current alcohol use. She reports that she does not use drugs.   MEDICARE WELLNESS OBJECTIVES: Physical activity:   Cardiac risk factors:   Depression/mood screen:      12/08/2022    9:48 PM  Depression screen PHQ 2/9  Decreased Interest 0  Down, Depressed, Hopeless 0  PHQ - 2 Score 0    ADLs:     12/08/2022    9:48 PM 09/08/2022   11:01 AM  In your present state of health, do you have any difficulty performing the following activities:  Hearing? 0 0  Vision? 0   Difficulty concentrating or making decisions? 0 0  Walking or climbing stairs? 0 0  Dressing or bathing? 0 0  Doing errands, shopping? 0 0     Cognitive Testing  Alert? Yes  Normal Appearance?Yes  Oriented to person? Yes  Place? Yes   Time? Yes  Recall of three objects?  Yes  Can perform simple calculations? Yes  Displays appropriate judgment?Yes  Can read the correct time from a watch face?Yes  EOL planning:    Review of Systems  Constitutional:  Negative for malaise/fatigue and weight loss.  HENT:  Negative for hearing loss and tinnitus.   Eyes:  Negative for blurred vision and double vision.  Respiratory:  Negative for cough, sputum production, shortness of breath and wheezing.   Cardiovascular:  Negative for chest pain, palpitations, orthopnea,  claudication, leg swelling and PND.  Gastrointestinal:  Negative for abdominal pain, blood in stool, constipation, diarrhea, heartburn, melena, nausea and vomiting.  Genitourinary: Negative.   Musculoskeletal:  Positive for joint pain (wrists, follows rheumatology). Negative for falls and myalgias.  Skin:  Negative for rash.  Neurological:  Negative for dizziness, tingling, sensory change, weakness and headaches.  Endo/Heme/Allergies:  Positive for environmental allergies (mild). Negative for polydipsia.  Psychiatric/Behavioral: Negative.  Negative for depression, memory loss, substance abuse and suicidal ideas. The patient is not nervous/anxious and does not have insomnia.   All other systems reviewed and are negative.    Objective:     There were no vitals filed for this visit.  There is no height or weight on file to calculate BMI.  General Appearance: Well nourished, in no apparent distress. Eyes: PERRLA, EOMs, conjunctiva  no swelling or erythema Sinuses: No Frontal/maxillary tenderness ENT/Mouth: Ext aud canals clear, TMs without erythema, bulging. No erythema, swelling, or exudate on post pharynx.  Tonsils not swollen or erythematous. Hearing normal.  Neck: Supple, thyroid normal.  Respiratory: Respiratory effort normal, BS equal bilaterally without rales, rhonchi, wheezing or stridor.  Cardio: RRR with no MRGs. Brisk peripheral pulses without edema.  Abdomen: Soft, + BS.  Non tender, no guarding, rebound, hernias, masses. Lymphatics: Non tender without lymphadenopathy.  Musculoskeletal: Full ROM, 5/5 strength, Mildly antalgic gait. Skin: Warm, dry; intact without rashes, lesions, ecchynosis Neuro: Cranial nerves intact. No cerebellar symptoms.  Psych: Awake and oriented X 3, normal affect, Insight and Judgment appropriate.    Medicare Attestation I have personally reviewed: The patient's medical and social history Their use of alcohol, tobacco or illicit drugs Their  current medications and supplements The patient's functional ability including ADLs,fall risks, home safety risks, cognitive, and hearing and visual impairment Diet and physical activities Evidence for depression or mood disorders  The patient's weight, height, BMI, and visual acuity have been recorded in the chart.  I have made referrals, counseling, and provided education to the patient based on review of the above and I have provided the patient with a written personalized care plan for preventive services.     Raynelle Dick, NP   04/06/2023

## 2023-04-07 ENCOUNTER — Ambulatory Visit (INDEPENDENT_AMBULATORY_CARE_PROVIDER_SITE_OTHER): Payer: Medicare Other | Admitting: Nurse Practitioner

## 2023-04-07 ENCOUNTER — Encounter: Payer: Self-pay | Admitting: Nurse Practitioner

## 2023-04-07 VITALS — BP 124/72 | HR 90 | Temp 97.9°F | Ht 62.5 in | Wt 201.4 lb

## 2023-04-07 DIAGNOSIS — I700 Atherosclerosis of aorta: Secondary | ICD-10-CM

## 2023-04-07 DIAGNOSIS — E039 Hypothyroidism, unspecified: Secondary | ICD-10-CM | POA: Diagnosis not present

## 2023-04-07 DIAGNOSIS — M0579 Rheumatoid arthritis with rheumatoid factor of multiple sites without organ or systems involvement: Secondary | ICD-10-CM | POA: Diagnosis not present

## 2023-04-07 DIAGNOSIS — I10 Essential (primary) hypertension: Secondary | ICD-10-CM | POA: Diagnosis not present

## 2023-04-07 DIAGNOSIS — E559 Vitamin D deficiency, unspecified: Secondary | ICD-10-CM | POA: Diagnosis not present

## 2023-04-07 DIAGNOSIS — Z79899 Other long term (current) drug therapy: Secondary | ICD-10-CM | POA: Diagnosis not present

## 2023-04-07 DIAGNOSIS — M51379 Other intervertebral disc degeneration, lumbosacral region without mention of lumbar back pain or lower extremity pain: Secondary | ICD-10-CM

## 2023-04-07 DIAGNOSIS — M3500 Sicca syndrome, unspecified: Secondary | ICD-10-CM | POA: Diagnosis not present

## 2023-04-07 DIAGNOSIS — I2510 Atherosclerotic heart disease of native coronary artery without angina pectoris: Secondary | ICD-10-CM | POA: Diagnosis not present

## 2023-04-07 DIAGNOSIS — M4722 Other spondylosis with radiculopathy, cervical region: Secondary | ICD-10-CM

## 2023-04-07 DIAGNOSIS — E782 Mixed hyperlipidemia: Secondary | ICD-10-CM

## 2023-04-07 DIAGNOSIS — J45909 Unspecified asthma, uncomplicated: Secondary | ICD-10-CM

## 2023-04-07 DIAGNOSIS — R7309 Other abnormal glucose: Secondary | ICD-10-CM | POA: Diagnosis not present

## 2023-04-07 DIAGNOSIS — M5137 Other intervertebral disc degeneration, lumbosacral region: Secondary | ICD-10-CM

## 2023-04-15 ENCOUNTER — Other Ambulatory Visit: Payer: Self-pay | Admitting: Nurse Practitioner

## 2023-04-15 DIAGNOSIS — E782 Mixed hyperlipidemia: Secondary | ICD-10-CM

## 2023-05-05 DIAGNOSIS — H43812 Vitreous degeneration, left eye: Secondary | ICD-10-CM | POA: Diagnosis not present

## 2023-05-05 DIAGNOSIS — H538 Other visual disturbances: Secondary | ICD-10-CM | POA: Diagnosis not present

## 2023-05-23 IMAGING — CT CT CHEST LUNG CANCER SCREENING LOW DOSE W/O CM
1 series · 15 of 33 positions shown, 19 images · non-contrast
Comparison: CT lung cancer screening dated November 24, 2019

CLINICAL DATA: Former smoker with 40 pack-year history

EXAM:
CT CHEST WITHOUT CONTRAST LOW-DOSE FOR LUNG CANCER SCREENING
TECHNIQUE: Multidetector CT imaging of the chest was performed following the
standard protocol without IV contrast.

[Series 2: ldct screen -soft · axial · 0.76mm/px · z∈[-296,-20]mm · 15 of 65 slices shown, 19 images]
[im 5/65  mediastinal]
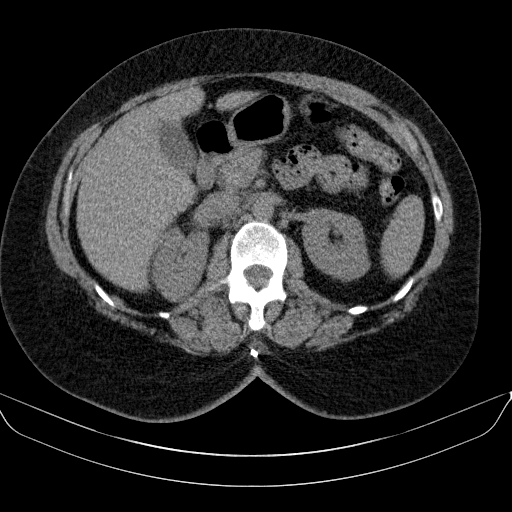
[im 5/65  lung]
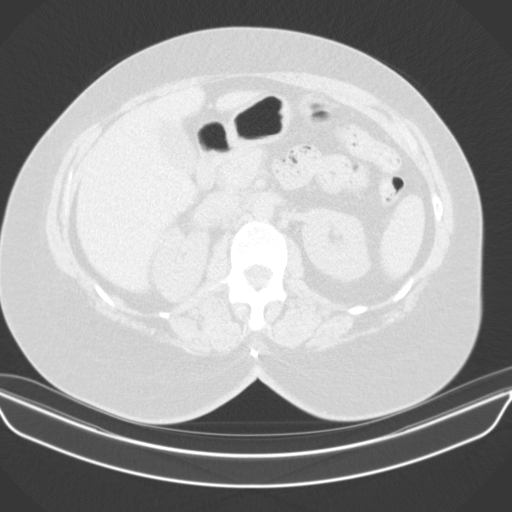
[im 10/65  lung]
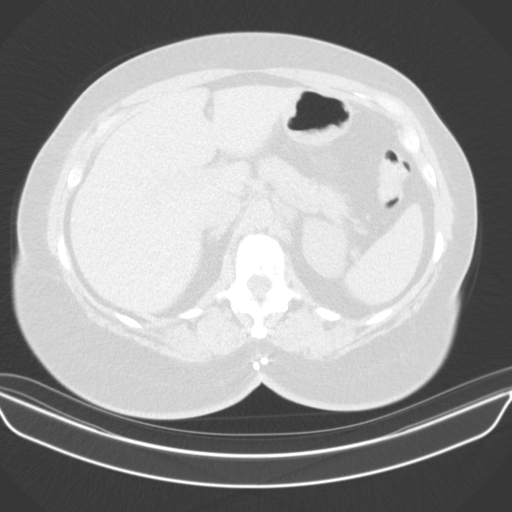
[im 13/65  lung]
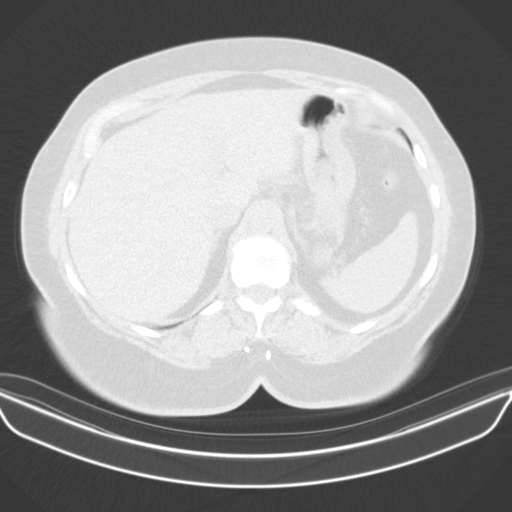
[im 17/65  lung]
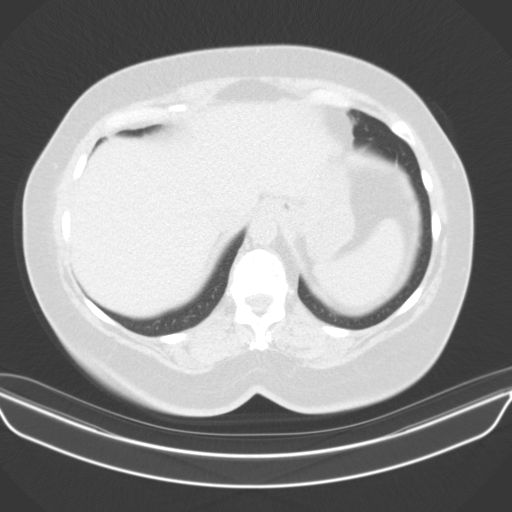
[im 22/65  mediastinal]
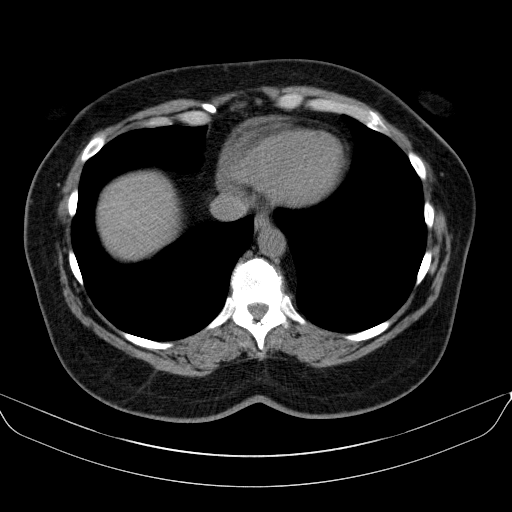
[im 22/65  lung]
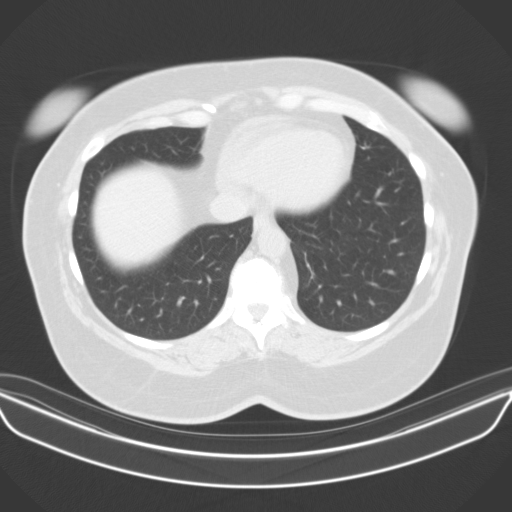
[im 26/65  lung]
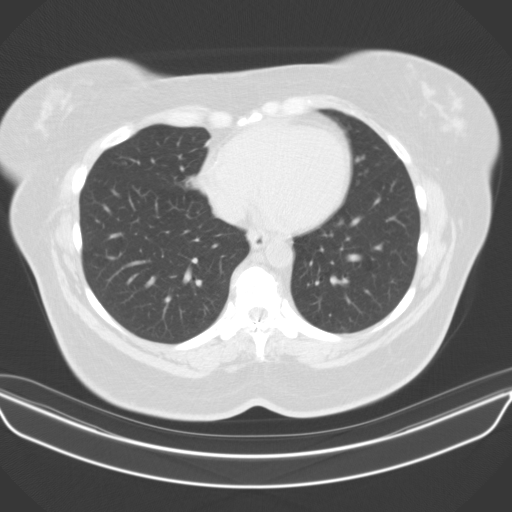
[im 29/65  lung]
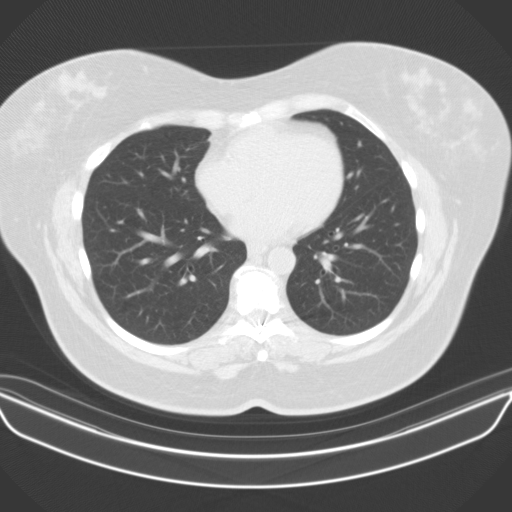
[im 34/65  lung]
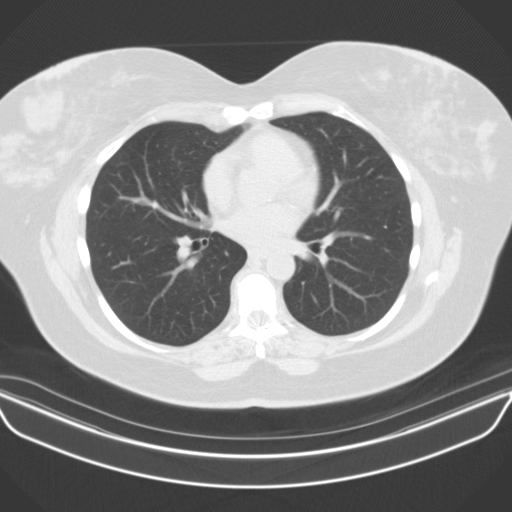
[im 36/65  mediastinal]
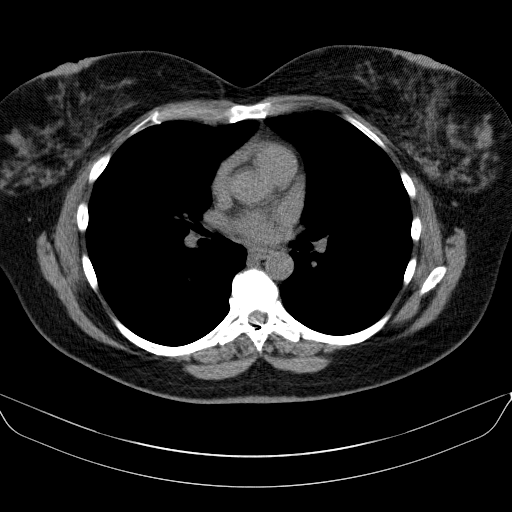
[im 36/65  lung]
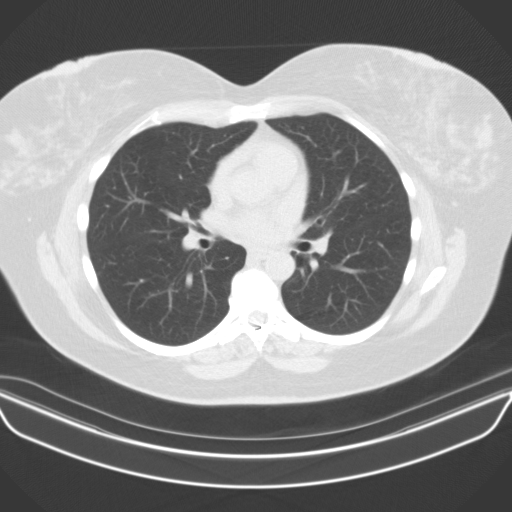
[im 39/65  lung]
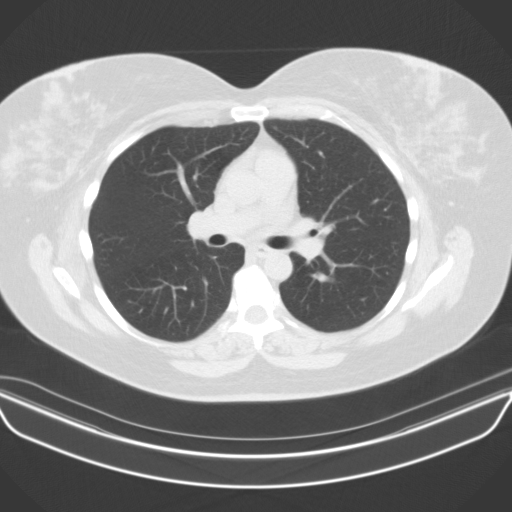
[im 43/65  lung]
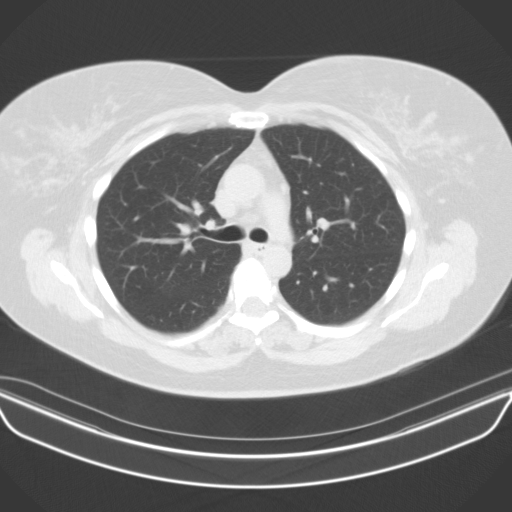
[im 48/65  lung]
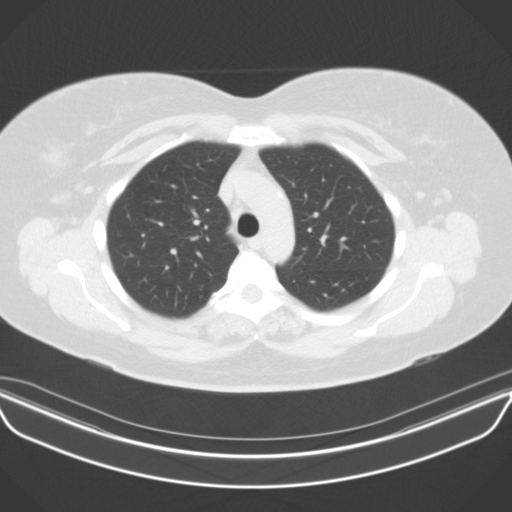
[im 52/65  mediastinal]
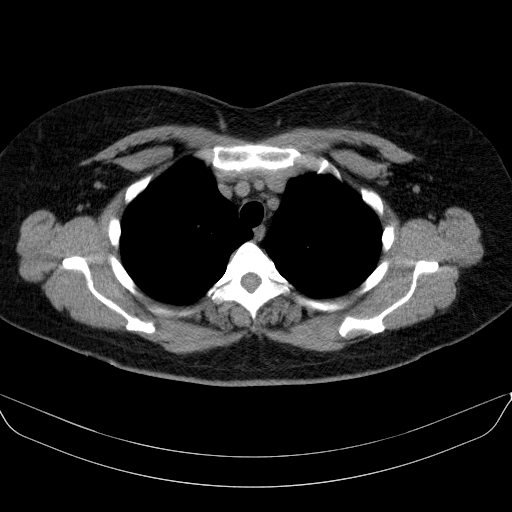
[im 52/65  lung]
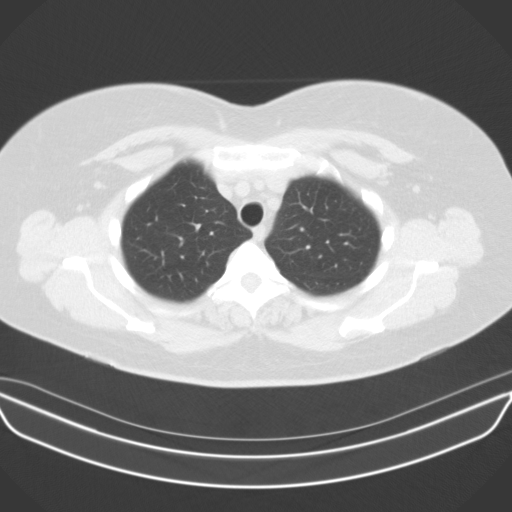
[im 55/65  lung]
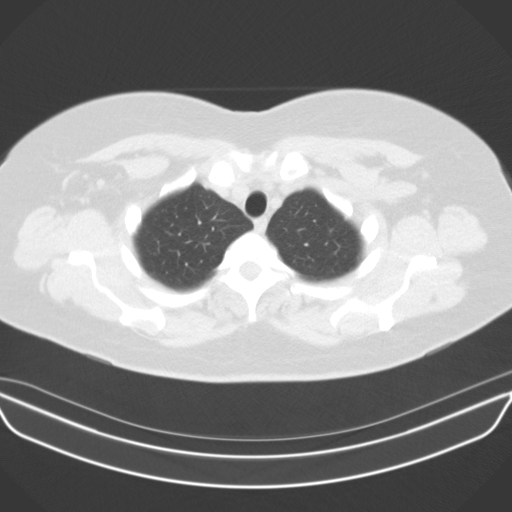
[im 60/65  lung]
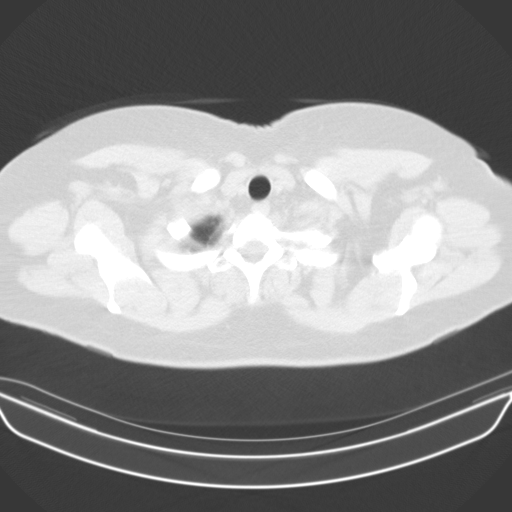

[15 of 33 positions shown; findings below may reference images not displayed]

FINDINGS: Cardiovascular: Normal heart size. Trace pericardial effusion. Mild
coronary artery calcifications of the LAD and circumflex.
Atherosclerotic disease of the thoracic aorta.

Mediastinum/Nodes: Esophagus is unremarkable. No pathologically
enlarged lymph nodes seen in the chest.

Lungs/Pleura: Central airways are patent. No consolidation, pleural
effusion or pneumothorax. Stable small ground-glass pulmonary
nodules.

Upper Abdomen: No acute abnormality.

Musculoskeletal: No chest wall mass or suspicious bone lesions
identified. Spinal cord stimulator device.
IMPRESSION: 1. Lung-RADS 2, benign appearance or behavior. Continue annual
screening with low-dose chest CT without contrast in 12 months.
2. Mild calcifications of the LAD and circumflex coronary arteries,
recommend ASCVD risk assessment.
3. Aortic Atherosclerosis (GUEXH-965.5).

## 2023-06-13 ENCOUNTER — Encounter: Payer: Medicare Other | Admitting: Internal Medicine

## 2023-06-19 ENCOUNTER — Other Ambulatory Visit: Payer: Self-pay | Admitting: Internal Medicine

## 2023-06-19 DIAGNOSIS — E039 Hypothyroidism, unspecified: Secondary | ICD-10-CM

## 2023-06-22 DIAGNOSIS — M0609 Rheumatoid arthritis without rheumatoid factor, multiple sites: Secondary | ICD-10-CM | POA: Diagnosis not present

## 2023-07-06 ENCOUNTER — Encounter: Payer: Medicare Other | Admitting: Internal Medicine

## 2023-07-07 DIAGNOSIS — H43812 Vitreous degeneration, left eye: Secondary | ICD-10-CM | POA: Diagnosis not present

## 2023-07-07 DIAGNOSIS — M3501 Sicca syndrome with keratoconjunctivitis: Secondary | ICD-10-CM | POA: Diagnosis not present

## 2023-07-07 DIAGNOSIS — E119 Type 2 diabetes mellitus without complications: Secondary | ICD-10-CM | POA: Diagnosis not present

## 2023-07-14 ENCOUNTER — Encounter: Payer: Self-pay | Admitting: Internal Medicine

## 2023-07-14 ENCOUNTER — Ambulatory Visit (INDEPENDENT_AMBULATORY_CARE_PROVIDER_SITE_OTHER): Payer: Medicare Other | Admitting: Internal Medicine

## 2023-07-14 VITALS — BP 110/70 | HR 90 | Temp 97.9°F | Resp 16 | Ht 62.5 in | Wt 194.0 lb

## 2023-07-14 DIAGNOSIS — Z136 Encounter for screening for cardiovascular disorders: Secondary | ICD-10-CM | POA: Diagnosis not present

## 2023-07-14 DIAGNOSIS — E782 Mixed hyperlipidemia: Secondary | ICD-10-CM | POA: Diagnosis not present

## 2023-07-14 DIAGNOSIS — Z0000 Encounter for general adult medical examination without abnormal findings: Secondary | ICD-10-CM | POA: Diagnosis not present

## 2023-07-14 DIAGNOSIS — I10 Essential (primary) hypertension: Secondary | ICD-10-CM | POA: Diagnosis not present

## 2023-07-14 DIAGNOSIS — E559 Vitamin D deficiency, unspecified: Secondary | ICD-10-CM

## 2023-07-14 DIAGNOSIS — M0579 Rheumatoid arthritis with rheumatoid factor of multiple sites without organ or systems involvement: Secondary | ICD-10-CM

## 2023-07-14 DIAGNOSIS — Z79899 Other long term (current) drug therapy: Secondary | ICD-10-CM

## 2023-07-14 DIAGNOSIS — E88819 Insulin resistance, unspecified: Secondary | ICD-10-CM

## 2023-07-14 DIAGNOSIS — Z8249 Family history of ischemic heart disease and other diseases of the circulatory system: Secondary | ICD-10-CM

## 2023-07-14 DIAGNOSIS — Z0001 Encounter for general adult medical examination with abnormal findings: Secondary | ICD-10-CM

## 2023-07-14 DIAGNOSIS — M3500 Sicca syndrome, unspecified: Secondary | ICD-10-CM

## 2023-07-14 DIAGNOSIS — Z87891 Personal history of nicotine dependence: Secondary | ICD-10-CM

## 2023-07-14 DIAGNOSIS — R7309 Other abnormal glucose: Secondary | ICD-10-CM | POA: Diagnosis not present

## 2023-07-14 DIAGNOSIS — B351 Tinea unguium: Secondary | ICD-10-CM

## 2023-07-14 DIAGNOSIS — I2510 Atherosclerotic heart disease of native coronary artery without angina pectoris: Secondary | ICD-10-CM

## 2023-07-14 DIAGNOSIS — Z1211 Encounter for screening for malignant neoplasm of colon: Secondary | ICD-10-CM

## 2023-07-14 DIAGNOSIS — I700 Atherosclerosis of aorta: Secondary | ICD-10-CM

## 2023-07-14 DIAGNOSIS — E66812 Obesity, class 2: Secondary | ICD-10-CM

## 2023-07-14 MED ORDER — TERBINAFINE HCL 250 MG PO TABS: ORAL_TABLET | ORAL | 0 refills | Status: AC

## 2023-07-14 MED ORDER — TIRZEPATIDE-WEIGHT MANAGEMENT 2.5 MG/0.5ML ~~LOC~~ SOLN: 2.5000 mg | SUBCUTANEOUS | 11 refills | Status: AC

## 2023-07-14 NOTE — Progress Notes (Signed)
Annual Screening/Preventative Visit & Comprehensive Evaluation &  Examination   Future Appointments  Date Time Provider Department  07/14/2023  3:00 PM Lucky Cowboy, MD [facility]  04/02/2024  2:30 PM Raynelle Dick, NP [facility]  07/31/2024  2:00 PM Lucky Cowboy, MD [facility]        This very nice 62 y.o. MWF with  HTN, ASCAD, Rheumatoid Arthritis,  HLD, Prediabetes, Hypothyroidism, Morbid Obesity and Vitamin D Deficiency  presents for a Screening /Preventative Visit & comprehensive evaluation and management of multiple medical co-morbidities.          In Oct 15, 2022 she had a LD Screening Chest CT scan. Former smoker, 40 pack year history, quit in 2015.  On Jul 07, 2021 she had a 2sd Neg LD Screening Chest CT for Lung cancer which also showed mild LAD & Cx calcifications & Thoracic Aortic Atherosclerosis.  She was referred to Dr Melburn Popper who recommended an exercise Nuclear Stress which was interpreted  Normal & Low Risk.                                            Patient has been on SS Disability for Rheumatoid Arthritis since 2009 and she is  on Simponi   followed by Dr Kathi Ludwig.  She has chronic neck & back pain attributed to RA & DDD.          HTN predates since  63 . Patient's BP has been controlled at home. Chest CT scan in  May 2021 showed Cor Aa Atherosclerosis.  Patient denies any cardiac symptoms as chest pain, palpitations, shortness of breath, dizziness or ankle swelling. Today's BP is at goal -110/70 .        Patient's hyperlipidemia is controlled with diet and Rosuvastatin  /Repatha. Patient denies myalgias or other medication SE's. Last lipids were at goal:  Lab Results  Component Value Date   CHOL 174 04/07/2023   HDL 89 04/07/2023   LDLCALC 70 04/07/2023   TRIG 66 04/07/2023   CHOLHDL 2.0 04/07/2023         Patient has hx/o Morbid Obesity (BMI 36.7+)  and Prediabetes (A1c 5.9% /2010) and  in 2019 was started on Metformin by Dr Horald Pollen for Insulin  Resistance.   Patient denies reactive hypoglycemic symptoms, visual blurring, diabetic polys or paresthesias. Last A1c was near goal:  Lab Results  Component Value Date   HGBA1C 5.7 (H) 12/08/2022                                            The patient had a Thyroidectomy in 1995 for thyroid cancer and has been on replacement /suppressive therapy since managed by Dr Horald Pollen. 4 of her last 5 TSH's appear over suppressed (last was 0.25 in August) .          Finally, patient has history of Vitamin D Deficiency ("27" /2009 & "37" /2016)  and last Vitamin D was at goal :  Lab Results  Component Value Date   VD25OH 65 12/08/2022      Current Outpatient Medications  Medication Instructions   acetaminophen 500 mg Every 6 hours PRN   Acetylcysteine  1 capsule Daily   Albuterol  2.5 mg by Neb Every 6 hours  PRN   Albuterol  HFA  inhaler 2  INHALATIONS EVERY 4 HRS AS NEEDED    amitriptyline 25 MG tablet Take 1 tablet every Morning & 3 tablets at Bedtime for Chronic Pain   ASPIRIN 81  Daily   BELBUCA 150 MCG FILM 1 Strip(s)  Every 12 Hours   TURMERIC CURCUMIN 01-999 MG  Takes 1 capsule  Daily   CRANBERRY PO 1 tablet 2 times daily   VITAMIN B12    1,000 mcg  Daily   diclofenac 2 g   Topical As needed   Evolocumab (REPATHA ) 140 MG/ML  1 mL, Subcut, Every 14 days   Eyelid Cleansers (AVENOVA) 0.01 % SOLN 1 application , Both Eyes, 2 times daily,   Fexofenadine 180 mg Daily PRN   BREO ELLIPTA   100-25  Use  1 Inhalation Daily   furosemide 40 mg Weekly   Golimumab (SIMPONI ARIA IV) 2 mg/kg, Intravenous, Every 8 weeks   METHYL FOLATE 5,000 mg   Daily   levothyroxine 100 MCG tablet Take  1 tablet  Daily    LOTEMAX 0.5 % GEL INSTILL 1 DROP IN BOTH EYES THREE TIMES DAILY   Magnesium   1,000 mg Daily   MILK THISTLE EXTRACT  2 capsules  Daily   montelukast  10 MG tablet TAKE 1 TABLET DAILY FOR ALLERGIES   olmesartan  20 MG tablet TAKE 1 TABLET  DAILY    Omega-3 FISH OIL 1200 MG CAPS 2 capsules  Daily    Polyethyl Glycol 0.4-0.3 % SOLN 1 drop, Both Eyes, 4 times daily   RESTASIS 0.05 % ophth emulsion 1 drop, Both Eyes, Daily   rosuvastatin  40 MG tablet TAKE 1 TABLET  DAILY    NASACORT nasal inhaler 1 spray, Nasal, 2 times daily   Vitamin D  10,000 Units Daily    Allergies  Allergen Reactions   Ciprofloxacin Hives and Swelling    SWELLING REACTION UNSPECIFIED    Levaquin [Levofloxacin] Hives   Lyrica [Pregabalin] Other (See Comments)    Altered mental status ALTERED MENTAL STATUS HALLUCINATIONS   Compazine [Prochlorperazine Edisylate]     UNSPECIFIED REACTION    Gabapentin     Diarrhea and nausea   Septra [Sulfamethoxazole-Trimethoprim]     itch   Zetia [Ezetimibe]     Stomach cramping   Hydrocodone Itching   Morphine And Related Itching   Penicillins Itching   Sulfa Antibiotics Itching     Past Medical History:  Diagnosis Date   Arthritis    OA, RA   Asthma    with a cold   Autoimmune deficiency syndrome (HCC)    Back pain    Cancer (HCC)    thyroid-1995   Family history of adverse reaction to anesthesia    mother problems with n/v   Fibromyalgia    GERD (gastroesophageal reflux disease)    Hepatitis    'A" when pt. was 12 yrs. old   Hiatal hernia    Hyperlipidemia    Hypertension    Hypothyroidism    Pneumonia    S/P total knee arthroplasty 02/16/2016     Health Maintenance  Topic Date Due   COVID-19 Vaccine (1) Never done   Zoster Vaccines- Shingrix (1 of 2) Never done   INFLUENZA VACCINE  04/06/2021   MAMMOGRAM  07/02/2021   TETANUS/TDAP  09/15/2022   Hepatitis C Screening  Completed   HIV Screening  Completed   HPV VACCINES  Aged Out   PAP SMEAR-Modifier  Discontinued     Immunization History  Administered Date(s) Administered   DTaP 09/15/2012, 09/15/2012   Influenza Inj Mdck Quad With Preservative 08/02/2017   Influenza Whole 09/06/2012   Influenza-Unspecified 05/07/2014, 05/20/2016, 08/02/2017   Pneumococcal Conjugate-13 07/05/2014    Pneumococcal Polysaccharide-23 03/02/2021   Zoster, Live 09/03/2013    Last Colon - 12/02/2021 - Dr Russella Dar - Recc 10 year f/u due Apr 2033   Last MGM - 10/19/2022 - overdue   Past Surgical History:  Procedure Laterality Date   ANTERIOR CERVICAL DECOMP/DISCECTOMY FUSION N/A 05/30/2017   Procedure: Cervical five-six, Cervical six-seven Anterior discectomy with fusion and plate fixation;  Surgeon: Ditty, Loura Halt, MD;  Location: [facility] OR;  Service: Neurosurgery;  Laterality: N/A;   ANTERIOR LAT LUMBAR FUSION Left 01/26/2013   Procedure: ANTERIOR LATERAL LUMBAR FUSION 1 LEVEL;  Surgeon: Reinaldo Meeker, MD;  Location: [facility];  Service: Neurosurgery;  Laterality: Left;  lumbar four-five   KNEE ARTHROPLASTY Left 02/16/2016   Procedure: COMPUTER ASSISTED TOTAL KNEE ARTHROPLASTY;  Surgeon: Donato Heinz, MD;  Location: [facility] ORS;  Service: Orthopedics;  Laterality: Left;   KNEE SURGERY     3 on right knee, 1 on left-prior to TKR   LUMBAR PERCUTANEOUS PEDICLE SCREW 1 LEVEL Left 01/26/2013   Procedure: LUMBAR PERCUTANEOUS PEDICLE SCREW 1 LEVEL;  Surgeon: Reinaldo Meeker, MD;  Location: [facility];  Service: Neurosurgery;  Laterality: Left;  lumbar four-five   PILONIDAL CYST EXCISION     THYROIDECTOMY     TONSILLECTOMY     TOTAL KNEE ARTHROPLASTY     right   UPPER GASTROINTESTINAL ENDOSCOPY     VAGINAL HYSTERECTOMY       Family History  Problem Relation Age of Onset   Hypertension Father    Stroke Father 67   Heart attack Father 99   Lung cancer Father 7       smoker   Diabetes Brother    Asthma Brother    Other Daughter        XED CONNECTIVE TISSUE   Breast cancer Maternal Grandmother 63   Hypertension Maternal Grandmother    Stroke Paternal Grandmother        late in life   Heart attack Paternal Grandfather        late in life   Alzheimer's disease Paternal Grandfather        late onset   Colon cancer Neg Hx    Esophageal cancer Neg Hx    Stomach cancer Neg Hx     Rectal cancer Neg Hx      Social History   Tobacco Use   Smoking status: Former    Packs/day: 0.75    Years: 40.00    Pack years: 30.00    Types: Cigarettes    Quit date: 07/05/2015    Years since quitting: 5.9   Smokeless tobacco: Never  Substance Use Topics   Alcohol use: Yes    Alcohol/week: 0.0 standard drinks    Comment: rare   Drug use: No      ROS Constitutional: Denies fever, chills, weight loss/gain, headaches, insomnia,  night sweats, and change in appetite. Does c/o fatigue. Eyes: Denies redness, blurred vision, diplopia, discharge, itchy, watery eyes.  ENT: Denies discharge, congestion, post nasal drip, epistaxis, sore throat, earache, hearing loss, dental pain, Tinnitus, Vertigo, Sinus pain, snoring.  Cardio: Denies chest pain, palpitations, irregular heartbeat, syncope, dyspnea, diaphoresis, orthopnea, PND, claudication, edema Respiratory: denies cough, dyspnea, DOE, pleurisy, hoarseness, laryngitis, wheezing.  Gastrointestinal: Denies dysphagia, heartburn, reflux, water brash, pain, cramps, nausea, vomiting, bloating, diarrhea, constipation, hematemesis, melena, hematochezia, jaundice, hemorrhoids Genitourinary: Denies dysuria, frequency, urgency, nocturia, hesitancy, discharge, hematuria, flank pain Breast: Breast lumps, nipple discharge, bleeding.  Musculoskeletal: Denies arthralgia, myalgia, stiffness, Jt. Swelling, pain, limp, and strain/sprain. Denies falls. Skin: Denies puritis, rash, hives, warts, acne, eczema, changing in skin lesion Neuro: No weakness, tremor, incoordination, spasms, paresthesia, pain Psychiatric: Denies confusion, memory loss, sensory loss. Denies Depression. Endocrine: Denies change in weight, skin, hair change, nocturia, and paresthesia, diabetic polys, visual blurring, hyper / hypo glycemic episodes.  Heme/Lymph: No excessive bleeding, bruising, enlarged lymph nodes.  Physical Exam  BP 110/70   Pulse 90   Temp 97.9 F (36.6  C)   Resp 16   Ht 5' 2.5" (1.588 m)   Wt 194 lb (88 kg)   LMP  (LMP Unknown)   SpO2 96%   BMI 34.92 kg/m   General Appearance:   Over nourished  and in no apparent distress.  Eyes: PERRLA, EOMs, conjunctiva no swelling or erythema, normal fundi and vessels. Sinuses: No frontal/maxillary tenderness ENT/Mouth: EACs patent / TMs  nl. Nares clear without erythema, swelling, mucoid exudates. Oral hygiene is good. No erythema, swelling, or exudate. Tongue normal, non-obstructing. Tonsils not swollen or erythematous. Hearing normal.  Neck: Supple, thyroid not palpable. No bruits, nodes or JVD. Respiratory: Respiratory effort normal.  BS equal and clear bilateral without rales, rhonci, wheezing or stridor. Cardio: Heart sounds are normal with regular rate and rhythm and no murmurs, rubs or gallops. Peripheral pulses are normal and equal bilaterally without edema. No aortic or femoral bruits. Chest: symmetric with normal excursions and percussion. Breasts: Symmetric, without lumps, nipple discharge, retractions, or fibrocystic changes.  Abdomen: Flat, soft with bowel sounds active. Nontender, no guarding, rebound, hernias, masses, or organomegaly.  Lymphatics: Non tender without lymphadenopathy.  Musculoskeletal: Full ROM all peripheral extremities, joint stability, 5/5 strength, and normal gait. Skin: Warm and dry without rashes, lesions, cyanosis, clubbing or  ecchymosis.  Neuro: Cranial nerves intact, reflexes equal bilaterally. Normal muscle tone, no cerebellar symptoms. Sensation intact.  Pysch: Alert and oriented X 3, normal affect, Insight and Judgment appropriate.    Assessment and Plan   1. Annual Preventative Screening Examination   2. Essential hypertension  - EKG 12-Lead - Urinalysis, Routine w reflex microscopic - Microalbumin / creatinine urine ratio - CBC with Differential/Platelet - COMPLETE METABOLIC PANEL WITH GFR - Magnesium - TSH  3. Hyperlipidemia, mixed  -  EKG 12-Lead - Lipid panel - TSH  4. Abnormal glucose  - EKG 12-Lead - Hemoglobin A1c - Insulin, random  5. Insulin resistance  - EKG 12-Lead - Hemoglobin A1c - Insulin, random  6. Vitamin D deficiency  - VITAMIN D 25 Hydroxy   7. Hypothyroidism  - TSH  8. CAD in native artery  - EKG 12-Lead  9. Aortic atherosclerosis (HCC) by Chest CT scan  May 2021  - EKG 12-Lead - Lipid panel  10. Rheumatoid arthritis involving multiple sites with positive rheumatoid factor (HCC)   11. Sjogren's syndrome  (HCC)   12. Class 2 severe obesity with serious comorbidity and body mass                                  index (BMI) of 37.0 to 37.9 in adult, unspecified obesity type (HCC)  - TSH  13. Screening for heart disease  - EKG  12-Lead  14. FHx: heart disease  - EKG 12-Lead  15. Former smoker, 30 pack year, quit 2015  - EKG 12-Lead  16. Medication management  - Urinalysis, Routine w reflex microscopic - Microalbumin / creatinine urine ratio - CBC with Differential/Platelet - COMPLETE METABOLIC PANEL WITH GFR - Magnesium - Lipid panel - TSH - Hemoglobin A1c - Insulin, random - VITAMIN D 25 Hydroxy         Patient was counseled in prudent diet to achieve/maintain BMI less than 25 for weight control, BP monitoring, regular exercise and medications. Discussed med's effects and SE's. Screening labs and tests as requested with regular follow-up as recommended. Over 40 minutes of exam, counseling, chart review and high complex critical decision making was performed.   Marinus Maw, MD

## 2023-07-14 NOTE — Patient Instructions (Signed)
Due to recent changes in healthcare laws, you may see the results of your imaging and laboratory studies on MyChart before your provider has had a chance to review them.  We understand that in some cases there may be results that are confusing or concerning to you. Not all laboratory results come back in the same time frame and the provider may be waiting for multiple results in order to interpret others.  Please give [place] 48 hours in order for your provider to thoroughly review all the results before contacting the office for clarification of your results.   ++++++++++++++++++++++++++++++  Vit D  & Vit C 1,000 mg   are recommended to help protect  against the Covid-19 and other Corona viruses.    Also it's recommended  to take  Zinc 50 mg  to help  protect against the Covid-19   and best place to get  is also on [email or web address]  and don't pay more than 6-8 cents /pill !  ================================ Coronavirus (COVID-19) Are you at risk?  Are you at risk for the Coronavirus (COVID-19)?  To be considered HIGH RISK for Coronavirus (COVID-19), you have to meet the following criteria:  Traveled to [place], [place], [place], [place] or [place]; or in the [place] to [place], [place], [place]  or [place]; and have fever, cough, and shortness of breath within the last 2 weeks of travel OR Been in close contact with a person diagnosed with COVID-19 within the last 2 weeks and have  fever, cough,and shortness of breath  IF YOU DO NOT MEET THESE CRITERIA, YOU ARE CONSIDERED LOW RISK FOR COVID-19.  What to do if you are HIGH RISK for COVID-19?  If you are having a medical emergency, call 911. Seek medical care right away. Before you go to a doctor's office, urgent care or emergency department,  call ahead and tell them about your recent travel, contact with someone diagnosed with COVID-19   and your symptoms.  You should receive instructions from your physician's office regarding  next steps of care.  When you arrive at healthcare provider, tell the healthcare staff immediately you have returned from  visiting [place], [place], [place], [place] or [place]; or traveled in the [place] to [place], [place],  [place] or [place] in the last two weeks or you have been in close contact with a person diagnosed with  COVID-19 in the last 2 weeks.   Tell the health care staff about your symptoms: fever, cough and shortness of breath. After you have been seen by a medical provider, you will be either: Tested for (COVID-19) and discharged home on quarantine except to seek medical care if  symptoms worsen, and asked to  Stay home and avoid contact with others until you get your results (4-5 days)  Avoid travel on public transportation if possible (such as bus, train, or airplane) or Sent to the Emergency Department by EMS for evaluation, COVID-19 testing  and  possible admission depending on your condition and test results.  What to do if you are LOW RISK for COVID-19?  Reduce your risk of any infection by using the same precautions used for avoiding the common cold or flu:  Wash your hands often with soap and warm water for at least 20 seconds.  If soap and water are not readily available,  use an alcohol-based hand sanitizer with at least 60% alcohol.  If coughing or sneezing, cover your mouth and nose by coughing  or sneezing into the elbow areas of your shirt or coat,  into a tissue or into your sleeve (not your hands). Avoid shaking hands with others and consider head nods or verbal greetings only. Avoid touching your eyes, nose, or mouth with unwashed hands.  Avoid close contact with people who are sick. Avoid places or events with large numbers of people in one location, like concerts or sporting events. Carefully consider travel plans you have or are making. If you are planning any travel outside or inside the [place], visit the CDC's Travelers' Health webpage for  the latest health notices. If you have some symptoms but not all symptoms, continue to monitor at home and seek medical attention  if your symptoms worsen. If you are having a medical emergency, call 911. >>>>>>>>>>>>>>>>>>>>>>> Preventive Care for Adults  A healthy lifestyle and preventive care can promote health and wellness. Preventive health guidelines for women include the following key practices. A routine yearly physical is a good way to check with your health care provider about your health and preventive screening. It is a chance to share any concerns and updates on your health and to receive a thorough exam. Visit your dentist for a routine exam and preventive care every 6 months. Brush your teeth twice a day and floss once a day. Good oral hygiene prevents tooth decay and gum disease. The frequency of eye exams is based on your age, health, family medical history, use of contact lenses, and other factors. Follow your health care provider's recommendations for frequency of eye exams. Eat a healthy diet. Foods like vegetables, fruits, whole grains, low-fat dairy products, and lean protein foods contain the nutrients you need without too many calories. Decrease your intake of foods high in solid fats, added sugars, and salt. Eat the right amount of calories for you. Get information about a proper diet from your health care provider, if necessary. Regular physical exercise is one of the most important things you can do for your health. Most adults should get at least 150 minutes of moderate-intensity exercise (any activity that increases your heart rate and causes you to sweat) each week. In addition, most adults need muscle-strengthening exercises on 2 or more days a week. Maintain a healthy weight. The body mass index (BMI) is a screening tool to identify possible weight problems. It provides an estimate of body fat based on height and weight. Your health care provider can find your BMI and can  help you achieve or maintain a healthy weight. For adults 20 years and older: A BMI below 18.5 is considered underweight. A BMI of 18.5 to 24.9 is normal. A BMI of 25 to 29.9 is considered overweight. A BMI of 30 and above is considered obese. Maintain normal blood lipids and cholesterol levels by exercising and minimizing your intake of saturated fat. Eat a balanced diet with plenty of fruit and vegetables. Blood tests for lipids and cholesterol should begin at age 43 and be repeated every 5 years. If your lipid or cholesterol levels are high, you are over 50, or you are at high risk for heart disease, you may need your cholesterol levels checked more frequently. Ongoing high lipid and cholesterol levels should be treated with medicines if diet and exercise are not working. If you smoke, find out from your health care provider how to quit. If you do not use tobacco, do not start. Lung cancer screening is recommended for adults aged 11-80 years who are at high risk for developing  lung cancer because of a history of smoking. A yearly low-dose CT scan of the lungs is recommended for people who have at least a 30-pack-year history of smoking and are a current smoker or have quit within the past 15 years. A pack year of smoking is smoking an average of 1 pack of cigarettes a day for 1 year (for example: 1 pack a day for 30 years or 2 packs a day for 15 years). Yearly screening should continue until the smoker has stopped smoking for at least 15 years. Yearly screening should be stopped for people who develop a health problem that would prevent them from having lung cancer treatment. High blood pressure causes heart disease and increases the risk of stroke. Your blood pressure should be checked at least every 1 to 2 years. Ongoing high blood pressure should be treated with medicines if weight loss and exercise do not work. If you are 32-3 years old, ask your health care provider if you should take aspirin to  prevent strokes. Diabetes screening involves taking a blood sample to check your fasting blood sugar level. This should be done once every 3 years, after age 32, if you are within normal weight and without risk factors for diabetes. Testing should be considered at a younger age or be carried out more frequently if you are overweight and have at least 1 risk factor for diabetes. Breast cancer screening is essential preventive care for women. You should practice "breast self-awareness." This means understanding the normal appearance and feel of your breasts and may include breast self-examination. Any changes detected, no matter how small, should be reported to a health care provider. Women in their 45s and 30s should have a clinical breast exam (CBE) by a health care provider as part of a regular health exam every 1 to 3 years. After age 83, women should have a CBE every year. Starting at age 92, women should consider having a mammogram (breast X-ray test) every year. Women who have a family history of breast cancer should talk to their health care provider about genetic screening. Women at a high risk of breast cancer should talk to their health care providers about having an MRI and a mammogram every year. Breast cancer gene (BRCA)-related cancer risk assessment is recommended for women who have family members with BRCA-related cancers. BRCA-related cancers include breast, ovarian, tubal, and peritoneal cancers. Having family members with these cancers may be associated with an increased risk for harmful changes (mutations) in the breast cancer genes BRCA1 and BRCA2. Results of the assessment will determine the need for genetic counseling and BRCA1 and BRCA2 testing. Routine pelvic exams to screen for cancer are no longer recommended for nonpregnant women who are considered low risk for cancer of the pelvic organs (ovaries, uterus, and vagina) and who do not have symptoms. Ask your health care provider if a  screening pelvic exam is right for you. If you have had past treatment for cervical cancer or a condition that could lead to cancer, you need Pap tests and screening for cancer for at least 20 years after your treatment. If Pap tests have been discontinued, your risk factors (such as having a new sexual partner) need to be reassessed to determine if screening should be resumed. Some women have medical problems that increase the chance of getting cervical cancer. In these cases, your health care provider may recommend more frequent screening and Pap tests. Colorectal cancer can be detected and often prevented. Most routine colorectal  cancer screening begins at the age of 10 years and continues through age 19 years. However, your health care provider may recommend screening at an earlier age if you have risk factors for colon cancer. On a yearly basis, your health care provider may provide home test kits to check for hidden blood in the stool. Use of a small camera at the end of a tube, to directly examine the colon (sigmoidoscopy or colonoscopy), can detect the earliest forms of colorectal cancer. Talk to your health care provider about this at age 28, when routine screening begins.  Direct exam of the colon should be repeated every 5-10 years through age 71 years, unless early forms of pre-cancerous polyps or small growths are found. Hepatitis C blood testing is recommended for all people born from [birth year] through 1965 and any individual with known risks for hepatitis C.  Osteoporosis is a disease in which the bones lose minerals and strength with aging. This can result in serious bone fractures or breaks. The risk of osteoporosis can be identified using a bone density scan. Women ages 21 years and over and women at risk for fractures or osteoporosis should discuss screening with their health care providers. Ask your health care provider whether you should take a calcium supplement or vitamin D to reduce the rate  of osteoporosis. Menopause can be associated with physical symptoms and risks. Hormone replacement therapy is available to decrease symptoms and risks. You should talk to your health care provider about whether hormone replacement therapy is right for you. Use sunscreen. Apply sunscreen liberally and repeatedly throughout the day. You should seek shade when your shadow is [place] than you. Protect yourself by wearing long sleeves, pants, a wide-brimmed hat, and sunglasses year round, whenever you are outdoors. Once a month, do a whole body skin exam, using a mirror to look at the skin on your back. Tell your health care provider of new moles, moles that have irregular borders, moles that are larger than a pencil eraser, or moles that have changed in shape or color. Stay current with required vaccines (immunizations). Influenza vaccine. All adults should be immunized every year. Tetanus, diphtheria, and acellular pertussis (Td, Tdap) vaccine. Pregnant women should receive 1 dose of Tdap vaccine during each pregnancy. The dose should be obtained regardless of the length of time since the last dose. Immunization is preferred during the 27th-36th week of gestation. An adult who has not previously received Tdap or who does not know her vaccine status should receive 1 dose of Tdap. This initial dose should be followed by tetanus and diphtheria toxoids (Td) booster doses every 10 years. Adults with an unknown or incomplete history of completing a 3-dose immunization series with Td-containing vaccines should begin or complete a primary immunization series including a Tdap dose. Adults should receive a Td booster every 10 years. Varicella vaccine. An adult without evidence of immunity to varicella should receive 2 doses or a second dose if she has previously received 1 dose. Pregnant females who do not have evidence of immunity should receive the first dose after pregnancy. This first dose should be obtained before  leaving the health care facility. The second dose should be obtained 4-8 weeks after the first dose. Human papillomavirus (HPV) vaccine. Females aged 13-26 years who have not received the vaccine previously should obtain the 3-dose series. The vaccine is not recommended for use in pregnant females. However, pregnancy testing is not needed before receiving a dose. If a female is found to  be pregnant after receiving a dose, no treatment is needed. In that case, the remaining doses should be delayed until after the pregnancy. Immunization is recommended for any person with an immunocompromised condition through the age of 55 years if she did not get any or all doses earlier. During the 3-dose series, the second dose should be obtained 4-8 weeks after the first dose. The third dose should be obtained 24 weeks after the first dose and 16 weeks after the second dose. Zoster vaccine. One dose is recommended for adults aged 15 years or older unless certain conditions are present. Measles, mumps, and rubella (MMR) vaccine. Adults born before 20 generally are considered immune to measles and mumps. Adults born in [birth year] or later should have 1 or more doses of MMR vaccine unless there is a contraindication to the vaccine or there is laboratory evidence of immunity to each of the three diseases. A routine second dose of MMR vaccine should be obtained at least 28 days after the first dose for students attending postsecondary schools, health care workers, or international travelers. People who received inactivated measles vaccine or an unknown type of measles vaccine during 1963-1967 should receive 2 doses of MMR vaccine. People who received inactivated mumps vaccine or an unknown type of mumps vaccine before 1979 and are at high risk for mumps infection should consider immunization with 2 doses of MMR vaccine. For females of childbearing age, rubella immunity should be determined. If there is no evidence of immunity, females  who are not pregnant should be vaccinated. If there is no evidence of immunity, females who are pregnant should delay immunization until after pregnancy. Unvaccinated health care workers born before [birth year] who lack laboratory evidence of measles, mumps, or rubella immunity or laboratory confirmation of disease should consider measles and mumps immunization with 2 doses of MMR vaccine or rubella immunization with 1 dose of MMR vaccine. Pneumococcal 13-valent conjugate (PCV13) vaccine. When indicated, a person who is uncertain of her immunization history and has no record of immunization should receive the PCV13 vaccine. An adult aged 4 years or older who has certain medical conditions and has not been previously immunized should receive 1 dose of PCV13 vaccine. This PCV13 should be followed with a dose of pneumococcal polysaccharide (PPSV23) vaccine. The PPSV23 vaccine dose should be obtained at least 1 or more year(s) after the dose of PCV13 vaccine. An adult aged 15 years or older who has certain medical conditions and previously received 1 or more doses of PPSV23 vaccine should receive 1 dose of PCV13. The PCV13 vaccine dose should be obtained 1 or more years after the last PPSV23 vaccine dose.  Pneumococcal polysaccharide (PPSV23) vaccine. When PCV13 is also indicated, PCV13 should be obtained first. All adults aged 9 years and older should be immunized. An adult younger than age 14 years who has certain medical conditions should be immunized. Any person who resides in a nursing home or long-term care facility should be immunized. An adult smoker should be immunized. People with an immunocompromised condition and certain other conditions should receive both PCV13 and PPSV23 vaccines. People with human immunodeficiency virus (HIV) infection should be immunized as soon as possible after diagnosis. Immunization during chemotherapy or radiation therapy should be avoided. Routine use of PPSV23 vaccine is not  recommended for American Indians, [place] Natives, or people younger than 65 years unless there are medical conditions that require PPSV23 vaccine. When indicated, people who have unknown immunization and have no record of immunization should receive  PPSV23 vaccine. One-time revaccination 5 years after the first dose of PPSV23 is recommended for people aged 19-64 years who have chronic kidney failure, nephrotic syndrome, asplenia, or immunocompromised conditions. People who received 1-2 doses of PPSV23 before age 36 years should receive another dose of PPSV23 vaccine at age 52 years or later if at least 5 years have passed since the previous dose. Doses of PPSV23 are not needed for people immunized with PPSV23 at or after age 34 years.  Preventive Services / Frequency  Ages 74 to 1 years Blood pressure check. Lipid and cholesterol check. Lung cancer screening. / Every year if you are aged 53-80 years and have a 30-pack-year history of smoking and currently smoke or have quit within the past 15 years. Yearly screening is stopped once you have quit smoking for at least 15 years or develop a health problem that would prevent you from having lung cancer treatment. Clinical breast exam.** / Every year after age 16 years.  BRCA-related cancer risk assessment.** / For women who have family members with a BRCA-related cancer (breast, ovarian, tubal, or peritoneal cancers). Mammogram.** / Every year beginning at age 26 years and continuing for as long as you are in good health. Consult with your health care provider. Pap test.** / Every 3 years starting at age 19 years through age 52 or 29 years with a history of 3 consecutive normal Pap tests. HPV screening.** / Every 3 years from ages 18 years through ages 4 to 110 years with a history of 3 consecutive normal Pap tests. Fecal occult blood test (FOBT) of stool. / Every year beginning at age 70 years and continuing until age 53 years. You may not need to do this  test if you get a colonoscopy every 10 years. Flexible sigmoidoscopy or colonoscopy.** / Every 5 years for a flexible sigmoidoscopy or every 10 years for a colonoscopy beginning at age 66 years and continuing until age 44 years. Hepatitis C blood test.** / For all people born from [birth year] through 1965 and any individual with known risks for hepatitis C. Skin self-exam. / Monthly. Influenza vaccine. / Every year. Tetanus, diphtheria, and acellular pertussis (Tdap/Td) vaccine.** / Consult your health care provider. Pregnant women should receive 1 dose of Tdap vaccine during each pregnancy. 1 dose of Td every 10 years. Varicella vaccine.** / Consult your health care provider. Pregnant females who do not have evidence of immunity should receive the first dose after pregnancy. Zoster vaccine.** / 1 dose for adults aged 90 years or older. Pneumococcal 13-valent conjugate (PCV13) vaccine.** / Consult your health care provider. Pneumococcal polysaccharide (PPSV23) vaccine.** / 1 to 2 doses if you smoke cigarettes or if you have certain conditions. Meningococcal vaccine.** / Consult your health care provider. Hepatitis A vaccine.** / Consult your health care provider. Hepatitis B vaccine.** / Consult your health care provider. Screening for abdominal aortic aneurysm (AAA)  by ultrasound is recommended for people over 50 who have history of high blood pressure or who are current or former smokers. ++++++++++++++++++ Recommend Adult Low Dose Aspirin or  coated  Aspirin 81 mg daily  To reduce risk of Colon Cancer 40 %,  Skin Cancer 26 % ,  Melanoma 46%  and  Pancreatic cancer 60% +++++++++++++++++++ Vitamin D goal  is between 70-100.  Please make sure that you are taking your Vitamin D as directed.  It is very important as a natural anti-inflammatory  helping hair, skin, and nails, as well as reducing stroke and heart  attack risk.  It helps your bones and helps with mood. It also decreases numerous  cancer risks so please take it as directed.  Low Vit D is associated with a 200-300% higher risk for CANCER  and 200-300% higher risk for HEART   ATTACK  &  STROKE.   .....................................Marland Kitchen It is also associated with higher death rate at younger ages,  autoimmune diseases like Rheumatoid arthritis, Lupus, Multiple Sclerosis.    Also many other serious conditions, like depression, Alzheimer's Dementia, infertility, muscle aches, fatigue, fibromyalgia - just to name a few. ++++++++++++++++++ Recommend the book "The END of DIETING" by Dr Excell Seltzer  & the book "The END of DIABETES " by Dr Excell Seltzer At [email or web address] - get book & Audio CD's    Being diabetic has a  300% increased risk for heart attack, stroke, cancer, and alzheimer- type vascular dementia. It is very important that you work harder with diet by avoiding all foods that are white. Avoid white rice (brown & wild rice is OK), white potatoes (sweetpotatoes in moderation is OK), White bread or wheat bread or anything made out of white flour like bagels, donuts, rolls, buns, biscuits, cakes, pastries, cookies, pizza crust, and pasta (made from white flour & egg whites) - vegetarian pasta or spinach or wheat pasta is OK. Multigrain breads like Arnold's or [employer], or multigrain sandwich thins or flatbreads.  Diet, exercise and weight loss can reverse and cure diabetes in the early stages.  Diet, exercise and weight loss is very important in the control and prevention of complications of diabetes which affects every system in your body, ie. Brain - dementia/stroke, eyes - glaucoma/blindness, heart - heart attack/heart failure, kidneys - dialysis, stomach - gastric paralysis, intestines - malabsorption, nerves - severe painful neuritis, circulation - gangrene & loss of a leg(s), and finally cancer and Alzheimers.    I recommend avoid fried & greasy foods,  sweets/candy, white rice (brown or wild rice or Quinoa is OK), white  potatoes (sweet potatoes are OK) - anything made from white flour - bagels, doughnuts, rolls, buns, biscuits,white and wheat breads, pizza crust and traditional pasta made of white flour & egg white(vegetarian pasta or spinach or wheat pasta is OK).  Multi-grain bread is OK - like multi-grain flat bread or sandwich thins. Avoid alcohol in excess. Exercise is also important.    Eat all the vegetables you want - avoid meat, especially red meat and dairy - especially cheese.  Cheese is the most concentrated form of trans-fats which is the worst thing to clog up our arteries. Veggie cheese is OK which can be found in the fresh produce section at [employer] or [employer] or [employer]  ++++++++++++++++++++++ DASH Eating Plan  DASH stands for "Dietary Approaches to Stop Hypertension."   The DASH eating plan is a healthy eating plan that has been shown to reduce high blood pressure (hypertension). Additional health benefits may include reducing the risk of type 2 diabetes mellitus, heart disease, and stroke. The DASH eating plan may also help with weight loss. WHAT DO I NEED TO KNOW ABOUT THE DASH EATING PLAN? For the DASH eating plan, you will follow these general guidelines: Choose foods with a percent daily value for sodium of less than 5% (as listed on the food label). Use salt-free seasonings or herbs instead of table salt or sea salt. Check with your health care provider or pharmacist before using salt substitutes. Eat lower-sodium products, often labeled as "lower sodium" or "no  salt added." Eat fresh foods. Eat more vegetables, fruits, and low-fat dairy products. Choose whole grains. Look for the word "whole" as the first word in the ingredient list. Choose fish  Limit sweets, desserts, sugars, and sugary drinks. Choose heart-healthy fats. Eat veggie cheese  Eat more home-cooked food and less restaurant, buffet, and fast food. Limit fried foods. Cook foods using methods other than  frying. Limit canned vegetables. If you do use them, rinse them well to decrease the sodium. When eating at a restaurant, ask that your food be prepared with less salt, or no salt if possible.                      WHAT FOODS CAN I EAT? Read Dr Fara Olden Fuhrman's books on The End of Dieting & The End of Diabetes  Grains Whole grain or whole wheat bread. Brown rice. Whole grain or whole wheat pasta. Quinoa, bulgur, and whole grain cereals. Low-sodium cereals. Corn or whole wheat flour tortillas. Whole grain cornbread. Whole grain crackers. Low-sodium crackers.  Vegetables Fresh or frozen vegetables (raw, steamed, roasted, or grilled). Low-sodium or reduced-sodium tomato and vegetable juices. Low-sodium or reduced-sodium tomato sauce and paste. Low-sodium or reduced-sodium canned vegetables.   Fruits All fresh, canned (in natural juice), or frozen fruits.  Protein Products  All fish and seafood.  Dried beans, peas, or lentils. Unsalted nuts and seeds. Unsalted canned beans.  Dairy Low-fat dairy products, such as skim or 1% milk, 2% or reduced-fat cheeses, low-fat ricotta or cottage cheese, or plain low-fat yogurt. Low-sodium or reduced-sodium cheeses.  Fats and Oils Tub margarines without trans fats. Light or reduced-fat mayonnaise and salad dressings (reduced sodium). Avocado. Safflower, olive, or canola oils. Natural peanut or almond butter.  Other Unsalted popcorn and pretzels. The items listed above may not be a complete list of recommended foods or beverages. Contact your dietitian for more options.  ++++++++++++++++++  WHAT FOODS ARE NOT RECOMMENDED? Grains/ White flour or wheat flour White bread. White pasta. White rice. Refined cornbread. Bagels and croissants. Crackers that contain trans fat.  Vegetables  Creamed or fried vegetables. Vegetables in a . Regular canned vegetables. Regular canned tomato sauce and paste. Regular tomato and vegetable juices.  Fruits Dried fruits.  Canned fruit in light or heavy syrup. Fruit juice.  Meat and Other Protein Products Meat in general - RED meat & White meat.  Fatty cuts of meat. Ribs, chicken wings, all processed meats as bacon, sausage, bologna, salami, fatback, hot dogs, bratwurst and packaged luncheon meats.  Dairy Whole or 2% milk, cream, half-and-half, and cream cheese. Whole-fat or sweetened yogurt. Full-fat cheeses or blue cheese. Non-dairy creamers and whipped toppings. Processed cheese, cheese spreads, or cheese curds.  Condiments Onion and garlic salt, seasoned salt, table salt, and sea salt. Canned and packaged gravies. Worcestershire sauce. Tartar sauce. Barbecue sauce. Teriyaki sauce. Soy sauce, including reduced sodium. Steak sauce. Fish sauce. Oyster sauce. Cocktail sauce. Horseradish. Ketchup and mustard. Meat flavorings and tenderizers. Bouillon cubes. Hot sauce. Tabasco sauce. Marinades. Taco seasonings. Relishes.  Fats and Oils Butter, stick margarine, lard, shortening and bacon fat. Coconut, palm kernel, or palm oils. Regular salad dressings.  Pickles and olives. Salted popcorn and pretzels.  The items listed above may not be a complete list of foods and beverages to avoid.

## 2023-07-15 LAB — URINALYSIS, ROUTINE W REFLEX MICROSCOPIC
Bacteria, UA: NONE SEEN /[HPF]
Bilirubin Urine: NEGATIVE
Glucose, UA: NEGATIVE
Hyaline Cast: NONE SEEN /[LPF]
Ketones, ur: NEGATIVE
Leukocytes,Ua: NEGATIVE
Nitrite: NEGATIVE
Specific Gravity, Urine: 1.023 (ref 1.001–1.035)
Squamous Epithelial / HPF: NONE SEEN /[HPF] (ref <=5)
WBC, UA: NONE SEEN /[HPF] (ref 0–5)
pH: 6.5 (ref 5.0–8.0)

## 2023-07-15 LAB — COMPLETE METABOLIC PANEL WITH GFR
AG Ratio: 1.6 (calc) (ref 1.0–2.5)
ALT: 16 U/L (ref 6–29)
AST: 18 U/L (ref 10–35)
Albumin: 4.4 g/dL (ref 3.6–5.1)
Alkaline phosphatase (APISO): 79 U/L (ref 37–153)
BUN: 17 mg/dL (ref 7–25)
CO2: 30 mmol/L (ref 20–32)
Calcium: 10.1 mg/dL (ref 8.6–10.4)
Chloride: 104 mmol/L (ref 98–110)
Creat: 0.65 mg/dL (ref 0.50–1.05)
Globulin: 2.7 g/dL (ref 1.9–3.7)
Glucose, Bld: 90 mg/dL (ref 65–99)
Potassium: 4.7 mmol/L (ref 3.5–5.3)
Sodium: 141 mmol/L (ref 135–146)
Total Bilirubin: 0.3 mg/dL (ref 0.2–1.2)
Total Protein: 7.1 g/dL (ref 6.1–8.1)
eGFR: 99 mL/min/{1.73_m2} (ref >=60)

## 2023-07-15 LAB — CBC WITH DIFFERENTIAL/PLATELET
Absolute Lymphocytes: 3056 {cells}/uL (ref 850–3900)
Absolute Monocytes: 460 {cells}/uL (ref 200–950)
Basophils Absolute: 19 {cells}/uL (ref 0–200)
Basophils Relative: 0.3 %
Eosinophils Absolute: 32 {cells}/uL (ref 15–500)
Eosinophils Relative: 0.5 %
HCT: 41.5 % (ref 35.0–45.0)
Hemoglobin: 13.5 g/dL (ref 11.7–15.5)
MCH: 28.7 pg (ref 27.0–33.0)
MCHC: 32.5 g/dL (ref 32.0–36.0)
MCV: 88.3 fL (ref 80.0–100.0)
MPV: 10.6 fL (ref 7.5–12.5)
Monocytes Relative: 7.3 %
Neutro Abs: 2734 {cells}/uL (ref 1500–7800)
Neutrophils Relative %: 43.4 %
Platelets: 358 10*3/uL (ref 140–400)
RBC: 4.7 10*6/uL (ref 3.80–5.10)
RDW: 13.3 % (ref 11.0–15.0)
Total Lymphocyte: 48.5 %
WBC: 6.3 10*3/uL (ref 3.8–10.8)

## 2023-07-15 LAB — HEMOGLOBIN A1C
Hgb A1c MFr Bld: 5.6 %{Hb} (ref <5.7)
Mean Plasma Glucose: 114 mg/dL
eAG (mmol/L): 6.3 mmol/L

## 2023-07-15 LAB — LIPID PANEL
Cholesterol: 194 mg/dL (ref <200)
HDL: 93 mg/dL (ref >=50)
LDL Cholesterol (Calc): 82 mg/dL
Non-HDL Cholesterol (Calc): 101 mg/dL (ref <130)
Total CHOL/HDL Ratio: 2.1 (calc) (ref <5.0)
Triglycerides: 94 mg/dL (ref <150)

## 2023-07-15 LAB — VITAMIN D 25 HYDROXY (VIT D DEFICIENCY, FRACTURES): Vit D, 25-Hydroxy: 46 ng/mL (ref 30–100)

## 2023-07-15 LAB — MICROALBUMIN / CREATININE URINE RATIO
Creatinine, Urine: 129 mg/dL (ref 20–275)
Microalb Creat Ratio: 41 mg/g{creat} — ABNORMAL HIGH (ref <30)
Microalb, Ur: 5.3 mg/dL

## 2023-07-15 LAB — TSH: TSH: 0.57 m[IU]/L (ref 0.40–4.50)

## 2023-07-15 LAB — INSULIN, RANDOM: Insulin: 12.7 u[IU]/mL

## 2023-07-15 LAB — MICROSCOPIC MESSAGE

## 2023-07-15 LAB — MAGNESIUM: Magnesium: 2.1 mg/dL (ref 1.5–2.5)

## 2023-07-15 NOTE — Progress Notes (Signed)
<>*<>*<>*<>*<>*<>*<>*<>*<>*<>*<>*<>*<>*<>*<>*<>*<>*<>*<>*<>*<>*<>*<>*<>*<> <>*<>*<>*<>*<>*<>*<>*<>*<>*<>*<>*<>*<>*<>*<>*<>*<>*<>*<>*<>*<>*<>*<>*<>*<>  -Test results slightly outside the reference range are not unusual. If there is anything important, I will review this with you,  otherwise it is considered normal test values.  If you have further questions,  please do not hesitate to contact me at the office or via My Chart.   <>*<>*<>*<>*<>*<>*<>*<>*<>*<>*<>*<>*<>*<>*<>*<>*<>*<>*<>*<>*<>*<>*<>*<>*<> <>*<>*<>*<>*<>*<>*<>*<>*<>*<>*<>*<>*<>*<>*<>*<>*<>*<>*<>*<>*<>*<>*<>*<>*<>  -  Chol = 194   &   LDL  82 -   Both Great    - Very low risk for Heart Attack  / Stroke  <>*<>*<>*<>*<>*<>*<>*<>*<>*<>*<>*<>*<>*<>*<>*<>*<>*<>*<>*<>*<>*<>*<>*<>*<> <>*<>*<>*<>*<>*<>*<>*<>*<>*<>*<>*<>*<>*<>*<>*<>*<>*<>*<>*<>*<>*<>*<>*<>*<>  -  Thyroid - Perfect now - Please keep dose same   <>*<>*<>*<>*<>*<>*<>*<>*<>*<>*<>*<>*<>*<>*<>*<>*<>*<>*<>*<>*<>*<>*<>*<>*<> <>*<>*<>*<>*<>*<>*<>*<>*<>*<>*<>*<>*<>*<>*<>*<>*<>*<>*<>*<>*<>*<>*<>*<>*<>  -  A1c =  5.6    -  back in Normal non preDiabetic range - Great  !  <>*<>*<>*<>*<>*<>*<>*<>*<>*<>*<>*<>*<>*<>*<>*<>*<>*<>*<>*<>*<>*<>*<>*<>*<> <>*<>*<>*<>*<>*<>*<>*<>*<>*<>*<>*<>*<>*<>*<>*<>*<>*<>*<>*<>*<>*<>*<>*<>*<>  -  Vitamin D = 46 - is very LOW   - Vitamin D goal is between 70-100.   - Please make sure that you are taking your   Vitamin D 10,000 units /day as directed.  \  - It is very important as a natural anti-inflammatory and helping the                             immune system protect against                                                   viral infections, like FLU or Covid-19    - helps hair, skin, and nails, as well as reducing stroke and heart attack risk.   - It helps your bones and helps with mood.  - It also decreases numerous cancer risks so please                                                                                            take it as directed.   - Low Vit D is associated with a 200-300% higher risk for CANCER   and 200-300% higher risk for HEART   ATTACK  &  STROKE.    - It is also associated with higher death rate at younger ages,   autoimmune diseases like Rheumatoid arthritis, Lupus, Multiple Sclerosis.     - Also many other serious conditions, like                                         Depression,                                              Alzheimer's Dementia,  muscle aches,                                                                                   fatigue,                                                                                     fibromyalgia   <>*<>*<>*<>*<>*<>*<>*<>*<>*<>*<>*<>*<>*<>*<>*<>*<>*<>*<>*<>*<>*<>*<>*<>*<> <>*<>*<>*<>*<>*<>*<>*<>*<>*<>*<>*<>*<>*<>*<>*<>*<>*<>*<>*<>*<>*<>*<>*<>*<>  -  All Else - CBC - Kidneys - Electrolytes - Liver - Magnesium & Thyroid    - all  Normal / OK  <>*<>*<>*<>*<>*<>*<>*<>*<>*<>*<>*<>*<>*<>*<>*<>*<>*<>*<>*<>*<>*<>*<>*<>*<> <>*<>*<>*<>*<>*<>*<>*<>*<>*<>*<>*<>*<>*<>*<>*<>*<>*<>*<>*<>*<>*<>*<>*<>*<>

## 2023-07-28 DIAGNOSIS — N3001 Acute cystitis with hematuria: Secondary | ICD-10-CM | POA: Diagnosis not present

## 2023-07-29 ENCOUNTER — Emergency Department (HOSPITAL_BASED_OUTPATIENT_CLINIC_OR_DEPARTMENT_OTHER)
Admission: EM | Admit: 2023-07-29 | Discharge: 2023-07-29 | Disposition: A | Discharge status: Home / Self Care | Payer: Medicare Other | Attending: Emergency Medicine | Admitting: Emergency Medicine

## 2023-07-29 ENCOUNTER — Encounter (HOSPITAL_BASED_OUTPATIENT_CLINIC_OR_DEPARTMENT_OTHER): Payer: Self-pay

## 2023-07-29 ENCOUNTER — Emergency Department (HOSPITAL_BASED_OUTPATIENT_CLINIC_OR_DEPARTMENT_OTHER): Payer: Medicare Other

## 2023-07-29 ENCOUNTER — Other Ambulatory Visit (HOSPITAL_BASED_OUTPATIENT_CLINIC_OR_DEPARTMENT_OTHER): Payer: Self-pay

## 2023-07-29 ENCOUNTER — Other Ambulatory Visit: Payer: Self-pay

## 2023-07-29 DIAGNOSIS — K5792 Diverticulitis of intestine, part unspecified, without perforation or abscess without bleeding: Secondary | ICD-10-CM | POA: Insufficient documentation

## 2023-07-29 DIAGNOSIS — Z7982 Long term (current) use of aspirin: Secondary | ICD-10-CM | POA: Diagnosis not present

## 2023-07-29 DIAGNOSIS — N390 Urinary tract infection, site not specified: Secondary | ICD-10-CM | POA: Diagnosis not present

## 2023-07-29 DIAGNOSIS — R109 Unspecified abdominal pain: Secondary | ICD-10-CM | POA: Diagnosis not present

## 2023-07-29 DIAGNOSIS — D696 Thrombocytopenia, unspecified: Secondary | ICD-10-CM | POA: Diagnosis not present

## 2023-07-29 DIAGNOSIS — K5732 Diverticulitis of large intestine without perforation or abscess without bleeding: Secondary | ICD-10-CM | POA: Diagnosis not present

## 2023-07-29 LAB — COMPREHENSIVE METABOLIC PANEL
ALT: 13 U/L (ref 0–44)
AST: 16 U/L (ref 15–41)
Albumin: 4.3 g/dL (ref 3.5–5.0)
Alkaline Phosphatase: 65 U/L (ref 38–126)
Anion gap: 9 (ref 5–15)
BUN: 12 mg/dL (ref 8–23)
CO2: 26 mmol/L (ref 22–32)
Calcium: 10.3 mg/dL (ref 8.9–10.3)
Chloride: 104 mmol/L (ref 98–111)
Creatinine, Ser: 0.55 mg/dL (ref 0.44–1.00)
GFR, Estimated: >60 mL/min (ref >60)
Glucose, Bld: 97 mg/dL (ref 70–99)
Potassium: 4 mmol/L (ref 3.5–5.1)
Sodium: 139 mmol/L (ref 135–145)
Total Bilirubin: 0.5 mg/dL (ref <1.2)
Total Protein: 7.5 g/dL (ref 6.5–8.1)

## 2023-07-29 LAB — URINALYSIS, ROUTINE W REFLEX MICROSCOPIC
Glucose, UA: NEGATIVE mg/dL
Ketones, ur: 15 mg/dL — AB
Leukocytes,Ua: NEGATIVE
Nitrite: POSITIVE — AB
Specific Gravity, Urine: 1.021 (ref 1.005–1.030)
pH: 5.5 (ref 5.0–8.0)

## 2023-07-29 LAB — CBC
HCT: 41.8 % (ref 36.0–46.0)
Hemoglobin: 13.6 g/dL (ref 12.0–15.0)
MCH: 28.8 pg (ref 26.0–34.0)
MCHC: 32.5 g/dL (ref 30.0–36.0)
MCV: 88.4 fL (ref 80.0–100.0)
Platelets: 121 10*3/uL — ABNORMAL LOW (ref 150–400)
RBC: 4.73 MIL/uL (ref 3.87–5.11)
RDW: 13.9 % (ref 11.5–15.5)
WBC: 10.1 10*3/uL (ref 4.0–10.5)
nRBC: 0 % (ref 0.0–0.2)

## 2023-07-29 LAB — LIPASE, BLOOD: Lipase: 13 U/L (ref 11–51)

## 2023-07-29 MED ORDER — AMOXICILLIN-POT CLAVULANATE 875-125 MG PO TABS
1.0000 | ORAL_TABLET | Freq: Once | ORAL | Status: AC
  Administered 2023-07-29: 1 via ORAL
  Filled 2023-07-29: qty 1

## 2023-07-29 MED ORDER — DIAZEPAM 5 MG/ML IJ SOLN
2.5000 mg | Freq: Once | INTRAMUSCULAR | Status: AC
  Administered 2023-07-29: 2.5 mg via INTRAVENOUS
  Filled 2023-07-29: qty 2

## 2023-07-29 MED ORDER — OXYCODONE HCL 5 MG PO TABS: 2.5000 mg | ORAL_TABLET | ORAL | 0 refills | Status: DC | PRN

## 2023-07-29 MED ORDER — HYOSCYAMINE SULFATE 0.125 MG SL SUBL
0.1250 mg | SUBLINGUAL_TABLET | Freq: Once | SUBLINGUAL | Status: AC
  Administered 2023-07-29: 0.125 mg via SUBLINGUAL
  Filled 2023-07-29: qty 1

## 2023-07-29 MED ORDER — METOCLOPRAMIDE HCL 10 MG PO TABS: 10.0000 mg | ORAL_TABLET | Freq: Four times a day (QID) | ORAL | 0 refills | Status: AC | PRN

## 2023-07-29 MED ORDER — AMOXICILLIN-POT CLAVULANATE 875-125 MG PO TABS: 1.0000 | ORAL_TABLET | Freq: Two times a day (BID) | ORAL | 0 refills | Status: DC

## 2023-07-29 MED ORDER — HYOSCYAMINE SULFATE 0.125 MG SL SUBL: 0.1250 mg | SUBLINGUAL_TABLET | Freq: Once | SUBLINGUAL | Status: DC

## 2023-07-29 MED ORDER — METOCLOPRAMIDE HCL 10 MG PO TABS: 10.0000 mg | ORAL_TABLET | Freq: Four times a day (QID) | ORAL | 0 refills | Status: DC | PRN

## 2023-07-29 MED ORDER — IOHEXOL 300 MG/ML  SOLN
100.0000 mL | Freq: Once | INTRAMUSCULAR | Status: AC | PRN
  Administered 2023-07-29: 100 mL via INTRAVENOUS

## 2023-07-29 MED ORDER — HYOSCYAMINE SULFATE 0.125 MG SL SUBL: 0.1250 mg | SUBLINGUAL_TABLET | SUBLINGUAL | 0 refills | Status: AC | PRN

## 2023-07-29 MED ORDER — FENTANYL CITRATE PF 50 MCG/ML IJ SOSY
50.0000 ug | PREFILLED_SYRINGE | Freq: Once | INTRAMUSCULAR | Status: AC
  Administered 2023-07-29: 50 ug via INTRAVENOUS
  Filled 2023-07-29: qty 1

## 2023-07-29 NOTE — Discharge Instructions (Signed)
Maintain a clear liquid diet for the next days.  You may progress your diet slowly after that.Contact a health care provider if: Your pain gets worse. Your pooping does not go back to normal. Your symptoms do not get better with treatment. Your symptoms get worse all of a sudden. You have a fever. You vomit more than one time. Your poop is bloody, black, or tarry.Contact a health care provider if: Your pain gets worse. Your pooping does not go back to normal. Your symptoms do not get better with treatment. Your symptoms get worse all of a sudden. You have a fever. You vomit more than one time. Your poop is bloody, black, or tarry.

## 2023-07-29 NOTE — ED Triage Notes (Signed)
Patient arrives with complaints of worsening lower abdominal. Patient report she is being treated for a UTI (antibiotics and Azo) with no relief. Also reports spasms in lower abdomen as well.  Rates pain a 4/10.

## 2023-07-29 NOTE — ED Notes (Signed)
Waiting for her ride

## 2023-07-29 NOTE — Progress Notes (Unsigned)
Assessment and Plan:  There are no diagnoses linked to this encounter.    Further disposition pending results of labs. Discussed med's effects and SE's.   Over 30 minutes of exam, counseling, chart review, and critical decision making was performed.   Future Appointments  Date Time Provider Department Center  08/01/2023  1:45 PM Raynelle Dick, NP [facility] None  10/20/2023  3:30 PM Raynelle Dick, NP [facility] None  01/26/2024  3:30 PM Lucky Cowboy, MD [facility] None  04/30/2024  3:30 PM Raynelle Dick, NP [facility] None  07/31/2024  2:00 PM Lucky Cowboy, MD [facility] None    ------------------------------------------------------------------------------------------------------------------   HPI LMP  (LMP Unknown)  62 y.o.female presents for  Past Medical History:  Diagnosis Date   Arthritis    OA, RA   Asthma    with a cold   Autoimmune deficiency syndrome (HCC)    RA   Back pain    Cancer (HCC)    thyroid-1995   Family history of adverse reaction to anesthesia    mother problems with n/v   Fibromyalgia    GERD (gastroesophageal reflux disease)    Hepatitis    'A" when pt. was 12 yrs. old   Hiatal hernia    Hyperlipidemia    Hypertension    Hypothyroidism    Pneumonia    S/P total knee arthroplasty 02/16/2016     Allergies  Allergen Reactions   Ciprofloxacin Hives and Swelling    SWELLING REACTION UNSPECIFIED    Levofloxacin Hives and Other (See Comments)   Lyrica [Pregabalin] Other (See Comments)    Altered mental status ALTERED MENTAL STATUS HALLUCINATIONS   Compazine [Prochlorperazine Edisylate]     UNSPECIFIED REACTION    Gabapentin Other (See Comments)    Diarrhea and nausea   Sulfamethoxazole-Trimethoprim Other (See Comments)    itch   Xeljanz [Tofacitinib] Hives   Zetia [Ezetimibe]     Stomach cramping   Hydrocodone Itching   Morphine And Codeine Itching   Penicillins Itching   Sulfa Antibiotics Itching    Current  Outpatient Medications on File Prior to Visit  Medication Sig   acetaminophen (TYLENOL) 500 MG tablet Take 1 tablet (500 mg total) by mouth every 6 (six) hours as needed.   Acetylcysteine (N-ACETYL-L-CYSTEINE PO) Take 1 capsule by mouth daily.   albuterol (PROVENTIL) (2.5 MG/3ML) 0.083% nebulizer solution Take 3 mLs (2.5 mg total) by nebulization every 6 (six) hours as needed for wheezing or shortness of breath.   albuterol (VENTOLIN HFA) 108 (90 Base) MCG/ACT inhaler INHALE 2 PUFFS INTO THE LUNGS EVERY 4 HOURS 15 MINUTES APART AS NEEDED FOR ASTHMA   amitriptyline (ELAVIL) 25 MG tablet TAKE 1 TABLET BY MOUTH EVERY MORNING AND 3 TABLETS BY MOUTH AT BEDTIME FOR CHRONIC PAIN   ASPIRIN 81 PO Take by mouth daily.   Black Pepper-Turmeric (TURMERIC CURCUMIN) 01-999 MG CAPS Takes 1 capsule  Daily   Cholecalciferol (VITAMIN D3) 5000 UNITS TABS Take 10,000 Units by mouth daily.    CRANBERRY PO Take 1 tablet by mouth 2 (two) times daily.   diclofenac Sodium (VOLTAREN) 1 % GEL Apply 2 g topically as needed (inflamation).   Eyelid Cleansers (AVENOVA) 0.01 % SOLN Place 1 application into both eyes 2 (two) times daily. Apply to eyelids BID   fexofenadine (ALLEGRA) 180 MG tablet Take 180 mg by mouth daily as needed.   fluticasone furoate-vilanterol (BREO ELLIPTA) 100-25 MCG/ACT AEPB INHALE 1 PUFF BY MOUTH DAILY.   furosemide (LASIX) 40 MG  tablet TAKE 1 TABLET BY MOUTH ONCE A WEEK   Golimumab (SIMPONI ARIA IV) Inject 2 mg/kg into the vein every 8 (eight) weeks.   levothyroxine (SYNTHROID) 125 MCG tablet Take  1 tablet  Daily  on an empty stomach with only water for 30 minutes & no Antacid meds, Calcium or Magnesium for 4 hours & avoid Biotin                                /                                                                   TAKE                                         BY                                                 MOUTH   LOTEMAX 0.5 % GEL INSTILL 1 DROP IN BOTH EYES THREE TIMES DAILY    Magnesium Oxide 500 MG TABS Take 1,000 mg by mouth daily.   Methocarbamol 1000 MG TABS Take 1,000 mg by mouth as needed.   MILK THISTLE EXTRACT PO Take 2 capsules by mouth daily.   montelukast (SINGULAIR) 10 MG tablet TAKE 1 TABLET BY MOUTH DAILY FOR ALLERGIES   mupirocin ointment (BACTROBAN) 2 % APPLY EXTERNALLY TO THE AFFECTED AREA THREE TIMES DAILY FOR 10 DAYS   olmesartan (BENICAR) 20 MG tablet TAKE 1 TABLET BY MOUTH DAILY FOR BLOOD PRESSURE   Omega-3 Fatty Acids (FISH OIL) 1200 MG CAPS Take 2 capsules by mouth daily.    REPATHA SURECLICK 140 MG/ML SOAJ ADMINISTER 1 ML UNDER THE SKIN EVERY 14 DAYS   RESTASIS 0.05 % ophthalmic emulsion Place 1 drop into both eyes daily.    rosuvastatin (CRESTOR) 40 MG tablet TAKE 1 TABLET BY MOUTH DAILY FOR CHOLESTEROL   terbinafine (LAMISIL) 250 MG tablet Take 1 tablet Daily for Toenail Fungus   tirzepatide (ZEPBOUND) 2.5 MG/0.5ML injection vial Inject 2.5 mg into the skin once a week.   triamcinolone (NASACORT) 55 MCG/ACT AERO nasal inhaler Place 2 sprays into the nose 2 (two) times daily.   vitamin B-12 (CYANOCOBALAMIN) 1000 MCG tablet Take 1,000 mcg by mouth daily.   No current facility-administered medications on file prior to visit.    ROS: all negative except above.   Physical Exam:  LMP  (LMP Unknown)   General Appearance: Well nourished, in no apparent distress. Eyes: PERRLA, EOMs, conjunctiva no swelling or erythema Sinuses: No Frontal/maxillary tenderness ENT/Mouth: Ext aud canals clear, TMs without erythema, bulging. No erythema, swelling, or exudate on post pharynx.  Tonsils not swollen or erythematous. Hearing normal.  Neck: Supple, thyroid normal.  Respiratory: Respiratory effort normal, BS equal bilaterally without rales, rhonchi, wheezing or stridor.  Cardio: RRR with no MRGs. Brisk peripheral pulses without edema.  Abdomen: Soft, + BS.  Non tender, no guarding, rebound, hernias, masses.  Lymphatics: Non tender without  lymphadenopathy.  Musculoskeletal: Full ROM, 5/5 strength, normal gait.  Skin: Warm, dry without rashes, lesions, ecchymosis.  Neuro: Cranial nerves intact. Normal muscle tone, no cerebellar symptoms. Sensation intact.  Psych: Awake and oriented X 3, normal affect, Insight and Judgment appropriate.     Raynelle Dick, NP 11:03 AM [facility]

## 2023-07-29 NOTE — ED Provider Notes (Signed)
[place] EMERGENCY DEPARTMENT AT [facility] Provider Note   CSN: [id number] Arrival date & time: 07/29/23  1350     History  Chief Complaint  Patient presents with   Urinary Tract Infection   Abdominal Pain    Autumn Frye is a 62 y.o. female who presents to the emergency department with a chief complaint of lower abdominal pain.  She has a past medical history of rheumatoid arthritis and is on disease modifying agents.  Patient reports lower abdominal pain, distention, urinary frequency for the past few days.  She has a history of urinary tract infections but states that it always starts as hemorrhagic cystitis.  She has had no bleeding at this time but did have prodromal symptoms of generalized malaise and fatigue.  She was seen at an urgent care with [facility] yesterday, diagnosed with urinary tract infection and started on Macrobid and Pyridium.  Patient reports that she is having severe episodes of "spasms that were unclear to my back."  Patient reports that these come and go and cause severe intractable pain when it is occurring and then go away.  Patient has an episode during my evaluation and becomes extremely tearful.  Patient also notably has a spinal stimulator in place.  She denies fevers, chills or hematuria.   Urinary Tract Infection Associated symptoms: abdominal pain   Abdominal Pain      Home Medications Prior to Admission medications   Medication Sig Start Date End Date Taking? Authorizing Provider  acetaminophen (TYLENOL) 500 MG tablet Take 1 tablet (500 mg total) by mouth every 6 (six) hours as needed. 06/17/17   Antony Madura, PA-C  Acetylcysteine (N-ACETYL-L-CYSTEINE PO) Take 1 capsule by mouth daily.    [provider]  albuterol (PROVENTIL) (2.5 MG/3ML) 0.083% nebulizer solution Take 3 mLs (2.5 mg total) by nebulization every 6 (six) hours as needed for wheezing or shortness of breath. 12/18/18   Judd Gaudier, NP  albuterol  (VENTOLIN HFA) 108 (90 Base) MCG/ACT inhaler INHALE 2 PUFFS INTO THE LUNGS EVERY 4 HOURS 15 MINUTES APART AS NEEDED FOR ASTHMA 11/26/20   Judd Gaudier, NP  amitriptyline (ELAVIL) 25 MG tablet TAKE 1 TABLET BY MOUTH EVERY MORNING AND 3 TABLETS BY MOUTH AT BEDTIME FOR CHRONIC PAIN 10/05/22   Lucky Cowboy, MD  ASPIRIN 81 PO Take by mouth daily.    [provider]  Black Pepper-Turmeric (TURMERIC CURCUMIN) 01-999 MG CAPS Takes 1 capsule  Daily 06/08/21   Lucky Cowboy, MD  Cholecalciferol (VITAMIN D3) 5000 UNITS TABS Take 10,000 Units by mouth daily.     [provider]  CRANBERRY PO Take 1 tablet by mouth 2 (two) times daily.    [provider]  diclofenac Sodium (VOLTAREN) 1 % GEL Apply 2 g topically as needed (inflamation).    [provider]  Eyelid Cleansers (AVENOVA) 0.01 % SOLN Place 1 application into both eyes 2 (two) times daily. Apply to eyelids BID 12/18/18   Judd Gaudier, NP  fexofenadine (ALLEGRA) 180 MG tablet Take 180 mg by mouth daily as needed.    [provider]  fluticasone furoate-vilanterol (BREO ELLIPTA) 100-25 MCG/ACT AEPB INHALE 1 PUFF BY MOUTH DAILY. 08/06/22   Raynelle Dick, NP  furosemide (LASIX) 40 MG tablet TAKE 1 TABLET BY MOUTH ONCE A WEEK 03/22/23   Raynelle Dick, NP  Golimumab (SIMPONI ARIA IV) Inject 2 mg/kg into the vein every 8 (eight) weeks.    [provider]  levothyroxine (SYNTHROID) 125  MCG tablet Take  1 tablet  Daily  on an empty stomach with only water for 30 minutes & no Antacid meds, Calcium or Magnesium for 4 hours & avoid Biotin                                /                                                                   TAKE                                         BY                                                 MOUTH 06/19/23   Lucky Cowboy, MD  LOTEMAX 0.5 % GEL INSTILL 1 DROP IN BOTH EYES THREE TIMES DAILY 08/08/20   Lucky Cowboy, MD  Magnesium Oxide 500 MG TABS Take 1,000  mg by mouth daily.    [provider]  Methocarbamol 1000 MG TABS Take 1,000 mg by mouth as needed. 09/08/22   Raynelle Dick, NP  MILK THISTLE EXTRACT PO Take 2 capsules by mouth daily.    [provider]  montelukast (SINGULAIR) 10 MG tablet TAKE 1 TABLET BY MOUTH DAILY FOR ALLERGIES 08/28/22   Adela Glimpse, NP  mupirocin ointment (BACTROBAN) 2 % APPLY EXTERNALLY TO THE AFFECTED AREA THREE TIMES DAILY FOR 10 DAYS 11/20/20   Lucky Cowboy, MD  olmesartan (BENICAR) 20 MG tablet TAKE 1 TABLET BY MOUTH DAILY FOR BLOOD PRESSURE 04/15/23   Raynelle Dick, NP  Omega-3 Fatty Acids (FISH OIL) 1200 MG CAPS Take 2 capsules by mouth daily.     [provider]  REPATHA SURECLICK 140 MG/ML SOAJ ADMINISTER 1 ML UNDER THE SKIN EVERY 14 DAYS 12/09/22   Nahser, Deloris Ping, MD  RESTASIS 0.05 % ophthalmic emulsion Place 1 drop into both eyes daily.  08/07/14   [provider]  rosuvastatin (CRESTOR) 40 MG tablet TAKE 1 TABLET BY MOUTH DAILY FOR CHOLESTEROL 04/15/23   Raynelle Dick, NP  terbinafine (LAMISIL) 250 MG tablet Take 1 tablet Daily for Toenail Fungus 07/14/23   Lucky Cowboy, MD  tirzepatide [facility]) 2.5 MG/0.5ML injection vial Inject 2.5 mg into the skin once a week. 07/14/23   Lucky Cowboy, MD  triamcinolone (NASACORT) 55 MCG/ACT AERO nasal inhaler Place 2 sprays into the nose 2 (two) times daily. 09/08/22 09/08/23  Raynelle Dick, NP  vitamin B-12 (CYANOCOBALAMIN) 1000 MCG tablet Take 1,000 mcg by mouth daily.    [provider]      Allergies    Ciprofloxacin, Levofloxacin, Lyrica [pregabalin], Compazine [prochlorperazine edisylate], Gabapentin, Sulfamethoxazole-trimethoprim, Xeljanz [tofacitinib], Zetia [ezetimibe], Hydrocodone, Morphine and codeine, Penicillins, and Sulfa antibiotics    Review of Systems   Review of Systems  Gastrointestinal:  Positive for abdominal pain.    Physical Exam Updated Vital Signs BP 139/78 (BP Location: Right  Arm)   Pulse 96   Temp 98.6 F (37 C)   Resp 19   Ht 5\' 3"  (1.6 m)   Wt 88 kg   LMP  (LMP Unknown)   SpO2 98%   BMI 34.37 kg/m  Physical Exam Vitals and nursing note reviewed.  Constitutional:      General: She is not in acute distress.    Appearance: She is well-developed. She is not diaphoretic.  HENT:     Head: Normocephalic and atraumatic.     Right Ear: External ear normal.     Left Ear: External ear normal.     Nose: Nose normal.     Mouth/Throat:     Mouth: Mucous membranes are moist.  Eyes:     General: No scleral icterus.    Conjunctiva/sclera: Conjunctivae normal.  Cardiovascular:     Rate and Rhythm: Normal rate and regular rhythm.     Heart sounds: Normal heart sounds. No murmur heard.    No friction rub. No gallop.  Pulmonary:     Effort: Pulmonary effort is normal. No respiratory distress.     Breath sounds: Normal breath sounds.  Abdominal:     General: Bowel sounds are normal. There is distension.     Palpations: Abdomen is soft. There is no mass.     Tenderness: There is abdominal tenderness. There is no guarding.  Musculoskeletal:     Cervical back: Normal range of motion.  Skin:    General: Skin is warm and dry.  Neurological:     Mental Status: She is alert and oriented to person, place, and time.  Psychiatric:        Behavior: Behavior normal.     ED Results / Procedures / Treatments   Labs (all labs ordered are listed, but only abnormal results are displayed) Labs Reviewed  CBC - Abnormal; Notable for the following components:      Result Value   Platelets 121 (*)    All other components within normal limits  URINALYSIS, ROUTINE W REFLEX MICROSCOPIC - Abnormal; Notable for the following components:   Color, Urine ORANGE (*)    Hgb urine dipstick MODERATE (*)    Bilirubin Urine SMALL (*)    Ketones, ur 15 (*)    Protein, ur TRACE (*)    Nitrite POSITIVE (*)    Bacteria, UA RARE (*)    All other components within normal limits   URINE CULTURE  LIPASE, BLOOD  COMPREHENSIVE METABOLIC PANEL    EKG None  Radiology No results found.  Procedures Procedures    Medications Ordered in ED Medications  hyoscyamine (LEVSIN SL) SL tablet 0.125 mg (0.125 mg Sublingual Given 07/29/23 1525)  diazepam (VALIUM) injection 2.5 mg (2.5 mg Intravenous Given 07/29/23 1533)  fentaNYL (SUBLIMAZE) injection 50 mcg (50 mcg Intravenous Given 07/29/23 1532)    ED Course/ Medical Decision Making/ A&P Clinical Course as of 07/29/23 2338  Fri Jul 29, 2023  1501 Nitrite(!): POSITIVE [AH]  1734 CT ABDOMEN PELVIS W CONTRAST I visualized and interpreted CT abdomen pelvis which shows acute sigmoid diverticulitis [AH]  1754 CBC(!) [AH]  1754 WBC: 10.1 [AH]  1754 Comprehensive metabolic panel [AH]  1819 Patient reports a history of itching when taking penicillin in the past.  No history of anaphylaxis. [AH]    Clinical Course User Index [AH] Arthor Captain, PA-C  Medical Decision Making This patient presents to the ED for concern of abdominal pain , this involves an extensive number of treatment options, and is a complaint that carries with it a high risk of complications and morbidity.  The differential diagnosis for generalized abdominal pain includes, but is not limited to AAA, gastroenteritis, appendicitis, Bowel obstruction, Bowel perforation. Gastroparesis, DKA, Hernia, Inflammatory bowel disease, mesenteric ischemia, pancreatitis, peritonitis SBP, volvulus.    Co morbidities:       has a past medical history of Arthritis, Asthma, Autoimmune deficiency syndrome (HCC), Back pain, Cancer (HCC), Family history of adverse reaction to anesthesia, Fibromyalgia, GERD (gastroesophageal reflux disease), Hepatitis, Hiatal hernia, Hyperlipidemia, Hypertension, Hypothyroidism, Pneumonia, and S/P total knee arthroplasty (02/16/2016).   Social Determinants of Health:       SDOH Screenings Food  Insecurity: No Food Insecurity (11/04/2021) Transportation Needs: No Transportation Needs (11/04/2021) Depression (PHQ2-9): Low Risk  (07/17/2023) Social Connections: Unknown (01/15/2022)     Received from [facility], [facility] Tobacco Use: Medium Risk (07/29/2023)   Additional history:  {Additional history obtained from EMR   Lab Tests:  I Ordered, and personally interpreted labs.  The pertinent results include:   Plt 121 mild thrombocytopenia WBc wnl Remainder of labs wnl   Imaging Studies:  I ordered imaging studies including CT abd and pelvis I independently visualized and interpreted imaging which showed acute sigmoid diverticulitis I agree with the radiologist interpretation  Cardiac Monitoring/ECG:       The patient was maintained on a cardiac monitor.  I personally viewed and interpreted the cardiac monitored which showed an underlying rhythm of: nsr  Medicines ordered and prescription drug management:  I ordered medication including Medications hyoscyamine (LEVSIN SL) SL tablet 0.125 mg (0.125 mg Sublingual Given 07/29/23 1525) diazepam (VALIUM) injection 2.5 mg (2.5 mg Intravenous Given 07/29/23 1533) fentaNYL (SUBLIMAZE) injection 50 mcg (50 mcg Intravenous Given 07/29/23 1532) iohexol (OMNIPAQUE) 300 MG/ML solution 100 mL (100 mLs Intravenous Contrast Given 07/29/23 1619) amoxicillin-clavulanate (AUGMENTIN) 875-125 MG per tablet 1 tablet (1 tablet Oral Given 07/29/23 1811) hyoscyamine (LEVSIN SL) SL tablet 0.125 mg (0.125 mg Sublingual Given 07/29/23 1824) fentaNYL (SUBLIMAZE) injection 50 mcg (50 mcg Intravenous Given 07/29/23 1923) for abdominal pain and spasm Reevaluation of the patient after these medicines showed that the patient improved I have reviewed the patients home medicines and have made adjustments as needed  Test Considered:         Critical Interventions:      Abx and pain medications  Consultations Obtained:   Problem List / ED  Course:       (K57.92) Acute diverticulitis  (primary encounter diagnosis)   MDM: Patient with acute, uncomplicated diverticulitis.  I considered and offered the patient admission given her immunocompromise state and pain however patient declined admission and would like to go home.  In shared decision making feel that this is okay.  Patient was able to tolerate Augmentin without any allergic reactions.  Patient advised to stay on a clear liquid diet for the next several days.  I have discussed reasons to seek immediate medical care at the emergency department.  Patient understands reasons to return immediately. Dispostion:  After consideration of the diagnostic results and the patients response to treatment, I feel that the patent would benefit from discharge.    Amount and/or Complexity of Data Reviewed Labs: ordered. Decision-making details documented in ED Course. Radiology: ordered. Decision-making details documented in ED Course.  Risk Prescription drug management.  Final Clinical Impression(s) / ED Diagnoses Final diagnoses:  Acute diverticulitis    Rx / DC Orders ED Discharge Orders     None         Arthor Captain, PA-C 07/29/23 2348    Virgina Norfolk, DO 07/30/23 1843

## 2023-07-30 LAB — URINE CULTURE: Culture: NO GROWTH

## 2023-08-01 ENCOUNTER — Encounter: Payer: Self-pay | Admitting: Nurse Practitioner

## 2023-08-01 ENCOUNTER — Ambulatory Visit (INDEPENDENT_AMBULATORY_CARE_PROVIDER_SITE_OTHER): Payer: Medicare Other | Admitting: Nurse Practitioner

## 2023-08-01 VITALS — BP 118/70 | HR 87 | Temp 97.5°F | Ht 62.5 in | Wt 196.2 lb

## 2023-08-01 DIAGNOSIS — I10 Essential (primary) hypertension: Secondary | ICD-10-CM | POA: Diagnosis not present

## 2023-08-01 DIAGNOSIS — D696 Thrombocytopenia, unspecified: Secondary | ICD-10-CM | POA: Diagnosis not present

## 2023-08-01 DIAGNOSIS — K5792 Diverticulitis of intestine, part unspecified, without perforation or abscess without bleeding: Secondary | ICD-10-CM

## 2023-08-01 NOTE — Patient Instructions (Signed)
Diverticulitis  Diverticulitis happens when poop (stool) and bacteria get trapped in small pouches in the colon called diverticula. These pouches may form if you have a condition called diverticulosis. When the poop and bacteria get trapped, it can cause an infection and inflammation. Diverticulitis may cause severe stomach pain and diarrhea. It can also lead to tissue damage in your colon. This can cause bleeding or blockage. In some cases, the diverticula may burst (rupture). This can cause infected poop to go into other parts of your abdomen. What are the causes? This condition is caused by poop getting trapped in the diverticula. This allows bacteria to grow. It can lead to inflammation and infection. What increases the risk? You are more likely to get this condition if you have diverticulosis. You are also more at risk if: You are overweight or obese. You do not get enough exercise. You drink alcohol. You smoke. You eat a lot of red meat, such as beef, pork, or lamb. You do not get enough fiber. Foods high in fiber include fruits, vegetables, beans, nuts, and whole grains. You are over 73 years of age. What are the signs or symptoms? Symptoms of this condition may include: Pain and tenderness in the abdomen. This pain is often felt on the left side but may occur in other spots. Fever and chills. Nausea and vomiting. Cramping. Bloating. Changes in how often you poop. Blood in your poop. How is this diagnosed? This condition is diagnosed based on your medical history and a physical exam. You may also have tests done to make sure there is nothing else causing your condition. These tests may include: Blood tests. Tests done on your pee (urine). A CT scan of the abdomen. You may need to have a colonoscopy. This is an exam to look at your whole large intestine. During the exam, a tube is put into the opening of your butt (anus) and then moved into your rectum, colon, and other parts of  the large intestine. This exam is done to look at the diverticula. It can also see if there is something else that may be causing your symptoms. How is this treated? Most cases are mild and can be treated at home. You may be told to: Take over-the-counter pain medicine. Only eat and drink clear liquids. Take antibiotics. Rest. More severe cases may need to be treated at a hospital. Treatment may include: Not eating or drinking. Taking pain medicines. Getting antibiotics through an IV. Getting fluids and nutrition through an IV. Surgery. Follow these instructions at home: Medicines Take over-the-counter and prescription medicines only as told by your health care provider. These include fiber supplements, probiotics, and medicines to soften your poop (stool softeners). If you were prescribed antibiotics, take them as told by your provider. Do not stop using the antibiotic even if you start to feel better. Ask your provider if the medicine prescribed to you requires you to avoid driving or using machinery. Eating and drinking  Follow the diet told by your provider. You may need to only eat and drink liquids. After your symptoms get better, you may be able to return to a more normal diet. You may be told to eat at least 25 grams (25 g) of fiber each day. Fiber makes it easier to poop. Healthy sources of fiber include: Berries. One cup has 4-8 g of fiber. Beans or lentils. One-half cup has 5-8 g of fiber. Green vegetables. One cup has 4 g of fiber. Avoid eating red meat.  General instructions Do not use any products that contain nicotine or tobacco. These products include cigarettes, chewing tobacco, and vaping devices, such as e-cigarettes. If you need help quitting, ask your provider. Exercise for at least 30 minutes, 3 times a week. Exercise hard enough to raise your heart rate and break a sweat. Contact a health care provider if: Your pain gets worse. Your pooping does not go back to  normal. Your symptoms do not get better with treatment. Your symptoms get worse all of a sudden. You have a fever. You vomit more than one time. Your poop is bloody, black, or tarry. This information is not intended to replace advice given to you by your health care provider. Make sure you discuss any questions you have with your health care provider. Document Revised: 05/20/2022 Document Reviewed: 05/20/2022 Elsevier Patient Education  2024 [employer].

## 2023-08-02 LAB — CBC WITH DIFFERENTIAL/PLATELET
Absolute Lymphocytes: 3219 {cells}/uL (ref 850–3900)
Absolute Monocytes: 470 {cells}/uL (ref 200–950)
Basophils Absolute: 31 {cells}/uL (ref 0–200)
Basophils Relative: 0.4 %
Eosinophils Absolute: 39 {cells}/uL (ref 15–500)
Eosinophils Relative: 0.5 %
HCT: 41.5 % (ref 35.0–45.0)
Hemoglobin: 13.3 g/dL (ref 11.7–15.5)
MCH: 28.4 pg (ref 27.0–33.0)
MCHC: 32 g/dL (ref 32.0–36.0)
MCV: 88.7 fL (ref 80.0–100.0)
MPV: 11.3 fL (ref 7.5–12.5)
Monocytes Relative: 6.1 %
Neutro Abs: 3942 {cells}/uL (ref 1500–7800)
Neutrophils Relative %: 51.2 %
Platelets: 105 10*3/uL — ABNORMAL LOW (ref 140–400)
RBC: 4.68 10*6/uL (ref 3.80–5.10)
RDW: 13 % (ref 11.0–15.0)
Total Lymphocyte: 41.8 %
WBC: 7.7 10*3/uL (ref 3.8–10.8)

## 2023-08-09 NOTE — Progress Notes (Unsigned)
Assessment and Plan:  Autumn Frye was seen today for abdominal pain.  Diagnoses and all orders for this visit:  Low platelet count (HCC) Repeat CBC and will treat pending results -     CBC with Differential/Platelet - Path smear review  Essential hypertension - continue medications: Olmesartan 20 mg every day and Furosemide 40 mg once a week - continue DASH diet, exercise and monitor at home. Call if greater than 130/80.     Diverticulitis Improved Monitor diet and symptoms      Further disposition pending results of labs. Discussed med's effects and SE's.   Over 30 minutes of exam, counseling, chart review, and critical decision making was performed.   Future Appointments  Date Time Provider Department Center  10/20/2023  3:30 PM Raynelle Dick, NP [facility] None  01/26/2024  3:30 PM Lucky Cowboy, MD [facility] None  04/30/2024  3:30 PM Raynelle Dick, NP [facility] None  07/31/2024  2:00 PM Lucky Cowboy, MD [facility] None    ------------------------------------------------------------------------------------------------------------------   HPI BP 124/78   Pulse 97   Temp (!) 97.5 F (36.4 C)   Ht 5' 2.5" (1.588 m)   Wt 193 lb 3.2 oz (87.6 kg)   LMP  (LMP Unknown)   SpO2 96%   BMI 34.77 kg/m  62 y.o.female presents for diverticulitis follow up. She is having much less abdominal pain. No longer having constipation.    BP well controlled with olmesartan 20 mg every day Furosemide 40 mg once a week BP Readings from Last 3 Encounters:  08/10/23 124/78  08/01/23 118/70  07/29/23 124/78  Denies headaches, chest pain, shortness of breath and dizziness   BMI is Body mass index is 34.77 kg/m., she has been working on diet and exercise. Wt Readings from Last 3 Encounters:  08/10/23 193 lb 3.2 oz (87.6 kg)  08/01/23 196 lb 3.2 oz (89 kg)  07/29/23 194 lb 0.1 oz (88 kg)   She is on Repatha for hyperlipidemia. When she took last shot she did develop some  mild abdominal discomfort. Lab Results  Component Value Date   CHOL 194 07/14/2023   HDL 93 07/14/2023   LDLCALC 82 07/14/2023   TRIG 94 07/14/2023   CHOLHDL 2.1 07/14/2023      Past Medical History:  Diagnosis Date   Arthritis    OA, RA   Asthma    with a cold   Autoimmune deficiency syndrome (HCC)    RA   Back pain    Cancer (HCC)    thyroid-1995   Family history of adverse reaction to anesthesia    mother problems with n/v   Fibromyalgia    GERD (gastroesophageal reflux disease)    Hepatitis    'A" when pt. was 12 yrs. old   Hiatal hernia    Hyperlipidemia    Hypertension    Hypothyroidism    Pneumonia    S/P total knee arthroplasty 02/16/2016     Allergies  Allergen Reactions   Ciprofloxacin Hives and Swelling    SWELLING REACTION UNSPECIFIED    Levofloxacin Hives and Other (See Comments)   Lyrica [Pregabalin] Other (See Comments)    Altered mental status ALTERED MENTAL STATUS HALLUCINATIONS   Compazine [Prochlorperazine Edisylate]     UNSPECIFIED REACTION    Gabapentin Other (See Comments)    Diarrhea and nausea   Sulfamethoxazole-Trimethoprim Other (See Comments)    itch   Xeljanz [Tofacitinib] Hives   Zetia [Ezetimibe]     Stomach cramping   Hydrocodone  Itching   Morphine And Codeine Itching   Penicillins Itching   Sulfa Antibiotics Itching    Current Outpatient Medications on File Prior to Visit  Medication Sig   acetaminophen (TYLENOL) 500 MG tablet Take 1 tablet (500 mg total) by mouth every 6 (six) hours as needed.   Acetylcysteine (N-ACETYL-L-CYSTEINE PO) Take 1 capsule by mouth daily.   albuterol (PROVENTIL) (2.5 MG/3ML) 0.083% nebulizer solution Take 3 mLs (2.5 mg total) by nebulization every 6 (six) hours as needed for wheezing or shortness of breath.   albuterol (VENTOLIN HFA) 108 (90 Base) MCG/ACT inhaler INHALE 2 PUFFS INTO THE LUNGS EVERY 4 HOURS 15 MINUTES APART AS NEEDED FOR ASTHMA   amitriptyline (ELAVIL) 25 MG tablet TAKE 1  TABLET BY MOUTH EVERY MORNING AND 3 TABLETS BY MOUTH AT BEDTIME FOR CHRONIC PAIN   ASPIRIN 81 PO Take by mouth daily.   Black Pepper-Turmeric (TURMERIC CURCUMIN) 01-999 MG CAPS Takes 1 capsule  Daily   Cholecalciferol (VITAMIN D3) 5000 UNITS TABS Take 10,000 Units by mouth daily.    CRANBERRY PO Take 1 tablet by mouth 2 (two) times daily.   diclofenac Sodium (VOLTAREN) 1 % GEL Apply 2 g topically as needed (inflamation).   Eyelid Cleansers (AVENOVA) 0.01 % SOLN Place 1 application into both eyes 2 (two) times daily. Apply to eyelids BID   fexofenadine (ALLEGRA) 180 MG tablet Take 180 mg by mouth daily as needed.   fluticasone furoate-vilanterol (BREO ELLIPTA) 100-25 MCG/ACT AEPB INHALE 1 PUFF BY MOUTH DAILY.   furosemide (LASIX) 40 MG tablet TAKE 1 TABLET BY MOUTH ONCE A WEEK   Golimumab (SIMPONI ARIA IV) Inject 2 mg/kg into the vein every 8 (eight) weeks.   hyoscyamine (LEVSIN SL) 0.125 MG SL tablet Place 1 tablet (0.125 mg total) under the tongue every 4 (four) hours as needed.   levothyroxine (SYNTHROID) 125 MCG tablet Take  1 tablet  Daily  on an empty stomach with only water for 30 minutes & no Antacid meds, Calcium or Magnesium for 4 hours & avoid Biotin                                /                                                                   TAKE                                         BY                                                 MOUTH   LOTEMAX 0.5 % GEL INSTILL 1 DROP IN BOTH EYES THREE TIMES DAILY   Magnesium Oxide 500 MG TABS Take 1,000 mg by mouth daily.   Methocarbamol 1000 MG TABS Take 1,000 mg by mouth as needed.   MILK THISTLE EXTRACT PO Take 2 capsules by mouth daily.   montelukast (SINGULAIR) 10 MG  tablet TAKE 1 TABLET BY MOUTH DAILY FOR ALLERGIES   mupirocin ointment (BACTROBAN) 2 % APPLY EXTERNALLY TO THE AFFECTED AREA THREE TIMES DAILY FOR 10 DAYS   olmesartan (BENICAR) 20 MG tablet TAKE 1 TABLET BY MOUTH DAILY FOR BLOOD PRESSURE   Omega-3 Fatty Acids (FISH  OIL) 1200 MG CAPS Take 2 capsules by mouth daily.    REPATHA SURECLICK 140 MG/ML SOAJ ADMINISTER 1 ML UNDER THE SKIN EVERY 14 DAYS   RESTASIS 0.05 % ophthalmic emulsion Place 1 drop into both eyes daily.    rosuvastatin (CRESTOR) 40 MG tablet TAKE 1 TABLET BY MOUTH DAILY FOR CHOLESTEROL   triamcinolone (NASACORT) 55 MCG/ACT AERO nasal inhaler Place 2 sprays into the nose 2 (two) times daily.   vitamin B-12 (CYANOCOBALAMIN) 1000 MCG tablet Take 1,000 mcg by mouth daily.   amoxicillin-clavulanate (AUGMENTIN) 875-125 MG tablet Take 1 tablet by mouth every 12 (twelve) hours. (Patient not taking: Reported on 08/10/2023)   metoCLOPramide (REGLAN) 10 MG tablet Take 1 tablet (10 mg total) by mouth every 6 (six) hours as needed for nausea (nausea/headache). (Patient not taking: Reported on 08/01/2023)   oxyCODONE (ROXICODONE) 5 MG immediate release tablet Take 0.5-1 tablets (2.5-5 mg total) by mouth every 4 (four) hours as needed for severe pain (pain score 7-10). (Patient not taking: Reported on 08/01/2023)   terbinafine (LAMISIL) 250 MG tablet Take 1 tablet Daily for Toenail Fungus (Patient not taking: Reported on 08/01/2023)   tirzepatide (ZEPBOUND) 2.5 MG/0.5ML injection vial Inject 2.5 mg into the skin once a week. (Patient not taking: Reported on 08/01/2023)   No current facility-administered medications on file prior to visit.    ROS: all negative except above.   Physical Exam:  BP 124/78   Pulse 97   Temp (!) 97.5 F (36.4 C)   Ht 5' 2.5" (1.588 m)   Wt 193 lb 3.2 oz (87.6 kg)   LMP  (LMP Unknown)   SpO2 96%   BMI 34.77 kg/m   General Appearance: Well nourished, fatigued appearance Eyes: PERRLA, EOMs, conjunctiva no swelling or erythema Neck: Supple, thyroid normal.  Respiratory: Respiratory effort normal, BS equal bilaterally without rales, rhonchi, wheezing or stridor.  Cardio: RRR with no MRGs. Brisk peripheral pulses without edema.  Abdomen: Soft, + BS. Tenderness in LLQ has  improved.  Lymphatics: Non tender without lymphadenopathy.  Musculoskeletal: Full ROM, 5/5 strength, normal gait.  Skin: Warm, dry . Right toes appear to have toenail fungus   Neuro: Cranial nerves intact. Normal muscle tone, no cerebellar symptoms. Sensation intact.  Psych: Awake and oriented X 3, normal affect, Insight and Judgment appropriate.     Raynelle Dick, NP 2:43 PM [facility]

## 2023-08-10 ENCOUNTER — Encounter: Payer: Self-pay | Admitting: Nurse Practitioner

## 2023-08-10 ENCOUNTER — Ambulatory Visit (INDEPENDENT_AMBULATORY_CARE_PROVIDER_SITE_OTHER): Payer: Medicare Other | Admitting: Nurse Practitioner

## 2023-08-10 VITALS — BP 124/78 | HR 97 | Temp 97.5°F | Ht 62.5 in | Wt 193.2 lb

## 2023-08-10 DIAGNOSIS — I10 Essential (primary) hypertension: Secondary | ICD-10-CM | POA: Diagnosis not present

## 2023-08-10 DIAGNOSIS — K5792 Diverticulitis of intestine, part unspecified, without perforation or abscess without bleeding: Secondary | ICD-10-CM

## 2023-08-10 DIAGNOSIS — D696 Thrombocytopenia, unspecified: Secondary | ICD-10-CM

## 2023-08-10 NOTE — Patient Instructions (Signed)

## 2023-08-11 ENCOUNTER — Other Ambulatory Visit: Payer: Self-pay | Admitting: Nurse Practitioner

## 2023-08-11 DIAGNOSIS — D696 Thrombocytopenia, unspecified: Secondary | ICD-10-CM

## 2023-08-11 LAB — CBC WITH DIFFERENTIAL/PLATELET
Absolute Lymphocytes: 3947 {cells}/uL — ABNORMAL HIGH (ref 850–3900)
Absolute Monocytes: 460 {cells}/uL (ref 200–950)
Basophils Absolute: 31 {cells}/uL (ref 0–200)
Basophils Relative: 0.4 %
Eosinophils Absolute: 70 {cells}/uL (ref 15–500)
Eosinophils Relative: 0.9 %
HCT: 42.2 % (ref 35.0–45.0)
Hemoglobin: 13.8 g/dL (ref 11.7–15.5)
MCH: 28.6 pg (ref 27.0–33.0)
MCHC: 32.7 g/dL (ref 32.0–36.0)
MCV: 87.6 fL (ref 80.0–100.0)
MPV: 12.1 fL (ref 7.5–12.5)
Monocytes Relative: 5.9 %
Neutro Abs: 3292 {cells}/uL (ref 1500–7800)
Neutrophils Relative %: 42.2 %
Platelets: 30 10*3/uL — ABNORMAL LOW (ref 140–400)
RBC: 4.82 10*6/uL (ref 3.80–5.10)
RDW: 12.7 % (ref 11.0–15.0)
Total Lymphocyte: 50.6 %
WBC: 7.8 10*3/uL (ref 3.8–10.8)

## 2023-08-11 LAB — PATHOLOGIST SMEAR REVIEW

## 2023-08-11 NOTE — Progress Notes (Signed)
Addendum Patient's platelet level was normal at 345.  No concerning findings from morphology of WBC or platelet.  Differentials were normal.  I called twice but no answer.  Left message and communicated with PCP.  Patient may follow-up as needed.  Platelet CT in [facility] WBC/PLT in [facility] Collected: 08/12/23 1556  Result status: Final  Resulting lab: [facility]  Reference range: 150 - 400 K/uL  Value: 345    [facility] CONSULT NOTE  Patient Care Team: Lucky Cowboy, MD as PCP - General (Internal Medicine) Nahser, Deloris Ping, MD as PCP - Cardiology (Cardiology) Meryl Dare, MD as Consulting Physician (Gastroenterology) Nadara Mustard, MD as Consulting Physician (Orthopedic Surgery) Lurena Nida, MD as Consulting Physician (Rheumatology) Aliene Beams, MD as Consulting Physician (Neurosurgery) Cherlyn Roberts, MD as Consulting Physician (Dermatology) Cherlyn Roberts, MD as Consulting Physician (Dermatology) Charlett Nose, [facility] (Inactive) as Pharmacist (Pharmacist)  ASSESSMENT & PLAN 62 y.o.female with history of RA, hypertension, hypothyroidism, hyperlipidemia referred to Hematology for thrombocytopenia.  She presented with acute sigmoid colon diverticulitis on 11/22. Platelet mildly low. She was found to have platelet of 30 on 12/4. Smear showed platelet clump. There are some atypical lymphocytes reported consistent with recent infectious process. Per pathology: Platelet clumps noted on smear-count appears adequate.  Discussed this is likely pseudothrombocytopenia with platelet is not truly low but artifact on slide.  Will obtain citrate platelet with CBC B12, folate  No problem-specific Assessment & Plan notes found for this encounter.  Orders Placed This Encounter  Procedures   Vitamin B12    Standing Status:   Future    Number of Occurrences:   1    Standing Expiration Date:   08/10/2024   Folate    Standing Status:    Future    Number of Occurrences:   1    Standing Expiration Date:   08/10/2024   CBC with Differential (Cancer Center Only)    Standing Status:   Future    Number of Occurrences:   1    Standing Expiration Date:   08/10/2024   Pathologist smear review    Standing Status:   Future    Number of Occurrences:   1    Standing Expiration Date:   08/11/2024   CBC with Differential (Cancer Center Only)   WBC/PLT in [facility]    Melven Sartorius, MD 08/12/2023 5:13 PM  CHIEF COMPLAINTS/PURPOSE OF CONSULTATION:  Low platelet  HISTORY OF PRESENTING ILLNESS:  Autumn Frye 62 y.o. female is here because of thrombocytopenia.  Recent chart showed that patient was with diverticulitis in the emergency room in November 2024. Pain started a few day before presentation with pressure in the lower stomach, low grade fever and pain all over the stomach. She had macrobid and aza for UTI from UC before ED visit.  Some diarrhea but not bloody stool. She was given antibiotics at the time.  Platelet was normal in early November, and slightly low when she had infection with diverticulitis.  December 4, platelet was reported at 30.  Platelet count was reported.  She was referred here for further evaluation.  Path Review   Comment: Platelet clumps noted on smear-count appears adequate. Absolute lymphocytosis. Myeloid population consists predominantly of mature segmented neutrophils. Rare atypical lymphs. RBC are unremarkable.   She feels pain is better but had diarrhea 2 days ago after Taco, resolved. No fever or pain.  She reports bruise on her chest after cat walks over her.  No excess bruising. No hematuria, dark stool, blood nose of stool.   No petechiae.  Report eating well, balanced diet, fruits and vegetable. She is trying to loose weight.   She was taking aspirin and told to stop. No history of MI, CVA or stent. No ibuprofen or NSAIDs recently.    MEDICAL HISTORY:  Past Medical History:  Diagnosis Date    Arthritis    OA, RA   Asthma    with a cold   Autoimmune deficiency syndrome (HCC)    RA   Back pain    Cancer (HCC)    thyroid-1995   Family history of adverse reaction to anesthesia    mother problems with n/v   Fibromyalgia    GERD (gastroesophageal reflux disease)    Hepatitis    'A" when pt. was 12 yrs. old   Hiatal hernia    Hyperlipidemia    Hypertension    Hypothyroidism    Pneumonia    S/P total knee arthroplasty 02/16/2016    SURGICAL HISTORY: Past Surgical History:  Procedure Laterality Date   ANTERIOR CERVICAL DECOMP/DISCECTOMY FUSION N/A 05/30/2017   Procedure: Cervical five-six, Cervical six-seven Anterior discectomy with fusion and plate fixation;  Surgeon: Ditty, Loura Halt, MD;  Location: [facility] OR;  Service: Neurosurgery;  Laterality: N/A;   ANTERIOR LAT LUMBAR FUSION Left 01/26/2013   Procedure: ANTERIOR LATERAL LUMBAR FUSION 1 LEVEL;  Surgeon: Reinaldo Meeker, MD;  Location: [facility];  Service: Neurosurgery;  Laterality: Left;  lumbar four-five   KNEE ARTHROPLASTY Left 02/16/2016   Procedure: COMPUTER ASSISTED TOTAL KNEE ARTHROPLASTY;  Surgeon: Donato Heinz, MD;  Location: [facility] ORS;  Service: Orthopedics;  Laterality: Left;   KNEE SURGERY     3 on right knee, 1 on left-prior to TKR   LUMBAR PERCUTANEOUS PEDICLE SCREW 1 LEVEL Left 01/26/2013   Procedure: LUMBAR PERCUTANEOUS PEDICLE SCREW 1 LEVEL;  Surgeon: Reinaldo Meeker, MD;  Location: [facility];  Service: Neurosurgery;  Laterality: Left;  lumbar four-five   PILONIDAL CYST EXCISION     THYROIDECTOMY     TONSILLECTOMY     TOTAL KNEE ARTHROPLASTY     right   UPPER GASTROINTESTINAL ENDOSCOPY     VAGINAL HYSTERECTOMY      SOCIAL HISTORY: Social History   Socioeconomic History   Marital status: Married    Spouse name: Not on file   Number of children: Not on file   Years of education: Not on file   Highest education level: Not on file  Occupational History   Not on file  Tobacco Use    Smoking status: Former    Current packs/day: 0.00    Average packs/day: 0.8 packs/day for 40.0 years (30.0 ttl pk-yrs)    Types: Cigarettes    Start date: 07/05/1975    Quit date: 07/05/2015    Years since quitting: 8.1   Smokeless tobacco: Never  Vaping Use   Vaping status: Never Used  Substance and Sexual Activity   Alcohol use: Yes    Alcohol/week: 0.0 standard drinks of alcohol    Comment: rare   Drug use: No   Sexual activity: Not on file  Other Topics Concern   Not on file  Social History Narrative   Not on file   Social Determinants of Health   Financial Resource Strain: Not on file  Food Insecurity: No Food Insecurity (11/04/2021)   Hunger Vital Sign    Worried About Running Out of Food in the Last  Year: Never true    Ran Out of Food in the Last Year: Never true  Transportation Needs: No Transportation Needs (11/04/2021)   PRAPARE - Administrator, Civil Service (Medical): No    Lack of Transportation (Non-Medical): No  Physical Activity: Not on file  Stress: Not on file  Social Connections: Unknown (01/15/2022)   Received from [facility], [facility]   Social Network    Social Network: Not on file  Intimate Partner Violence: Unknown (12/07/2021)   Received from [facility], [facility]   HITS    Physically Hurt: Not on file    Insult or Talk Down To: Not on file    Threaten Physical Harm: Not on file    Scream or Curse: Not on file    FAMILY HISTORY: Family History  Problem Relation Age of Onset   Hypertension Father    Stroke Father 91   Heart attack Father 78   Lung cancer Father 48       smoker   Diabetes Brother    Asthma Brother    Other Daughter        XED CONNECTIVE TISSUE   Breast cancer Maternal Grandmother 66   Hypertension Maternal Grandmother    Stroke Paternal Grandmother        late in life   Heart attack Paternal Grandfather        late in life   Alzheimer's disease Paternal Grandfather        late onset   Colon  cancer Neg Hx    Esophageal cancer Neg Hx    Stomach cancer Neg Hx    Rectal cancer Neg Hx     ALLERGIES:  is allergic to ciprofloxacin, levofloxacin, lyrica [pregabalin], compazine [prochlorperazine edisylate], gabapentin, sulfamethoxazole-trimethoprim, xeljanz [tofacitinib], zetia [ezetimibe], hydrocodone, morphine and codeine, penicillins, and sulfa antibiotics.  MEDICATIONS:  Current Outpatient Medications  Medication Sig Dispense Refill   acetaminophen (TYLENOL) 500 MG tablet Take 1 tablet (500 mg total) by mouth every 6 (six) hours as needed. 30 tablet 0   Acetylcysteine (N-ACETYL-L-CYSTEINE PO) Take 1 capsule by mouth daily.     albuterol (PROVENTIL) (2.5 MG/3ML) 0.083% nebulizer solution Take 3 mLs (2.5 mg total) by nebulization every 6 (six) hours as needed for wheezing or shortness of breath. 60 vial 1   albuterol (VENTOLIN HFA) 108 (90 Base) MCG/ACT inhaler INHALE 2 PUFFS INTO THE LUNGS EVERY 4 HOURS 15 MINUTES APART AS NEEDED FOR ASTHMA 40.2 g 1   amitriptyline (ELAVIL) 25 MG tablet TAKE 1 TABLET BY MOUTH EVERY MORNING AND 3 TABLETS BY MOUTH AT BEDTIME FOR CHRONIC PAIN 360 tablet 3   ASPIRIN 81 PO Take by mouth daily.     Black Pepper-Turmeric (TURMERIC CURCUMIN) 01-999 MG CAPS Takes 1 capsule  Daily     Cholecalciferol (VITAMIN D3) 5000 UNITS TABS Take 10,000 Units by mouth daily.      CRANBERRY PO Take 1 tablet by mouth 2 (two) times daily.     diclofenac Sodium (VOLTAREN) 1 % GEL Apply 2 g topically as needed (inflamation).     Eyelid Cleansers (AVENOVA) 0.01 % SOLN Place 1 application into both eyes 2 (two) times daily. Apply to eyelids BID 1 Bottle 1   fexofenadine (ALLEGRA) 180 MG tablet Take 180 mg by mouth daily as needed.     fluticasone furoate-vilanterol (BREO ELLIPTA) 100-25 MCG/ACT AEPB INHALE 1 PUFF BY MOUTH DAILY. 60 each 3   furosemide (LASIX) 40 MG tablet TAKE 1 TABLET BY  MOUTH ONCE A WEEK 30 tablet 2   Golimumab (SIMPONI ARIA IV) Inject 2 mg/kg into the vein  every 8 (eight) weeks.     hyoscyamine (LEVSIN SL) 0.125 MG SL tablet Place 1 tablet (0.125 mg total) under the tongue every 4 (four) hours as needed. 30 tablet 0   levothyroxine (SYNTHROID) 125 MCG tablet Take  1 tablet  Daily  on an empty stomach with only water for 30 minutes & no Antacid meds, Calcium or Magnesium for 4 hours & avoid Biotin                                /                                                                   TAKE                                         BY                                                 MOUTH 90 tablet 3   LOTEMAX 0.5 % GEL INSTILL 1 DROP IN BOTH EYES THREE TIMES DAILY 5 g 0   Magnesium Oxide 500 MG TABS Take 1,000 mg by mouth daily.     Methocarbamol 1000 MG TABS Take 1,000 mg by mouth as needed. 30 tablet 1   MILK THISTLE EXTRACT PO Take 2 capsules by mouth daily.     montelukast (SINGULAIR) 10 MG tablet TAKE 1 TABLET BY MOUTH DAILY FOR ALLERGIES 90 tablet 3   mupirocin ointment (BACTROBAN) 2 % APPLY EXTERNALLY TO THE AFFECTED AREA THREE TIMES DAILY FOR 10 DAYS 22 g 3   olmesartan (BENICAR) 20 MG tablet TAKE 1 TABLET BY MOUTH DAILY FOR BLOOD PRESSURE 90 tablet 1   Omega-3 Fatty Acids (FISH OIL) 1200 MG CAPS Take 2 capsules by mouth daily.      REPATHA SURECLICK 140 MG/ML SOAJ ADMINISTER 1 ML UNDER THE SKIN EVERY 14 DAYS 6 mL 3   RESTASIS 0.05 % ophthalmic emulsion Place 1 drop into both eyes daily.   3   rosuvastatin (CRESTOR) 40 MG tablet TAKE 1 TABLET BY MOUTH DAILY FOR CHOLESTEROL 90 tablet 1   triamcinolone (NASACORT) 55 MCG/ACT AERO nasal inhaler Place 2 sprays into the nose 2 (two) times daily. 1 each 2   vitamin B-12 (CYANOCOBALAMIN) 1000 MCG tablet Take 1,000 mcg by mouth daily.     metoCLOPramide (REGLAN) 10 MG tablet Take 1 tablet (10 mg total) by mouth every 6 (six) hours as needed for nausea (nausea/headache). (Patient not taking: Reported on 08/01/2023) 6 tablet 0   terbinafine (LAMISIL) 250 MG tablet Take 1 tablet Daily for Toenail  Fungus (Patient not taking: Reported on 08/01/2023) 90 tablet 0   tirzepatide (ZEPBOUND) 2.5 MG/0.5ML injection vial Inject 2.5 mg into the skin once a week. (Patient not taking: Reported on 08/01/2023) 2 mL 11  No current facility-administered medications for this visit.    REVIEW OF SYSTEMS:   All relevant systems were reviewed with the patient and are negative.  PHYSICAL EXAMINATION:  Vitals:   08/12/23 1050  BP: 139/68  Pulse: 80  Resp: 20  Temp: (!) 97.3 F (36.3 C)  SpO2: 98%   Filed Weights   08/12/23 1050  Weight: 192 lb 1.6 oz (87.1 kg)    GENERAL: alert, no distress and comfortable SKIN: skin color normal and only one visible bruising over left lower leg. No petechiae on exposed skin over extremities EYES: normal, sclera clear OROPHARYNX: no exudate  NECK: No palpable mass LYMPH:  no palpable cervical lymphadenopathy  LUNGS: clear to auscultation and percussion with normal breathing effort HEART: regular rate & rhythm and no murmurs  ABDOMEN: abdomen soft, non-tender and nondistended. Musculoskeletal: no edema  LABORATORY DATA:  I have reviewed the data as listed  RADIOGRAPHIC STUDIES: I have personally reviewed the radiological images as listed and agreed with the findings in the report. CT ABDOMEN PELVIS W CONTRAST  Result Date: 07/29/2023 CLINICAL DATA:  Abdominal pain EXAM: CT ABDOMEN AND PELVIS WITH CONTRAST TECHNIQUE: Multidetector CT imaging of the abdomen and pelvis was performed using the standard protocol following bolus administration of intravenous contrast. RADIATION DOSE REDUCTION: This exam was performed according to the departmental dose-optimization program which includes automated exposure control, adjustment of the mA and/or kV according to patient size and/or use of iterative reconstruction technique. CONTRAST:  OMNIPAQUE IOHEXOL 300 MG/ML  SOLN COMPARISON:  CT abdomen and pelvis 06/22/2018 FINDINGS: Lower chest: No acute abnormality.  Hepatobiliary: No focal liver abnormality is seen. No gallstones, gallbladder wall thickening, or biliary dilatation. Pancreas: Unremarkable. No pancreatic ductal dilatation or surrounding inflammatory changes. Spleen: Normal in size without focal abnormality. Adrenals/Urinary Tract: Adrenal glands are unremarkable. Kidneys are normal, without renal calculi, focal lesion, or hydronephrosis. Bladder is unremarkable. Stomach/Bowel: There sigmoid colon diverticulosis. There is wall thickening of the mid sigmoid colon with surrounding inflammation compatible with acute diverticulitis. There is no evidence for perforation or abscess. There is no bowel obstruction. The appendix, small bowel and stomach are within normal limits. Vascular/Lymphatic: No acute vascular findings. No enlarged lymph nodes are seen. Reproductive: Status post hysterectomy. No adnexal masses. Other: There is a small amount of free fluid in the pelvis. No focal abdominal wall hernia. Musculoskeletal: L4-L5 posterior fusion hardware is present. Thoracic spinal cord stimulator device is present. IMPRESSION: 1. Acute sigmoid colon diverticulitis. No evidence for perforation or abscess. 2. Small amount of free fluid in the pelvis. Electronically Signed   By: Darliss Cheney M.D.   On: 07/29/2023 17:20

## 2023-08-12 ENCOUNTER — Inpatient Hospital Stay: Payer: Medicare Other

## 2023-08-12 VITALS — BP 139/68 | HR 80 | Temp 97.3°F | Resp 20 | Wt 192.1 lb

## 2023-08-12 DIAGNOSIS — Z801 Family history of malignant neoplasm of trachea, bronchus and lung: Secondary | ICD-10-CM | POA: Diagnosis not present

## 2023-08-12 DIAGNOSIS — Z803 Family history of malignant neoplasm of breast: Secondary | ICD-10-CM | POA: Diagnosis not present

## 2023-08-12 DIAGNOSIS — D696 Thrombocytopenia, unspecified: Secondary | ICD-10-CM | POA: Diagnosis not present

## 2023-08-12 DIAGNOSIS — Z1211 Encounter for screening for malignant neoplasm of colon: Secondary | ICD-10-CM

## 2023-08-12 DIAGNOSIS — Z87891 Personal history of nicotine dependence: Secondary | ICD-10-CM | POA: Diagnosis not present

## 2023-08-12 DIAGNOSIS — K5732 Diverticulitis of large intestine without perforation or abscess without bleeding: Secondary | ICD-10-CM | POA: Insufficient documentation

## 2023-08-12 LAB — CBC WITH DIFFERENTIAL (CANCER CENTER ONLY)
Abs Immature Granulocytes: 0.01 10*3/uL (ref 0.00–0.07)
Abs Immature Granulocytes: 0.02 10*3/uL (ref 0.00–0.07)
Basophils Absolute: 0 10*3/uL (ref 0.0–0.1)
Basophils Absolute: 0 10*3/uL (ref 0.0–0.1)
Basophils Relative: 1 %
Basophils Relative: 1 %
Eosinophils Absolute: 0.1 10*3/uL (ref 0.0–0.5)
Eosinophils Absolute: 0.1 10*3/uL (ref 0.0–0.5)
Eosinophils Relative: 1 %
Eosinophils Relative: 1 %
HCT: 45 % (ref 36.0–46.0)
HCT: 41.1 % (ref 36.0–46.0)
Hemoglobin: 14.3 g/dL (ref 12.0–15.0)
Hemoglobin: 13.1 g/dL (ref 12.0–15.0)
Immature Granulocytes: 0 %
Immature Granulocytes: 0 %
Lymphocytes Relative: 55 %
Lymphocytes Relative: 47 %
Lymphs Abs: 3.2 10*3/uL (ref 0.7–4.0)
Lymphs Abs: 3.9 10*3/uL (ref 0.7–4.0)
MCH: 28.3 pg (ref 26.0–34.0)
MCH: 28.7 pg (ref 26.0–34.0)
MCHC: 31.8 g/dL (ref 30.0–36.0)
MCHC: 31.9 g/dL (ref 30.0–36.0)
MCV: 88.9 fL (ref 80.0–100.0)
MCV: 90.1 fL (ref 80.0–100.0)
Monocytes Absolute: 0.4 10*3/uL (ref 0.1–1.0)
Monocytes Absolute: 0.6 10*3/uL (ref 0.1–1.0)
Monocytes Relative: 7 %
Monocytes Relative: 7 %
Neutro Abs: 2.1 10*3/uL (ref 1.7–7.7)
Neutro Abs: 3.7 10*3/uL (ref 1.7–7.7)
Neutrophils Relative %: 36 %
Neutrophils Relative %: 44 %
Platelet Count: ADEQUATE 10*3/uL (ref 150–400)
RBC: 5.06 MIL/uL (ref 3.87–5.11)
RBC: 4.56 MIL/uL (ref 3.87–5.11)
RDW: 13.8 % (ref 11.5–15.5)
RDW: 13.9 % (ref 11.5–15.5)
Smear Review: ADEQUATE
WBC Count: 5.8 10*3/uL (ref 4.0–10.5)
nRBC: 0 % (ref 0.0–0.2)
nRBC: 0 % (ref 0.0–0.2)

## 2023-08-12 LAB — WBC/PLT IN CITRATE
Platelet CT in Citrate: 345 10*3/uL (ref 150–400)
WBC in Citrate: 8.3 10*3/uL (ref 4.0–10.5)

## 2023-08-12 LAB — VITAMIN B12: Vitamin B-12: 559 pg/mL (ref 180–914)

## 2023-08-12 LAB — FOLATE: Folate: 9.9 ng/mL (ref >5.9)

## 2023-08-15 ENCOUNTER — Telehealth: Payer: Self-pay | Admitting: Nurse Practitioner

## 2023-08-15 NOTE — Telephone Encounter (Signed)
spoke to patient regarding lab results. She understands results. Patient states that her RA is the cause of abnormal labs and is due for infusion next week.  Patient requests that at next office visit with labs that this specific lab is done; WBC in [facility] due to clumping issue. This maybe a specialized lab done at hematology. She was recently referred to hematology, with normal range platelets, no follow up needed.

## 2023-08-18 DIAGNOSIS — M0609 Rheumatoid arthritis without rheumatoid factor, multiple sites: Secondary | ICD-10-CM | POA: Diagnosis not present

## 2023-08-24 DIAGNOSIS — E119 Type 2 diabetes mellitus without complications: Secondary | ICD-10-CM | POA: Diagnosis not present

## 2023-08-24 DIAGNOSIS — M3501 Sicca syndrome with keratoconjunctivitis: Secondary | ICD-10-CM | POA: Diagnosis not present

## 2023-08-24 DIAGNOSIS — H43812 Vitreous degeneration, left eye: Secondary | ICD-10-CM | POA: Diagnosis not present

## 2023-08-25 ENCOUNTER — Inpatient Hospital Stay: Payer: Medicare Other

## 2023-08-26 ENCOUNTER — Inpatient Hospital Stay: Payer: Medicare Other

## 2023-08-29 ENCOUNTER — Other Ambulatory Visit: Payer: Self-pay | Admitting: Nurse Practitioner

## 2023-08-29 DIAGNOSIS — J45909 Unspecified asthma, uncomplicated: Secondary | ICD-10-CM

## 2023-10-15 ENCOUNTER — Other Ambulatory Visit: Payer: Self-pay | Admitting: Internal Medicine

## 2023-10-19 ENCOUNTER — Other Ambulatory Visit: Payer: Self-pay

## 2023-10-19 DIAGNOSIS — E782 Mixed hyperlipidemia: Secondary | ICD-10-CM

## 2023-10-19 DIAGNOSIS — M0609 Rheumatoid arthritis without rheumatoid factor, multiple sites: Secondary | ICD-10-CM | POA: Diagnosis not present

## 2023-10-19 MED ORDER — OLMESARTAN MEDOXOMIL 20 MG PO TABS: ORAL_TABLET | ORAL | 0 refills | Status: AC

## 2023-10-19 MED ORDER — ROSUVASTATIN CALCIUM 40 MG PO TABS: ORAL_TABLET | ORAL | 0 refills | Status: AC

## 2023-10-20 ENCOUNTER — Ambulatory Visit: Payer: Medicare Other | Admitting: Nurse Practitioner

## 2023-10-28 DIAGNOSIS — H04123 Dry eye syndrome of bilateral lacrimal glands: Secondary | ICD-10-CM | POA: Diagnosis not present

## 2023-10-28 DIAGNOSIS — M7702 Medial epicondylitis, left elbow: Secondary | ICD-10-CM | POA: Diagnosis not present

## 2023-10-28 DIAGNOSIS — M797 Fibromyalgia: Secondary | ICD-10-CM | POA: Diagnosis not present

## 2023-10-28 DIAGNOSIS — E039 Hypothyroidism, unspecified: Secondary | ICD-10-CM | POA: Diagnosis not present

## 2023-10-28 DIAGNOSIS — Z79899 Other long term (current) drug therapy: Secondary | ICD-10-CM | POA: Diagnosis not present

## 2023-10-28 DIAGNOSIS — M1990 Unspecified osteoarthritis, unspecified site: Secondary | ICD-10-CM | POA: Diagnosis not present

## 2023-10-28 DIAGNOSIS — M549 Dorsalgia, unspecified: Secondary | ICD-10-CM | POA: Diagnosis not present

## 2023-10-28 DIAGNOSIS — M0609 Rheumatoid arthritis without rheumatoid factor, multiple sites: Secondary | ICD-10-CM | POA: Diagnosis not present

## 2023-10-31 DIAGNOSIS — R6883 Chills (without fever): Secondary | ICD-10-CM | POA: Diagnosis not present

## 2023-10-31 DIAGNOSIS — R059 Cough, unspecified: Secondary | ICD-10-CM | POA: Diagnosis not present

## 2023-10-31 DIAGNOSIS — M791 Myalgia, unspecified site: Secondary | ICD-10-CM | POA: Diagnosis not present

## 2023-10-31 DIAGNOSIS — R0981 Nasal congestion: Secondary | ICD-10-CM | POA: Diagnosis not present

## 2023-12-18 ENCOUNTER — Other Ambulatory Visit: Payer: Self-pay | Admitting: Family

## 2023-12-18 DIAGNOSIS — E039 Hypothyroidism, unspecified: Secondary | ICD-10-CM

## 2023-12-28 ENCOUNTER — Encounter: Payer: Self-pay | Admitting: Cardiovascular Disease

## 2023-12-28 NOTE — Progress Notes (Signed)
 No show  This encounter was created in error - please disregard.

## 2023-12-29 ENCOUNTER — Ambulatory Visit: Attending: Cardiovascular Disease | Admitting: Cardiovascular Disease

## 2024-01-26 ENCOUNTER — Other Ambulatory Visit: Payer: Self-pay | Admitting: Obstetrics and Gynecology

## 2024-01-26 ENCOUNTER — Ambulatory Visit: Payer: Medicare Other | Admitting: Internal Medicine

## 2024-01-26 DIAGNOSIS — Z1231 Encounter for screening mammogram for malignant neoplasm of breast: Secondary | ICD-10-CM

## 2024-01-27 ENCOUNTER — Ambulatory Visit
Admission: RE | Admit: 2024-01-27 | Discharge: 2024-01-27 | Disposition: A | Discharge status: Home / Self Care | Source: Ambulatory Visit | Attending: Obstetrics and Gynecology | Admitting: Obstetrics and Gynecology

## 2024-01-27 DIAGNOSIS — Z1231 Encounter for screening mammogram for malignant neoplasm of breast: Secondary | ICD-10-CM

## 2024-03-08 ENCOUNTER — Ambulatory Visit: Payer: Medicare Other | Admitting: Nurse Practitioner

## 2024-03-13 ENCOUNTER — Ambulatory Visit: Payer: Medicare Other | Admitting: Nurse Practitioner

## 2024-03-28 ENCOUNTER — Ambulatory Visit: Payer: Medicare Other | Admitting: Nurse Practitioner

## 2024-04-02 ENCOUNTER — Ambulatory Visit: Payer: Medicare Other | Admitting: Nurse Practitioner

## 2024-04-14 ENCOUNTER — Other Ambulatory Visit: Payer: Self-pay | Admitting: Nurse Practitioner

## 2024-04-19 ENCOUNTER — Other Ambulatory Visit: Payer: Self-pay

## 2024-04-19 DIAGNOSIS — M25421 Effusion, right elbow: Secondary | ICD-10-CM

## 2024-04-19 DIAGNOSIS — M25521 Pain in right elbow: Secondary | ICD-10-CM

## 2024-04-30 ENCOUNTER — Ambulatory Visit: Payer: Medicare Other | Admitting: Nurse Practitioner

## 2024-06-21 ENCOUNTER — Ambulatory Visit
Admission: RE | Admit: 2024-06-21 | Discharge: 2024-06-21 | Disposition: A | Discharge status: Home / Self Care | Source: Ambulatory Visit

## 2024-06-21 DIAGNOSIS — M25521 Pain in right elbow: Secondary | ICD-10-CM

## 2024-06-21 DIAGNOSIS — M25421 Effusion, right elbow: Secondary | ICD-10-CM

## 2024-07-20 ENCOUNTER — Other Ambulatory Visit: Payer: Self-pay | Admitting: Family

## 2024-07-20 DIAGNOSIS — E782 Mixed hyperlipidemia: Secondary | ICD-10-CM

## 2024-07-31 ENCOUNTER — Encounter: Payer: Medicare Other | Admitting: Internal Medicine

## 2024-08-15 ENCOUNTER — Ambulatory Visit
Admission: RE | Admit: 2024-08-15 | Discharge: 2024-08-15 | Disposition: A | Discharge status: Home / Self Care | Source: Ambulatory Visit | Attending: Family Medicine | Admitting: Family Medicine

## 2024-08-15 ENCOUNTER — Other Ambulatory Visit: Payer: Self-pay | Admitting: Family Medicine

## 2024-08-15 ENCOUNTER — Encounter: Payer: Self-pay | Admitting: Family Medicine

## 2024-08-15 DIAGNOSIS — R1030 Lower abdominal pain, unspecified: Secondary | ICD-10-CM

## 2024-08-16 ENCOUNTER — Encounter: Payer: Self-pay | Admitting: Family Medicine

## 2024-08-16 DIAGNOSIS — R1030 Lower abdominal pain, unspecified: Secondary | ICD-10-CM
